# Patient Record
Sex: Female | Born: 1935 | Race: White | Hispanic: No | Smoking: Never smoker
Health system: Southern US, Community
[De-identification: ages and names within clinical notes are randomized; demographics above are authoritative.]

## PROBLEM LIST (undated history)

## (undated) DIAGNOSIS — J449 Chronic obstructive pulmonary disease, unspecified: Secondary | ICD-10-CM

## (undated) DIAGNOSIS — R011 Cardiac murmur, unspecified: Secondary | ICD-10-CM

## (undated) DIAGNOSIS — J4489 Other specified chronic obstructive pulmonary disease: Secondary | ICD-10-CM

## (undated) DIAGNOSIS — I341 Nonrheumatic mitral (valve) prolapse: Secondary | ICD-10-CM

## (undated) DIAGNOSIS — E039 Hypothyroidism, unspecified: Secondary | ICD-10-CM

## (undated) DIAGNOSIS — J45909 Unspecified asthma, uncomplicated: Secondary | ICD-10-CM

## (undated) DIAGNOSIS — I639 Cerebral infarction, unspecified: Secondary | ICD-10-CM

## (undated) DIAGNOSIS — M1990 Unspecified osteoarthritis, unspecified site: Secondary | ICD-10-CM

## (undated) DIAGNOSIS — R112 Nausea with vomiting, unspecified: Secondary | ICD-10-CM

## (undated) DIAGNOSIS — E785 Hyperlipidemia, unspecified: Secondary | ICD-10-CM

## (undated) DIAGNOSIS — R350 Frequency of micturition: Secondary | ICD-10-CM

## (undated) DIAGNOSIS — I350 Nonrheumatic aortic (valve) stenosis: Secondary | ICD-10-CM

## (undated) DIAGNOSIS — Z8489 Family history of other specified conditions: Secondary | ICD-10-CM

## (undated) DIAGNOSIS — G43909 Migraine, unspecified, not intractable, without status migrainosus: Secondary | ICD-10-CM

## (undated) DIAGNOSIS — Z9889 Other specified postprocedural states: Secondary | ICD-10-CM

## (undated) DIAGNOSIS — I447 Left bundle-branch block, unspecified: Secondary | ICD-10-CM

## (undated) DIAGNOSIS — I2510 Atherosclerotic heart disease of native coronary artery without angina pectoris: Secondary | ICD-10-CM

## (undated) DIAGNOSIS — G309 Alzheimer's disease, unspecified: Secondary | ICD-10-CM

## (undated) DIAGNOSIS — D649 Anemia, unspecified: Secondary | ICD-10-CM

## (undated) DIAGNOSIS — I10 Essential (primary) hypertension: Secondary | ICD-10-CM

## (undated) DIAGNOSIS — I455 Other specified heart block: Secondary | ICD-10-CM

## (undated) DIAGNOSIS — F0280 Dementia in other diseases classified elsewhere without behavioral disturbance: Secondary | ICD-10-CM

## (undated) DIAGNOSIS — I441 Atrioventricular block, second degree: Secondary | ICD-10-CM

## (undated) DIAGNOSIS — R0602 Shortness of breath: Secondary | ICD-10-CM

## (undated) DIAGNOSIS — Z950 Presence of cardiac pacemaker: Secondary | ICD-10-CM

## (undated) DIAGNOSIS — I714 Abdominal aortic aneurysm, without rupture: Secondary | ICD-10-CM

## (undated) HISTORY — DX: Atrioventricular block, second degree: I44.1

## (undated) HISTORY — PX: CATARACT EXTRACTION W/ INTRAOCULAR LENS IMPLANT: SHX1309

## (undated) HISTORY — DX: Nonrheumatic aortic (valve) stenosis: I35.0

## (undated) HISTORY — DX: Abdominal aortic aneurysm, without rupture: I71.4

## (undated) HISTORY — PX: REFRACTIVE SURGERY: SHX103

## (undated) HISTORY — DX: Left bundle-branch block, unspecified: I44.7

## (undated) HISTORY — DX: Other specified heart block: I45.5

## (undated) HISTORY — PX: CARDIAC CATHETERIZATION: SHX172

## (undated) HISTORY — DX: Hyperlipidemia, unspecified: E78.5

## (undated) HISTORY — PX: APPENDECTOMY: SHX54

## (undated) HISTORY — PX: UTERINE FIBROID SURGERY: SHX826

---

## 1975-10-26 HISTORY — PX: ABDOMINAL HYSTERECTOMY: SHX81

## 1992-10-25 HISTORY — PX: RETINAL DETACHMENT SURGERY: SHX105

## 1998-03-21 ENCOUNTER — Ambulatory Visit (HOSPITAL_COMMUNITY): Admission: RE | Admit: 1998-03-21 | Discharge: 1998-03-21 | Payer: Self-pay | Admitting: Ophthalmology

## 1998-06-12 ENCOUNTER — Encounter: Admission: RE | Admit: 1998-06-12 | Discharge: 1998-09-10 | Payer: Self-pay | Admitting: Family Medicine

## 1999-05-12 ENCOUNTER — Encounter: Payer: Self-pay | Admitting: Family Medicine

## 1999-05-12 ENCOUNTER — Ambulatory Visit (HOSPITAL_COMMUNITY): Admission: RE | Admit: 1999-05-12 | Discharge: 1999-05-12 | Payer: Self-pay | Admitting: Family Medicine

## 1999-05-15 ENCOUNTER — Emergency Department (HOSPITAL_COMMUNITY): Admission: EM | Admit: 1999-05-15 | Discharge: 1999-05-15 | Payer: Self-pay | Admitting: Emergency Medicine

## 1999-05-15 ENCOUNTER — Encounter: Payer: Self-pay | Admitting: Emergency Medicine

## 1999-09-16 ENCOUNTER — Encounter: Payer: Self-pay | Admitting: Family Medicine

## 1999-09-16 ENCOUNTER — Encounter: Admission: RE | Admit: 1999-09-16 | Discharge: 1999-09-16 | Payer: Self-pay | Admitting: Family Medicine

## 2000-11-10 ENCOUNTER — Encounter: Payer: Self-pay | Admitting: Family Medicine

## 2000-11-10 ENCOUNTER — Encounter: Admission: RE | Admit: 2000-11-10 | Discharge: 2000-11-10 | Payer: Self-pay | Admitting: Family Medicine

## 2000-11-14 ENCOUNTER — Encounter: Payer: Self-pay | Admitting: Family Medicine

## 2000-11-14 ENCOUNTER — Encounter: Admission: RE | Admit: 2000-11-14 | Discharge: 2000-11-14 | Payer: Self-pay | Admitting: Family Medicine

## 2001-11-10 ENCOUNTER — Encounter: Admission: RE | Admit: 2001-11-10 | Discharge: 2001-11-10 | Payer: Self-pay | Admitting: Gynecology

## 2001-11-10 ENCOUNTER — Encounter: Payer: Self-pay | Admitting: Gynecology

## 2004-10-20 ENCOUNTER — Emergency Department (HOSPITAL_COMMUNITY): Admission: EM | Admit: 2004-10-20 | Discharge: 2004-10-20 | Payer: Self-pay | Admitting: Emergency Medicine

## 2004-11-30 ENCOUNTER — Ambulatory Visit: Payer: Self-pay | Admitting: Gastroenterology

## 2004-12-14 ENCOUNTER — Ambulatory Visit: Payer: Self-pay | Admitting: Gastroenterology

## 2005-12-15 ENCOUNTER — Encounter: Admission: RE | Admit: 2005-12-15 | Discharge: 2005-12-15 | Payer: Self-pay | Admitting: Family Medicine

## 2006-06-14 ENCOUNTER — Encounter: Admission: RE | Admit: 2006-06-14 | Discharge: 2006-06-14 | Payer: Self-pay | Admitting: Family Medicine

## 2006-12-11 ENCOUNTER — Emergency Department (HOSPITAL_COMMUNITY): Admission: EM | Admit: 2006-12-11 | Discharge: 2006-12-11 | Payer: Self-pay | Admitting: Emergency Medicine

## 2006-12-22 ENCOUNTER — Ambulatory Visit: Payer: Self-pay | Admitting: Vascular Surgery

## 2008-02-23 ENCOUNTER — Encounter: Admission: RE | Admit: 2008-02-23 | Discharge: 2008-02-23 | Payer: Self-pay | Admitting: Family Medicine

## 2008-03-20 ENCOUNTER — Encounter: Admission: RE | Admit: 2008-03-20 | Discharge: 2008-03-20 | Payer: Self-pay | Admitting: Cardiology

## 2008-03-25 DIAGNOSIS — I639 Cerebral infarction, unspecified: Secondary | ICD-10-CM

## 2008-03-25 HISTORY — DX: Cerebral infarction, unspecified: I63.9

## 2008-03-26 ENCOUNTER — Inpatient Hospital Stay (HOSPITAL_COMMUNITY): Admission: AD | Admit: 2008-03-26 | Discharge: 2008-04-03 | Payer: Self-pay | Admitting: Cardiology

## 2008-03-26 ENCOUNTER — Ambulatory Visit: Payer: Self-pay | Admitting: Thoracic Surgery (Cardiothoracic Vascular Surgery)

## 2008-03-27 ENCOUNTER — Encounter: Payer: Self-pay | Admitting: Thoracic Surgery (Cardiothoracic Vascular Surgery)

## 2008-03-29 ENCOUNTER — Encounter: Payer: Self-pay | Admitting: Thoracic Surgery (Cardiothoracic Vascular Surgery)

## 2008-03-29 DIAGNOSIS — I350 Nonrheumatic aortic (valve) stenosis: Secondary | ICD-10-CM

## 2008-03-29 HISTORY — PX: AORTIC VALVE REPLACEMENT: SHX41

## 2008-03-29 HISTORY — DX: Nonrheumatic aortic (valve) stenosis: I35.0

## 2008-03-29 HISTORY — PX: CORONARY ARTERY BYPASS GRAFT: SHX141

## 2008-04-10 ENCOUNTER — Encounter (INDEPENDENT_AMBULATORY_CARE_PROVIDER_SITE_OTHER): Payer: Self-pay | Admitting: Internal Medicine

## 2008-04-10 ENCOUNTER — Inpatient Hospital Stay (HOSPITAL_COMMUNITY): Admission: EM | Admit: 2008-04-10 | Discharge: 2008-04-15 | Payer: Self-pay | Admitting: Emergency Medicine

## 2008-04-12 ENCOUNTER — Encounter: Payer: Self-pay | Admitting: Cardiovascular Disease

## 2008-04-25 ENCOUNTER — Ambulatory Visit: Payer: Self-pay | Admitting: Cardiothoracic Surgery

## 2008-04-28 ENCOUNTER — Inpatient Hospital Stay (HOSPITAL_COMMUNITY): Admission: EM | Admit: 2008-04-28 | Discharge: 2008-04-29 | Payer: Self-pay | Admitting: Emergency Medicine

## 2008-04-28 ENCOUNTER — Ambulatory Visit: Payer: Self-pay | Admitting: Surgery

## 2008-05-13 ENCOUNTER — Encounter
Admission: RE | Admit: 2008-05-13 | Discharge: 2008-05-13 | Payer: Self-pay | Admitting: Thoracic Surgery (Cardiothoracic Vascular Surgery)

## 2008-05-13 ENCOUNTER — Ambulatory Visit: Payer: Self-pay | Admitting: Thoracic Surgery (Cardiothoracic Vascular Surgery)

## 2008-06-03 ENCOUNTER — Ambulatory Visit: Payer: Self-pay | Admitting: Thoracic Surgery (Cardiothoracic Vascular Surgery)

## 2008-06-03 ENCOUNTER — Encounter
Admission: RE | Admit: 2008-06-03 | Discharge: 2008-06-03 | Payer: Self-pay | Admitting: Thoracic Surgery (Cardiothoracic Vascular Surgery)

## 2008-06-10 ENCOUNTER — Ambulatory Visit (HOSPITAL_COMMUNITY)
Admission: RE | Admit: 2008-06-10 | Discharge: 2008-06-10 | Payer: Self-pay | Admitting: Thoracic Surgery (Cardiothoracic Vascular Surgery)

## 2008-06-13 ENCOUNTER — Encounter (HOSPITAL_COMMUNITY): Admission: RE | Admit: 2008-06-13 | Discharge: 2008-09-18 | Payer: Self-pay | Admitting: Cardiology

## 2008-06-26 ENCOUNTER — Encounter: Admission: RE | Admit: 2008-06-26 | Discharge: 2008-06-26 | Payer: Self-pay | Admitting: Cardiology

## 2008-08-05 ENCOUNTER — Ambulatory Visit: Payer: Self-pay | Admitting: Thoracic Surgery (Cardiothoracic Vascular Surgery)

## 2008-08-05 ENCOUNTER — Encounter
Admission: RE | Admit: 2008-08-05 | Discharge: 2008-08-05 | Payer: Self-pay | Admitting: Thoracic Surgery (Cardiothoracic Vascular Surgery)

## 2008-09-19 ENCOUNTER — Encounter (HOSPITAL_COMMUNITY): Admission: RE | Admit: 2008-09-19 | Discharge: 2008-10-07 | Payer: Self-pay | Admitting: Cardiology

## 2009-12-10 IMAGING — CR DG LUMBAR SPINE COMPLETE 4+V
5 series · 5 of 5 positions shown · non-contrast
Comparison: None

CLINICAL DATA: Right leg pain

LUMBAR SPINE - COMPLETE 4+ VIEW

[view not recorded (1 of 5)]
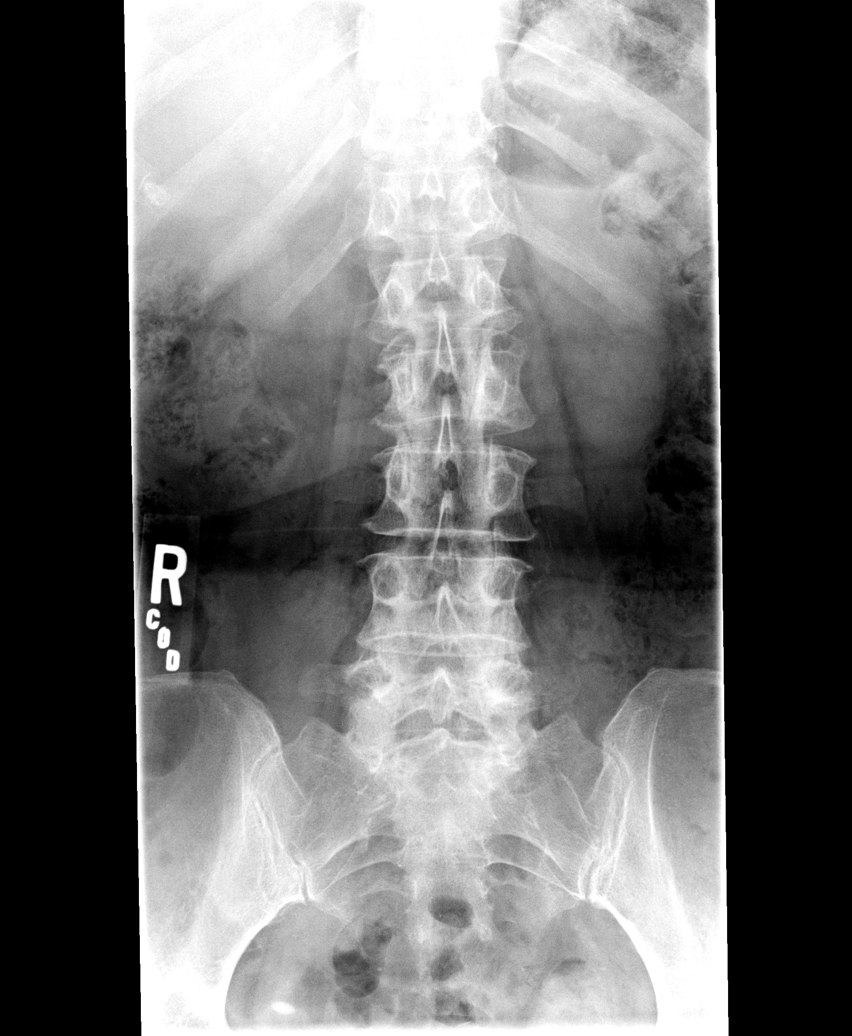

[view not recorded (2 of 5)]
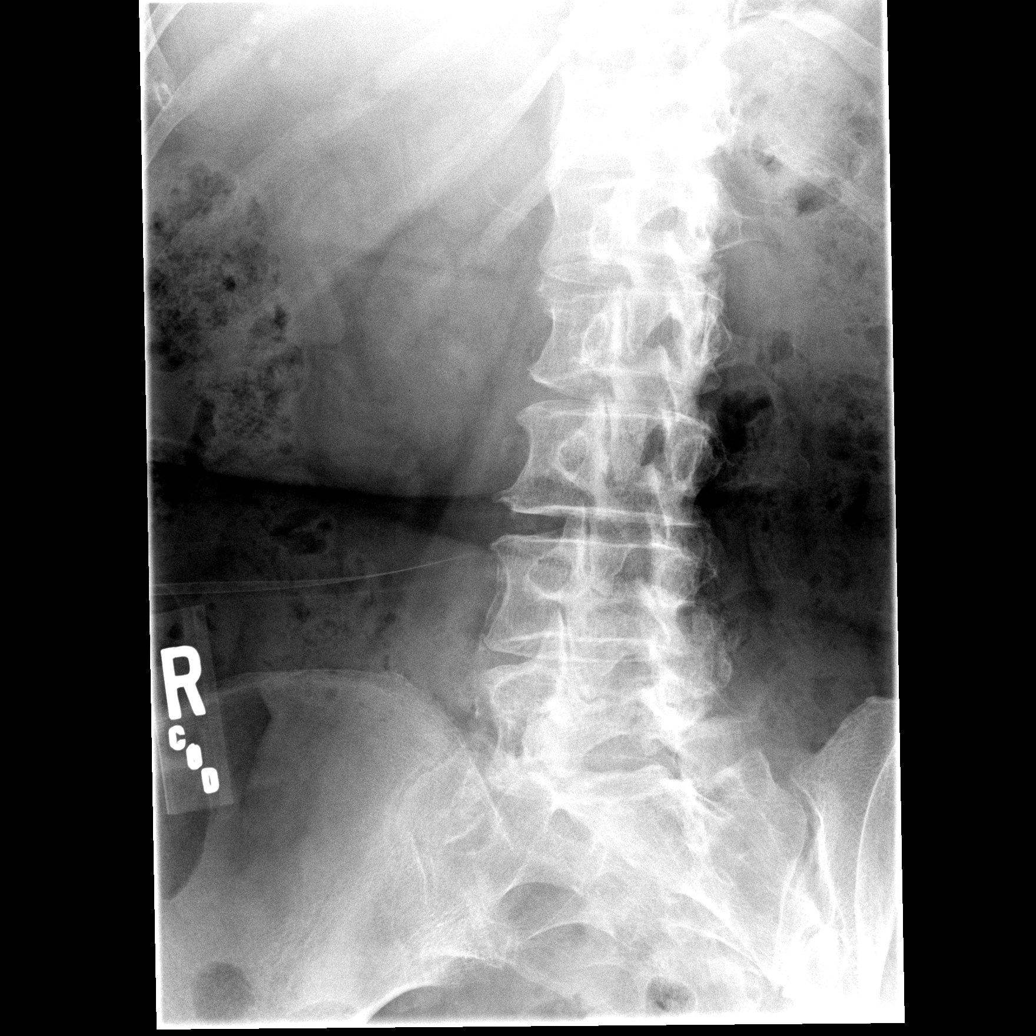

[view not recorded (3 of 5)]
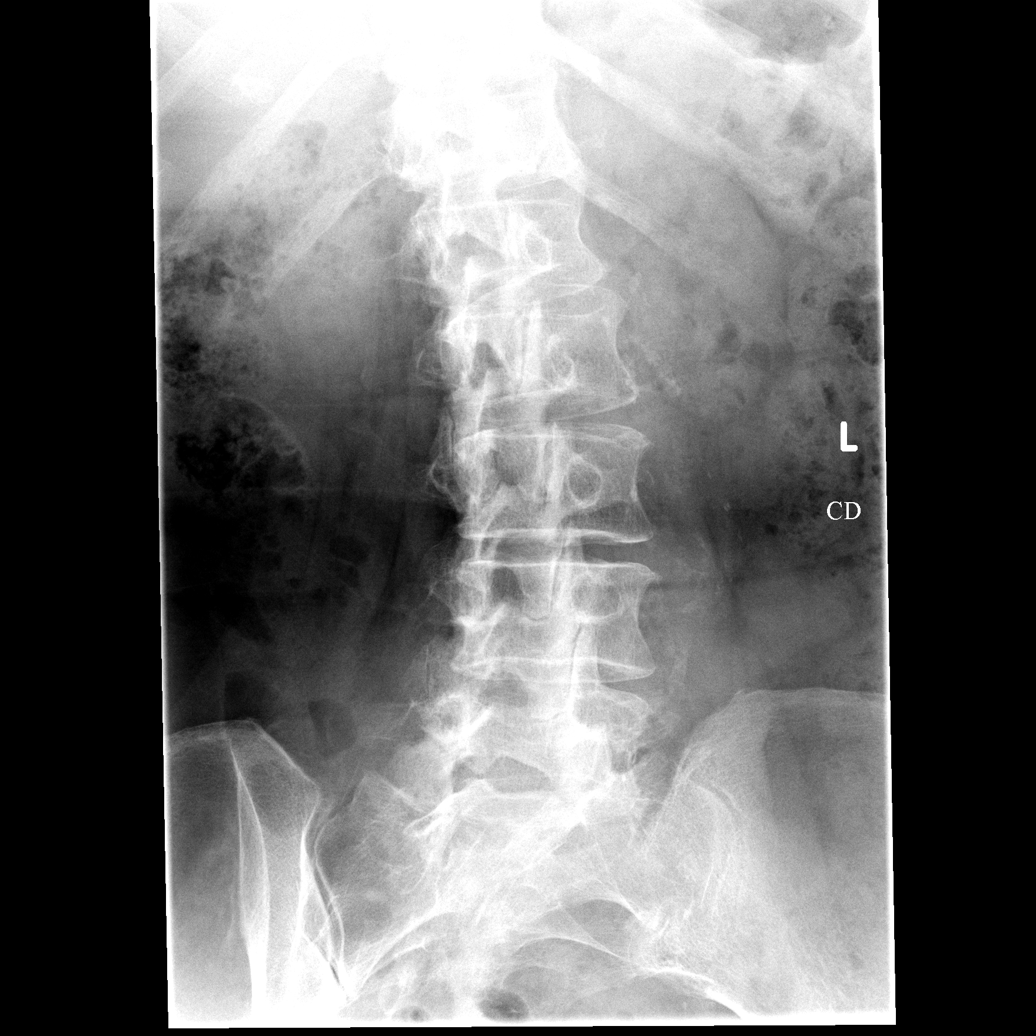

[view not recorded (4 of 5)]
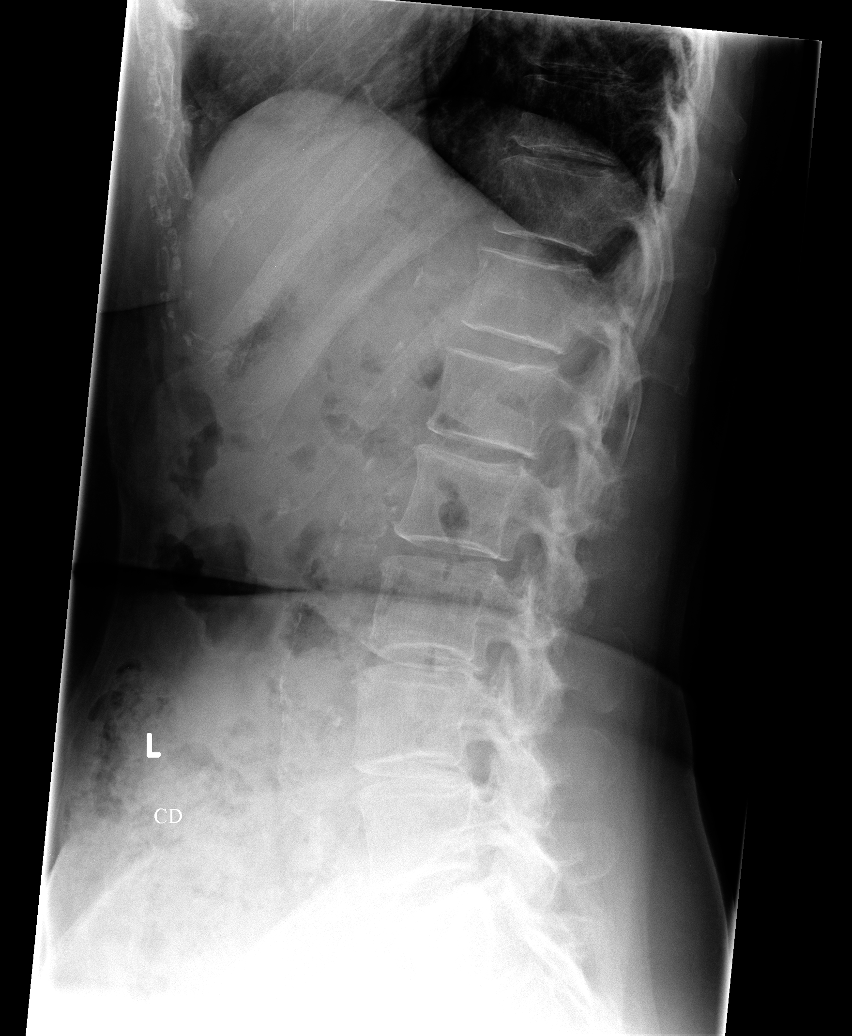

[view not recorded (5 of 5)]
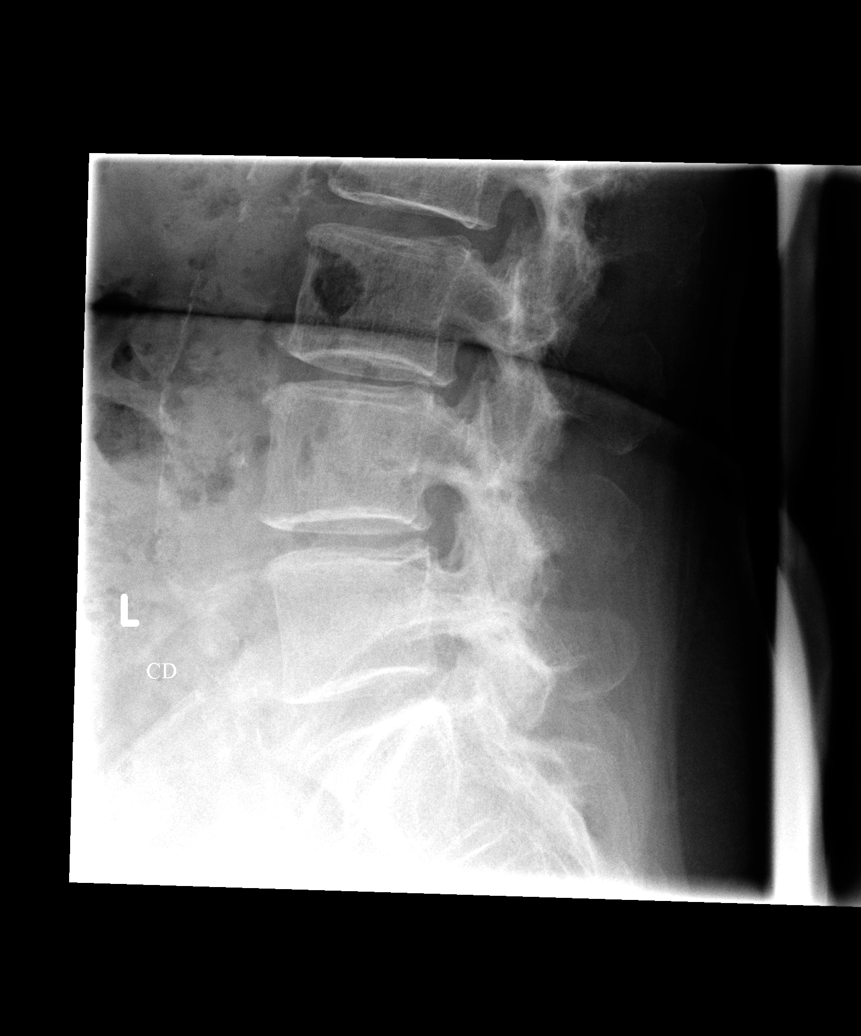

[5 of 5 positions shown; findings below may reference images not displayed]

FINDINGS: No evidence for fracture.  No subluxation or dislocation.
Loss of disc height is seen diffusely in the lumbar spine.  There
is facet osteoarthritis in the lower lumbar spine.  There is
prominent calcification is seen in the wall of the abdominal aorta
and on the lateral film, this measures up to 2.9 cm in diameter .
The SI joints and visualized sacrum are unremarkable.
IMPRESSION: Degenerative disc and facet disease in the lumbar spine.

## 2010-04-17 ENCOUNTER — Encounter: Admission: RE | Admit: 2010-04-17 | Discharge: 2010-04-17 | Payer: Self-pay | Admitting: Emergency Medicine

## 2010-06-25 HISTORY — PX: HIP FRACTURE SURGERY: SHX118

## 2011-03-09 NOTE — Assessment & Plan Note (Signed)
OFFICE VISIT   MANDA, HOLSTAD  DOB:  1934/06/19                                        August 05, 2008  CHART #:  [id number]   HISTORY OF PRESENT ILLNESS:  The patient returns for further followup  status post aortic valve replacement and coronary artery bypass grafting  from March 29, 2008.  Her postoperative recovery was notable for episodes  of paroxysmal atrial fibrillation as well as recurrent left pleural  effusion requiring needle thoracentesis.  More recently, she has been  doing quite well and she returns to our office for routine followup  today.  She was last seen here in the office on June 03, 2008.  Since  then, she has continued to improve overall.  She states that she did  have an upper respiratory tract infection over the last few weeks, but  this has resolved.  She had a cough associated with this infection, but  this has resolved.  She denies any shortness of breath.  She has no  significant pain other than some itching and hypersensitivity along her  sternal incision.  Her appetite is good.  Activity level is good.  She  has no other complaints.  She denies any history of tachy palpitations  or dizziness recently.  The remainder of her past medical history is  unchanged.  The remainder of her review of systems is unrevealing.   CURRENT MEDICATIONS:  Coumadin, aspirin, Flovent inhaler, albuterol  inhaler, metoprolol, Levoxyl, simvastatin, iron supplement, calcium  supplement with vitamin D, and Azopt eye drops.   PHYSICAL EXAMINATION:  GENERAL:  Notable for well-appearing female.  VITAL SIGNS:  Blood pressure 155/64, pulse 51 and regular, and oxygen  saturation is 99% on room air.  CHEST:  Reveals a median sternotomy scar that is essentially healed  completely.  The sternum is stable on palpation and nontender.  Auscultation of the chest demonstrates clear breath sounds, which are  symmetrical bilaterally.  No wheezes or rhonchi are  noted.  CARDIOVASCULAR:  Regular rate and rhythm.  ABDOMEN:  Soft and nontender.  EXTREMITIES:  Warm and well perfused.  There is no lower extremity  edema.   DIAGNOSTIC TEST:  Chest x-ray obtained today at the [facility] is reviewed and compared with last previous film from August.  This demonstrates trivial residual left pleural effusion.  Lung fields  are otherwise clear.  No other abnormalities are noted.   IMPRESSION:  Satisfactory progress now 4 months status post aortic valve  replacement and coronary artery bypass grafting   PLAN:  I have encouraged the patient to continue to increase her  activity as tolerated without any specific limitations at this point in  time.  We will leave this discretion with Dr. Elsie Lincoln decision regarding  stopping Coumadin, although this could probably be stopped at any point  as long as she continues to do well with no signs of further atrial  arrhythmias.  All of her questions have been addressed.  In the future,  she will call or return to see [place] here at [facility] only should further problems or difficulties arise.   Salvatore Decent. Cornelius Moras, M.D.  Electronically Signed   CHO/MEDQ  D:  08/05/2008  T:  08/05/2008  Job:  [id number]   cc:   Madaline Savage,  M.D.  Mosetta Putt, M.D.

## 2011-03-09 NOTE — Assessment & Plan Note (Signed)
OFFICE VISIT   [name], [name]  DOB:  10/25/1934                                        May 13, 2008  CHART #:  [id number]   The patient comes in today for followup.  She previously saw Dr.  Tyrone Sage on 04/25/2008 with complaints of significant shortness of  breath.  At that time she was noted on chest x-ray to have an increasing  left pleural effusion.  However at the time, her protime was 27.4 with  an INR of 4.1, and it was felt that she should hold her Coumadin in  order to proceed with an ultrasound-guided thoracentesis.  This was  scheduled to be performed on 04/29/2008; however in the interim, the  patient developed worsening shortness of breath and required a visit to  the emergency department for evaluation.  At that time her INR was down  to 1.3 according to her daughter and they did proceed with a left  thoracentesis.  She has remained stable since that time and her  shortness of breath has greatly improved.  She is walking 30 minutes a  day at least and is having no significant pain requiring pain  medication.  She overall is feeling much better.  She is back on her  Coumadin at 2.5 mg a day and on Friday her INR was 1.5.   PHYSICAL EXAMINATION:  VITALS SIGNS:  Blood pressure is 148/72, pulse is  58, respirations 18, O2 sat 98%-99% on room air.  Her sternal incision  is well-healed.  HEART:  Regular rate and rhythm without murmurs, rubs or gallops.  LUNGS:  Clear although it is slightly diminished in the bases  bilaterally.  INCISION:  Leg incisions are well healed and she has trace lower  extremity edema, right greater than left.  She did not have a chest x-  ray today.   ASSESSMENT/PLAN:  The patient is progressing well status post aortic  valve replacement and coronary artery bypass grafting on June 02, by Dr.  Cornelius Moras.  She is greatly improved symptomatically following her  thoracentesis.  We will have her obtain a chest x-ray this  afternoon at  [facility] just to follow up her effusion.  She will continue  her Coumadin as directed by Dr. Truett Perna office.  Since she is doing  well and is not on pain medication at this time, she is released to  begin driving and gradually ease back into her normal activity.  She  does have a followup appointment scheduled in 3 weeks with Dr. Cornelius Moras as  well as one in 1-2 weeks  with Dr. Truett Perna office.  She is asked to call in the interim if she  experiences problems or has questions.   Salvatore Decent. Cornelius Moras, M.D.  Electronically Signed   GC/MEDQ  D:  05/13/2008  T:  05/14/2008  Job:  [id number]   cc:   Madaline Savage, M.D.

## 2011-03-09 NOTE — Assessment & Plan Note (Signed)
OFFICE VISIT   STEPAHNIE, [name]  DOB:  09-24-1934                                        June 03, 2008  CHART #:  [id number]   HISTORY OF PRESENT ILLNESS:  The patient returns for further followup  status post aortic valve replacement and coronary artery bypass grafting  x3 on March 29, 2008.  The patient's postoperative recovery had been  somewhat slow, but recently she has continued to improve.  She was last  seen here in the office on May 13, 2008, by one of my partners for  followup related to a moderate-sized left pleural effusion.  She was  given a prescription for Medrol Dosepak at that time.  Overall, the  patient reports that she has continued to slowly improve.  She still has  some exertional shortness of breath, and she does not feel that the  Medrol Dosepak made any difference at all.  She was seen in followup  last week by Dr. Elsie Lincoln, although she has still not yet started the  cardiac rehabilitation program.  She has not had any more tachy  palpitations to suggest any recurrent atrial arrhythmias.  She has not  had any transient visual disturbances or other signs or symptoms to  suggest a possible transient ischemic attack.  Her appetite is good.  Her activity level is fairly good overall.  She is back driving an  automobile.  She has minimal residual soreness.  She does still complain  of some exertional shortness of breath.   CURRENT MEDICATIONS:  Coumadin, aspirin, Flovent inhaler, metoprolol,  Levoxyl, simvastatin, Azopt ointment, and iron sulfate.   PHYSICAL EXAMINATION:  GENERAL:  Notable for a well-appearing female.  VITAL SIGNS:  Blood pressure 145/69, pulse 56 and regular, and oxygen  saturation 98% on room air.  CHEST:  Median sternotomy incision that has healed nicely.  The sternum  is stable on palpation.  Auscultation of the chest reveals slightly  diminished breath sounds at the left lung base.  Breath sounds are  otherwise  clear.  CARDIOVASCULAR:  Regular rate and rhythm.  ABDOMEN:  Soft and nontender.  EXTREMITIES:  Warm and well perfused.  There is no lower extremity  edema.   The remainder of her physical exam is noncontributory.   DIAGNOSTIC TESTS:  Chest x-ray obtained today at the [facility] is reviewed and compared with the last previous film from May 13, 2008.  Today's film demonstrates no change in the moderate-sized  left pleural effusion.  This has not gotten any bigger, but it has also  not gotten any smaller.  This is associated with some slight elevation  of the left hemidiaphragm which persists.   IMPRESSION:  Overall, the patient continues to slowly improve, and she  seems to be doing fairly well.  She does have a moderate-sized left  pleural effusion and associated left lower lobe atelectasis with some  associated exertional shortness of breath.  I suspect that there may be  enough fluid that another thoracentesis might provide symptomatic  improvement.  She otherwise continues to do well, and I think she is  ready to start the cardiac rehabilitation program.   PLAN:  We will schedule the patient for ultrasound-guided needle  thoracentesis.  We will have her stop her Coumadin for several days  prior to this  intervention.  She will resume the Coumadin after the  thoracentesis has been completed.  We will plan to see her back in 8  weeks with another followup chest x-ray at that time.  All of her  questions have been addressed.   Salvatore Decent. Cornelius Moras, M.D.  Electronically Signed   CHO/MEDQ  D:  06/03/2008  T:  06/03/2008  Job:  [id number]   cc:   Madaline Savage, M.D.

## 2011-03-09 NOTE — Consult Note (Signed)
[name], [name]              ACCOUNT NO.:  [id number]   MEDICAL RECORD NO.:  [id number]          PATIENT TYPE:  INP   LOCATION:  2039                         FACILITY:  [facility]   PHYSICIAN:  Salvatore Decent. Cornelius Moras, M.D. DATE OF BIRTH:  Sep 22, 1934   DATE OF CONSULTATION:  DATE OF DISCHARGE:                                 CONSULTATION   REQUESTING PHYSICIAN:  Madaline Savage, MD.   PRIMARY CARE PHYSICIAN:  Mosetta Putt, MD.   REASON FOR CONSULTATION:  Severe aortic stenosis with left main disease  and 3-vessel coronary artery disease.   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  The patient  has long-standing history of heart murmur that dates back to age 30 when  it was noted on routine physical exam.  In recent year, she has been  followed carefully by Dr. Mosetta Putt.  Over the last 2 years, she  has complained of progressive exertional fatigue as well as exertional  shortness of breath and occasional episodes of shortness of breath with  tightness in her chest.  These symptoms have all gradually progressed.  The patient denies any episodes of shortness of breath or chest  discomfort at rest.  She denies nocturnal symptoms.  She reports no PND,  orthopnea, or lower extremity edema.  She did suffer a syncopal episode  several years ago, and no particular explanation was identified for her  syncope at that time.  She underwent routine follow up echocardiogram in  March of this year.  By report, an echocardiogram performed on January 10, 2008, at [facility] was notable for the  presence of normal left ventricular size and function, but severe aortic  stenosis with mild aortic insufficiency.  The peak velocity across the  valve was measured 373 cm/sec corresponding to peak and mean  transvalvular gradients of 56 and 32 mmHg, respectively.  The aortic  valve area was estimated 0.76 cm squared.  Other findings on the  echocardiogram were fairly insignificant including only mild mitral  regurgitation and mild tricuspid regurgitation.  Specific diameter of  the aortic root was not reported, although the aortic root was felt to  be normal in size.  The patient was subsequently scheduled for elective  cardiac catheterization which was performed today by Dr. Elsie Lincoln.  This  confirmed the presence of severe aortic stenosis as well as severe left  main disease and 3-vessel coronary artery disease.  The valve area was  estimated 1.96 cm squared.  Left ventricular systolic function was  normal.  Coronary arteriography revealed 70% left main stenosis with 95%-  99% ostial stenosis of the left circumflex coronary artery involving  bifurcation with a very large first circumflex marginal branch and left  dominant coronary circulation.  The right coronary artery was small and  nondominant.  Cardiothoracic surgical consultation has been requested.   REVIEW OF SYSTEMS:  GENERAL:  The patient complains of 2-3 year  history  of progressive exertional fatigue.  She has normal appetite.  She has  not been gaining nor losing weight recently.  CARDIAC:  Notable for  progressive exertional shortness of breath as well as occasional  episodes of exertional chest tightness.  The patient denies symptoms at  rest.  The patient has not had lower extremity edema nor palpitations.  She had one syncopal episode several years ago.  RESPIRATORY:  Notable  for reported long-standing history of asthma over the last 6 or 7 years  that according to the patient has progressed.  The patient has no  history of tobacco use and no history of bronchitis or pneumonia.  The  patient denies any recent productive cough, hemoptysis, or wheezing.  GASTROINTESTINAL:  Negative.  The patient has no difficulty swallowing.   She denies symptoms of reflux.  She reports no hematochezia,  hematemesis, nor melena.  GENITOURINARY:  Notable for some stress  urinary incontinence which is stable.  MUSCULOSKELETAL:  Notable for  some chronic pain in the right knee and right lower leg which does limit  her ambulation somewhat.  NEUROLOGIC:  Negative.  The patient denies  symptoms suggestive of previous TIA or stroke.  HEENT:  Negative.  INFECTIOUS:  Negative.   PAST MEDICAL HISTORY:  1. Aortic stenosis.  2. Hypertension.  3. Hyperlipidemia.  4. Hypothyroidism.  5. Asthma.  6. Small infrarenal abdominal aortic aneurysm (reportedly 3.5 cm in      diameter).  7. Rheumatic fever as a child.   PAST SURGICAL HISTORY:  1. Removal of uterine fibroids.  2. Hysterectomy.  3. Multiple surgeries on left eye including cataract extraction,      repair of detached retina, and multiple other laser surgeries.   SOCIAL HISTORY:  The patient is divorced and lives alone here in  [place].  She has one grown child who is very supportive and numerous  other family members who are nearby and supportive.  The patient is a  nonsmoker and she denies alcohol consumption.   MEDICATIONS PRIOR TO ADMISSION:  1. Zocor 20 mg daily.  2. Levoxyl 50 mcg daily.  3. Flovent inhaler twice daily.  4. Lisinopril 10 mg daily.  5. Triamterene/hydrochlorothiazide 37.5/25 one tablet daily.  6. Potassium chloride 10 mEq daily.  7. Albuterol inhaler as needed.  8. Nabumetone as needed for pain.   DRUG ALLERGIES:  None known.  The patient reports nausea and vomiting  with CODEINE administration.   PHYSICAL EXAM:  GENERAL:  The patient is a well-appearing female who  appears her stated age in no acute distress.  VITAL SIGNS:  Blood pressure 127/69, pulse 68, normal sinus rhythm on  telemetry monitor, and oxygen saturation 95% on room air.  HEENT:  Unrevealing.  NECK:  Supple.  There are no carotid bruits.  Auscultation of the chest  reveals  clear breath sounds which are symmetrical bilaterally.  No  wheezes or rhonchi are noted.  CARDIOVASCULAR:  Reveals regular rate and rhythm.  There is very  prominent grade 4/6 systolic murmur heard best along the sternal border  with radiation towards the neck and across the precordium.  No diastolic  murmurs are noted.  ABDOMEN:  Soft and nontender.  Bowel sounds are present.  EXTREMITIES:  Warm and well perfused.  There is no lower extremity  edema.  Distal pulses are palpable in the posterior tibial and dorsalis  pedis position.  There is no sign of significant venous insufficiency.  SKIN:  Healthy and clean  and intact throughout.  RECTAL AND GU EXAMS:  Both deferred.  NEUROLOGIC:  Grossly nonfocal and symmetrical throughout.   DIAGNOSTIC TESTS:  Cardiac catheterization performed by Dr. Elsie Lincoln today  is reviewed.  This demonstrates left main disease with 70% distal left  main stenosis, left dominant coronary circulation, and critical 3-vessel  coronary artery disease.  Catheterization also confirms the presence of  severe aortic stenosis.  There is normal left ventricular systolic  function.   IMPRESSION:  Severe aortic stenosis with left main disease and severe 3-  vessel coronary artery disease, progressive symptoms of exertional  shortness of breath and chest tightness.  I believe that Ms. Hunke  would best be treated with elective aortic valve replacement and  coronary artery bypass grafting.   PLAN:  I have discussed the options at length with Ms. Brookshire and her  family.  Alternative treatment strategies have been reviewed.  They  understand and accept all associated risks of surgery including but not  limited to risk of death, stroke, myocardial infarction, congestive  heart failure, respiratory failure, pneumonia, bleeding requiring blood  transfusion, arrhythmia, heart block with bradycardia requiring  permanent pacemaker, late recurrence of coronary artery disease,  and  late recurrence of congestive heart failure.  We have also discussed the  relative risks and benefits of use of a mechanical prosthesis versus use  of a bioprosthetic tissue valve at the time of valve replacement.  The  patient desires that a bioprosthetic tissue valve be utilized in an  effort to avoid need for long-term anticoagulation.  She understands  that there is a small associated risk of late structural valve  deterioration and failure, but given her age it would be relatively  unlikely that she will experience this during her lifetime.  All of  their questions have been addressed.  We tentatively plan to proceed  with surgery later this week as soon as schedule will permit.      Salvatore Decent. Cornelius Moras, M.D.  Electronically Signed     CHO/MEDQ  D:  03/26/2008  T:  03/27/2008  Job:  [id number]   cc:   Madaline Savage, M.D.  Mosetta Putt, M.D.

## 2011-03-09 NOTE — H&P (Signed)
[name], Stanton              ACCOUNT NO.:  [id number]   MEDICAL RECORD NO.:  [id number]          PATIENT TYPE:  INP   LOCATION:  3737                         FACILITY:  [facility]   PHYSICIAN:  Lonia Blood, M.D.      DATE OF BIRTH:  [birth year]   DATE OF ADMISSION:  04/09/2008  DATE OF DISCHARGE:                              HISTORY & PHYSICAL   PRIMARY CARE PHYSICIAN:  Mosetta Putt, M.D.   CARDIOLOGIST:  Madaline Savage, M.D.   CARDIOVASCULAR SURGEON:  Dr. Purcell Nails.   PRESENTING COMPLAINT:  Loss of vision in the right eye, transient.   HISTORY OF PRESENT ILLNESS:  The patient is a 77 year old female with  severe vasculopathy, status post recent CABG and aortic valve  replacement.  She was discharged 6 days ago.  The patient had severe  aortic stenosis prior to that and today she had 2 episodes of loss of  vision in her right eye, mainly involving the lower part of her eye.  In  both cases, it looked like a veil coming over.  She denied any  flickering of lights.  She denied any of the classic symptoms of retinal  detachment.  She has had prior history of retinal detachment on the left  eye which was reattached, and also had another eye surgery on the right  eye.  She denied any pain.  Her vision is now back to normal.  She uses  glasses but otherwise no further visual problems.   PAST MEDICAL HISTORY:  Significant for recent CABG, aortic valve  replacement, severe aortic stenosis, multivessel coronary artery disease  with 70% of the left main occluded, history of hypertension,  dyslipidemia, hypothyroidism, asthma, infrarenal abdominal aortic  aneurysm with a size of 3.5-cm, and history of rheumatic fever.   ALLERGIES:  She has no known drug allergies.   MEDICATIONS:  1. Lisinopril 10 mg daily.  2. Zocor 20 mg at night.  3. Levoxyl 50 mcg daily.  4. Flovent 110 mcg b.i.d.  5. Albuterol 90 mcg b.i.d.  6. Toprol XL 25 mg daily.  7. Ultram 50 mg 1-2 tablets  q.4-6 hours b.i.d.  8. Potassium chloride 10 mEq daily.  9. Triamterene and hydrochlorothiazide 37.5/25 one tablet daily.   SOCIAL HISTORY:  The patient lives at home with her daughter who is her  primary care giver.  Denied any active tobacco, alcohol, or IV drug use.   FAMILY HISTORY:  Noncontributory.   REVIEW OF SYSTEMS:  A 12-point review of systems is negative except per  HPI.   PHYSICAL EXAMINATION:  VITALS:  Temperature is 97.9, blood pressure  103/52, pulse 92, respiratory rate 20, and sats 99% on room air.  GENERAL:  The patient is pleasant, awake, alert, oriented in no acute  distress.  HEENT:  PERRL.  EOMI.  Neck:  Supple, no JVD, and no lymphadenopathy.  RESPIRATORY:  She has good air entry bilaterally.  No wheezes or rales.  CARDIOVASCULAR SYSTEM:  She has S1 and S2, no murmur.  ABDOMEN:  Soft, nontender with positive bowel sounds.  EXTREMITIES:  No edema, cyanosis,  or clubbing.  The patient's sternal  scar looks okay, clean, and healing well.   LABORATORY DATA:  White count is 9.2, hemoglobin 11.3, with platelet  count of 319 and normal differentials.  Sodium 135, potassium 4.6,  chloride 103, CO2 24, glucose 104, BUN 18, creatinine 1.27, and calcium  8.8.  PT 13.8, INR 1.0, PTT 31.  Head CT without contrast showed no  acute intracranial abnormality.  There was mild cerebral atrophy and  small vessel disease.   ASSESSMENT:  Therefore, this is a 77 year old female presenting with  what appeared to be amaurosis fugax.  Other possibility also seems to be  possible transient ischemic attack and possible retina related disease.  The transient nature of the symptoms seems to suggest vascular in  nature.   PLAN:  1. Transient visual loss versus transient ischemic attack.  We will      admit the patient and work her up if she has the TIA.  Keep her on      aspirin.  Get a 2D echo and carotid Doppler.  I have talked with      CVTS, Dr. Edwyna Shell.  We will consider MRI  unless otherwise      contraindicated.  The patient had a bovine aortic valve, so she      probably will be able to get MRI without a problem.  We will also      put her on some subcutaneous heparin for now and we will consult      Dr. Cornelius Moras in the morning for further details.  2. Recent coronary artery bypass grafting and aortic valve      replacement.  Again, we will get CVTS involved as soon as possible      so that they can monitor the patient's heart and the postoperative      care.  3. Dyslipidemia.  We will check fasting lipid panel and continue with      home medications.  4. Hypothyroidism.  I will check TSH and continue with her Synthroid.      Other medical problems seems to be stable, and we will continue      with home medication as necessary.      Lonia Blood, M.D.  Electronically Signed     Lonia Blood, M.D.  Electronically Signed    LG/MEDQ  D:  04/10/2008  T:  04/10/2008  Job:  [id number]

## 2011-03-09 NOTE — Discharge Summary (Signed)
[name], [name]              ACCOUNT NO.:  [id number]   MEDICAL RECORD NO.:  [id number]          PATIENT TYPE:  INP   LOCATION:  2028                         FACILITY:  [facility]   PHYSICIAN:  Evelene Croon, M.D.     DATE OF BIRTH:  [birth year]   DATE OF ADMISSION:  04/28/2008  DATE OF DISCHARGE:  04/29/2008                               DISCHARGE SUMMARY   HISTORY OF PRESENT ILLNESS:  The patient is a 77 year old female well-  known to [facility]  having recently undergone coronary artery bypass and  aortic valve replacement in early June 2009.  Recently, she was  hospitalized approximately 2 weeks prior to this admission with an  episode of a TIA and amaurosis fugax.  She was found to be in atrial  fibrillation.  She was started on Coumadin at that time.  Over the past  several weeks, she apparently has developed a left-sided pleural  effusion.  She was seen at the [facility] office on March 26, 2008, by Dr.  Tyrone Sage.  At that time, her INR was 4.1.  She was scheduled for an  elective thoracentesis in the office on April 29, 2008, however, on the  date of admission, April 28, 2008, she had an episode of approximately 45  minutes of palpitations.  Initially, she noted some increased shortness  of breath.  Her shortness of breath has been somewhat worsening over the  past week or so, but she has no orthopnea.  She has a mild nonproductive  cough.  She also noted some increasing fatigue.  She was seen in the ER  and found to be in normal sinus rhythm with occasional premature  ventricular contractions.  She was admitted to the hospital for further  telemetry observation as well as a thoracentesis.  Her INR on April 28, 2008, was 1.6.   PAST MEDICAL HISTORY:  1. Coronary artery disease.  2. Hypertension.  3. History of TIA and amaurosis fugax.  4. History of atrial fibrillation diagnosed approximately 2 weeks      prior to this admission.  5. Hypothyroidism.  6. History of rheumatic fever.  7.  History of left retinal detachment.  8. History of dyslipidemia.  9. History of small infrarenal abdominal aortic aneurysm.  10.History of asthma.  11.History of homocysteinemia.  12.History of anemia of chronic disease.   PAST SURGICAL HISTORY:  Includes coronary artery bypass grafting and  aortic valve replacement with a tissue valve as described above.  Other  surgical history includes history of resection of an uterine fibroid,  history of hysterectomy, and multiple left eye surgeries including  repair of a detached retina, cataract surgery as well as laser  surgeries.   ALLERGIES:  No known drug allergies, although she does have a GI  intolerance to CODEINE.   MEDICATIONS AT TIME OF ADMISSION:  Coumadin which has been on hold since  March 26, 2008.  It was dosed at 2.5 mg per day.   Other medications:  1. Aspirin 81 mg daily.  2. Flovent 2 puffs b.i.d  3. Albuterol metered-dose inhaler 2 puffs q.6 h.  4. Levoxyl 50 mcg daily.  5. Simvastatin 20 mg daily.  6. Multivitamin 1 daily.  7. Azopt eye drops 1 drop left eye b.i.d.  8. Iron sulfate 325 mg b.i.d.  9. Lopressor was recently by Dr. Elsie Lincoln at a followup appointment.      She was given a dose of 25 mg in the morning in the emergency      department.   Family history, social history, review of systems, and physical exam,  please see the history and physical done at time of admission.   HOSPITAL COURSE:  The patient was admitted for observation on telemetry.  She had no significant further cardiac dysrhythmias.  She underwent a  thoracentesis of the left side and this obtained 1200 mL of serous amber  fluid.  Following this, she was felt to be acceptable for resuming her  Coumadin.  She was seen in the next morning on rounds and felt to be  quite stable for discharge at that time.   MEDICATIONS ON DISCHARGE:  1. Aspirin 81 mg daily.  2. Flovent 110 mcg 2 puffs twice daily.  3. Albuterol metered-dose inhaler 2 puffs  every 6 hours p.r.n.  4. Metoprolol 25 mg twice daily.  5. Levoxyl 50 mcg daily.  6. Azopt 1 drop left eye b.i.d.  7. Simvastatin 20 mg at bedtime.  8. Iron sulfate 325 mg b.i.d.  9. Nabumetone 750 mg twice daily p.r.n.  10.Coumadin 1 mg daily.  11.Lopressor 25 mg twice daily.   INSTRUCTIONS:  The patient received written instructions regarding  medications, activity, diet, wound care, and followup.  Followup will  include an appointment to see Dr. Cornelius Moras on June 03, 2008, at 4:30 and  Dr. Elsie Lincoln on May 03, 2008, for Coumadin recheck.   FINAL DIAGNOSIS:  Possible paroxysmal atrial fibrillation versus  frequent premature ventricular contractions causing clinical  palpitations.  This has resolved with resumption of beta-blockade.   OTHER DIAGNOSES:  Large left pleural effusion status post thoracentesis.  Other diagnoses as previously listed per the history.      Rowe Clack, P.A.-C.      Evelene Croon, M.D.  Electronically Signed    WEG/MEDQ  D:  05/28/2008  T:  05/29/2008  Job:  [id number]

## 2011-03-09 NOTE — H&P (Signed)
[name], [name]              ACCOUNT NO.:  [id number]   MEDICAL RECORD NO.:  [id number]          PATIENT TYPE:  INP   LOCATION:  2028                         FACILITY:  [facility]   PHYSICIAN:  Rowe Clack, P.A.-C. DATE OF BIRTH:  18-May-1934   DATE OF ADMISSION:  04/28/2008  DATE OF DISCHARGE:                              HISTORY & PHYSICAL   CHIEF COMPLAINT:  Shortness of breath and palpitations.   HISTORY OF PRESENT ILLNESS:  The patient is a 77 year old white female  well known to [facility] having recently undergone coronary artery bypass and  aortic valve replacement in early June 2009.  Recently, she was  hospitalized approximately 2 weeks ago with an episode of a TIA and  amaurosis fugax.  She was found to be in atrial fibrillation.  She was  started on Coumadin at that time.  Over the past several weeks, she  apparently has developed a left-sided effusion.  She was seen at the  [facility] on March 26, 2008, by Dr. Gerre Pebbles.  At that time, her INR was  4.1.  She was scheduled for an elective thoracentesis at the office on  April 29, 2008, however, on today's date, she had an episode of  approximately 45 minutes of palpitations.  Initially, she noted some  increasing shortness of breath.  Her shortness of breath has been  somewhat worsening over the past week or so, but she has no orthopnea.  She has a mild nonproductive cough.  She has also noted some increasing  fatigue.  She was seen today in the ER and found to be in normal sinus  rhythm with occasional PVCs.  We will admit her to the hospital for  further telemetry observation as well as plan her thoracentesis in the  morning.  Her INR is 1.6 on today's date.   PAST MEDICAL HISTORY:  1. Coronary artery disease.  2. Hypertension.  3. History of TIA and amaurosis fugax episode approximately 2 weeks      ago.  4. History of atrial fibrillation diagnosed approximately 2 weeks ago.  5. Hypothyroidism.  6. History of rheumatic  fever.  7. History of left retinal detachment.  8. History of dyslipidemia.  9. History of small infrarenal abdominal aortic aneurysm.  10.History of asthma.  11.History of homocysteinemia.  12.History of anemia of chronic disease.   PAST SURGICAL HISTORY:  Coronary artery bypass grafting and aortic valve  replacement with a tissue valve as described above.   OTHER DIAGNOSES:  Include:  1. History of resection of uterine fibroid.  2. History of hysterectomy.  3. History multiple left eye surgeries including repair of detached      retina, cataract, and laser surgeries.   ALLERGIES:  No known drug allergies, although she is GI intolerant to  CODEINE.   CURRENT MEDICATIONS:  1. Coumadin, which is on hold since March 26, 2008, it was 2.5 mg per      day.  2. Aspirin 81 mg daily.  3. Flovent 2 puffs b.i.d.  4. Albuterol metered dose inhaler 2 puffs q.6 h. p.r.n.  5. Levoxyl 50 mcg  daily.  6. Simvastatin 20 mg daily.  7. Multivitamin 1 daily.  8. Azopt eye drops 1 drop left eye b.i.d.  9. Iron sulfate 325 mg b.i.d.  10.Note, her Lopressor was recently stopped by Dr. Elsie Lincoln at a      followup appointment.  She was given a dose of 25 mg p.o. this      morning in the emergency room.   SOCIAL HISTORY:  She is divorced with 1 child.  Tobacco use none.  Alcohol use none.   FAMILY HISTORY:  Noncontributory.   PHYSICAL EXAMINATION:  VITAL SIGNS:  Blood pressure 143/63, heart rate  68, respirations 20, temperature 98 degrees, and oxygen saturation is  97% on 2 liters.  GENERAL APPEARANCE:  A 77 year old white female in no acute distress.  HEENT:  Normocephalic and atraumatic.  Pupils equal, round, and reactive  to light.  Extraocular movements intact.  Oral mucosa is pink with no  lesions.  She has a good dentition.  Sclerae is anicteric.  Pharynx is  clear of exudates or erythema.  NECK:  Supple.  No jugular venous distention.  No thyromegaly.  No  lymphadenopathy.  She does have  bilateral carotid bruits.  PULMONARY:  Symmetrical on inspiration unlabored.  Diminished breath  sounds in left base.  No wheezes.  No rhonchi.  No crackles.  CARDIAC:  Regular rate and rhythm with occasional extrasystole.  She has  a soft systolic murmur.  No rubs.  No gallops.  ABDOMEN:  Soft, nontender, and nondistended.  Normoactive bowel sounds.  No masses.  No bruits.  No hepatosplenomegaly.  GENITOURINARY/RECTAL:  Deferred.  EXTREMITIES:  No edema.  No clubbing.  No cyanosis.  Incisions are all  well healed without evidence of infection.  Pulses were equal and intact  bilaterally.  NEUROLOGIC:  Nonfocal.   ASSESSMENT:  Questionable episode of paroxysmal atrial fibrillation.  She is currently in normal sinus rhythm with occasional PVCs.  We will  admit her for telemetry.  Additionally, she has a left effusion and is  moderately large in size and will require thoracocentesis.  We will  schedule this as well.  Other diagnosis as per the history.      Rowe Clack, P.A.-C.     Sherryll Burger  D:  04/28/2008  T:  04/29/2008  Job:  [id number]   cc:   [facility]  Madaline Savage, M.D.  Mosetta Putt, M.D.

## 2011-03-09 NOTE — Discharge Summary (Signed)
[name], [name]              ACCOUNT NO.:  [id number]   MEDICAL RECORD NO.:  [id number]          PATIENT TYPE:  INP   LOCATION:  2015                         FACILITY:  [facility]   PHYSICIAN:  Salvatore Decent. Cornelius Moras, M.D. DATE OF BIRTH:  [birth year]   DATE OF ADMISSION:  03/26/2008  DATE OF DISCHARGE:  04/03/2008                               DISCHARGE SUMMARY   ADDENDUM   Currently, today on postop day #5 the patient again is afebrile and  vital signs are stable.  BMET done this morning showed the potassium was  low at 3, this was supplemented.  Her pacing wires were removed.  Her  chest tube sutures were then removed.  No change in physical exam.  She  had previously been A paced.  The pacer was turned off yesterday.  She  remains in normal sinus rhythm with a heart rate in the 70s.  In  addition, a chest x-ray was done on April 02, 2008, which again showed  tiny bilateral apical pneumothoraces and small bilateral pleural  effusions.  Per Dr. Cornelius Moras, the patient will be discharged home today.   Additional medications include triamterene/hydrochlorothiazide 37.5/12.5  mg p.o. daily, potassium 10 mEq p.o. daily, ECASA 325 mg p.o. daily.  Please add all these changes to the previously dictated discharge  summary on the patient, [name].      Doree Fudge, PA      [place] [facility]. Cornelius Moras, M.D.  Electronically Signed    DZ/MEDQ  D:  04/03/2008  T:  04/04/2008  Job:  [id number]   cc:   Madaline Savage, M.D.

## 2011-03-09 NOTE — Assessment & Plan Note (Signed)
OFFICE VISIT   [name], [name]  DOB:  09/30/1934                                        April 25, 2008  CHART #:  [id number]   The patient had previously undergone coronary artery bypass grafting and  aortic valve replacement by Dr. Cornelius Moras in early June.  She was  subsequently admitted back to the hospital with TIA and amaurosis,  developed atrial fibrillation, and was started on Coumadin.  She  presented 2 days ago to Dr. Truett Perna office, and a followup chest x-ray  was done that showed a moderate-sized left pleural effusion.  Previous  films on June 18th showed a definite left pleural effusion that is  smaller than the current film.  The patient is referred to the office  for evaluation of left pleural effusion.  On exam, blood pressure is  142/78, pulse is 68 and regular, respiratory rate is 18, and O2 sat is  98%.  She is able to lay flat without significant shortness of breath.  On exam, her sternum is stable and well healed.  She has decreased  breath sounds over the left base, clear over the right.  She has no  pedal edema.   The film done in Dr. Truett Perna office is reviewed.  The patient currently  is on Coumadin, but has not had protimes checked since she was  discharged from the hospital.  With the increasing pleural effusion, I  have recommended to her a thoracentesis; however, before considering  this, we obtained a stat INR and protime.  Protime now is 27.4 with an  INR of 4.1.  Since the patient is not in a significant respiratory  difficulty, I have told her to decrease her aspirin dose to 81 mg a day  rather than a full aspirin and stop taking her Coumadin.  Now, we will  let the level return near normal and come in in several days, July 6,  with a CBC, repeat protime, and ultrasound-guided thoracentesis and  radiology.  If there is satisfactory drainage of the fluid, we can  discuss  with Dr. Elsie Lincoln the resuming her Coumadin at a lower dose.   Following  the thoracentesis, I will see her back in the office in approximately a  week with a followup chest x-ray.   Sheliah Plane, MD  Electronically Signed   EG/MEDQ  D:  04/25/2008  T:  04/26/2008  Job:  [id number]   cc:   Salvatore Decent. Cornelius Moras, M.D.  Madaline Savage, M.D.  Mosetta Putt, M.D.

## 2011-03-09 NOTE — Op Note (Signed)
[name], SHERBURNE              ACCOUNT NO.:  [id number]   MEDICAL RECORD NO.:  [id number]          PATIENT TYPE:  INP   LOCATION:  2315                         FACILITY:  [facility]   PHYSICIAN:  Salvatore Decent. Cornelius Moras, M.D. DATE OF BIRTH:  1934-03-29   DATE OF PROCEDURE:  03/29/2008  DATE OF DISCHARGE:                               OPERATIVE REPORT   PREOPERATIVE DIAGNOSES:  Severe aortic stenosis with left main disease  and 3-vessel coronary artery disease.   POSTOPERATIVE DIAGNOSES:  Severe aortic stenosis with left main disease  and 3-vessel coronary artery disease.   PROCEDURE:  Median sternotomy for aortic valve replacement [facility] Ease pericardial tissue valve) and coronary artery bypass grafting  x3 (left internal mammary artery to distal left anterior descending  coronary artery, saphenous vein graft to the circumflex marginal branch,  saphenous vein graft to the left posterolateral branch, and endoscopic  saphenous vein harvest from right thigh).   SURGEON:  Salvatore Decent. Cornelius Moras, MD   ASSISTANT:  Evelene Croon, MD   SECOND ASSISTANT:  Rowe Clack, PA-C   ANESTHESIA:  General.   BRIEF CLINICAL NOTE:  The patient is a 77 year old female with known  history of heart murmur who presents with symptoms of progressive  exertional fatigue  and shortness of breath.  Echocardiogram reveals normal left ventricular  systolic function with moderate-to-severe aortic stenosis.  The patient  subsequently underwent elective cardiac catheterization.  This confirms  the presence of moderate-to-severe aortic stenosis with left main  disease and 3-vessel coronary artery disease.  Left ventricular function  is normal.  A full consultation note has been dictated previously.   OPERATIVE CONSENT:  The patient and her family have been counseled at  length regarding the indications, risks, and potential benefits of  surgery.  Alternative treatment strategies have been discussed.  They  understand and accept all associated risks of surgery and desire to  proceed as described.   OPERATIVE FINDINGS:  1. Normal left ventricular systolic function.  2. Severe aortic stenosis.  3. Good quality left internal mammary artery and saphenous vein      conduit for grafting.  4. Good quality left anterior descending coronary artery and      circumflex marginal branch target vessel for grafting.  5. Small caliber terminal branches of the distal left circumflex      coronary artery.   OPERATIVE NOTE IN DETAIL:  The patient is brought to the operating room  on the above-mentioned date and central monitoring was established by  the anesthesia service under the care and direction of Dr. Claybon Jabs.  Specifically, a Swan-Ganz catheter is placed through the right  internal jugular approach.  A radial arterial line is placed.  Intravenous antibiotics are administered.  Following induction with  general endotracheal anesthesia, a Foley catheter is placed.  The  patient's chest, abdomen, both groins, and both lower extremities are  prepared and draped in sterile manner.  Baseline transesophageal  echocardiogram is performed by Dr. Krista Blue.  This confirms the presence  of severe aortic stenosis.  There is normal left ventricular function.  No other significant abnormalities are noted.   A median sternotomy incision is performed and the left internal mammary  artery is dissected from the chest wall and prepared for bypass  grafting. The left internal mammary artery is good quality conduit.  Simultaneously, saphenous vein is obtained from the patient's right  thigh using endoscopic vein harvest technique.  The saphenous vein is  slightly small caliber, but otherwise good quality conduit.  The  saphenous vein is removed, and the small incision in the right lower  extremity is closed in multiple layers with running absorbable suture.  The patient is heparinized systemically.  The left  internal mammary  artery is transected distally and noted to have excellent flow.   The pericardium is opened.  The ascending aorta is normal in appearance  and if anything, somewhat small caliber.  The ascending aorta is  cannulated for cardiopulmonary bypass.  Venous cannula is placed through  the right atrium.  A retrograde cardioplegic catheter is placed through  the right atrium into the coronary sinus.  Adequate heparinization is  verified.  Cardiopulmonary bypass is begun, and the surface of the heart  is inspected.  Distal targets are selected for coronary bypass grafting.  All the terminal branches of the distal left circumflex coronary artery  are small caliber with the posterolateral branch off the distal left  circumflex being the largest sub-branch.  The posterior descending  coronary artery is quite small and arises off the left circumflex.  Left  ventricular vent is placed through the right superior pulmonary vein.  A  cardioplegic catheter is placed in the ascending aorta.   The patient is cooled to 32 degrees systemic temperature.  The aortic  cross-clamp is applied, and cold blood cardioplegia is administered  initially in an antegrade fashion through the aortic root.  Iced saline  slush is applied for topical hypothermia and supplemental cardioplegia  has been inserted retrograde through the coronary sinus catheter.  The  initial cardioplegic arrest and myocardial cooling is felt to be  excellent.  Repeat doses of cardioplegia are administered intermittently  throughout the cross-clamp portion of the operation through the aortic  root, down subsequently placed vein grafts, and retrograde through the  coronary sinus catheter to maintain left ventricular septal temperature  above 15 degrees centigrade.   The following distal coronary anastomoses are performed:  1. The circumflex marginal branch is grafted with a saphenous vein      graft in an end-to-side fashion.   This vessel measures 1.8 mm in      diameter and is a good quality target vessel at the site of distal      grafting.  2. The posterolateral branch of the distal left circumflex coronary      artery is grafted with a saphenous vein graft in an end-to-side      fashion.  This vessel measures 1.0 mm in diameter and is a fair      quality target vessel for grafting.  It is the largest sub-branch      of the terminal left circumflex system.  3. The distal left anterior descending coronary artery is grafted with      left internal mammary artery in an end-to-side fashion.  This      vessel measures 1.5 mm in diameter and is a good quality target      vessel at the site of distal grafting.   An oblique aortotomy incision is performed.  The aortic valve is  inspected.  The aortic valve is tricuspid and severely stenotic.  The  aortic valve is excised sharply.  The aortic annulus is decalcified.  This is technically straightforward.  The aortic annulus is sized to  accept a 21-mm stented bioprosthetic tissue valve.   The aortic valve replacement is performed using interrupted 2-0 Ethibond  horizontal mattress pledgeted sutures with pledgets in the subannular  position.  An [facility] Ease bovine pericardial tissue valve (model  #[id number], serial [id number]) is secured in place uneventfully.  Rewarming  is begun.  The aortotomy is closed using a 2-layer closure of running 4-  0 Prolene suture.  Both proximal saphenous vein anastomoses are  performed directly to the ascending aorta prior to removal of the aortic  cross-clamp.   The left ventricular septal temperature rises rapidly with reperfusion  of the left internal mammary artery.  The lungs are ventilated and the  heart allowed to fill to evacuate any residual air through the aortic  root.  The aortic cross-clamp is removed after a total cross-clamp time  of 120 minutes.  The heart begins to beat spontaneously without need for   cardioversion.  All proximal and distal coronary anastomoses are  inspected for hemostasis and appropriate graft orientation.  Epicardial  pacing wires are fixed to the right ventricular free wall into the right  atrial appendage.  The left ventricular vent is removed.  A Magoon  needle is placed in the ascending aorta to serve as a root vent.  The  patient is rewarmed to 37 degrees centigrade temperature.  The patient  is weaned from cardiopulmonary bypass without difficulty.  The patient's  rhythm at separation from bypass is normal sinus rhythm with second-  degree AV block.  AV sequential pacing is employed.  Total  cardiopulmonary bypass time for the operation is 142 minutes.  No  inotropic support is required.   Followup transesophageal echocardiogram performed by Dr. Krista Blue after  separation from bypass demonstrates normal left ventricular function.  There is a well-seated bioprosthetic tissue valve in the aortic  position.  There is no aortic insufficiency.  There is trivial mitral  regurgitation.  There is no significant residual air.  No other  abnormalities are noted.   The left aortic root vent is removed.  The venous and arterial cannulae  are removed.  Protamine is administered to reverse the anticoagulation.  The mediastinum and the left chest are irrigated with saline solution  containing vancomycin.  Meticulous surgical hemostasis is ascertained.  The mediastinum and the left pleural space are drained using 3 chest  tubes exited through separate stab incisions inferiorly.  The  pericardium and soft tissues anterior to the aorta are reapproximated  loosely.  The sternum is closed with double-strength sternal wire.  The  On-Q continuous pain management system is utilized to facilitate  postoperative pain control.  Two 10-inch catheters are supplied with the  On-Q kit tunneled into the deep subcutaneous tissues and positioned just  lateral to the lateral border of the  sternum on either side.  Each  catheter was flushed with 5 mL of 0.5% bupivacaine solution and  ultimately connected to continuous infusion pump.  The soft tissues  anterior to the sternum are closed in multiple layers and the skin is  closed with a running subcuticular skin closure.   The patient tolerated the procedure well and is transported to the  surgical intensive care unit in stable condition.  There are no  intraoperative complications.  All  sponge, instrument, and needle counts  are verified correct at completion of the operation.  The patient was  transfused 2 units of packed red blood cells due to anemia, which was  present prior to surgery and exacerbated with hemodilution.      Salvatore Decent. Cornelius Moras, M.D.  Electronically Signed     CHO/MEDQ  D:  03/29/2008  T:  03/30/2008  Job:  [id number]   cc:   Madaline Savage, M.D.  Mosetta Putt, M.D.

## 2011-03-09 NOTE — Discharge Summary (Signed)
NAMEADREONA, BRAND              ACCOUNT NO.:  [id number]   MEDICAL RECORD NO.:  [id number]          PATIENT TYPE:  INP   LOCATION:  2015                         FACILITY:  [facility]   PHYSICIAN:  Salvatore Decent. Cornelius Moras, M.D. DATE OF BIRTH:  19-May-1934   DATE OF ADMISSION:  03/26/2008  DATE OF DISCHARGE:  04/03/2008                               DISCHARGE SUMMARY   ADMITTING DIAGNOSES:  1. Severe atrial stenosis.  2. Multi-vessel coronary artery disease (included 70% left main).  3. History of hypertension.  4. History of hyperlipidemia.  5. History of hypothyroidism.  6. History of asthma.  7. Infrarenal abdominal aortic aneurysm (3.5 cm).  8. History of rheumatic fever.   DISCHARGE DIAGNOSES:  1. Severe atrial stenosis.  2. Multi-vessel coronary artery disease (included 70% left main).  3. History of hypertension.  4. History of hyperlipidemia.  5. History of hypothyroidism.  6. History of asthma.  7. Infrarenal abdominal aortic aneurysm (3.5 cm).  8. History of rheumatic fever.   PROCEDURES:  1. Cardiac catheterization performed by Dr. Elsie Lincoln on March 26, 2008.  2. Aortic valve replacement (21-mm pericardial tissue value).  3. Coronary artery bypass grafting x3 (left internal mammary artery to      left anterior descending, saphenous vein graft to circumflex and      marginal, saphenous vein graft to posterolateral branch by Dr. Cornelius Moras      on March 29, 2008.   BRIEF HOSPITAL COURSE:  Today, this is a 77 year old Caucasian female,  patient of Dr. Duaine Dredge, who had a longstanding history of a heart  murmur (approximately since age 5) and history of rheumatic fever.  Over the last couple of years, has complained of progressive exertional  fatigue as well as shortness of breath with occasional tightness of her  chest.  The patient had an echocardiogram in March 2009, was found to  have preserved left ventricular function and found to have severe AS and  mild AI.  The aortic valve area  was estimated 0.76 cm sq. at that time.  She was also found to have mild MR and TR.  The patient was admitted to  [facility] on March 26, 2008.  She underwent cardiac catheterization.  She  was found to have severe AS with a valve area estimated to be 1.96 cm  sq., a preserved left ventricular function, 70% left main and 95%-99%  ostial stenosis of left circumflex coronary artery (involving the  bifurcation with a large first circumflex marginal and left-dominant  coronary artery).  Cardiothoracic consultation was obtained.  Carotid  Duplex ultrasound preoperatively showed no right internal carotid artery  stenosis and 40%-60% left internal carotid artery stenosis.  The patient  then underwent the aforementioned AVR and CABG x3.   Postoperatively, the patient remained apaced.  She had thrombocytopenia  as well, this improved daily.  She was volume overloaded and diuresed  accordingly.  Her chest tube was removed on postop day #1 and followup  chest x-ray revealed a small right apical pneumothorax as well as later  a small left apical pneumothorax.  The patient remained afebrile and  hemodynamically stable.  She did have some nauseousness, felt secondary  to opiates.  Her p.o. pain medication was changed to Ultram with  resolution of her nauseousness.  She is doing well with cardiac rehab  and she continued to improve such that by postoperative day #4, again  she was afebrile.  Vital signs were stable.  Her preop weight was 70 kg,  today's weight is 71.1 kg.  She was found to be hypokalemic with  potassium of 2.4 early this morning.  She continues to be supplemented  and a potassium will be checked this afternoon.   PHYSICAL EXAMINATION:  CARDIOVASCULAR:  She continues to be apaced.  Her  AI is currently set at 70.  Her intrinsic heart rate is mostly in the  70s.  The pacer most likely will be turned out and then eventually  turned off.  Her heart rate, we will continue to monitor  closely.  PULMONARY:  Lungs are clear to auscultation bilaterally.  No rales, no  wheezes, or rhonchi.  EXTREMITIES:  No edema.  Her wounds are clean and dry.  She does have  some ecchymosis of the right lower extremity.   She is going to be discharged on April 03, 2008.  Prior to her discharge,  pacing wires and chest tube sutures will be removed.   DISCHARGE INSTRUCTIONS:  Include the following,  She is to remain on a low-fat, low-salt diet.  She is not to drive or  lift more than 10 pounds.  She is to continue with breathing exercises  everyday.  She is to walk everyday and increase her frequency and  duration as tolerated.  She needs to call to make a followup appointment  for 2 weeks to see [facility] (Dr. Elsie Lincoln).  She has an  appointment to see Dr. Cornelius Moras on April 22, 2008.  Prior to this  appointment, chest x-ray will be obtained.  Last chest x-ray is going to  be done today, April 02, 2008.   LATEST LABORATORY STUDIES:  CBC done on April 02, 2008, H&H is 11.1 and  31.2 respectively, white count 7600, and platelet count of 130,000.  Last BMET also done today as previously stated, potassium 2.4.  BUN and  creatinine 14 and 1.04 respectively.  BMET will also be drawn in the  morning prior to discharge.   DISCHARGE MEDICATIONS:  Include the following,  1. Lisinopril 10 mg p.o. daily.  2. Zocor 20 mg p.o. nightly.  3. Levoxyl 50 mcg p.o. daily.  4. Flovent 110 mcg inhaled twice daily.  5. Albuterol 90 mcg hourly p.r.n.  6. Toprol XL 25 mg p.o. daily.  7. Ultram 50 mg 1 or 2 tablets p.o. every 4-6 hours as needed for      pain.      Doree Fudge, PA      [place] [facility]. Cornelius Moras, M.D.  Electronically Signed    DZ/MEDQ  D:  04/02/2008  T:  04/03/2008  Job:  [id number]   cc:   Mosetta Putt, M.D.  Madaline Savage, M.D.

## 2011-03-09 NOTE — Op Note (Signed)
[name], [name]              ACCOUNT NO.:  [id number]   MEDICAL RECORD NO.:  [id number]          PATIENT TYPE:  INP   LOCATION:  2028                         FACILITY:  [facility]   PHYSICIAN:  Evelene Croon, M.D.     DATE OF BIRTH:  [birth year]   DATE OF PROCEDURE:  DATE OF DISCHARGE:                               OPERATIVE REPORT   PREOPERATIVE DIAGNOSIS:  Large left pleural effusion status post aortic  valve replacement.   POSTOPERATIVE DIAGNOSIS:  Large left pleural effusion status post aortic  valve replacement.   PROCEDURE:  Left thoracentesis.   SURGEON:  Evelene Croon, MD   ANESTHESIA:  Lidocaine 1% local.   CLINICAL HISTORY:  This patient is a 78 year old woman, who is a couple  of weeks status post aortic valve replacement.  She had a tissue valve  in place.  This was performed by Dr. Cornelius Moras.  She had some postoperative  atrial fibrillation and apparently was started on Coumadin at some  point.  She was seen in the office last Thursday by Dr. Tyrone Sage, due to  some shortness of breath and a chest x-ray showed a moderate-sized left  pleural effusion.  Her INR at that time was 4.0.  She was told to hold  her Coumadin and was to return to the office tomorrow for a left  thoracentesis by Dr. Tyrone Sage.  She called me this morning, reporting a  racing heartbeat as well as mild shortness of breath.  I told her to  come to the emergency room.  In the emergency room, she apparently  returned to sinus rhythm.  It is unclear to me, whether she was in  atrial fibrillation or not.  Her INR was only 1.6.  A chest x-ray did  show a moderate-to-large left pleural effusion with left lower lobe  atelectasis.  We decided to admit her to the hospital for left  thoracentesis and monitoring of her heart rhythm as well as  reinstitution of Coumadin, after the thoracentesis.   OPERATIVE PROCEDURE:  After obtaining informed consent, the patient was  positioned on the side of her bed, sitting  upright.  The left side of  the back was prepped with DuraPrep and draped in the usual sterile  manner.  The skin and subcutaneous tissue in the left lower back was  anesthetized with 1% lidocaine and local anesthesia.  A small nick was  made in the skin with a scalpel and then the thoracentesis needle and  catheter were inserted into the pleural space without difficulty.  The  needle was removed and the catheter connected to vacuum bottle suction.  Amber serous fluid, 1200 mL was removed without difficulty.  The patient  tolerated the procedure well.  The catheter was removed and a Band-Aid  applied to the site.  Followup chest x-ray is pending.      Evelene Croon, M.D.  Electronically Signed     BB/MEDQ  D:  04/28/2008  T:  04/28/2008  Job:  [id number]

## 2011-03-09 NOTE — Assessment & Plan Note (Signed)
OFFICE VISIT   [name], [name]  DOB:  09-05-1934                                        May 13, 2008  CHART #:  [id number]   ADDENDUM   I have reviewed the patient's x-ray from today, and she has a small  persistent left effusion following her thoracentesis approximately 2  weeks ago.  I have reviewed this x-ray with Dr. Dorris Fetch who is our  office physician today, and he feels that she would benefit from 5-day  Medrol Dosepak.  I will contact the patient and have her give [place] her  pharmacy number, and we will call this in for her and let her start on  it.  She does have a scheduled followup with Dr. Cornelius Moras in 2 weeks, and we  will have her have a repeat chest x-ray prior to this appointment to  reevaluate her effusion.  She is also instructed to call our office in  the interim, if she has any worsening shortness of breath.   Salvatore Decent. Cornelius Moras, M.D.  Electronically Signed   GC/MEDQ  D:  05/13/2008  T:  05/14/2008  Job:  [id number]

## 2011-03-09 NOTE — Discharge Summary (Signed)
[name], [name]              ACCOUNT NO.:  [id number]   MEDICAL RECORD NO.:  [id number]          PATIENT TYPE:  INP   LOCATION:  3737                         FACILITY:  [facility]   PHYSICIAN:  Herbie Saxon, MDDATE OF BIRTH:  01/29/1934   DATE OF ADMISSION:  04/10/2008  DATE OF DISCHARGE:  04/15/2008                               DISCHARGE SUMMARY   DISCHARGE DIAGNOSES:  1. Transient ischemic attack, resolved.  2. Amaurosis fugax, resolved.  3. History of coronary artery disease status post CABG.  4. History of aortic valve replacement secondary to severe aortic      stenosis.  5. Hypertension, stable.  6. New-onset atrial fibrillation with controlled ventricular rate.  7. Hypothyroidism.  8. History of rheumatic fever.  9. History of left retina detachment.  10.Dyslipidemia.  11.History of infrarenal abdominal aortic aneurysm.  12.History of bronchial asthma.  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  14.She had mild dehydration, which has resolved.  15.Anemia of chronic disease.   CONSULTATIONS:  Salvatore Decent. Cornelius Moras, M.D., Cardiothoracic Surgery.  Madaline Savage, M.D., Cardiology.   HOSPITAL COURSE:  This is a 78 year old lady who was discharged about 6  days prior to this admission, presented to the emergency room with  transient loss of vision in the right eye.  By the second day of the  admission, she was noticed to be having atrial fibrillation on the  rhythm strip, subsequently started on full dose Lovenox and Coumadin  bridging.  Her blood test shows she has elevated TSH necessitating  increasing Levoxyl dose to 75 mcg daily.  Homocysteine level was also  elevated and she was started on folate.  The echocardiogram on this  admission showed an ejection fraction of 60%.  The TEE that was  performed, results showed normal aortic valve; full report to be  followed up as an outpatient by Dr. Duaine Dredge and Dr. Elsie Lincoln, the  cardiologist.  The patient has also been scheduled to have  Ophthalmology  followup in next 2 days.  This will be coordinated by her primary care  physician.  The patient has been started on Coumadin, and at present,  her INR is therapeutic.  She has been continued on Coumadin treatment.  The goal INR should be 2-3.  The Coumadin treatment to be titrated as an  outpatient by the primary care physician in conjunction with  cardiologist as outpatient, watching for bleeding and coagulopathy.   DISCHARGE CONDITION:  Stable.   DIET:  To be low-sodium heart healthy, low cholesterol.   ACTIVITY:  To be increased slowly as tolerated.   FOLLOWUP:  Follow up with primary care physician, Dr. Mosetta Putt, in  the next 3 days.  He will arrange the Ophthalmology followup as an  outpatient.  Follow up with Dr. Elsie Lincoln, cardiologist, in the next 1-2  weeks.  Follow up with Dr. Cornelius Moras, cardiovascular thoracic surgeon, in the  next 3-4 weeks.  Neurology followup as outpatient as needed, will be  taken care by the primary care physician.   DISCHARGE MEDICATIONS:  1. Coumadin 2.5 mg nightly.  2. Enteric-coated aspirin 81 mg daily.  3. Flovent 2 puffs  twice daily.  4. Albuterol MDI 2 puffs q.6 h. as needed.  5. Metoprolol 25 mg twice daily.  6. Vitamin B12 1000 mcg daily.  7. Levoxyl 75 mcg daily.  8. Folic acid 1 mg daily.  9. Nabumetone 750 mg twice daily as necessary for pain.  10.Simvastatin 20 mg daily.  11.Azopt eye drop, left eye twice daily.  12.Multivitamin.  13.Iron sulfate 325 mg twice daily.   PHYSICAL EXAMINATION:  GENERAL:  On examination today, she is an elderly  lady not in acute distress.  VITAL SIGNS:  Temperature is 98, pulse 80, respiratory rate 20, and  blood pressure 132/72.  HEENT:  Pupils equal reacting to light and accommodation, reduced visual  acuity in the left eye.  Head is atraumatic and normocephalic.  NECK:  Supple.  No elevated JVD or thyromegaly.  No carotid bruits.  Mucous membranes are moist.  HEART:  Sounds 1 and 2  regular with 2/6 systolic murmur at the apex.  CHEST:  Clinically clear.  ABDOMEN:  Soft and nontender.  No organomegaly.  Bowel sounds are  normoactive.  NEUROLOGIC:  She is alert and oriented in time, place, and person.  Peripheral pulses present.  No pedal edema.  Cranial nerves II through  XII intact.  Power is 5 globally.   Available labs shows that the INR is 2.9, PT 31.  WBC is 3.7, hematocrit  28, platelet count is 282.  Chemistry shows sodium of 138, potassium  4.0, chloride 107, bicarbonate 23, glucose 120, BUN 12, creatinine 1.1.      Herbie Saxon, MD  Electronically Signed     MIO/MEDQ  D:  04/15/2008  T:  04/15/2008  Job:  [id number]   cc:   Mosetta Putt, M.D.  Madaline Savage, M.D.  Salvatore Decent. Cornelius Moras, M.D.

## 2011-07-12 ENCOUNTER — Ambulatory Visit
Admission: RE | Admit: 2011-07-12 | Discharge: 2011-07-12 | Disposition: A | Discharge status: Home / Self Care | Payer: Medicare Other | Source: Ambulatory Visit | Attending: Family Medicine | Admitting: Family Medicine

## 2011-07-12 ENCOUNTER — Other Ambulatory Visit: Payer: Self-pay | Admitting: Family Medicine

## 2011-07-12 DIAGNOSIS — M79642 Pain in left hand: Secondary | ICD-10-CM

## 2011-07-22 LAB — CBC
HCT: 30.5 — ABNORMAL LOW
HCT: 30.8 — ABNORMAL LOW
HCT: 31.3 — ABNORMAL LOW
HCT: 32.2 — ABNORMAL LOW
HCT: 32.6 — ABNORMAL LOW
HCT: 32.9 — ABNORMAL LOW
HCT: 27.4 — ABNORMAL LOW
HCT: 28.8 — ABNORMAL LOW
HCT: 33.2 — ABNORMAL LOW
Hemoglobin: 10.6 — ABNORMAL LOW
Hemoglobin: 10.8 — ABNORMAL LOW
Hemoglobin: 11 — ABNORMAL LOW
Hemoglobin: 11.1 — ABNORMAL LOW
Hemoglobin: 11.3 — ABNORMAL LOW
Hemoglobin: 10 — ABNORMAL LOW
Hemoglobin: 11.3 — ABNORMAL LOW
Hemoglobin: 9.4 — ABNORMAL LOW
Hemoglobin: 11.1 — ABNORMAL LOW
MCHC: 34.7
MCHC: 34.8
MCHC: 34.2
MCHC: 34.2
MCHC: 34.6
MCHC: 34.2
MCV: 90.5
MCV: 90.6
MCV: 90.6
MCV: 91.2
MCV: 91.4
Platelets: 103 — ABNORMAL LOW
Platelets: 112 — ABNORMAL LOW
Platelets: 130 — ABNORMAL LOW
Platelets: 137 — ABNORMAL LOW
Platelets: 319
RBC: 3.3 — ABNORMAL LOW
RBC: 3.35 — ABNORMAL LOW
RBC: 3.46 — ABNORMAL LOW
RBC: 3.6 — ABNORMAL LOW
RBC: 3.17 — ABNORMAL LOW
RBC: 3.63 — ABNORMAL LOW
RBC: 3.61 — ABNORMAL LOW
RDW: 12.6
RDW: 13.5
RDW: 13.7
WBC: 11 — ABNORMAL HIGH
WBC: 11.8 — ABNORMAL HIGH
WBC: 16.1 — ABNORMAL HIGH
WBC: 4.1
WBC: 7.2
WBC: 7.6
WBC: 9.2

## 2011-07-22 LAB — POCT I-STAT 3, ART BLOOD GAS (G3+)
Bicarbonate: 22.7
Bicarbonate: 25 — ABNORMAL HIGH
Bicarbonate: 26.5 — ABNORMAL HIGH
O2 Saturation: 100
O2 Saturation: 93
O2 Saturation: 97
Operator id: 118461
Operator id: 118461
Operator id: 128731
Patient temperature: 35.1
Patient temperature: 36.3
TCO2: 28
pCO2 arterial: 38.1
pH, Arterial: 7.373
pH, Arterial: 7.398
pH, Arterial: 7.4
pH, Arterial: 7.41 — ABNORMAL HIGH
pO2, Arterial: 68 — ABNORMAL LOW
pO2, Arterial: 68 — ABNORMAL LOW
pO2, Arterial: 85

## 2011-07-22 LAB — CREATININE, SERUM
Creatinine, Ser: 1.16
GFR calc Af Amer: >60
GFR calc non Af Amer: 46 — ABNORMAL LOW
GFR calc non Af Amer: 52 — ABNORMAL LOW

## 2011-07-22 LAB — DIFFERENTIAL
Basophils Absolute: 0
Basophils Relative: 1
Basophils Relative: 1
Eosinophils Absolute: 0.4
Eosinophils Relative: 4
Lymphocytes Relative: 8 — ABNORMAL LOW
Lymphocytes Relative: 19
Lymphs Abs: 0.7
Monocytes Absolute: 0.9
Monocytes Absolute: 0.5
Monocytes Relative: 10
Monocytes Relative: 11
Neutro Abs: 7.1
Neutro Abs: 2.6
Neutrophils Relative %: 78 — ABNORMAL HIGH

## 2011-07-22 LAB — POCT I-STAT 3, VENOUS BLOOD GAS (G3P V)
Acid-Base Excess: 3 — ABNORMAL HIGH
Bicarbonate: 28.5 — ABNORMAL HIGH
O2 Saturation: 64
O2 Saturation: 87
Operator id: 118461
pCO2, Ven: 40.9 — ABNORMAL LOW
pCO2, Ven: 48.4
pH, Ven: 7.366 — ABNORMAL HIGH
pO2, Ven: 36
pO2, Ven: 57 — ABNORMAL HIGH

## 2011-07-22 LAB — POCT I-STAT 4, (NA,K, GLUC, HGB,HCT)
Glucose, Bld: 100 — ABNORMAL HIGH
Glucose, Bld: 111 — ABNORMAL HIGH
Glucose, Bld: 118 — ABNORMAL HIGH
Glucose, Bld: 132 — ABNORMAL HIGH
Glucose, Bld: 83
Glucose, Bld: 95
HCT: 20 — ABNORMAL LOW
HCT: 22 — ABNORMAL LOW
HCT: 22 — ABNORMAL LOW
HCT: 29 — ABNORMAL LOW
Hemoglobin: 7.5 — CL
Hemoglobin: 7.5 — CL
Hemoglobin: 7.5 — CL
Hemoglobin: 9.2 — ABNORMAL LOW
Hemoglobin: 9.9 — ABNORMAL LOW
Operator id: 3291
Operator id: 3291
Operator id: 3291
Operator id: 3291
Operator id: 3291
Potassium: 3.4 — ABNORMAL LOW
Potassium: 3.4 — ABNORMAL LOW
Potassium: 3.5
Potassium: 4.2
Potassium: 4.2
Potassium: 4.5
Sodium: 126 — ABNORMAL LOW
Sodium: 131 — ABNORMAL LOW
Sodium: 133 — ABNORMAL LOW
Sodium: 135

## 2011-07-22 LAB — BASIC METABOLIC PANEL
BUN: 13
BUN: 14
BUN: 14
BUN: 19
BUN: 18
CO2: 31
CO2: 33 — ABNORMAL HIGH
CO2: 23
CO2: 24
CO2: 28
Calcium: 8.2 — ABNORMAL LOW
Calcium: 8.3 — ABNORMAL LOW
Calcium: 8.4
Calcium: 8.4
Calcium: 8.8
Calcium: 8.8
Calcium: 8.9
Chloride: 96
Chloride: 97
Chloride: 99
Chloride: 107
Creatinine, Ser: 1.01
Creatinine, Ser: 1.14
Creatinine, Ser: 1.18
Creatinine, Ser: 0.97
GFR calc Af Amer: 55 — ABNORMAL LOW
GFR calc Af Amer: 55 — ABNORMAL LOW
GFR calc Af Amer: 57 — ABNORMAL LOW
GFR calc Af Amer: >60
GFR calc Af Amer: >60
GFR calc non Af Amer: 45 — ABNORMAL LOW
GFR calc non Af Amer: 45 — ABNORMAL LOW
GFR calc non Af Amer: 47 — ABNORMAL LOW
GFR calc non Af Amer: 52 — ABNORMAL LOW
GFR calc non Af Amer: 53 — ABNORMAL LOW
GFR calc non Af Amer: 41 — ABNORMAL LOW
GFR calc non Af Amer: 45 — ABNORMAL LOW
GFR calc non Af Amer: 57 — ABNORMAL LOW
Glucose, Bld: 139 — ABNORMAL HIGH
Glucose, Bld: 93
Glucose, Bld: 93
Glucose, Bld: 104 — ABNORMAL HIGH
Glucose, Bld: 120 — ABNORMAL HIGH
Potassium: 2.4 — CL
Potassium: 3 — ABNORMAL LOW
Potassium: 3 — ABNORMAL LOW
Potassium: 3.7
Potassium: 4.5
Potassium: 4
Sodium: 130 — ABNORMAL LOW
Sodium: 136
Sodium: 136
Sodium: 137
Sodium: 139
Sodium: 135
Sodium: 138
Sodium: 141

## 2011-07-22 LAB — URINALYSIS, MICROSCOPIC ONLY
Bilirubin Urine: NEGATIVE
Glucose, UA: NEGATIVE
Hgb urine dipstick: NEGATIVE
Specific Gravity, Urine: 1.008
Urobilinogen, UA: 0.2

## 2011-07-22 LAB — PROTIME-INR
INR: 1.1
INR: 1
INR: 2.9 — ABNORMAL HIGH
Prothrombin Time: 14
Prothrombin Time: 13.8
Prothrombin Time: 22.6 — ABNORMAL HIGH

## 2011-07-22 LAB — COMPREHENSIVE METABOLIC PANEL
ALT: 12
BUN: 12
Calcium: 8.7
Creatinine, Ser: 1.13
Glucose, Bld: 111 — ABNORMAL HIGH
Sodium: 136
Total Protein: 5.5 — ABNORMAL LOW

## 2011-07-22 LAB — TSH
TSH: 6.651 — ABNORMAL HIGH
TSH: 0.874 (ref 0.350–4.500)

## 2011-07-22 LAB — POCT CARDIAC MARKERS
CKMB, poc: <1 — ABNORMAL LOW
Myoglobin, poc: 98.2
Operator id: 196461
Troponin i, poc: <0.05

## 2011-07-22 LAB — B-NATRIURETIC PEPTIDE (CONVERTED LAB): Pro B Natriuretic peptide (BNP): 317 — ABNORMAL HIGH

## 2011-07-22 LAB — LIPID PANEL
HDL: 32 — ABNORMAL LOW
HDL: 26 — ABNORMAL LOW
LDL Cholesterol: 53
Total CHOL/HDL Ratio: 3.1
Triglycerides: 77
VLDL: 13

## 2011-07-22 LAB — TYPE AND SCREEN

## 2011-07-22 LAB — POCT I-STAT, CHEM 8
Calcium, Ion: 1.18
HCT: 30 — ABNORMAL LOW
Hemoglobin: 10.2 — ABNORMAL LOW
TCO2: 20

## 2011-07-22 LAB — MAGNESIUM
Magnesium: 2.5
Magnesium: 2.8 — ABNORMAL HIGH
Magnesium: 3.1 — ABNORMAL HIGH
Magnesium: 2.1

## 2011-07-22 LAB — BASIC METABOLIC PANEL WITH GFR
Chloride: 103
Creatinine, Ser: 1.27 — ABNORMAL HIGH
GFR calc Af Amer: 50 — ABNORMAL LOW
Potassium: 4.6

## 2011-07-22 LAB — HEMOGLOBIN A1C
Hgb A1c MFr Bld: 5.2
Mean Plasma Glucose: 108

## 2011-07-22 LAB — BLOOD GAS, ARTERIAL
Acid-Base Excess: 2.2 — ABNORMAL HIGH
Bicarbonate: 26.2 — ABNORMAL HIGH
FIO2: 21
O2 Saturation: 97.3
Patient temperature: 98.6

## 2011-07-22 LAB — APTT
aPTT: 31
aPTT: 31

## 2011-07-22 LAB — CARDIAC PANEL(CRET KIN+CKTOT+MB+TROPI)
CK, MB: 1.3
Relative Index: INVALID
Relative Index: INVALID
Total CK: 46
Troponin I: 0.04
Troponin I: 0.05

## 2011-07-22 LAB — CK TOTAL AND CKMB (NOT AT ARMC): Relative Index: INVALID

## 2012-04-13 ENCOUNTER — Other Ambulatory Visit: Payer: Self-pay | Admitting: Family Medicine

## 2012-04-13 DIAGNOSIS — I714 Abdominal aortic aneurysm, without rupture: Secondary | ICD-10-CM

## 2012-04-19 ENCOUNTER — Ambulatory Visit
Admission: RE | Admit: 2012-04-19 | Discharge: 2012-04-19 | Disposition: A | Discharge status: Home / Self Care | Payer: Medicare Other | Source: Ambulatory Visit | Attending: Family Medicine | Admitting: Family Medicine

## 2012-04-19 DIAGNOSIS — I714 Abdominal aortic aneurysm, without rupture: Secondary | ICD-10-CM

## 2012-04-21 ENCOUNTER — Emergency Department (HOSPITAL_COMMUNITY)
Admission: EM | Admit: 2012-04-21 | Discharge: 2012-04-21 | Disposition: A | Discharge status: Home / Self Care | Payer: Medicare Other | Attending: Emergency Medicine | Admitting: Emergency Medicine

## 2012-04-21 ENCOUNTER — Encounter (HOSPITAL_COMMUNITY): Payer: Self-pay | Admitting: Emergency Medicine

## 2012-04-21 DIAGNOSIS — I2510 Atherosclerotic heart disease of native coronary artery without angina pectoris: Secondary | ICD-10-CM | POA: Insufficient documentation

## 2012-04-21 DIAGNOSIS — S0100XA Unspecified open wound of scalp, initial encounter: Secondary | ICD-10-CM | POA: Insufficient documentation

## 2012-04-21 DIAGNOSIS — Y92009 Unspecified place in unspecified non-institutional (private) residence as the place of occurrence of the external cause: Secondary | ICD-10-CM | POA: Insufficient documentation

## 2012-04-21 DIAGNOSIS — Z79899 Other long term (current) drug therapy: Secondary | ICD-10-CM | POA: Insufficient documentation

## 2012-04-21 DIAGNOSIS — Z7982 Long term (current) use of aspirin: Secondary | ICD-10-CM | POA: Insufficient documentation

## 2012-04-21 DIAGNOSIS — J45909 Unspecified asthma, uncomplicated: Secondary | ICD-10-CM | POA: Insufficient documentation

## 2012-04-21 DIAGNOSIS — W2209XA Striking against other stationary object, initial encounter: Secondary | ICD-10-CM | POA: Insufficient documentation

## 2012-04-21 DIAGNOSIS — S0101XA Laceration without foreign body of scalp, initial encounter: Secondary | ICD-10-CM

## 2012-04-21 DIAGNOSIS — I10 Essential (primary) hypertension: Secondary | ICD-10-CM | POA: Insufficient documentation

## 2012-04-21 DIAGNOSIS — Z8673 Personal history of transient ischemic attack (TIA), and cerebral infarction without residual deficits: Secondary | ICD-10-CM | POA: Insufficient documentation

## 2012-04-21 HISTORY — DX: Cerebral infarction, unspecified: I63.9

## 2012-04-21 HISTORY — DX: Atherosclerotic heart disease of native coronary artery without angina pectoris: I25.10

## 2012-04-21 HISTORY — DX: Unspecified asthma, uncomplicated: J45.909

## 2012-04-21 HISTORY — DX: Essential (primary) hypertension: I10

## 2012-04-21 NOTE — ED Provider Notes (Signed)
History     CSN: [id number]  Arrival date & time 04/21/12  1918   First MD Initiated Contact with Patient 04/21/12 2034      Chief Complaint  Patient presents with  . Head Laceration    (Consider location/radiation/quality/duration/timing/severity/associated sxs/prior treatment) HPI Comments: Patient states, that she was working in her carport when she inadvertently stood up striking the top of her head on the metal edging.  She now has a laceration to the top of her head.  Bleeding has stopped.  She is unsure of her last tetanus but thinks its within the last 10, years.  She will check with her PCP on Monday.  She denies dizziness, nausea, loss of consciousness, headache  Patient is a 78 y.o. female presenting with scalp laceration. The history is provided by the patient.  Head Laceration This is a new problem. The current episode started today. The problem has been unchanged. Pertinent negatives include no headaches, neck pain or weakness.    Past Medical History  Diagnosis Date  . Asthma   . Hypertension   . Coronary artery disease   . Stroke     Past Surgical History  Procedure Date  . Coronary artery bypass graft     No family history on file.  History  Substance Use Topics  . Smoking status: Never Smoker   . Smokeless tobacco: Not on file  . Alcohol Use: No    OB History    Grav Para Term Preterm Abortions TAB SAB Ect Mult Living                  Review of Systems  Constitutional: Negative for activity change.  HENT: Negative for neck pain.   Eyes: Negative for photophobia and visual disturbance.  Skin: Positive for wound.  Neurological: Negative for dizziness, weakness and headaches.    Allergies  Codeine and Morphine and related  Home Medications   Current Outpatient Rx  Name Route Sig Dispense Refill  . ASPIRIN 81 MG PO CHEW Oral Chew 162 mg by mouth daily.    Marland Kitchen LOSARTAN POTASSIUM PO Oral Take by mouth.    Marland Kitchen MEMANTINE HCL 10 MG PO TABS Oral  Take 10 mg by mouth daily.    Marland Kitchen POTASSIUM CHLORIDE CRYS ER 20 MEQ PO TBCR Oral Take 20 mEq by mouth daily.      BP 165/78  Temp 98.6 F (37 C) (Oral)  SpO2 96%  Physical Exam  Constitutional: She is oriented to person, place, and time. She appears well-developed and well-nourished.  HENT:  Head: Normocephalic.  Eyes: Pupils are equal, round, and reactive to light.  Cardiovascular: Normal rate.   Pulmonary/Chest: Effort normal.  Musculoskeletal: Normal range of motion.  Neurological: She is alert and oriented to person, place, and time.  Skin: Skin is warm.       2 and half centimeter superficial laceration to the top of her head    ED Course  LACERATION REPAIR Date/Time: 04/21/2012 8:40 PM Performed by: Arman Filter Authorized by: Arman Filter Consent: Verbal consent obtained. Risks and benefits: risks, benefits and alternatives were discussed Patient understanding: patient states understanding of the procedure being performed Patient identity confirmed: verbally with patient Time out: Immediately prior to procedure a "time out" was called to verify the correct patient, procedure, equipment, support staff and site/side marked as required. Body area: head/neck Location details: scalp Laceration length: 2.5 cm Foreign bodies: metal Tendon involvement: none Nerve involvement: none Vascular damage: no  Anesthesia: local infiltration Local anesthetic: lidocaine 2% with epinephrine Anesthetic total: 2 ml Patient sedated: no Preparation: Patient was prepped and draped in the usual sterile fashion. Amount of cleaning: standard Skin closure: staples Number of sutures: 4 Approximation: loose Approximation difficulty: simple Patient tolerance: Patient tolerated the procedure well with no immediate complications.   (including critical care time)  Labs Reviewed - No data to display No results found.   1. Laceration of scalp       MDM   Scalp laceration, without  complications, when cleaned and 4 staples placed.          Arman Filter, NP 04/21/12 2041  Arman Filter, NP 04/21/12 2041

## 2012-04-21 NOTE — ED Provider Notes (Signed)
Medical screening examination/treatment/procedure(s) were performed by non-physician practitioner and as supervising physician I was immediately available for consultation/collaboration.    Celene Kras, MD 04/21/12 [phone]

## 2012-04-21 NOTE — ED Notes (Signed)
Pt has approx 1in lac to top of head. At this time NP has stapled lac and area is clean and dry. Pt in NAD at this time a&o x4.

## 2012-04-21 NOTE — ED Notes (Signed)
PT. REPORTS ACCIDENTALLY HIT HER HEAD AGAINST METAL FRAME AT CAR PORCH THIS EVENING , NO LOC , AMBULATORY , PRESENTS WITH LACERATION AT TOP SCALP APPROX. 1 INCH - BLEEDING CONTROLLED.

## 2012-04-21 NOTE — Discharge Instructions (Signed)

## 2012-04-21 NOTE — ED Notes (Signed)
Pt d/c home in NAD. Pt voiced understanding of d/c instructions and follow up care as well as how to care for staple area.

## 2012-11-10 ENCOUNTER — Encounter: Payer: Self-pay | Admitting: Gastroenterology

## 2012-12-22 ENCOUNTER — Other Ambulatory Visit (HOSPITAL_COMMUNITY): Payer: Self-pay | Admitting: Cardiovascular Disease

## 2012-12-22 DIAGNOSIS — I714 Abdominal aortic aneurysm, without rupture: Secondary | ICD-10-CM

## 2013-01-03 ENCOUNTER — Ambulatory Visit (HOSPITAL_COMMUNITY)
Admission: RE | Admit: 2013-01-03 | Discharge: 2013-01-03 | Disposition: A | Discharge status: Home / Self Care | Payer: Medicare Other | Source: Ambulatory Visit | Attending: Cardiovascular Disease | Admitting: Cardiovascular Disease

## 2013-01-03 DIAGNOSIS — I714 Abdominal aortic aneurysm, without rupture, unspecified: Secondary | ICD-10-CM

## 2013-01-03 DIAGNOSIS — I6529 Occlusion and stenosis of unspecified carotid artery: Secondary | ICD-10-CM | POA: Insufficient documentation

## 2013-01-03 DIAGNOSIS — I359 Nonrheumatic aortic valve disorder, unspecified: Secondary | ICD-10-CM | POA: Insufficient documentation

## 2013-01-03 HISTORY — DX: Abdominal aortic aneurysm, without rupture: I71.4

## 2013-01-03 HISTORY — DX: Abdominal aortic aneurysm, without rupture, unspecified: I71.40

## 2013-01-03 NOTE — Progress Notes (Signed)
2D Echo Performed 01/03/2013    Tammie Crouch, RCS  

## 2013-01-03 NOTE — Progress Notes (Signed)
Carotid Duplex Complete [name] 

## 2013-01-03 NOTE — Progress Notes (Signed)
Aorta Duplex Completed. Sturdivant, Rita D  

## 2013-02-04 ENCOUNTER — Encounter: Payer: Self-pay | Admitting: *Deleted

## 2013-02-05 ENCOUNTER — Encounter: Payer: Self-pay | Admitting: Cardiovascular Disease

## 2013-03-22 ENCOUNTER — Ambulatory Visit: Payer: Medicare Other | Admitting: Cardiovascular Disease

## 2013-03-27 ENCOUNTER — Ambulatory Visit (INDEPENDENT_AMBULATORY_CARE_PROVIDER_SITE_OTHER): Payer: Medicare Other | Admitting: Cardiovascular Disease

## 2013-03-27 ENCOUNTER — Encounter: Payer: Self-pay | Admitting: Cardiovascular Disease

## 2013-03-27 VITALS — BP 126/70 | HR 46 | Resp 16 | Ht 68.5 in | Wt 171.9 lb

## 2013-03-27 DIAGNOSIS — R001 Bradycardia, unspecified: Secondary | ICD-10-CM

## 2013-03-27 DIAGNOSIS — E785 Hyperlipidemia, unspecified: Secondary | ICD-10-CM

## 2013-03-27 DIAGNOSIS — I498 Other specified cardiac arrhythmias: Secondary | ICD-10-CM

## 2013-03-27 DIAGNOSIS — I25810 Atherosclerosis of coronary artery bypass graft(s) without angina pectoris: Secondary | ICD-10-CM

## 2013-03-27 DIAGNOSIS — I2510 Atherosclerotic heart disease of native coronary artery without angina pectoris: Secondary | ICD-10-CM

## 2013-03-27 DIAGNOSIS — I059 Rheumatic mitral valve disease, unspecified: Secondary | ICD-10-CM

## 2013-03-27 DIAGNOSIS — I341 Nonrheumatic mitral (valve) prolapse: Secondary | ICD-10-CM

## 2013-03-27 DIAGNOSIS — E039 Hypothyroidism, unspecified: Secondary | ICD-10-CM

## 2013-03-27 DIAGNOSIS — Z954 Presence of other heart-valve replacement: Secondary | ICD-10-CM

## 2013-03-27 DIAGNOSIS — I714 Abdominal aortic aneurysm, without rupture: Secondary | ICD-10-CM

## 2013-03-27 DIAGNOSIS — I10 Essential (primary) hypertension: Secondary | ICD-10-CM

## 2013-03-27 DIAGNOSIS — F0390 Unspecified dementia without behavioral disturbance: Secondary | ICD-10-CM

## 2013-03-27 DIAGNOSIS — Z952 Presence of prosthetic heart valve: Secondary | ICD-10-CM

## 2013-03-27 DIAGNOSIS — I447 Left bundle-branch block, unspecified: Secondary | ICD-10-CM

## 2013-03-27 MED ORDER — METOPROLOL SUCCINATE ER 50 MG PO TB24: 25.0000 mg | ORAL_TABLET | Freq: Every day | ORAL | Status: DC

## 2013-03-27 NOTE — Progress Notes (Signed)
Patient ID: [name], female   DOB: 12-28-1933, 79 y.o.   MRN: [id number] \    Reason for office visit Followup for aortic valve prosthesis, coronary artery disease, hypertension, hyperlipidemia abdominal aortic aneurysm  It has been about a year since [name] his last appointment and she comes today accompanied by her daughter. She remains asymptomatic from a cardiovascular standpoint and this is confirmed by her family. She is able to perform housework without any limitations. She has not had any new focal nonfocal events but her memory has deteriorated rapidly. There are some concerning findings that are worrisome for her ability to live independently according to her family.  In March of this year show 1 to repeat echocardiography and abdominal ultrasonography and carotid duplex studies. There are no meaningful obstructive lesions in the carotids although there is bilateral plaque. Abdominal aortic aneurysm is unchanged with a maximum diameter of 3.5 cm. The aortic valve prosthesis appears to be functioning normally. There is normal left ventricular systolic function and normal regional wall motion. There is evidence of diastolic    Allergies  Allergen Reactions  . Codeine     Unknown  . Morphine And Related     Unknown    Current Outpatient Prescriptions  Medication Sig Dispense Refill  . albuterol (PROVENTIL HFA;VENTOLIN HFA) 108 (90 BASE) MCG/ACT inhaler Inhale 2 puffs into the lungs every 6 (six) hours as needed for wheezing.      Marland Kitchen aspirin 81 MG chewable tablet Chew 162 mg by mouth daily.      . brinzolamide (AZOPT) 1 % ophthalmic suspension Place 1 drop into the left eye 2 (two) times daily.      . fluticasone (FLOVENT HFA) 110 MCG/ACT inhaler Inhale 1 puff into the lungs 2 (two) times daily as needed.      Marland Kitchen levothyroxine (SYNTHROID, LEVOTHROID) 50 MCG tablet Take 50 mcg by mouth daily before breakfast.      . lisinopril (PRINIVIL,ZESTRIL) 40 MG tablet Take 40 mg by  mouth daily.      Marland Kitchen LOSARTAN POTASSIUM PO Take by mouth.      . metoprolol succinate (TOPROL-XL) 50 MG 24 hr tablet Take 1 tablet (50 mg total) by mouth daily. Take with or immediately following a meal.      . Multiple Vitamin (MULTIVITAMIN) tablet Take 1 tablet by mouth daily.      . potassium chloride SA (K-DUR,KLOR-CON) 20 MEQ tablet Take 20 mEq by mouth daily.      . simvastatin (ZOCOR) 40 MG tablet Take 40 mg by mouth every evening.      . tolterodine (DETROL LA) 2 MG 24 hr capsule Take 2 mg by mouth daily.      Marland Kitchen triamterene-hydrochlorothiazide (MAXZIDE-25) 37.5-25 MG per tablet Take 1 tablet by mouth daily.      . memantine (NAMENDA) 10 MG tablet Take 10 mg by mouth daily.       No current facility-administered medications for this visit.    Past Medical History  Diagnosis Date  . Asthma   . Hypertension   . Coronary artery disease     CABG 03/29/08  . Stroke   . Aortic stenosis 03/29/2008    biological prosthetic replacement  . Dyslipidemia   . Dementia   . AAA (abdominal aortic aneurysm) 01/03/13    3.5x3.3cm  . LBBB (left bundle branch block)     Past Surgical History  Procedure Laterality Date  . Coronary artery bypass graft  03/29/2008  LIMA TO LAD,SVG TO CX,SVG TO LEFT POSTEROLATERAL BRANCH  . Aortic valve replacement  03/29/2008    PERICARDIAL TISSUE VALVE  . Uterine fibroid surgery  1960's  . Abdominal hysterectomy  1977  . Retinal detachment surgery  1994  . Cataract extraction      left eye    Family History  Problem Relation Age of Onset  . Heart failure Mother   . Diabetes Mother   . Hypertension Mother   . Hyperlipidemia Mother     History   Social History  . Marital Status: Divorced    Spouse Name: N/A    Number of Children: N/A  . Years of Education: N/A   Occupational History  . Not on file.   Social History Main Topics  . Smoking status: Never Smoker   . Smokeless tobacco: Not on file  . Alcohol Use: No  . Drug Use: No  . Sexually  Active:    Other Topics Concern  . Not on file   Social History Narrative  . No narrative on file    Review of systems: The patient specifically denies any chest pain at rest exertion, dyspnea at rest or with exertion, orthopnea, paroxysmal nocturnal dyspnea, syncope, palpitations, focal neurological deficits, intermittent claudication, lower extremity edema, unexplained weight gain, cough, hemoptysis or wheezing.   PHYSICAL EXAM BP 126/70  Pulse 46  Resp 16  Ht 5' 8.5" (1.74 m)  Wt 77.973 kg (171 lb 14.4 oz)  BMI 25.75 kg/m2  General: Alert, oriented x3, no distress Head: no evidence of trauma, PERRL, EOMI, no exophtalmos or lid lag, no myxedema, no xanthelasma; normal ears, nose and oropharynx Neck: normal jugular venous pulsations and no hepatojugular reflux; brisk carotid pulses without delay and soft bilateral carotid bruits Chest: clear to auscultation, no signs of consolidation by percussion or palpation, normal fremitus, symmetrical and full respiratory excursions; well-healed sternotomy scar Cardiovascular: normal position and quality of the apical impulse, regular rhythm, normal first and second heart sounds, 2/6 early peaking systolic ejection murmur is heard but there are no rubs or gallops; there is no murmur at the apex, a systolic click is heard Abdomen: no tenderness or distention, no masses by palpation, no abnormal pulsatility or arterial bruits, normal bowel sounds, no hepatosplenomegaly Extremities: no clubbing, cyanosis or edema; 2+ radial, ulnar and brachial pulses bilaterally; 2+ right femoral, posterior tibial and dorsalis pedis pulses; 2+ left femoral, posterior tibial and dorsalis pedis pulses; no subclavian or femoral bruits Neurological: grossly nonfocal   EKG: Marked sinus bradycardia, left axis deviation likely representing left anterior fascicular block, incomplete right bundle-branch block; little change from last year  Lipid Panel 2012 LDL 76, HDL 50,  trig is present in 9, cholesterol 146   BMET    Component Value Date/Time   NA 141 04/28/2008 1310   K 3.6 04/28/2008 1310   CL 106 04/28/2008 1310   CO2 28 04/28/2008 1310   GLUCOSE 95 04/28/2008 1310   BUN 12 04/28/2008 1310   CREATININE 0.97 04/28/2008 1310   CALCIUM 8.9 04/28/2008 1310   GFRNONAA 57* 04/28/2008 1310   GFRAA  Value: >60        The eGFR has been calculated using the MDRD equation. This calculation has not been validated in all clinical 04/28/2008 1310     ASSESSMENT AND PLAN Bradycardia Likely related to a combination of age related sinus node dysfunction, beta blocker effect, anticholinesterase effect. This is not clearly symptomatic but I think is wise to reduc  e or discontinue her beta blocker. She will do this gradually by reducing the dose in half, after which we will reevaluate whether or not she should stay on this medication. At her upcoming visit with Dr. Duaine Dredge, he may choose to discontinue the Toprol altogether.  AAA By her most recent ultrasound performed in March of this year the maximum aneurysm diameter is only 3.5 cm , unchanged from previous evaluation. It is unlikely that this will progress rapidly even if we discontinue her beta blockers  S/P AVR 2009 [facility] Ease 21 mm bovine pericardial By echocardiogram performed in March 2013 this is functioning normally. Even though the echo reports that the prostatic valve gradients have increased they remained within normal range for a 21 mm valve. She does not have any clinical findings to suggest aortic stenosis. Nevertheless followup in 1 year is indicated to make sure the there is not any sign of rapid dysfunction of this prosthetic valve  CAD (coronary artery disease) s/p CABG - 2009 (LIMA to LAD, SVG to LCX, SVG to L-PLV Asymptomatic/angina free, within 5 years from bypass surgery  HTN (hypertension) Well-controlled today. This will have to be reevaluated once she discontinues beta blocker  therapy  Hyperlipidemia The most recent lipid profile that I have available for review show satisfactory levels.  MVP (mitral valve prolapse) with mild mitral insufficiency By most recent echocardiogram there is only mild mitral insufficiency, unlikely to be clinically relevant.  Dementia This undoubtedly seems to be progressing and I am very pleased that she is accompanied to the office upon by family today. I was worried about her ability to be compliant with medication. Pharmacological therapy should be limited to once daily medications if possible.  Hypothyroidism Clinically euthyroid. No recent labs that I can review at this time   Orders Placed This Encounter  Procedures  . EKG 12-Lead  . 2D Echocardiogram with contrast   Meds ordered this encounter  Medications  . metoprolol succinate (TOPROL-XL) 50 MG 24 hr tablet    Sig: Take 1 tablet (50 mg total) by mouth daily. Take with or immediately following a meal.    Kellsie Grindle  [name], MD, [facility] [phone] office [phone] pager

## 2013-03-27 NOTE — Patient Instructions (Addendum)
Decrease Metoprolol to 1/2 tablet daily (25 mg daily) Dr. Duaine Dredge may decide to cut the strength back even further if your heart rate is still low at your upcoming appointment Your physician recommends that you schedule a follow-up appointment in: 1 year. We will schedule an echo to precede that appointment.

## 2013-04-02 DIAGNOSIS — I714 Abdominal aortic aneurysm, without rupture, unspecified: Secondary | ICD-10-CM | POA: Insufficient documentation

## 2013-04-02 DIAGNOSIS — I341 Nonrheumatic mitral (valve) prolapse: Secondary | ICD-10-CM | POA: Insufficient documentation

## 2013-04-02 DIAGNOSIS — I10 Essential (primary) hypertension: Secondary | ICD-10-CM | POA: Insufficient documentation

## 2013-04-02 DIAGNOSIS — F0390 Unspecified dementia without behavioral disturbance: Secondary | ICD-10-CM | POA: Insufficient documentation

## 2013-04-02 DIAGNOSIS — Z952 Presence of prosthetic heart valve: Secondary | ICD-10-CM | POA: Insufficient documentation

## 2013-04-02 DIAGNOSIS — E785 Hyperlipidemia, unspecified: Secondary | ICD-10-CM | POA: Insufficient documentation

## 2013-04-02 DIAGNOSIS — R001 Bradycardia, unspecified: Secondary | ICD-10-CM | POA: Insufficient documentation

## 2013-04-02 DIAGNOSIS — I2510 Atherosclerotic heart disease of native coronary artery without angina pectoris: Secondary | ICD-10-CM | POA: Insufficient documentation

## 2013-04-02 DIAGNOSIS — E039 Hypothyroidism, unspecified: Secondary | ICD-10-CM | POA: Insufficient documentation

## 2013-04-02 NOTE — Assessment & Plan Note (Addendum)
The most recent lipid profile that I have available for review show satisfactory levels.

## 2013-04-02 NOTE — Assessment & Plan Note (Addendum)
Likely related to a combination of age related sinus node dysfunction, beta blocker effect, anticholinesterase effect. This is not clearly symptomatic but I think is wise to reduc e or discontinue her beta blocker. She will do this gradually by reducing the dose in half, after which we will reevaluate whether or not she should stay on this medication. At her upcoming visit with Dr. Duaine Dredge, he may choose to discontinue the Toprol altogether.

## 2013-04-02 NOTE — Assessment & Plan Note (Signed)
Well-controlled today. This will have to be reevaluated once she discontinues beta blocker therapy

## 2013-04-02 NOTE — Assessment & Plan Note (Signed)
Clinically euthyroid. No recent labs that I can review at this time

## 2013-04-02 NOTE — Assessment & Plan Note (Signed)
By echocardiogram performed in March 2013 this is functioning normally. Even though the echo reports that the prostatic valve gradients have increased they remained within normal range for a 21 mm valve. She does not have any clinical findings to suggest aortic stenosis. Nevertheless followup in 1 year is indicated to make sure the there is not any sign of rapid dysfunction of this prosthetic valve

## 2013-04-02 NOTE — Assessment & Plan Note (Signed)
By most recent echocardiogram there is only mild mitral insufficiency, unlikely to be clinically relevant.

## 2013-04-02 NOTE — Assessment & Plan Note (Signed)
This undoubtedly seems to be progressing and I am very pleased that she is accompanied to the office upon by family today. I was worried about her ability to be compliant with medication. Pharmacological therapy should be limited to once daily medications if possible.

## 2013-04-02 NOTE — Assessment & Plan Note (Signed)
Asymptomatic/angina free, within 5 years from bypass surgery

## 2013-04-02 NOTE — Assessment & Plan Note (Signed)
By her most recent ultrasound performed in March of this year the maximum aneurysm diameter is only 3.5 cm , unchanged from previous evaluation. It is unlikely that this will progress rapidly even if we discontinue her beta blockers

## 2013-08-17 ENCOUNTER — Emergency Department (HOSPITAL_COMMUNITY): Payer: Medicare Other

## 2013-08-17 ENCOUNTER — Other Ambulatory Visit: Payer: Self-pay

## 2013-08-17 ENCOUNTER — Encounter (HOSPITAL_COMMUNITY): Payer: Self-pay | Admitting: Emergency Medicine

## 2013-08-17 ENCOUNTER — Observation Stay (HOSPITAL_COMMUNITY)
Admission: EM | Admit: 2013-08-17 | Discharge: 2013-08-18 | Disposition: A | Discharge status: Home / Self Care | Payer: Medicare Other | Attending: Internal Medicine | Admitting: Internal Medicine

## 2013-08-17 DIAGNOSIS — R531 Weakness: Secondary | ICD-10-CM

## 2013-08-17 DIAGNOSIS — R011 Cardiac murmur, unspecified: Secondary | ICD-10-CM | POA: Insufficient documentation

## 2013-08-17 DIAGNOSIS — I6350 Cerebral infarction due to unspecified occlusion or stenosis of unspecified cerebral artery: Secondary | ICD-10-CM | POA: Insufficient documentation

## 2013-08-17 DIAGNOSIS — Z862 Personal history of diseases of the blood and blood-forming organs and certain disorders involving the immune mechanism: Secondary | ICD-10-CM | POA: Insufficient documentation

## 2013-08-17 DIAGNOSIS — R001 Bradycardia, unspecified: Secondary | ICD-10-CM

## 2013-08-17 DIAGNOSIS — Z8639 Personal history of other endocrine, nutritional and metabolic disease: Secondary | ICD-10-CM | POA: Insufficient documentation

## 2013-08-17 DIAGNOSIS — Z7982 Long term (current) use of aspirin: Secondary | ICD-10-CM | POA: Insufficient documentation

## 2013-08-17 DIAGNOSIS — E785 Hyperlipidemia, unspecified: Secondary | ICD-10-CM

## 2013-08-17 DIAGNOSIS — I2510 Atherosclerotic heart disease of native coronary artery without angina pectoris: Secondary | ICD-10-CM | POA: Insufficient documentation

## 2013-08-17 DIAGNOSIS — J45909 Unspecified asthma, uncomplicated: Secondary | ICD-10-CM | POA: Insufficient documentation

## 2013-08-17 DIAGNOSIS — IMO0002 Reserved for concepts with insufficient information to code with codable children: Secondary | ICD-10-CM | POA: Insufficient documentation

## 2013-08-17 DIAGNOSIS — R1110 Vomiting, unspecified: Secondary | ICD-10-CM | POA: Insufficient documentation

## 2013-08-17 DIAGNOSIS — G309 Alzheimer's disease, unspecified: Secondary | ICD-10-CM | POA: Insufficient documentation

## 2013-08-17 DIAGNOSIS — M129 Arthropathy, unspecified: Secondary | ICD-10-CM | POA: Insufficient documentation

## 2013-08-17 DIAGNOSIS — Z23 Encounter for immunization: Secondary | ICD-10-CM | POA: Insufficient documentation

## 2013-08-17 DIAGNOSIS — Z954 Presence of other heart-valve replacement: Secondary | ICD-10-CM | POA: Insufficient documentation

## 2013-08-17 DIAGNOSIS — Z79899 Other long term (current) drug therapy: Secondary | ICD-10-CM | POA: Insufficient documentation

## 2013-08-17 DIAGNOSIS — I341 Nonrheumatic mitral (valve) prolapse: Secondary | ICD-10-CM

## 2013-08-17 DIAGNOSIS — Z952 Presence of prosthetic heart valve: Secondary | ICD-10-CM

## 2013-08-17 DIAGNOSIS — F0280 Dementia in other diseases classified elsewhere without behavioral disturbance: Secondary | ICD-10-CM | POA: Insufficient documentation

## 2013-08-17 DIAGNOSIS — E039 Hypothyroidism, unspecified: Secondary | ICD-10-CM | POA: Insufficient documentation

## 2013-08-17 DIAGNOSIS — Z951 Presence of aortocoronary bypass graft: Secondary | ICD-10-CM | POA: Insufficient documentation

## 2013-08-17 DIAGNOSIS — R55 Syncope and collapse: Principal | ICD-10-CM | POA: Insufficient documentation

## 2013-08-17 DIAGNOSIS — E876 Hypokalemia: Secondary | ICD-10-CM

## 2013-08-17 DIAGNOSIS — I10 Essential (primary) hypertension: Secondary | ICD-10-CM | POA: Insufficient documentation

## 2013-08-17 DIAGNOSIS — G43909 Migraine, unspecified, not intractable, without status migrainosus: Secondary | ICD-10-CM | POA: Insufficient documentation

## 2013-08-17 DIAGNOSIS — F0390 Unspecified dementia without behavioral disturbance: Secondary | ICD-10-CM | POA: Diagnosis present

## 2013-08-17 DIAGNOSIS — I059 Rheumatic mitral valve disease, unspecified: Secondary | ICD-10-CM | POA: Insufficient documentation

## 2013-08-17 DIAGNOSIS — I498 Other specified cardiac arrhythmias: Secondary | ICD-10-CM

## 2013-08-17 DIAGNOSIS — R5381 Other malaise: Secondary | ICD-10-CM | POA: Insufficient documentation

## 2013-08-17 DIAGNOSIS — I4460 Unspecified fascicular block: Secondary | ICD-10-CM | POA: Insufficient documentation

## 2013-08-17 DIAGNOSIS — I714 Abdominal aortic aneurysm, without rupture: Secondary | ICD-10-CM

## 2013-08-17 HISTORY — DX: Migraine, unspecified, not intractable, without status migrainosus: G43.909

## 2013-08-17 HISTORY — DX: Cardiac murmur, unspecified: R01.1

## 2013-08-17 HISTORY — DX: Alzheimer's disease, unspecified: G30.9

## 2013-08-17 HISTORY — DX: Shortness of breath: R06.02

## 2013-08-17 HISTORY — DX: Nonrheumatic mitral (valve) prolapse: I34.1

## 2013-08-17 HISTORY — DX: Syncope and collapse: R55

## 2013-08-17 HISTORY — DX: Hypothyroidism, unspecified: E03.9

## 2013-08-17 HISTORY — DX: Dementia in other diseases classified elsewhere, unspecified severity, without behavioral disturbance, psychotic disturbance, mood disturbance, and anxiety: F02.80

## 2013-08-17 HISTORY — DX: Unspecified osteoarthritis, unspecified site: M19.90

## 2013-08-17 HISTORY — DX: Other specified postprocedural states: Z98.890

## 2013-08-17 HISTORY — DX: Anemia, unspecified: D64.9

## 2013-08-17 HISTORY — DX: Nausea with vomiting, unspecified: R11.2

## 2013-08-17 LAB — COMPREHENSIVE METABOLIC PANEL
ALT: 14 U/L (ref 0–35)
Albumin: 3.9 g/dL (ref 3.5–5.2)
BUN: 19 mg/dL (ref 6–23)
CO2: 27 mEq/L (ref 19–32)
Calcium: 9.3 mg/dL (ref 8.4–10.5)
Creatinine, Ser: 1.22 mg/dL — ABNORMAL HIGH (ref 0.50–1.10)
GFR calc Af Amer: 49 mL/min — ABNORMAL LOW (ref >90)
GFR calc non Af Amer: 42 mL/min — ABNORMAL LOW (ref >90)
Glucose, Bld: 155 mg/dL — ABNORMAL HIGH (ref 70–99)
Potassium: 3 mEq/L — ABNORMAL LOW (ref 3.5–5.1)
Total Bilirubin: 0.7 mg/dL (ref 0.3–1.2)

## 2013-08-17 LAB — CBC WITH DIFFERENTIAL/PLATELET
Basophils Absolute: 0 10*3/uL (ref 0.0–0.1)
Basophils Relative: 0 % (ref 0–1)
Eosinophils Relative: 1 % (ref 0–5)
HCT: 37.1 % (ref 36.0–46.0)
Hemoglobin: 13.6 g/dL (ref 12.0–15.0)
Lymphocytes Relative: 18 % (ref 12–46)
Lymphs Abs: 1.1 10*3/uL (ref 0.7–4.0)
MCV: 87.5 fL (ref 78.0–100.0)
Monocytes Absolute: 0.5 10*3/uL (ref 0.1–1.0)
Monocytes Relative: 8 % (ref 3–12)
Neutrophils Relative %: 73 % (ref 43–77)
RDW: 12.6 % (ref 11.5–15.5)
WBC: 6.1 10*3/uL (ref 4.0–10.5)

## 2013-08-17 LAB — URINALYSIS, ROUTINE W REFLEX MICROSCOPIC
Leukocytes, UA: NEGATIVE
Nitrite: NEGATIVE
Protein, ur: NEGATIVE mg/dL
Specific Gravity, Urine: 1.015 (ref 1.005–1.030)
Urobilinogen, UA: 0.2 mg/dL (ref 0.0–1.0)

## 2013-08-17 LAB — CBC
Hemoglobin: 12.8 g/dL (ref 12.0–15.0)
MCHC: 35.6 g/dL (ref 30.0–36.0)
Platelets: 158 10*3/uL (ref 150–400)
RDW: 12.8 % (ref 11.5–15.5)
WBC: 6.5 10*3/uL (ref 4.0–10.5)

## 2013-08-17 LAB — PRO B NATRIURETIC PEPTIDE: Pro B Natriuretic peptide (BNP): 456.3 pg/mL — ABNORMAL HIGH (ref 0–450)

## 2013-08-17 LAB — TROPONIN I: Troponin I: <0.3 ng/mL (ref <0.30)

## 2013-08-17 LAB — CREATININE, SERUM
GFR calc Af Amer: 56 mL/min — ABNORMAL LOW (ref >90)
GFR calc non Af Amer: 49 mL/min — ABNORMAL LOW (ref >90)

## 2013-08-17 LAB — LACTIC ACID, PLASMA: Lactic Acid, Venous: 1.7 mmol/L (ref 0.5–2.2)

## 2013-08-17 MED ORDER — LEVOTHYROXINE SODIUM 50 MCG PO TABS
50.0000 ug | ORAL_TABLET | Freq: Every day | ORAL | Status: DC
  Administered 2013-08-17: 50 ug via ORAL
  Filled 2013-08-17 (×2): qty 1

## 2013-08-17 MED ORDER — HEPARIN SODIUM (PORCINE) 5000 UNIT/ML IJ SOLN
5000.0000 [IU] | Freq: Three times a day (TID) | INTRAMUSCULAR | Status: DC
  Administered 2013-08-17: 5000 [IU] via SUBCUTANEOUS
  Filled 2013-08-17 (×5): qty 1

## 2013-08-17 MED ORDER — ASPIRIN 81 MG PO CHEW
162.0000 mg | CHEWABLE_TABLET | Freq: Every day | ORAL | Status: DC
  Administered 2013-08-18: 162 mg via ORAL
  Filled 2013-08-17: qty 2

## 2013-08-17 MED ORDER — FLUTICASONE PROPIONATE HFA 110 MCG/ACT IN AERO
1.0000 | INHALATION_SPRAY | Freq: Two times a day (BID) | RESPIRATORY_TRACT | Status: DC | PRN
  Filled 2013-08-17 (×2): qty 12

## 2013-08-17 MED ORDER — BRINZOLAMIDE 1 % OP SUSP
1.0000 [drp] | Freq: Two times a day (BID) | OPHTHALMIC | Status: DC
  Administered 2013-08-17 – 2013-08-18 (×2): 1 [drp] via OPHTHALMIC
  Filled 2013-08-17: qty 10

## 2013-08-17 MED ORDER — TRIAMTERENE-HCTZ 37.5-25 MG PO TABS
1.0000 | ORAL_TABLET | Freq: Every day | ORAL | Status: DC
  Administered 2013-08-18: 1 via ORAL
  Filled 2013-08-17: qty 1

## 2013-08-17 MED ORDER — SODIUM CHLORIDE 0.9 % IV SOLN: INTRAVENOUS | Status: AC

## 2013-08-17 MED ORDER — LOSARTAN POTASSIUM 50 MG PO TABS
100.0000 mg | ORAL_TABLET | Freq: Every day | ORAL | Status: DC
  Administered 2013-08-18: 100 mg via ORAL
  Filled 2013-08-17: qty 2

## 2013-08-17 MED ORDER — INFLUENZA VAC SPLIT QUAD 0.5 ML IM SUSP
0.5000 mL | INTRAMUSCULAR | Status: AC
  Administered 2013-08-18: 0.5 mL via INTRAMUSCULAR
  Filled 2013-08-17: qty 0.5

## 2013-08-17 MED ORDER — OXYCODONE HCL 5 MG PO TABS: 5.0000 mg | ORAL_TABLET | ORAL | Status: DC | PRN

## 2013-08-17 MED ORDER — ALBUTEROL SULFATE HFA 108 (90 BASE) MCG/ACT IN AERS
2.0000 | INHALATION_SPRAY | Freq: Four times a day (QID) | RESPIRATORY_TRACT | Status: DC | PRN
  Filled 2013-08-17: qty 6.7

## 2013-08-17 MED ORDER — SIMVASTATIN 40 MG PO TABS
40.0000 mg | ORAL_TABLET | Freq: Every evening | ORAL | Status: DC
  Administered 2013-08-17 – 2013-08-18 (×2): 40 mg via ORAL
  Filled 2013-08-17 (×2): qty 1

## 2013-08-17 MED ORDER — POTASSIUM CHLORIDE CRYS ER 20 MEQ PO TBCR
20.0000 meq | EXTENDED_RELEASE_TABLET | Freq: Every day | ORAL | Status: DC
  Filled 2013-08-17: qty 1

## 2013-08-17 MED ORDER — ONDANSETRON HCL 4 MG/2ML IJ SOLN
4.0000 mg | Freq: Once | INTRAMUSCULAR | Status: AC
  Administered 2013-08-17: 4 mg via INTRAVENOUS
  Filled 2013-08-17: qty 2

## 2013-08-17 MED ORDER — SODIUM CHLORIDE 0.9 % IJ SOLN
3.0000 mL | Freq: Two times a day (BID) | INTRAMUSCULAR | Status: DC
  Administered 2013-08-18: 3 mL via INTRAVENOUS

## 2013-08-17 MED ORDER — ACETAMINOPHEN 650 MG RE SUPP: 650.0000 mg | Freq: Four times a day (QID) | RECTAL | Status: DC | PRN

## 2013-08-17 MED ORDER — LATANOPROST 0.005 % OP SOLN
1.0000 [drp] | Freq: Every day | OPHTHALMIC | Status: DC
  Administered 2013-08-17: 1 [drp] via OPHTHALMIC
  Filled 2013-08-17: qty 2.5

## 2013-08-17 MED ORDER — POTASSIUM CHLORIDE 20 MEQ/15ML (10%) PO LIQD
40.0000 meq | Freq: Once | ORAL | Status: DC
  Filled 2013-08-17: qty 30

## 2013-08-17 MED ORDER — POTASSIUM CHLORIDE 10 MEQ/100ML IV SOLN
10.0000 meq | INTRAVENOUS | Status: AC
  Administered 2013-08-17 (×2): 10 meq via INTRAVENOUS
  Filled 2013-08-17 (×2): qty 100

## 2013-08-17 MED ORDER — SODIUM CHLORIDE 0.9 % IV BOLUS (SEPSIS)
500.0000 mL | Freq: Once | INTRAVENOUS | Status: AC
  Administered 2013-08-17: 500 mL via INTRAVENOUS

## 2013-08-17 MED ORDER — FESOTERODINE FUMARATE ER 4 MG PO TB24
4.0000 mg | ORAL_TABLET | Freq: Every day | ORAL | Status: DC
  Administered 2013-08-17 – 2013-08-18 (×2): 4 mg via ORAL
  Filled 2013-08-17 (×2): qty 1

## 2013-08-17 MED ORDER — ACETAMINOPHEN 325 MG PO TABS: 650.0000 mg | ORAL_TABLET | Freq: Four times a day (QID) | ORAL | Status: DC | PRN

## 2013-08-17 MED ORDER — STROKE: EARLY STAGES OF RECOVERY BOOK
Freq: Once | Status: AC
  Administered 2013-08-17: 06:00:00
  Filled 2013-08-17: qty 1

## 2013-08-17 NOTE — ED Notes (Signed)
MD at bedside. Dr. Wofford. 

## 2013-08-17 NOTE — H&P (Signed)
[facility] History and Physical  [name] ZOX:[id number] DOB: [birth year] DOA: 08/17/2013  Referring physician:  PCP: Carolyne Fiscal, MD  Specialists:   Chief Complaint: "Near passing out" and weakness  HPI: [name] is a 79 y.o. female  With a history of coronary disease status post CABG, aortic valve replacement with porcine valve, hypertension, TIA, Alzheimer's dementia that presents to the emergency department for almost passing out today and feeling weak. Patient states she woke up early this morning and went to go volunteer. While she was there patient states that she started to feel not well and insight to sit down. She didn't they felt that she was want to pass out so she laid herself down before. Patient did not actually lose consciousness. Of note patient does state that she is not eaten or had anything to drink all day.  She also states that she had several episodes of vomiting today as well. Patient denies any sick contacts. Patient denies any change in appetite denies any fever, abdominal pain, current nausea, chest pain, dizziness, headache.    Review of Systems:  Constitutional: Denies fever, chills, diaphoresis, appetite change and fatigue.  HEENT: Denies photophobia, eye pain, redness, hearing loss, ear pain, congestion, sore throat, rhinorrhea, sneezing, mouth sores, trouble swallowing, neck pain, neck stiffness and tinnitus.   Respiratory: Denies SOB, DOE, cough, chest tightness,  and wheezing.   Cardiovascular: Denies chest pain, palpitations and leg swelling.  Gastrointestinal: Denies abdominal pain, diarrhea, constipation, blood in stool and abdominal distention.  Genitourinary: Denies dysuria, urgency, frequency, hematuria, flank pain and difficulty urinating.  Musculoskeletal: Denies myalgias, back pain, joint swelling, arthralgias and gait problem.  Skin: Denies pallor, rash and wound.  Neurological: Denies dizziness, seizures, light-headedness,  numbness and headaches.  Hematological: Denies adenopathy. Easy bruising, personal or family bleeding history  Psychiatric/Behavioral: Denies suicidal ideation, mood changes, confusion, nervousness, sleep disturbance and agitation  Past Medical History  Diagnosis Date  . Asthma   . Hypertension   . Coronary artery disease     CABG 03/29/08  . Stroke   . Aortic stenosis 03/29/2008    biological prosthetic replacement  . Dyslipidemia   . Dementia   . AAA (abdominal aortic aneurysm) 01/03/13    3.5x3.3cm  . LBBB (left bundle branch block)    Past Surgical History  Procedure Laterality Date  . Coronary artery bypass graft  03/29/2008    LIMA TO LAD,SVG TO CX,SVG TO LEFT POSTEROLATERAL BRANCH  . Aortic valve replacement  03/29/2008    PERICARDIAL TISSUE VALVE  . Uterine fibroid surgery  1960's  . Abdominal hysterectomy  1977  . Retinal detachment surgery  1994  . Cataract extraction      left eye   Social History:  reports that she has never smoked. She does not have any smokeless tobacco history on file. She reports that she does not drink alcohol or use illicit drugs. Lives with her daughter and is able to care out her daily activities.  Allergies  Allergen Reactions  . Codeine     Unknown  . Morphine And Related     Unknown    Family History  Problem Relation Age of Onset  . Heart failure Mother   . Diabetes Mother   . Hypertension Mother   . Hyperlipidemia Mother     Prior to Admission medications   Medication Sig Start Date End Date Taking? Authorizing Provider  albuterol (PROVENTIL HFA;VENTOLIN HFA) 108 (90 BASE) MCG/ACT inhaler Inhale 2 puffs into  the lungs every 6 (six) hours as needed for wheezing.   Yes Historical Provider, MD  aspirin 81 MG chewable tablet Chew 162 mg by mouth daily.   Yes Historical Provider, MD  brinzolamide (AZOPT) 1 % ophthalmic suspension Place 1 drop into the left eye 2 (two) times daily.   Yes Historical Provider, MD  Calcium  Carb-Cholecalciferol (CALCIUM 600 + D PO) Take 1 tablet by mouth daily.   Yes Historical Provider, MD  fluticasone (FLOVENT HFA) 110 MCG/ACT inhaler Inhale 1 puff into the lungs 2 (two) times daily as needed.   Yes Historical Provider, MD  latanoprost (XALATAN) 0.005 % ophthalmic solution Place 1 drop into the left eye at bedtime.   Yes Historical Provider, MD  levothyroxine (SYNTHROID, LEVOTHROID) 50 MCG tablet Take 50 mcg by mouth daily before breakfast.   Yes Historical Provider, MD  losartan (COZAAR) 100 MG tablet Take 100 mg by mouth daily.   Yes Historical Provider, MD  metoprolol succinate (TOPROL-XL) 50 MG 24 hr tablet Take 1 tablet (50 mg total) by mouth daily. Take with or immediately following a meal. 03/27/13  Yes Mihai Croitoru, MD  potassium chloride SA (K-DUR,KLOR-CON) 20 MEQ tablet Take 20 mEq by mouth daily.   Yes Historical Provider, MD  simvastatin (ZOCOR) 40 MG tablet Take 40 mg by mouth every evening.   Yes Historical Provider, MD  tolterodine (DETROL LA) 2 MG 24 hr capsule Take 2 mg by mouth daily.   Yes Historical Provider, MD  triamterene-hydrochlorothiazide (MAXZIDE-25) 37.5-25 MG per tablet Take 1 tablet by mouth daily.   Yes Historical Provider, MD   Physical Exam: Filed Vitals:   08/17/13 1737  BP:   Pulse:   Temp: 98.1 F (36.7 C)  Resp:      General: Well developed, well nourished, NAD, appears stated age  HEENT: NCAT, PERRLA, EOMI, Anicteic Sclera, mucous membranes dry. No pharyngeal erythema or exudates  Neck: Supple, no JVD, no masses,  Cardiovascular: S1 S2 auscultated, bradycardic.  Respiratory: Clear to auscultation bilaterally with equal chest rise  Abdomen: Soft, nontender, nondistended, + bowel sounds  Extremities: warm dry without cyanosis clubbing or edema  Neuro: AAOx3, cranial nerves grossly intact. Strength 5/5 in patient's upper and lower extremities bilaterally.  Skin: Without rashes exudates or nodules  Psych: Normal affect and  demeanor with intact judgement and insight  Labs on Admission:  Basic Metabolic Panel:  Recent Labs Lab 08/17/13 1346  NA 135  K 3.0*  CL 96  CO2 27  GLUCOSE 155*  BUN 19  CREATININE 1.22*  CALCIUM 9.3   Liver Function Tests:  Recent Labs Lab 08/17/13 1346  AST 22  ALT 14  ALKPHOS 63  BILITOT 0.7  PROT 6.8  ALBUMIN 3.9   No results found for this basename: LIPASE, AMYLASE,  in the last 168 hours No results found for this basename: AMMONIA,  in the last 168 hours CBC:  Recent Labs Lab 08/17/13 1346  WBC 6.1  NEUTROABS 4.5  HGB 13.6  HCT 37.1  MCV 87.5  PLT 161   Cardiac Enzymes:  Recent Labs Lab 08/17/13 1346  TROPONINI <0.30    BNP (last 3 results)  Recent Labs  08/17/13 1346  PROBNP 464.4*   CBG: No results found for this basename: GLUCAP,  in the last 168 hours  Radiological Exams on Admission: Ct Head Wo Contrast  08/17/2013   CLINICAL DATA:  Syncope  EXAM: CT HEAD WITHOUT CONTRAST  TECHNIQUE: Contiguous axial images were obtained from  the base of the skull through the vertex without intravenous contrast. Study was obtained within 24 hr of patient's arrival at the emergency department.  COMPARISON:  June 26, 2008  FINDINGS: There is mild diffuse atrophy. There is no mass, hemorrhage, extra-axial fluid collection, or midline shift. There is patchy small vessel disease in the centra semiovale bilaterally, stable. There is no acute appearing infarct on this study. There is no new gray-white compartment lesion.  Bony calvarium appears intact. The mastoid air cells are clear.  IMPRESSION: Mild atrophy with stable periventricular small vessel disease. There is no appreciable mass, hemorrhage, or acute appearing infarct.   Electronically Signed   By: Bretta Bang M.D.   On: 08/17/2013 14:41   Dg Chest Port 1 View  08/17/2013   CLINICAL DATA:  Weakness. Nausea and vomiting.  EXAM: PORTABLE CHEST - 1 VIEW  COMPARISON:  CHEST x-ray 08/05/2008.   FINDINGS: Possible trace left pleural effusion. No definite consolidative airspace disease. Mild diffuse peribronchial cuffing. No definite evidence of pulmonary edema. Heart size is mildly enlarged. Upper mediastinal contours are slightly distorted by patient positioning, but are otherwise within normal limits. Atherosclerosis in the thoracic aorta. Status post median sternotomy for CABG and aortic valve replacement (a bioprosthetic aortic valve is noted).  IMPRESSION: 1. Probable trace left pleural effusion. 2. Mild diffuse peribronchial cuffing. This may be chronic in this patient related to chronic bronchitis, or could indicate acute bronchitis. Clinical correlation is recommended. 3. Mild cardiomegaly. 4. Atherosclerosis. 5. Postoperative changes, as above.   Electronically Signed   By: Trudie Reed M.D.   On: 08/17/2013 14:17    EKG: Independently reviewed. Sinus bradycardia, rate 47, right bundle branch block with left anterior fascicular block.  Assessment/Plan Active Problems:   S/P AVR 2009 [facility] Ease 21 mm bovine pericardial   CAD (coronary artery disease) s/p CABG - 2009 (LIMA to LAD, SVG to LCX, SVG to L-PLV   HTN (hypertension)   Hyperlipidemia   MVP (mitral valve prolapse) with mild mitral insufficiency   Dementia   Hypothyroidism   Bradycardia   Hypokalemia   Near syncope  Near syncopal episode Patient will be admitted to the telemetry unit to monitor for any bradycardia or tachyarrhythmias.  She is currently bradycardic.  Patient also does seem to be somewhat dehydrated, will give gentle rehydration. Also obtain 2-D echocardiogram to assess her left ventricular function, carotid ultrasound. [facility] consult cardiology for further management. Patient has had aortic valve replacement in the past.  Will also order orthostatics.   Sinus bradycardia Possibly secondary to metoprolol. Will also check a TSH level. Will hold the Toprol at this time.  Hypertension Will  continue patient's home medications triamterene, hydrochlorothiazide, Losartan.  Flat on medications if needed.  Hyperlipidemia Will continue simvastatin.  Hypothyroidism Will continue Synthroid. Will also check TSH level.  Hypokalemia Likely secondary to her hypertension medications. Will continue her home medication of potassium chloride by medical 20 mEq tablet as well as give her 40 mEq. Will monitor her BMP.  Aortic valve replacement, stable  Coronary disease status post CABG Patient has no complaints of chest pain at this time, troponin negative.  DVT prophylaxis: Heparin  Code Status: Full  Condition: Guarded  Family Communication: Daughter at bedside. Admission, patients condition and plan of care including tests being ordered have been discussed with the patient and daughter who indicate understanding and agree with the plan and Code Status.  Disposition Plan: Admitted for observation  Time spent: 45 minutes  Dustan Hyams D.O. [facility] Pager [phone]  If 7PM-7AM, please contact night-coverage [email or web address] Password [facility] 08/17/2013, 5:49 PM

## 2013-08-17 NOTE — ED Notes (Signed)
Pt arrives [facility] from [employer]. Pt was volunteering, began feeling "different", pt states she walked to a table where she could sit down. Pt sat down and laid her head down. Continued feeling bad. Pt states she laid down on the floor. Denies CP, SOB, Abdominal pain, Headache. Pt with emesis.  EMS reports patient began vomiting while waiting for a bed. Pt vagaled and became bradycardic to 33.

## 2013-08-17 NOTE — ED Provider Notes (Signed)
CSN: [id number]     Arrival date & time 08/17/13  1315 History   First MD Initiated Contact with Patient 08/17/13 1326     Chief Complaint  Patient presents with  . Near Syncope  . Emesis   (Consider location/radiation/quality/duration/timing/severity/associated sxs/prior Treatment) Patient is a 79 y.o. female presenting with general illness.  Illness Quality:  Generalized weakness and malaise Severity:  Severe Onset quality:  Sudden Duration:  1 hour Timing:  Constant Progression:  Unchanged Chronicity:  New Context:  Pt felt well this morning, but while she was volunteering, she began to feel weak.  Went to sit down, but continued to feel worse.  She subsequently lay down on the floor and felt that she would pass out.   Relieved by:  Nothing Worsened by:  Nothing Associated symptoms: nausea and vomiting   Associated symptoms: no abdominal pain, no chest pain, no congestion, no cough, no diarrhea, no fever and no shortness of breath     Past Medical History  Diagnosis Date  . Asthma   . Hypertension   . Coronary artery disease     CABG 03/29/08  . Stroke   . Aortic stenosis 03/29/2008    biological prosthetic replacement  . Dyslipidemia   . Dementia   . AAA (abdominal aortic aneurysm) 01/03/13    3.5x3.3cm  . LBBB (left bundle branch block)    Past Surgical History  Procedure Laterality Date  . Coronary artery bypass graft  03/29/2008    LIMA TO LAD,SVG TO CX,SVG TO LEFT POSTEROLATERAL BRANCH  . Aortic valve replacement  03/29/2008    PERICARDIAL TISSUE VALVE  . Uterine fibroid surgery  1960's  . Abdominal hysterectomy  1977  . Retinal detachment surgery  1994  . Cataract extraction      left eye   Family History  Problem Relation Age of Onset  . Heart failure Mother   . Diabetes Mother   . Hypertension Mother   . Hyperlipidemia Mother    History  Substance Use Topics  . Smoking status: Never Smoker   . Smokeless tobacco: Not on file  . Alcohol Use: No   OB  History   Grav Para Term Preterm Abortions TAB SAB Ect Mult Living                 Review of Systems  Constitutional: Negative for fever.  HENT: Negative for congestion.   Respiratory: Negative for cough and shortness of breath.   Cardiovascular: Negative for chest pain.  Gastrointestinal: Positive for nausea and vomiting. Negative for abdominal pain and diarrhea.  All other systems reviewed and are negative.    Allergies  Codeine and Morphine and related  Home Medications   Current Outpatient Rx  Name  Route  Sig  Dispense  Refill  . albuterol (PROVENTIL HFA;VENTOLIN HFA) 108 (90 BASE) MCG/ACT inhaler   Inhalation   Inhale 2 puffs into the lungs every 6 (six) hours as needed for wheezing.         Marland Kitchen aspirin 81 MG chewable tablet   Oral   Chew 162 mg by mouth daily.         . brinzolamide (AZOPT) 1 % ophthalmic suspension   Left Eye   Place 1 drop into the left eye 2 (two) times daily.         . fluticasone (FLOVENT HFA) 110 MCG/ACT inhaler   Inhalation   Inhale 1 puff into the lungs 2 (two) times daily as needed.         Marland Kitchen  levothyroxine (SYNTHROID, LEVOTHROID) 50 MCG tablet   Oral   Take 50 mcg by mouth daily before breakfast.         . lisinopril (PRINIVIL,ZESTRIL) 40 MG tablet   Oral   Take 40 mg by mouth daily.         Marland Kitchen LOSARTAN POTASSIUM PO   Oral   Take by mouth.         . memantine (NAMENDA) 10 MG tablet   Oral   Take 10 mg by mouth daily.         . metoprolol succinate (TOPROL-XL) 50 MG 24 hr tablet   Oral   Take 1 tablet (50 mg total) by mouth daily. Take with or immediately following a meal.         . Multiple Vitamin (MULTIVITAMIN) tablet   Oral   Take 1 tablet by mouth daily.         . potassium chloride SA (K-DUR,KLOR-CON) 20 MEQ tablet   Oral   Take 20 mEq by mouth daily.         . simvastatin (ZOCOR) 40 MG tablet   Oral   Take 40 mg by mouth every evening.         . tolterodine (DETROL LA) 2 MG 24 hr  capsule   Oral   Take 2 mg by mouth daily.         Marland Kitchen triamterene-hydrochlorothiazide (MAXZIDE-25) 37.5-25 MG per tablet   Oral   Take 1 tablet by mouth daily.          BP 152/64  Pulse 49  Temp(Src) 97.3 F (36.3 C) (Oral)  Resp 16  Ht 5\' 8"  (1.727 m)  Wt 172 lb (78.019 kg)  BMI 26.16 kg/m2  SpO2 100% Physical Exam  Nursing note and vitals reviewed. Constitutional: She is oriented to person, place, and time. She appears well-developed and well-nourished. No distress.  Laying flat in bed, appears nauseated.     HENT:  Head: Normocephalic and atraumatic.  Mouth/Throat: Oropharynx is clear and moist.  Eyes: Conjunctivae are normal. Pupils are equal, round, and reactive to light. No scleral icterus.  Neck: Neck supple.  Cardiovascular: Regular rhythm, normal heart sounds and intact distal pulses.  Bradycardia present.   No murmur heard. Pulmonary/Chest: Effort normal and breath sounds normal. No stridor. No respiratory distress. She has no rales.  Abdominal: Soft. Bowel sounds are normal. She exhibits no distension. There is no tenderness. There is no rebound and no guarding.  Musculoskeletal: Normal range of motion. She exhibits no edema.  Neurological: She is alert and oriented to person, place, and time.  Skin: Skin is warm and dry. No rash noted.  Psychiatric: She has a normal mood and affect. Her behavior is normal.    ED Course  Procedures (including critical care time) Labs Review Labs Reviewed  COMPREHENSIVE METABOLIC PANEL - Abnormal; Notable for the following:    Potassium 3.0 (*)    Glucose, Bld 155 (*)    Creatinine, Ser 1.22 (*)    GFR calc non Af Amer 42 (*)    GFR calc Af Amer 49 (*)    All other components within normal limits  PRO B NATRIURETIC PEPTIDE - Abnormal; Notable for the following:    Pro B Natriuretic peptide (BNP) 464.4 (*)    All other components within normal limits  CBC WITH DIFFERENTIAL  URINALYSIS, ROUTINE W REFLEX MICROSCOPIC   LACTIC ACID, PLASMA  TROPONIN I   Imaging Review Ct Head Wo  Contrast  08/17/2013   CLINICAL DATA:  Syncope  EXAM: CT HEAD WITHOUT CONTRAST  TECHNIQUE: Contiguous axial images were obtained from the base of the skull through the vertex without intravenous contrast. Study was obtained within 24 hr of patient's arrival at the emergency department.  COMPARISON:  June 26, 2008  FINDINGS: There is mild diffuse atrophy. There is no mass, hemorrhage, extra-axial fluid collection, or midline shift. There is patchy small vessel disease in the centra semiovale bilaterally, stable. There is no acute appearing infarct on this study. There is no new gray-white compartment lesion.  Bony calvarium appears intact. The mastoid air cells are clear.  IMPRESSION: Mild atrophy with stable periventricular small vessel disease. There is no appreciable mass, hemorrhage, or acute appearing infarct.   Electronically Signed   By: Bretta Bang M.D.   On: 08/17/2013 14:41   Dg Chest Port 1 View  08/17/2013   CLINICAL DATA:  Weakness. Nausea and vomiting.  EXAM: PORTABLE CHEST - 1 VIEW  COMPARISON:  CHEST x-ray 08/05/2008.  FINDINGS: Possible trace left pleural effusion. No definite consolidative airspace disease. Mild diffuse peribronchial cuffing. No definite evidence of pulmonary edema. Heart size is mildly enlarged. Upper mediastinal contours are slightly distorted by patient positioning, but are otherwise within normal limits. Atherosclerosis in the thoracic aorta. Status post median sternotomy for CABG and aortic valve replacement (a bioprosthetic aortic valve is noted).  IMPRESSION: 1. Probable trace left pleural effusion. 2. Mild diffuse peribronchial cuffing. This may be chronic in this patient related to chronic bronchitis, or could indicate acute bronchitis. Clinical correlation is recommended. 3. Mild cardiomegaly. 4. Atherosclerosis. 5. Postoperative changes, as above.   Electronically Signed   By: Trudie Reed M.D.   On: 08/17/2013 14:17  All radiology studies independently viewed by me.     EKG Interpretation   None     EKG - sinus bradycardia, rate 47, LAD, RBBB, LAFB, since prior, RBBB is new.   MDM   1. Generalized weakness   2. Near syncope   3. Vomiting    79 yo female with sudden onset of malaise and lightheadedness.  She is a vague historian but denies headache, neck stiffness, chest pain, SOB, abdominal pain, diarrhea, flank pain, or urinary symptoms.  She is unable to sit up or stand up due to pre-syncope. Her vital signs show bradycardia with normal BP.  Her exam is remarkable only for generalized fatigue.  Labwork shows hypokalemia, which may be contributing to her symptoms.  Plan admission to internal medicine for further workup.      Candyce Churn, MD 08/17/13 [phone]

## 2013-08-18 DIAGNOSIS — R55 Syncope and collapse: Secondary | ICD-10-CM

## 2013-08-18 DIAGNOSIS — I2510 Atherosclerotic heart disease of native coronary artery without angina pectoris: Secondary | ICD-10-CM

## 2013-08-18 DIAGNOSIS — I359 Nonrheumatic aortic valve disorder, unspecified: Secondary | ICD-10-CM

## 2013-08-18 DIAGNOSIS — F0390 Unspecified dementia without behavioral disturbance: Secondary | ICD-10-CM

## 2013-08-18 DIAGNOSIS — I714 Abdominal aortic aneurysm, without rupture: Secondary | ICD-10-CM

## 2013-08-18 LAB — CBC
HCT: 34.7 % — ABNORMAL LOW (ref 36.0–46.0)
Hemoglobin: 12.2 g/dL (ref 12.0–15.0)
MCH: 31.3 pg (ref 26.0–34.0)
MCHC: 35.2 g/dL (ref 30.0–36.0)
MCV: 89 fL (ref 78.0–100.0)
RDW: 12.9 % (ref 11.5–15.5)

## 2013-08-18 LAB — MAGNESIUM: Magnesium: 2.1 mg/dL (ref 1.5–2.5)

## 2013-08-18 LAB — COMPREHENSIVE METABOLIC PANEL
ALT: 11 U/L (ref 0–35)
AST: 18 U/L (ref 0–37)
Albumin: 3.4 g/dL — ABNORMAL LOW (ref 3.5–5.2)
Alkaline Phosphatase: 56 U/L (ref 39–117)
Chloride: 99 mEq/L (ref 96–112)
Glucose, Bld: 108 mg/dL — ABNORMAL HIGH (ref 70–99)
Potassium: 3.1 mEq/L — ABNORMAL LOW (ref 3.5–5.1)
Sodium: 135 mEq/L (ref 135–145)
Total Protein: 6 g/dL (ref 6.0–8.3)

## 2013-08-18 LAB — TSH: TSH: 1.792 u[IU]/mL (ref 0.350–4.500)

## 2013-08-18 LAB — POTASSIUM: Potassium: 3.8 mEq/L (ref 3.5–5.1)

## 2013-08-18 MED ORDER — POTASSIUM CHLORIDE CRYS ER 20 MEQ PO TBCR
40.0000 meq | EXTENDED_RELEASE_TABLET | Freq: Once | ORAL | Status: AC
  Administered 2013-08-18: 40 meq via ORAL
  Filled 2013-08-18: qty 2

## 2013-08-18 MED ORDER — POTASSIUM CHLORIDE CRYS ER 20 MEQ PO TBCR: 40.0000 meq | EXTENDED_RELEASE_TABLET | Freq: Every day | ORAL | Status: DC

## 2013-08-18 MED ORDER — POTASSIUM CHLORIDE CRYS ER 20 MEQ PO TBCR: 20.0000 meq | EXTENDED_RELEASE_TABLET | Freq: Once | ORAL | Status: DC

## 2013-08-18 NOTE — Progress Notes (Signed)
Patient ID: [name]  female  ZOX:[id number]    DOB: 18-May-1934    DOA: 08/17/2013  PCP: Carolyne Fiscal, MD  Assessment/Plan: Principal Problem:   Near syncope likely due to bradycardia, vasovagal -Rule out for acute ACS, she was somewhat bradycardiac with rate in 40s at the time of admission. Upon reviewing prior note from Dr. Royann Shivers in 6/14, she was recommended to discontinue beta blocker after PCP followup. Patient's bradycardia was thought to be the combination of age-related sinus node dysfunction, beta blocker effect and anticholinesterase intact. Patient was recommended to decrease the Toprol-XL dose to 25 mg daily, then gradually off however it is noted that she is still on Toprol XL 50 mg dose. - 2-D echo pending, to assess the prostatic valve gradient and no signs of any dysfunction also called cardiology consultation, discussed with Dr. Tresa Endo  Sinus bradycardia  Possibly secondary to metoprolol. TSH is 1.79   Hypertension continue home medications triamterene, hydrochlorothiazide, Losartan.   Hyperlipidemia Will continue simvastatin.   Hypothyroidism continue Synthroid, TSH 1.7  Hypokalemia replaced likely due to her hypertension medications.   Aortic valve replacement, stable  Coronary disease status post CABG  Patient has no complaints of chest pain at this time, troponin negative.  DVT Prophylaxis:  Code Status:  Disposition: pending cardiology evaluation and echo results     Subjective: Denies any specific complaints, Pastor in the room, the patient denies any chest pain or shortness of breath, any dizziness or lightheadedness   Objective: Weight change:   Intake/Output Summary (Last 24 hours) at 08/18/13 1103 Last data filed at 08/18/13 0900  Gross per 24 hour  Intake    740 ml  Output      0 ml  Net    740 ml   Blood pressure 128/70, pulse 84, temperature 98.3 F (36.8 C), temperature source Oral, resp. rate 20, height 5\' 8"  (1.727 m), weight  80.3 kg (177 lb 0.5 oz), SpO2 98.00%.  Physical Exam: General: Alert and awake, oriented x3, not in any acute distress. CVS: S1-S2 clear, no murmur rubs or gallops Chest: clear to auscultation bilaterally, midline scar  Abdomen: soft nontender, nondistended, normal bowel sounds  Extremities: no cyanosis, clubbing or edema noted bilaterally Neuro: Cranial nerves II-XII intact, no focal neurological deficits  Lab Results: Basic Metabolic Panel:  Recent Labs Lab 08/17/13 1346 08/17/13 2115 08/18/13 0410  NA 135  --  135  K 3.0*  --  3.1*  CL 96  --  99  CO2 27  --  26  GLUCOSE 155*  --  108*  BUN 19  --  16  CREATININE 1.22* 1.08 1.13*  CALCIUM 9.3  --  8.6   Liver Function Tests:  Recent Labs Lab 08/17/13 1346 08/18/13 0410  AST 22 18  ALT 14 11  ALKPHOS 63 56  BILITOT 0.7 0.7  PROT 6.8 6.0  ALBUMIN 3.9 3.4*   No results found for this basename: LIPASE, AMYLASE,  in the last 168 hours No results found for this basename: AMMONIA,  in the last 168 hours CBC:  Recent Labs Lab 08/17/13 1346 08/17/13 2115 08/18/13 0410  WBC 6.1 6.5 5.6  NEUTROABS 4.5  --   --   HGB 13.6 12.8 12.2  HCT 37.1 36.0 34.7*  MCV 87.5 88.2 89.0  PLT 161 158 166   Cardiac Enzymes:  Recent Labs Lab 08/17/13 1346 08/17/13 2115 08/18/13 0730  TROPONINI <0.30 <0.30 <0.30   BNP: No components found with this basename:  POCBNP,  CBG: No results found for this basename: GLUCAP,  in the last 168 hours   Micro Results: No results found for this or any previous visit (from the past 240 hour(s)).  Studies/Results: Ct Head Wo Contrast  08/17/2013   CLINICAL DATA:  Syncope  EXAM: CT HEAD WITHOUT CONTRAST  TECHNIQUE: Contiguous axial images were obtained from the base of the skull through the vertex without intravenous contrast. Study was obtained within 24 hr of patient's arrival at the emergency department.  COMPARISON:  June 26, 2008  FINDINGS: There is mild diffuse atrophy. There  is no mass, hemorrhage, extra-axial fluid collection, or midline shift. There is patchy small vessel disease in the centra semiovale bilaterally, stable. There is no acute appearing infarct on this study. There is no new gray-white compartment lesion.  Bony calvarium appears intact. The mastoid air cells are clear.  IMPRESSION: Mild atrophy with stable periventricular small vessel disease. There is no appreciable mass, hemorrhage, or acute appearing infarct.   Electronically Signed   By: Bretta Bang M.D.   On: 08/17/2013 14:41   Dg Chest Port 1 View  08/17/2013   CLINICAL DATA:  Weakness. Nausea and vomiting.  EXAM: PORTABLE CHEST - 1 VIEW  COMPARISON:  CHEST x-ray 08/05/2008.  FINDINGS: Possible trace left pleural effusion. No definite consolidative airspace disease. Mild diffuse peribronchial cuffing. No definite evidence of pulmonary edema. Heart size is mildly enlarged. Upper mediastinal contours are slightly distorted by patient positioning, but are otherwise within normal limits. Atherosclerosis in the thoracic aorta. Status post median sternotomy for CABG and aortic valve replacement (a bioprosthetic aortic valve is noted).  IMPRESSION: 1. Probable trace left pleural effusion. 2. Mild diffuse peribronchial cuffing. This may be chronic in this patient related to chronic bronchitis, or could indicate acute bronchitis. Clinical correlation is recommended. 3. Mild cardiomegaly. 4. Atherosclerosis. 5. Postoperative changes, as above.   Electronically Signed   By: Trudie Reed M.D.   On: 08/17/2013 14:17    Medications: Scheduled Meds: . aspirin  162 mg Oral Daily  . brinzolamide  1 drop Left Eye BID  . fesoterodine  4 mg Oral Daily  . heparin  5,000 Units Subcutaneous Q8H  . latanoprost  1 drop Left Eye QHS  . levothyroxine  50 mcg Oral QAC breakfast  . losartan  100 mg Oral Daily  . potassium chloride  40 mEq Oral Once  . simvastatin  40 mg Oral QPM  . sodium chloride  3 mL Intravenous  Q12H  . triamterene-hydrochlorothiazide  1 tablet Oral Daily      LOS: 1 day   Enza Shone M.D. [facility] 08/18/2013, 11:03 AM Pager: [phone]  If 7PM-7AM, please contact night-coverage [email or web address] Password [email or web address]

## 2013-08-18 NOTE — Consult Note (Signed)
Reason for Consult: Near syncope Referring Physician: TRH   HPI: The patient is a 79 y/o female, followed by Dr. Royann Shivers, who as admitted by [facility] for evaluation of near syncope. Her cardiac history is significant for CAD, s/p CABG in 2009 (LIMA to LAD, SVG to LCX, SVG to L-PLV). S/p AVR in 2009. Also has h/o AAA (3.5 cm via ultrasound in March 2013), HTN, HLD and dementia. She has also had recent issues with bradycardia. They patient's last office visit with Dr. Royann Shivers was 03/27/13. At that time, he felt that this was likely related to a combination of age related sinus node dysfunction, beta blocker effect and anticholinesterase effect. His plan was to have her gradually reduce the dose in half and to reevaluate her at a later date to determine if the medication should be discontinued entirely. Apparently the patient has remained on her BB since that time.  Yesterday, while volunteering at [employer], she noted sudden onset of generalized malaise, nausea and vomiting. She denies feeling lightheaded or dizzy. No chest pain or SOB. She was transported to [facility] for evaluation. When she arrived to the ER , she was bradycardic with a HR in the 30s. BP was stable. She was also hypokalemic with a K+ of 3.0. She was admitted by [facility] and place on telemetry. She was given IV potassium. Her BB was held. Today she is in NSR with a HR in the 60s. K+ is 3.1. She reports feeling much better.    Past Medical History  Diagnosis Date  . Asthma   . Hypertension   . Coronary artery disease     CABG 03/29/08  . Aortic stenosis 03/29/2008    biological prosthetic replacement  . Dyslipidemia   . AAA (abdominal aortic aneurysm) 01/03/13    3.5x3.3cm  . LBBB (left bundle branch block)   . PONV (postoperative nausea and vomiting)   . Hypothyroidism   . Heart murmur   . Alzheimer's dementia     [name] 08/17/2013  . MVP (mitral valve prolapse)     with mild mitral insufficiency/notes 08/17/2013  . Exertional  shortness of breath   . Anemia   . Migraines     "none in years' (08/17/2013)  . Stroke 03/2008    "2 light strokes" (08/17/2013)  . Arthritis     "joints" (08/17/2013)    Past Surgical History  Procedure Laterality Date  . Coronary artery bypass graft  03/29/2008    LIMA TO LAD,SVG TO CX,SVG TO LEFT POSTEROLATERAL BRANCH  . Aortic valve replacement  03/29/2008    PERICARDIAL TISSUE VALVE  . Uterine fibroid surgery  1960's  . Retinal detachment surgery Left 1994    [name] 04/10/2008 (08/17/2013)  . Cataract extraction w/ intraocular lens implant Left   . Cardiac valve replacement    . Abdominal hysterectomy  1977  . Appendectomy  ?1977  . Hip fracture surgery Right 06/2008    "3 metal screws" (08/17/2013)  . Refractive surgery Bilateral     [name] 04/28/2008 (08/17/2013)  . Cardiac catheterization  ~ 2009    Family History  Problem Relation Age of Onset  . Heart failure Mother   . Diabetes Mother   . Hypertension Mother   . Hyperlipidemia Mother     Social History:  reports that she has never smoked. She has never used smokeless tobacco. She reports that she does not drink alcohol or use illicit drugs.  Allergies:  Allergies  Allergen Reactions  . Codeine Nausea And Vomiting  Patient gets violently ill  . Morphine And Related Nausea And Vomiting    Patient get violently ill    Medications:  Prior to Admission medications   Medication Sig Start Date End Date Taking? Authorizing Provider  albuterol (PROVENTIL HFA;VENTOLIN HFA) 108 (90 BASE) MCG/ACT inhaler Inhale 2 puffs into the lungs every 6 (six) hours as needed for wheezing.   Yes Historical Provider, MD  aspirin 81 MG chewable tablet Chew 162 mg by mouth daily.   Yes Historical Provider, MD  brinzolamide (AZOPT) 1 % ophthalmic suspension Place 1 drop into the left eye 2 (two) times daily.   Yes Historical Provider, MD  Calcium Carb-Cholecalciferol (CALCIUM 600 + D PO) Take 1 tablet by mouth daily.   Yes Historical  Provider, MD  fluticasone (FLOVENT HFA) 110 MCG/ACT inhaler Inhale 1 puff into the lungs 2 (two) times daily as needed.   Yes Historical Provider, MD  latanoprost (XALATAN) 0.005 % ophthalmic solution Place 1 drop into the left eye at bedtime.   Yes Historical Provider, MD  levothyroxine (SYNTHROID, LEVOTHROID) 50 MCG tablet Take 50 mcg by mouth daily before breakfast.   Yes Historical Provider, MD  losartan (COZAAR) 100 MG tablet Take 100 mg by mouth daily.   Yes Historical Provider, MD  metoprolol succinate (TOPROL-XL) 50 MG 24 hr tablet Take 1 tablet (50 mg total) by mouth daily. Take with or immediately following a meal. 03/27/13  Yes Mihai Croitoru, MD  potassium chloride SA (K-DUR,KLOR-CON) 20 MEQ tablet Take 20 mEq by mouth daily.   Yes Historical Provider, MD  simvastatin (ZOCOR) 40 MG tablet Take 40 mg by mouth every evening.   Yes Historical Provider, MD  tolterodine (DETROL LA) 2 MG 24 hr capsule Take 2 mg by mouth daily.   Yes Historical Provider, MD  triamterene-hydrochlorothiazide (MAXZIDE-25) 37.5-25 MG per tablet Take 1 tablet by mouth daily.   Yes Historical Provider, MD     Results for orders placed during the hospital encounter of 08/17/13 (from the past 48 hour(s))  CBC WITH DIFFERENTIAL     Status: None   Collection Time    08/17/13  1:46 PM      Result Value Range   WBC 6.1  4.0 - 10.5 K/uL   RBC 4.24  3.87 - 5.11 MIL/uL   Hemoglobin 13.6  12.0 - 15.0 g/dL   HCT 16.1  09.6 - 04.5 %   MCV 87.5  78.0 - 100.0 fL   MCH 31.1  26.0 - 34.0 pg   MCHC 35.3  30.0 - 36.0 g/dL   RDW 40.9  81.1 - 91.4 %   Platelets 161  150 - 400 K/uL   Neutrophils Relative % 73  43 - 77 %   Neutro Abs 4.5  1.7 - 7.7 K/uL   Lymphocytes Relative 18  12 - 46 %   Lymphs Abs 1.1  0.7 - 4.0 K/uL   Monocytes Relative 8  3 - 12 %   Monocytes Absolute 0.5  0.1 - 1.0 K/uL   Eosinophils Relative 1  0 - 5 %   Eosinophils Absolute 0.0  0.0 - 0.7 K/uL   Basophils Relative 0  0 - 1 %   Basophils Absolute  0.0  0.0 - 0.1 K/uL  COMPREHENSIVE METABOLIC PANEL     Status: Abnormal   Collection Time    08/17/13  1:46 PM      Result Value Range   Sodium 135  135 - 145 mEq/L   Potassium  3.0 (*) 3.5 - 5.1 mEq/L   Chloride 96  96 - 112 mEq/L   CO2 27  19 - 32 mEq/L   Glucose, Bld 155 (*) 70 - 99 mg/dL   BUN 19  6 - 23 mg/dL   Creatinine, Ser 1.61 (*) 0.50 - 1.10 mg/dL   Calcium 9.3  8.4 - 09.6 mg/dL   Total Protein 6.8  6.0 - 8.3 g/dL   Albumin 3.9  3.5 - 5.2 g/dL   AST 22  0 - 37 U/L   ALT 14  0 - 35 U/L   Alkaline Phosphatase 63  39 - 117 U/L   Total Bilirubin 0.7  0.3 - 1.2 mg/dL   GFR calc non Af Amer 42 (*) >90 mL/min   GFR calc Af Amer 49 (*) >90 mL/min   Comment: (NOTE)     The eGFR has been calculated using the CKD EPI equation.     This calculation has not been validated in all clinical situations.     eGFR's persistently <90 mL/min signify possible Chronic Kidney     Disease.  TROPONIN I     Status: None   Collection Time    08/17/13  1:46 PM      Result Value Range   Troponin I <0.30  <0.30 ng/mL   Comment:            Due to the release kinetics of cTnI,     a negative result within the first hours     of the onset of symptoms does not rule out     myocardial infarction with certainty.     If myocardial infarction is still suspected,     repeat the test at appropriate intervals.  PRO B NATRIURETIC PEPTIDE     Status: Abnormal   Collection Time    08/17/13  1:46 PM      Result Value Range   Pro B Natriuretic peptide (BNP) 464.4 (*) 0 - 450 pg/mL  LACTIC ACID, PLASMA     Status: None   Collection Time    08/17/13  2:01 PM      Result Value Range   Lactic Acid, Venous 1.7  0.5 - 2.2 mmol/L  URINALYSIS, ROUTINE W REFLEX MICROSCOPIC     Status: None   Collection Time    08/17/13  3:52 PM      Result Value Range   Color, Urine YELLOW  YELLOW   APPearance CLEAR  CLEAR   Specific Gravity, Urine 1.015  1.005 - 1.030   pH 7.5  5.0 - 8.0   Glucose, UA NEGATIVE  NEGATIVE  mg/dL   Hgb urine dipstick NEGATIVE  NEGATIVE   Bilirubin Urine NEGATIVE  NEGATIVE   Ketones, ur NEGATIVE  NEGATIVE mg/dL   Protein, ur NEGATIVE  NEGATIVE mg/dL   Urobilinogen, UA 0.2  0.0 - 1.0 mg/dL   Nitrite NEGATIVE  NEGATIVE   Leukocytes, UA NEGATIVE  NEGATIVE   Comment: MICROSCOPIC NOT DONE ON URINES WITH NEGATIVE PROTEIN, BLOOD, LEUKOCYTES, NITRITE, OR GLUCOSE <1000 mg/dL.  CBC     Status: None   Collection Time    08/17/13  9:15 PM      Result Value Range   WBC 6.5  4.0 - 10.5 K/uL   RBC 4.08  3.87 - 5.11 MIL/uL   Hemoglobin 12.8  12.0 - 15.0 g/dL   HCT 04.5  40.9 - 81.1 %   MCV 88.2  78.0 - 100.0 fL   MCH 31.4  26.0 - 34.0 pg   MCHC 35.6  30.0 - 36.0 g/dL   RDW 40.9  81.1 - 91.4 %   Platelets 158  150 - 400 K/uL  CREATININE, SERUM     Status: Abnormal   Collection Time    08/17/13  9:15 PM      Result Value Range   Creatinine, Ser 1.08  0.50 - 1.10 mg/dL   GFR calc non Af Amer 49 (*) >90 mL/min   GFR calc Af Amer 56 (*) >90 mL/min   Comment: (NOTE)     The eGFR has been calculated using the CKD EPI equation.     This calculation has not been validated in all clinical situations.     eGFR's persistently <90 mL/min signify possible Chronic Kidney     Disease.  TSH     Status: None   Collection Time    08/17/13  9:15 PM      Result Value Range   TSH 1.792  0.350 - 4.500 uIU/mL   Comment: Performed at [facility]  TROPONIN I     Status: None   Collection Time    08/17/13  9:15 PM      Result Value Range   Troponin I <0.30  <0.30 ng/mL   Comment:            Due to the release kinetics of cTnI,     a negative result within the first hours     of the onset of symptoms does not rule out     myocardial infarction with certainty.     If myocardial infarction is still suspected,     repeat the test at appropriate intervals.  PRO B NATRIURETIC PEPTIDE     Status: Abnormal   Collection Time    08/17/13  9:15 PM      Result Value Range   Pro B Natriuretic  peptide (BNP) 456.3 (*) 0 - 450 pg/mL  COMPREHENSIVE METABOLIC PANEL     Status: Abnormal   Collection Time    08/18/13  4:10 AM      Result Value Range   Sodium 135  135 - 145 mEq/L   Potassium 3.1 (*) 3.5 - 5.1 mEq/L   Chloride 99  96 - 112 mEq/L   CO2 26  19 - 32 mEq/L   Glucose, Bld 108 (*) 70 - 99 mg/dL   BUN 16  6 - 23 mg/dL   Creatinine, Ser 7.82 (*) 0.50 - 1.10 mg/dL   Calcium 8.6  8.4 - 95.6 mg/dL   Total Protein 6.0  6.0 - 8.3 g/dL   Albumin 3.4 (*) 3.5 - 5.2 g/dL   AST 18  0 - 37 U/L   ALT 11  0 - 35 U/L   Alkaline Phosphatase 56  39 - 117 U/L   Total Bilirubin 0.7  0.3 - 1.2 mg/dL   GFR calc non Af Amer 46 (*) >90 mL/min   GFR calc Af Amer 53 (*) >90 mL/min   Comment: (NOTE)     The eGFR has been calculated using the CKD EPI equation.     This calculation has not been validated in all clinical situations.     eGFR's persistently <90 mL/min signify possible Chronic Kidney     Disease.  CBC     Status: Abnormal   Collection Time    08/18/13  4:10 AM      Result Value Range   WBC 5.6  4.0 -  10.5 K/uL   RBC 3.90  3.87 - 5.11 MIL/uL   Hemoglobin 12.2  12.0 - 15.0 g/dL   HCT 40.9 (*) 81.1 - 91.4 %   MCV 89.0  78.0 - 100.0 fL   MCH 31.3  26.0 - 34.0 pg   MCHC 35.2  30.0 - 36.0 g/dL   RDW 78.2  95.6 - 21.3 %   Platelets 166  150 - 400 K/uL  TROPONIN I     Status: None   Collection Time    08/18/13  7:30 AM      Result Value Range   Troponin I <0.30  <0.30 ng/mL   Comment:            Due to the release kinetics of cTnI,     a negative result within the first hours     of the onset of symptoms does not rule out     myocardial infarction with certainty.     If myocardial infarction is still suspected,     repeat the test at appropriate intervals.    Ct Head Wo Contrast  08/17/2013   CLINICAL DATA:  Syncope  EXAM: CT HEAD WITHOUT CONTRAST  TECHNIQUE: Contiguous axial images were obtained from the base of the skull through the vertex without intravenous contrast.  Study was obtained within 24 hr of patient's arrival at the emergency department.  COMPARISON:  June 26, 2008  FINDINGS: There is mild diffuse atrophy. There is no mass, hemorrhage, extra-axial fluid collection, or midline shift. There is patchy small vessel disease in the centra semiovale bilaterally, stable. There is no acute appearing infarct on this study. There is no new gray-white compartment lesion.  Bony calvarium appears intact. The mastoid air cells are clear.  IMPRESSION: Mild atrophy with stable periventricular small vessel disease. There is no appreciable mass, hemorrhage, or acute appearing infarct.   Electronically Signed   By: Bretta Bang M.D.   On: 08/17/2013 14:41   Dg Chest Port 1 View  08/17/2013   CLINICAL DATA:  Weakness. Nausea and vomiting.  EXAM: PORTABLE CHEST - 1 VIEW  COMPARISON:  CHEST x-ray 08/05/2008.  FINDINGS: Possible trace left pleural effusion. No definite consolidative airspace disease. Mild diffuse peribronchial cuffing. No definite evidence of pulmonary edema. Heart size is mildly enlarged. Upper mediastinal contours are slightly distorted by patient positioning, but are otherwise within normal limits. Atherosclerosis in the thoracic aorta. Status post median sternotomy for CABG and aortic valve replacement (a bioprosthetic aortic valve is noted).  IMPRESSION: 1. Probable trace left pleural effusion. 2. Mild diffuse peribronchial cuffing. This may be chronic in this patient related to chronic bronchitis, or could indicate acute bronchitis. Clinical correlation is recommended. 3. Mild cardiomegaly. 4. Atherosclerosis. 5. Postoperative changes, as above.   Electronically Signed   By: Trudie Reed M.D.   On: 08/17/2013 14:17    Review of Systems  Constitutional: Positive for malaise/fatigue.  Respiratory: Negative for shortness of breath.   Cardiovascular: Negative for chest pain.  Gastrointestinal: Positive for nausea and vomiting. Negative for diarrhea.    Neurological: Positive for weakness. Negative for dizziness and loss of consciousness.  Psychiatric/Behavioral: Positive for memory loss.  All other systems reviewed and are negative.   Blood pressure 128/70, pulse 84, temperature 98.3 F (36.8 C), temperature source Oral, resp. rate 20, height 5\' 8"  (1.727 m), weight 177 lb 0.5 oz (80.3 kg), SpO2 98.00%. Physical Exam  Constitutional: She is oriented to person, place, and time. She appears well-developed and  well-nourished. No distress.  Neck: No JVD present. Carotid bruit is not present.  Cardiovascular: Normal rate, regular rhythm and intact distal pulses.  Exam reveals no friction rub.   Murmur heard. Pulses:      Radial pulses are 2+ on the right side, and 2+ on the left side.       Dorsalis pedis pulses are 2+ on the right side, and 2+ on the left side.  Respiratory: Effort normal and breath sounds normal. No respiratory distress. She has no wheezes. She has no rales.  GI: Soft. Bowel sounds are normal. She exhibits no distension and no mass. There is no tenderness.  Musculoskeletal: She exhibits no edema.  Neurological: She is alert and oriented to person, place, and time.  Skin: Skin is warm and dry. She is not diaphoretic.  Psychiatric: Her behavior is normal.    Assessment/Plan: Principal Problem:   Near syncope Active Problems:   S/P AVR 2009 - [facility] Magna Ease 21 mm bovine pericardial   CAD (coronary artery disease) s/p CABG - 2009 (LIMA to LAD, SVG to LCX, SVG to L-PLV   HTN (hypertension)   Hyperlipidemia   MVP (mitral valve prolapse) with mild mitral insufficiency   Dementia   Hypothyroidism   Bradycardia   Hypokalemia  Plan: Pt admitted for evaluation of near syncope. H/o CABG x 3 and AVR in 2009. She was bradycardic on admission w/ HR in the 30s. Also hypokalemic w/ K+ of 3.0, which was likely secondary to vomiting.  BB was held. HR improved to the 60s. No further bradycardia on telemetry. BP is stable.  Symptoms resolved.  K+ today is 3.1 despite IV potassium. Mg level was not assessed. I will order and will replete if need. Will also need additional K. It is likely that bradycardia may have been either vagally induced from vomiting, subsequent to hypokalemia or medically induced from BB. Continue to hold BB for now. We can consider OP 30 day event monitor. We will also check 2D echo. If ok, recommend f/u OP stress test. MD to follow.     Allayne Butcher, PA-C 08/18/2013, 10:29 AM      Patient seen and examined. Agree with assessment and plan.  Suspect byradycardia contributed by ongoing beta-blocker use combined with sinus node dysfunction and electrolyte abnormalities induced by vomiting. Echo EF 50 - 55% . Pt is s/p bovine AVR; mean gradient 18 mm peak 28, doubt significant bioprosthetic AS, only minimal thickening and not causative of syncope. Once electrolytes are repleted prob OK to dc home off beta blockade, but need to watch for withdrawal effect  (ideally should be tapered) consider outpatient monitor. Check orthostatics befor dc.  F/U Dr. Jobe Marker, MD, [facility] 08/18/2013 11:51 AM

## 2013-08-18 NOTE — Progress Notes (Signed)
  Echocardiogram 2D Echocardiogram has been performed.  Jorje Guild 08/18/2013, 9:36 AM

## 2013-08-18 NOTE — Progress Notes (Signed)
VASCULAR LAB PRELIMINARY  PRELIMINARY  PRELIMINARY  PRELIMINARY  Carotid Dopplers completed.    Preliminary report:  1-39% ICA stenosis.  Vertebral artery flow is antegrade.    Dardan Shelton, RVT 08/18/2013, 11:45 AM

## 2013-08-18 NOTE — Progress Notes (Signed)
Utilization Review Completed.  

## 2013-08-18 NOTE — Progress Notes (Signed)
Order for swallow evaluation received, upon review of chart noted pt passed RN stroke swallow screen.  MD if desire swallow evaluation or speech evaluation, please reorder.  Thanks. Donavan Burnet, MS [facility] SLP [phone]

## 2013-08-18 NOTE — Discharge Summary (Signed)
Physician Discharge Summary  Patient ID: [name] MRN: [id number] DOB/AGE: July 22, 1934 79 y.o.  Admit date: 08/17/2013 Discharge date: 08/18/2013  Primary Care Physician:  Carolyne Fiscal, MD  Final ischarge Diagnoses:   Near-syncope likely due to bradycardia, vasovagal     Hypokalemia  . CAD (coronary artery disease) s/p CABG - 2009 (LIMA to LAD, SVG to LCX, SVG to L-PLV . HTN (hypertension) . Hyperlipidemia . MVP (mitral valve prolapse) with mild mitral insufficiency . Dementia . Hypothyroidism . Bradycardia . Near syncope  Consults:  Cardiology, Dr. Tresa Endo   Recommendations for Outpatient Follow-up:  1. patient was recommended to followup with PCP in next 7-10 days and recheck BMET, potassium 2. patient was recommended strongly to stop Toprol-XL  Allergies:   Allergies  Allergen Reactions  . Codeine Nausea And Vomiting    Patient gets violently ill  . Morphine And Related Nausea And Vomiting    Patient get violently ill     Discharge Medications:   Medication List    STOP taking these medications       metoprolol succinate 50 MG 24 hr tablet  Commonly known as:  TOPROL-XL      TAKE these medications       albuterol 108 (90 BASE) MCG/ACT inhaler  Commonly known as:  PROVENTIL HFA;VENTOLIN HFA  Inhale 2 puffs into the lungs every 6 (six) hours as needed for wheezing.     aspirin 81 MG chewable tablet  Chew 162 mg by mouth daily.     brinzolamide 1 % ophthalmic suspension  Commonly known as:  AZOPT  Place 1 drop into the left eye 2 (two) times daily.     CALCIUM 600 + D PO  Take 1 tablet by mouth daily.     fluticasone 110 MCG/ACT inhaler  Commonly known as:  FLOVENT HFA  Inhale 1 puff into the lungs 2 (two) times daily as needed.     latanoprost 0.005 % ophthalmic solution  Commonly known as:  XALATAN  Place 1 drop into the left eye at bedtime.     levothyroxine 50 MCG tablet  Commonly known as:  SYNTHROID, LEVOTHROID  Take 50 mcg by  mouth daily before breakfast.     losartan 100 MG tablet  Commonly known as:  COZAAR  Take 100 mg by mouth daily.     potassium chloride SA 20 MEQ tablet  Commonly known as:  K-DUR,KLOR-CON  Take 2 tablets (40 mEq total) by mouth daily.     simvastatin 40 MG tablet  Commonly known as:  ZOCOR  Take 40 mg by mouth every evening.     tolterodine 2 MG 24 hr capsule  Commonly known as:  DETROL LA  Take 2 mg by mouth daily.     triamterene-hydrochlorothiazide 37.5-25 MG per tablet  Commonly known as:  MAXZIDE-25  Take 1 tablet by mouth daily.         Brief H and P: For complete details please refer to admission H and P, but in brief  [name] is a 79 y.o. female  With a history of coronary disease status post CABG, aortic valve replacement with porcine valve, hypertension, TIA, Alzheimer's dementia that presented to the emergency department for almost passing out on the day of admission and feeling weak. Patient stated she woke up early and went to go volunteer. While she was there patient states that she started to feel not well and sat down. She didn't they felt that she was want  to pass out so she laid herself down before. Patient did not actually lose consciousness. Of note patient is reported that she had not had anything to eat or drink all day. She also states that she had several episodes of vomiting today .   Hospital Course:   Near syncope likely due to bradycardia, vasovagal: Patient was admitted to telemetry floor to rule out acute ACS, she was noted to be somewhat bradycardiac with rate in 40s at the time of admission. Upon reviewing prior note from Dr. Royann Shivers in 6/14, she was recommended to discontinue beta blocker after PCP followup. Patient's bradycardia was thought to be the combination of age-related sinus node dysfunction, beta blocker effect and anticholinesterase intact. Patient was recommended to decrease the Toprol-XL dose to 25 mg daily, then gradually off  however it is noted that she is still on Toprol XL 50 mg dose. 2-D echo was done which showed EF of 50-55%, status post bovine aVR, mean gradient 18 MM, peak28, doubt significant bioprosthetic AS - 2-D echo pending, to assess the prostatic valve gradient and no signs of any dysfunction also called cardiology consultation, discussed with Dr. Tresa Endo  Sinus bradycardia Possibly secondary to metoprolol. TSH is 1.79   Hypertension continue home medications triamterene, hydrochlorothiazide, Losartan.  Hyperlipidemia continue simvastatin.   Hypothyroidism continue Synthroid, TSH 1.7  Hypokalemia replaced likely due to her hypertension medications.    Day of Discharge BP 128/70  Pulse 84  Temp(Src) 98.3 F (36.8 C) (Oral)  Resp 20  Ht 5\' 8"  (1.727 m)  Wt 80.3 kg (177 lb 0.5 oz)  BMI 26.92 kg/m2  SpO2 98%  Physical Exam: General: Alert and awake oriented x3 not in any acute distress. HEENT: anicteric sclera, pupils reactive to light and accommodation CVS: S1-S2 clear no murmur rubs or gallops Chest: clear to auscultation bilaterally, no wheezing rales or rhonchi Abdomen: soft nontender, nondistended, normal bowel sounds, no organomegaly Extremities: no cyanosis, clubbing or edema noted bilaterally Neuro: Cranial nerves II-XII intact, no focal neurological deficits   The results of significant diagnostics from this hospitalization (including imaging, microbiology, ancillary and laboratory) are listed below for reference.    LAB RESULTS: Basic Metabolic Panel:  Recent Labs Lab 08/17/13 1346 08/17/13 2115 08/18/13 0410  NA 135  --  135  K 3.0*  --  3.1*  CL 96  --  99  CO2 27  --  26  GLUCOSE 155*  --  108*  BUN 19  --  16  CREATININE 1.22* 1.08 1.13*  CALCIUM 9.3  --  8.6   Liver Function Tests:  Recent Labs Lab 08/17/13 1346 08/18/13 0410  AST 22 18  ALT 14 11  ALKPHOS 63 56  BILITOT 0.7 0.7  PROT 6.8 6.0  ALBUMIN 3.9 3.4*   No results found for this basename:  LIPASE, AMYLASE,  in the last 168 hours No results found for this basename: AMMONIA,  in the last 168 hours CBC:  Recent Labs Lab 08/17/13 1346 08/17/13 2115 08/18/13 0410  WBC 6.1 6.5 5.6  NEUTROABS 4.5  --   --   HGB 13.6 12.8 12.2  HCT 37.1 36.0 34.7*  MCV 87.5 88.2 89.0  PLT 161 158 166   Cardiac Enzymes:  Recent Labs Lab 08/17/13 2115 08/18/13 0730  TROPONINI <0.30 <0.30   BNP: No components found with this basename: POCBNP,  CBG: No results found for this basename: GLUCAP,  in the last 168 hours  Significant Diagnostic Studies:  Ct Head Wo  Contrast  08/17/2013   CLINICAL DATA:  Syncope  EXAM: CT HEAD WITHOUT CONTRAST  TECHNIQUE: Contiguous axial images were obtained from the base of the skull through the vertex without intravenous contrast. Study was obtained within 24 hr of patient's arrival at the emergency department.  COMPARISON:  June 26, 2008  FINDINGS: There is mild diffuse atrophy. There is no mass, hemorrhage, extra-axial fluid collection, or midline shift. There is patchy small vessel disease in the centra semiovale bilaterally, stable. There is no acute appearing infarct on this study. There is no new gray-white compartment lesion.  Bony calvarium appears intact. The mastoid air cells are clear.  IMPRESSION: Mild atrophy with stable periventricular small vessel disease. There is no appreciable mass, hemorrhage, or acute appearing infarct.   Electronically Signed   By: Bretta Bang M.D.   On: 08/17/2013 14:41   Dg Chest Port 1 View  08/17/2013   CLINICAL DATA:  Weakness. Nausea and vomiting.  EXAM: PORTABLE CHEST - 1 VIEW  COMPARISON:  CHEST x-ray 08/05/2008.  FINDINGS: Possible trace left pleural effusion. No definite consolidative airspace disease. Mild diffuse peribronchial cuffing. No definite evidence of pulmonary edema. Heart size is mildly enlarged. Upper mediastinal contours are slightly distorted by patient positioning, but are otherwise within  normal limits. Atherosclerosis in the thoracic aorta. Status post median sternotomy for CABG and aortic valve replacement (a bioprosthetic aortic valve is noted).  IMPRESSION: 1. Probable trace left pleural effusion. 2. Mild diffuse peribronchial cuffing. This may be chronic in this patient related to chronic bronchitis, or could indicate acute bronchitis. Clinical correlation is recommended. 3. Mild cardiomegaly. 4. Atherosclerosis. 5. Postoperative changes, as above.   Electronically Signed   By: Trudie Reed M.D.   On: 08/17/2013 14:17    2D ECHO: Study Conclusions  - Left ventricle: The cavity size was normal. There was mild concentric hypertrophy. Systolic function was normal. The estimated ejection fraction was in the range of 50% to 55%. Regional wall motion abnormality cannot be excluded in the basal posterolateral wall. Features are consistent with a pseudonormal left ventricular filling pattern, with concomitant abnormal relaxation and increased filling pressure (grade 2 diastolic dysfunction). Doppler parameters are consistent with elevated mean left atrial filling pressure. - Aortic valve: Mildly calcified annulus. Mildly thickened leaflets. A bovine bioprosthesis was present. The sewing ring appeared normal. There was very mild stenosis.    Disposition and Follow-up:    DISPOSITION: Home  DIET: Heart healthy diet  ACTIVITY: As tolerated   DISCHARGE FOLLOW-UP     Follow-up Information   Follow up with CROITORU,MIHAI, MD. Schedule an appointment as soon as possible for a visit in 2 weeks.   Specialty:  Cardiology   Contact information:   [address] [place] [address] [phone]       Follow up with Carolyne Fiscal, MD. Schedule an appointment as soon as possible for a visit in 10 days. (for hospital follow-up. Please check BMET for renal function and potassium level)    Specialty:  Family Medicine   Contact information:   [address] [place] [address] [phone]       Time spent on Discharge: 45 minutes Signed:   Khamille Beynon M.D. [facility] 08/18/2013, 12:14 PM Pager: [phone]

## 2013-08-18 NOTE — Evaluation (Signed)
Physical Therapy Evaluation Patient Details Name: [name] MRN: [id number] DOB: 02/04/1934 Today's Date: 08/18/2013 Time: 0828-0901 PT Time Calculation (min): 33 min  PT Assessment / Plan / Recommendation History of Present Illness  Patient is a 79 yo female with a history of coronary disease status post CABG, aortic valve replacement with porcine valve, hypertension, TIA, Alzheimer's dementia that presents to the emergency department for almost passing out today and feeling weak.   Clinical Impression  Patient is independent with mobility and gait.  Cognition is decreased - most likely baseline.  No acute PT needs - PT will sign off.    PT Assessment  Patent does not need any further PT services    Follow Up Recommendations  No PT follow up;Supervision - Intermittent    Does the patient have the potential to tolerate intense rehabilitation      Barriers to Discharge        Equipment Recommendations  None recommended by PT    Recommendations for Other Services     Frequency      Precautions / Restrictions Precautions Precautions: None Restrictions Weight Bearing Restrictions: No   Pertinent Vitals/Pain       Mobility  Bed Mobility Bed Mobility: Sit to Supine Sit to Supine: 7: Independent;HOB flat Transfers Transfers: Sit to Stand;Stand to Sit Sit to Stand: 5: Supervision;From bed Stand to Sit: 7: Independent;To bed Details for Transfer Assistance: Supervision for safety with IV and monitor cords.  Patient stood and started to ambulate before IV unplugged. Ambulation/Gait Ambulation/Gait Assistance: 7: Independent Ambulation Distance (Feet): 200 Feet Assistive device: None Ambulation/Gait Assistance Details: Patient with good gait pattern, balance, and speed. Gait Pattern: Within Functional Limits Gait velocity: WFL Modified Rankin (Stroke Patients Only) Pre-Morbid Rankin Score: Slight disability (Due to cognition) Modified Rankin: Slight disability       PT Goals(Current goals can be found in the care plan section)  N/A  Visit Information  Last PT Received On: 08/18/13 Assistance Needed: +1 History of Present Illness: Patient is a 79 yo female with a history of coronary disease status post CABG, aortic valve replacement with porcine valve, hypertension, TIA, Alzheimer's dementia that presents to the emergency department for almost passing out today and feeling weak.        Prior Functioning  Home Living Family/patient expects to be discharged to:: Private residence Living Arrangements: Children (Daughter and son-in-law) Available Help at Discharge: Family;Available PRN/intermittently (Family works) Type of Home: House Home Access: Stairs to enter Secretary/administrator of Steps: 3 Entrance Stairs-Rails: Right Home Layout: One level Home Equipment: None Prior Function Level of Independence: Independent;Needs assistance Comments: Assist for safety due to cognition.  Does not drive Communication Communication: No difficulties    Cognition  Cognition Arousal/Alertness: Awake/alert Behavior During Therapy: Impulsive Overall Cognitive Status: No family/caregiver present to determine baseline cognitive functioning Area of Impairment: Attention;Safety/judgement;Problem solving Current Attention Level: Sustained Memory: Decreased short-term memory Safety/Judgement: Decreased awareness of safety Problem Solving: Difficulty sequencing;Requires verbal cues General Comments: Patient repeats self and contradicts self during conversation.  Needs redirection to task.    Extremity/Trunk Assessment Upper Extremity Assessment Upper Extremity Assessment: Overall WFL for tasks assessed Lower Extremity Assessment Lower Extremity Assessment: Overall WFL for tasks assessed Cervical / Trunk Assessment Cervical / Trunk Assessment: Normal   Balance Balance Balance Assessed: Yes High Level Balance High Level Balance Activites: Turns;Sudden  stops;Head turns High Level Balance Comments: No loss of balance with high level activities  End of Session PT - End of  Session Activity Tolerance: Patient tolerated treatment well Patient left: in bed;with call bell/phone within reach Nurse Communication: Mobility status  GP Functional Assessment Tool Used: Clinical judgement Functional Limitation: Mobility: Walking and moving around Mobility: Walking and Moving Around Current Status ([id number]): At least 1 percent but less than 20 percent impaired, limited or restricted Mobility: Walking and Moving Around Goal Status [phone]): At least 1 percent but less than 20 percent impaired, limited or restricted Mobility: Walking and Moving Around Discharge Status [phone]): At least 1 percent but less than 20 percent impaired, limited or restricted   Vena Austria 08/18/2013, 10:12 AM Durenda Hurt. Renaldo Fiddler, [facility] Pager [phone]

## 2013-09-07 ENCOUNTER — Ambulatory Visit (INDEPENDENT_AMBULATORY_CARE_PROVIDER_SITE_OTHER): Payer: Medicare Other | Admitting: Cardiovascular Disease

## 2013-09-07 ENCOUNTER — Encounter: Payer: Self-pay | Admitting: Cardiovascular Disease

## 2013-09-07 VITALS — BP 163/66 | HR 62 | Ht 68.5 in | Wt 176.6 lb

## 2013-09-07 DIAGNOSIS — R55 Syncope and collapse: Secondary | ICD-10-CM

## 2013-09-07 DIAGNOSIS — I10 Essential (primary) hypertension: Secondary | ICD-10-CM

## 2013-09-07 DIAGNOSIS — Z952 Presence of prosthetic heart valve: Secondary | ICD-10-CM

## 2013-09-07 DIAGNOSIS — Z954 Presence of other heart-valve replacement: Secondary | ICD-10-CM

## 2013-09-07 DIAGNOSIS — I2510 Atherosclerotic heart disease of native coronary artery without angina pectoris: Secondary | ICD-10-CM

## 2013-09-07 NOTE — Patient Instructions (Addendum)
Your physician has recommended that you have a Medtronic Linq inserted. A Linq is a small device that is placed under the skin of your chest to record abnormal heart rhythms. . Pacemakers are used to treat heart rhythms that are too slow.  This is done in the hospital and does not require an overnight stay.   Your physician recommends that you schedule a follow-up appointment after the procedure.

## 2013-09-09 NOTE — Progress Notes (Signed)
Patient ID: [name], female   DOB: 01/15/1934, 79 y.o.   MRN: [id number]     Reason for office visit Syncope  [name] appearance to a full blown syncope during a church function. She was helping at one of the stents but felt ill and went to another room. She sat down in a chair but felt that she was going to lose consciousness so she slid down to the floor. The next thing she remembers the paramedics were assisting her. She does not remember losing consciousness but this was confirmed by other people in the room. When she arrived in the emergency room show somewhat bradycardic with a heart rate in the 40s it was felt by the emergency room physician that the events probably represents a vasovagal syncope. No clear trigger was noted. 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 No new events have occurred.  She has a long-standing history of cardiac illness and previously underwent aortic valve replacement with biological prosthesis and multivessel coronary bypass surgery.   Allergies  Allergen Reactions  . Codeine Nausea And Vomiting    Patient gets violently ill  . Morphine And Related Nausea And Vomiting    Patient get violently ill    Current Outpatient Prescriptions  Medication Sig Dispense Refill  . albuterol (PROVENTIL HFA;VENTOLIN HFA) 108 (90 BASE) MCG/ACT inhaler Inhale 2 puffs into the lungs every 6 (six) hours as needed for wheezing.      . aspirin 81 MG chewable tablet Chew 162 mg by mouth daily.      . brinzolamide (AZOPT) 1 % ophthalmic suspension Place 1 drop into the left eye 2 (two) times daily.      . Calcium Carb-Cholecalciferol (CALCIUM 600 + D PO) Take 1 tablet by mouth daily.      . fluticasone (FLOVENT HFA) 110 MCG/ACT inhaler Inhale 1 puff into the lungs 2  (two) times daily as needed.      . latanoprost (XALATAN) 0.005 % ophthalmic solution Place 1 drop into the left eye at bedtime.      . levothyroxine (SYNTHROID, LEVOTHROID) 50 MCG tablet Take 50 mcg by mouth daily before breakfast.      . losartan (COZAAR) 100 MG tablet Take 100 mg by mouth daily.      . potassium chloride SA (K-DUR,KLOR-CON) 20 MEQ tablet Take 2 tablets (40 mEq total) by mouth daily.  60 tablet  1  . simvastatin (ZOCOR) 40 MG tablet Take 40 mg by mouth every evening.      . tolterodine (DETROL LA) 2 MG 24 hr capsule Take 2 mg by mouth daily.      . triamterene-hydrochlorothiazide (MAXZIDE-25) 37.5-25 MG per tablet Take 1 tablet by mouth daily.       No current facility-administered medications for this visit.    Past Medical History  Diagnosis Date  . Asthma   . Hypertension   . Coronary artery disease     CABG 03/29/08  . Aortic stenosis 03/29/2008    biological prosthetic replacement  . Dyslipidemia   . AAA (abdominal aortic aneurysm) 01/03/13    3.5x3.3cm  . LBBB (left bundle branch block)   . PONV (postoperative nausea and vomiting)   . Hypothyroidism   . Heart murmur   . Alzheimer's dementia     /notes 08/17/2013  .   MVP (mitral valve prolapse)     with mild mitral insufficiency/notes 08/17/2013  . Exertional shortness of breath   . Anemia   . Migraines     "none in years' (08/17/2013)  . Stroke 03/2008    "2 light strokes" (08/17/2013)  . Arthritis     "joints" (08/17/2013)    Past Surgical History  Procedure Laterality Date  . Coronary artery bypass graft  03/29/2008    LIMA TO LAD,SVG TO CX,SVG TO LEFT POSTEROLATERAL BRANCH  . Aortic valve replacement  03/29/2008    PERICARDIAL TISSUE VALVE  . Uterine fibroid surgery  1960's  . Retinal detachment surgery Left 1994    /notes 04/10/2008 (08/17/2013)  . Cataract extraction w/ intraocular lens implant Left   . Cardiac valve replacement    . Abdominal hysterectomy  1977  . Appendectomy  ?1977  . Hip  fracture surgery Right 06/2008    "3 metal screws" (08/17/2013)  . Refractive surgery Bilateral     /notes 04/28/2008 (08/17/2013)  . Cardiac catheterization  ~ 2009    Family History  Problem Relation Age of Onset  . Heart failure Mother   . Diabetes Mother   . Hypertension Mother   . Hyperlipidemia Mother     History   Social History  . Marital Status: Divorced    Spouse Name: N/A    Number of Children: N/A  . Years of Education: N/A   Occupational History  . Not on file.   Social History Main Topics  . Smoking status: Never Smoker   . Smokeless tobacco: Never Used  . Alcohol Use: No  . Drug Use: No  . Sexual Activity: No   Other Topics Concern  . Not on file   Social History Narrative  . No narrative on file    Review of systems: The patient specifically denies any chest pain at rest or with exertion, dyspnea at rest or with exertion, orthopnea, paroxysmal nocturnal dyspnea, palpitations, focal neurological deficits, intermittent claudication, lower extremity edema, unexplained weight gain, cough, hemoptysis or wheezing.  The patient also denies abdominal pain, nausea, vomiting, dysphagia, diarrhea, constipation, polyuria, polydipsia, dysuria, hematuria, frequency, urgency, abnormal bleeding or bruising, fever, chills, unexpected weight changes, mood swings, change in skin or hair texture, change in voice quality, auditory or visual problems, allergic reactions or rashes, new musculoskeletal complaints other than usual "aches and pains".   PHYSICAL EXAM BP 163/66  Pulse 62  Ht 5' 8.5" (1.74 m)  Wt 176 lb 9.6 oz (80.105 kg)  BMI 26.46 kg/m2  General: Alert, oriented x3, no distress Head: no evidence of trauma, PERRL, EOMI, no exophtalmos or lid lag, no myxedema, no xanthelasma; normal ears, nose and oropharynx Neck: normal jugular venous pulsations and no hepatojugular reflux; brisk carotid pulses without delay and no carotid bruits Chest: clear to auscultation,  no signs of consolidation by percussion or palpation, normal fremitus, symmetrical and full respiratory excursions Cardiovascular: normal position and quality of the apical impulse, regular rhythm, normal first and second heart sounds, no murmurs, rubs or gallops Abdomen: no tenderness or distention, no masses by palpation, no abnormal pulsatility or arterial bruits, normal bowel sounds, no hepatosplenomegaly Extremities: no clubbing, cyanosis or edema; 2+ radial, ulnar and brachial pulses bilaterally; 2+ right femoral, posterior tibial and dorsalis pedis pulses; 2+ left femoral, posterior tibial and dorsalis pedis pulses; no subclavian or femoral bruits Neurological: grossly nonfocal   EKG: NSR, trifascicular block, RBBB+LAFB  Lipid Panel     Component Value Date/Time     CHOL  Value: 94        ATP III CLASSIFICATION:  <200     mg/dL   Desirable  200-239  mg/dL   Borderline High  >=240    mg/dL   High 04/10/2008 0500   TRIG 77 04/10/2008 0500   HDL 26* 04/10/2008 0500   CHOLHDL 3.6 04/10/2008 0500   VLDL 15 04/10/2008 0500   LDLCALC  Value: 53        Total Cholesterol/HDL:CHD Risk Coronary Heart Disease Risk Table                     Men   Women  1/2 Average Risk   3.4   3.3 04/10/2008 0500    BMET    Component Value Date/Time   NA 135 08/18/2013 0410   K 3.8 08/18/2013 1250   CL 99 08/18/2013 0410   CO2 26 08/18/2013 0410   GLUCOSE 108* 08/18/2013 0410   BUN 16 08/18/2013 0410   CREATININE 1.13* 08/18/2013 0410   CALCIUM 8.6 08/18/2013 0410   GFRNONAA 46* 08/18/2013 0410   GFRAA 53* 08/18/2013 0410     ASSESSMENT AND PLAN  Although she may indeed have had a vagal event, I have high level of suspicion that she had a dense heart block and may need permanent pacemaker therapy. Have recommended that she have an implantable loop recorder. The risks and benefits of this device were discussed in detail and she understands. She wishes to proceed. Beta blocker therapy has been  discontinued.  Orders Placed This Encounter  Procedures  . LOOP RECORDER IMPLANT   Patient Instructions  Your physician has recommended that you have a Medtronic Linq inserted. A Linq is a small device that is placed under the skin of your chest to record abnormal heart rhythms. . Pacemakers are used to treat heart rhythms that are too slow.  This is done in the hospital and does not require an overnight stay.   Your physician recommends that you schedule a follow-up appointment after the procedure.         Lerin Jech  Geneive Sandstrom, MD, FACC [facility] [phone] office [phone] pager   

## 2013-09-10 ENCOUNTER — Encounter (HOSPITAL_COMMUNITY): Payer: Self-pay | Admitting: Pharmacy Technician

## 2013-09-12 ENCOUNTER — Encounter: Payer: Self-pay | Admitting: Cardiovascular Disease

## 2013-09-17 ENCOUNTER — Encounter (HOSPITAL_COMMUNITY)
Admission: RE | Disposition: A | Discharge status: Home / Self Care | Payer: Self-pay | Source: Ambulatory Visit | Attending: Cardiovascular Disease

## 2013-09-17 ENCOUNTER — Ambulatory Visit (HOSPITAL_COMMUNITY)
Admission: RE | Admit: 2013-09-17 | Discharge: 2013-09-17 | Disposition: A | Discharge status: Home / Self Care | Payer: Medicare Other | Source: Ambulatory Visit | Attending: Cardiovascular Disease | Admitting: Cardiovascular Disease

## 2013-09-17 DIAGNOSIS — I498 Other specified cardiac arrhythmias: Secondary | ICD-10-CM | POA: Insufficient documentation

## 2013-09-17 DIAGNOSIS — Z8673 Personal history of transient ischemic attack (TIA), and cerebral infarction without residual deficits: Secondary | ICD-10-CM | POA: Insufficient documentation

## 2013-09-17 DIAGNOSIS — F0280 Dementia in other diseases classified elsewhere without behavioral disturbance: Secondary | ICD-10-CM | POA: Insufficient documentation

## 2013-09-17 DIAGNOSIS — E785 Hyperlipidemia, unspecified: Secondary | ICD-10-CM | POA: Insufficient documentation

## 2013-09-17 DIAGNOSIS — Z79899 Other long term (current) drug therapy: Secondary | ICD-10-CM | POA: Insufficient documentation

## 2013-09-17 DIAGNOSIS — I10 Essential (primary) hypertension: Secondary | ICD-10-CM | POA: Insufficient documentation

## 2013-09-17 DIAGNOSIS — Z7982 Long term (current) use of aspirin: Secondary | ICD-10-CM | POA: Insufficient documentation

## 2013-09-17 DIAGNOSIS — E039 Hypothyroidism, unspecified: Secondary | ICD-10-CM | POA: Insufficient documentation

## 2013-09-17 DIAGNOSIS — R55 Syncope and collapse: Secondary | ICD-10-CM | POA: Insufficient documentation

## 2013-09-17 DIAGNOSIS — Z951 Presence of aortocoronary bypass graft: Secondary | ICD-10-CM | POA: Insufficient documentation

## 2013-09-17 DIAGNOSIS — Z954 Presence of other heart-valve replacement: Secondary | ICD-10-CM | POA: Insufficient documentation

## 2013-09-17 DIAGNOSIS — I2510 Atherosclerotic heart disease of native coronary artery without angina pectoris: Secondary | ICD-10-CM | POA: Insufficient documentation

## 2013-09-17 DIAGNOSIS — F0390 Unspecified dementia without behavioral disturbance: Secondary | ICD-10-CM | POA: Insufficient documentation

## 2013-09-17 DIAGNOSIS — G309 Alzheimer's disease, unspecified: Secondary | ICD-10-CM | POA: Insufficient documentation

## 2013-09-17 DIAGNOSIS — J45909 Unspecified asthma, uncomplicated: Secondary | ICD-10-CM | POA: Insufficient documentation

## 2013-09-17 HISTORY — PX: LOOP RECORDER IMPLANT: SHX5477

## 2013-09-17 SURGERY — LOOP RECORDER IMPLANT
Anesthesia: LOCAL

## 2013-09-17 MED ORDER — LIDOCAINE HCL (PF) 1 % IJ SOLN
INTRAMUSCULAR | Status: AC
  Filled 2013-09-17: qty 30

## 2013-09-17 MED ORDER — LIDOCAINE-EPINEPHRINE 1 %-1:100000 IJ SOLN
INTRAMUSCULAR | Status: AC
  Filled 2013-09-17: qty 1

## 2013-09-17 NOTE — H&P (View-Only) (Signed)
Patient ID: [name], female   DOB: 05/30/1934, 79 y.o.   MRN: [id number]     Reason for office visit Syncope  Mrs. Salts appearance to a full blown syncope during a church function. She was helping at one of the stents but felt ill and went to another room. She sat down in a chair but felt that she was going to lose consciousness so she slid down to the floor. The next thing she remembers the paramedics were assisting her. She does not remember losing consciousness but this was confirmed by other people in the room. When she arrived in the emergency room show somewhat bradycardic with a heart rate in the 40s it was felt by the emergency room physician that the events probably represents a vasovagal syncope. No clear trigger was noted. At my last appointment with [name] I had recommended that she discontinue her beta blocker because she had profound bradycardia as well as trifascicular block (right bundle branch block, left anterior fascicular block and long PR interval). No new events have occurred.  She has a long-standing history of cardiac illness and previously underwent aortic valve replacement with biological prosthesis and multivessel coronary bypass surgery.   Allergies  Allergen Reactions  . Codeine Nausea And Vomiting    Patient gets violently ill  . Morphine And Related Nausea And Vomiting    Patient get violently ill    Current Outpatient Prescriptions  Medication Sig Dispense Refill  . albuterol (PROVENTIL HFA;VENTOLIN HFA) 108 (90 BASE) MCG/ACT inhaler Inhale 2 puffs into the lungs every 6 (six) hours as needed for wheezing.      Marland Kitchen aspirin 81 MG chewable tablet Chew 162 mg by mouth daily.      . brinzolamide (AZOPT) 1 % ophthalmic suspension Place 1 drop into the left eye 2 (two) times daily.      . Calcium Carb-Cholecalciferol (CALCIUM 600 + D PO) Take 1 tablet by mouth daily.      . fluticasone (FLOVENT HFA) 110 MCG/ACT inhaler Inhale 1 puff into the lungs 2  (two) times daily as needed.      . latanoprost (XALATAN) 0.005 % ophthalmic solution Place 1 drop into the left eye at bedtime.      Marland Kitchen levothyroxine (SYNTHROID, LEVOTHROID) 50 MCG tablet Take 50 mcg by mouth daily before breakfast.      . losartan (COZAAR) 100 MG tablet Take 100 mg by mouth daily.      . potassium chloride SA (K-DUR,KLOR-CON) 20 MEQ tablet Take 2 tablets (40 mEq total) by mouth daily.  60 tablet  1  . simvastatin (ZOCOR) 40 MG tablet Take 40 mg by mouth every evening.      . tolterodine (DETROL LA) 2 MG 24 hr capsule Take 2 mg by mouth daily.      Marland Kitchen triamterene-hydrochlorothiazide (MAXZIDE-25) 37.5-25 MG per tablet Take 1 tablet by mouth daily.       No current facility-administered medications for this visit.    Past Medical History  Diagnosis Date  . Asthma   . Hypertension   . Coronary artery disease     CABG 03/29/08  . Aortic stenosis 03/29/2008    biological prosthetic replacement  . Dyslipidemia   . AAA (abdominal aortic aneurysm) 01/03/13    3.5x3.3cm  . LBBB (left bundle branch block)   . PONV (postoperative nausea and vomiting)   . Hypothyroidism   . Heart murmur   . Alzheimer's dementia     [name] 08/17/2013  .  MVP (mitral valve prolapse)     with mild mitral insufficiency/notes 08/17/2013  . Exertional shortness of breath   . Anemia   . Migraines     "none in years' (08/17/2013)  . Stroke 03/2008    "2 light strokes" (08/17/2013)  . Arthritis     "joints" (08/17/2013)    Past Surgical History  Procedure Laterality Date  . Coronary artery bypass graft  03/29/2008    LIMA TO LAD,SVG TO CX,SVG TO LEFT POSTEROLATERAL BRANCH  . Aortic valve replacement  03/29/2008    PERICARDIAL TISSUE VALVE  . Uterine fibroid surgery  1960's  . Retinal detachment surgery Left 1994    [name] 04/10/2008 (08/17/2013)  . Cataract extraction w/ intraocular lens implant Left   . Cardiac valve replacement    . Abdominal hysterectomy  1977  . Appendectomy  ?1977  . Hip  fracture surgery Right 06/2008    "3 metal screws" (08/17/2013)  . Refractive surgery Bilateral     [name] 04/28/2008 (08/17/2013)  . Cardiac catheterization  ~ 2009    Family History  Problem Relation Age of Onset  . Heart failure Mother   . Diabetes Mother   . Hypertension Mother   . Hyperlipidemia Mother     History   Social History  . Marital Status: Divorced    Spouse Name: N/A    Number of Children: N/A  . Years of Education: N/A   Occupational History  . Not on file.   Social History Main Topics  . Smoking status: Never Smoker   . Smokeless tobacco: Never Used  . Alcohol Use: No  . Drug Use: No  . Sexual Activity: No   Other Topics Concern  . Not on file   Social History Narrative  . No narrative on file    Review of systems: The patient specifically denies any chest pain at rest or with exertion, dyspnea at rest or with exertion, orthopnea, paroxysmal nocturnal dyspnea, palpitations, focal neurological deficits, intermittent claudication, lower extremity edema, unexplained weight gain, cough, hemoptysis or wheezing.  The patient also denies abdominal pain, nausea, vomiting, dysphagia, diarrhea, constipation, polyuria, polydipsia, dysuria, hematuria, frequency, urgency, abnormal bleeding or bruising, fever, chills, unexpected weight changes, mood swings, change in skin or hair texture, change in voice quality, auditory or visual problems, allergic reactions or rashes, new musculoskeletal complaints other than usual "aches and pains".   PHYSICAL EXAM BP 163/66  Pulse 62  Ht 5' 8.5" (1.74 m)  Wt 176 lb 9.6 oz (80.105 kg)  BMI 26.46 kg/m2  General: Alert, oriented x3, no distress Head: no evidence of trauma, PERRL, EOMI, no exophtalmos or lid lag, no myxedema, no xanthelasma; normal ears, nose and oropharynx Neck: normal jugular venous pulsations and no hepatojugular reflux; brisk carotid pulses without delay and no carotid bruits Chest: clear to auscultation,  no signs of consolidation by percussion or palpation, normal fremitus, symmetrical and full respiratory excursions Cardiovascular: normal position and quality of the apical impulse, regular rhythm, normal first and second heart sounds, no murmurs, rubs or gallops Abdomen: no tenderness or distention, no masses by palpation, no abnormal pulsatility or arterial bruits, normal bowel sounds, no hepatosplenomegaly Extremities: no clubbing, cyanosis or edema; 2+ radial, ulnar and brachial pulses bilaterally; 2+ right femoral, posterior tibial and dorsalis pedis pulses; 2+ left femoral, posterior tibial and dorsalis pedis pulses; no subclavian or femoral bruits Neurological: grossly nonfocal   EKG: NSR, trifascicular block, RBBB+LAFB  Lipid Panel     Component Value Date/Time  CHOL  Value: 94        ATP III CLASSIFICATION:  <200     mg/dL   Desirable  161-096  mg/dL   Borderline High  >=045    mg/dL   High 01/31/8118 1478   TRIG 77 04/10/2008 0500   HDL 26* 04/10/2008 0500   CHOLHDL 3.6 04/10/2008 0500   VLDL 15 04/10/2008 0500   LDLCALC  Value: 53        Total Cholesterol/HDL:CHD Risk Coronary Heart Disease Risk Table                     Men   Women  1/2 Average Risk   3.4   3.3 04/10/2008 0500    BMET    Component Value Date/Time   NA 135 08/18/2013 0410   K 3.8 08/18/2013 1250   CL 99 08/18/2013 0410   CO2 26 08/18/2013 0410   GLUCOSE 108* 08/18/2013 0410   BUN 16 08/18/2013 0410   CREATININE 1.13* 08/18/2013 0410   CALCIUM 8.6 08/18/2013 0410   GFRNONAA 46* 08/18/2013 0410   GFRAA 53* 08/18/2013 0410     ASSESSMENT AND PLAN  Although she may indeed have had a vagal event, I have high level of suspicion that she had a dense heart block and may need permanent pacemaker therapy. Have recommended that she have an implantable loop recorder. The risks and benefits of this device were discussed in detail and she understands. She wishes to proceed. Beta blocker therapy has been  discontinued.  Orders Placed This Encounter  Procedures  . LOOP RECORDER IMPLANT   Patient Instructions  Your physician has recommended that you have a Medtronic Linq inserted. A Linq is a small device that is placed under the skin of your chest to record abnormal heart rhythms. . Pacemakers are used to treat heart rhythms that are too slow.  This is done in the hospital and does not require an overnight stay.   Your physician recommends that you schedule a follow-up appointment after the procedure.         Junious Silk, MD, [facility] [phone] office [phone] pager

## 2013-09-17 NOTE — CV Procedure (Signed)
   LOOP RECORDER IMPLANT   Procedure report  Procedure performed:  1. Loop recorder implantation  Reason for procedure:  1. Recurrent syncope/near-syncope; palpitations  Procedure performed by:  [name], MD  Complications:  None  Estimated blood loss:  <5 mL  Medications administered during procedure:  Lidocaine 1% 10 mL locally Device details:  Medtronic Reveal Linq model number [id number], serial number [id number] Procedure details:  After the risks and benefits of the procedure were discussed the patient provided informed consent. He was brought to the short stay area in the fasting state. The patient was prepped and draped in usual sterile fashion. Local anesthesia with 1% lidocaine was administered to an area 2 cm to the left of the sternum in the 4th intercostal space. A horizontal incision was made using the incision tool. The introducer was then used to create a subcutaneous tunnel and carefully deploy the device. Local pressure was held to ensure hemostasis. Telemetry showed an excellent signal. R wave 0.5 ms, visible P waves. The incision was closed with SteriStrips and a sterile dressing was applied.   [name], MD, [facility] [phone] office [phone] pager 09/17/2013 1:15 PM

## 2013-09-17 NOTE — Interval H&P Note (Signed)
History and Physical Interval Note:  09/17/2013 10:19 AM  [name]  has presented today for surgery, with the diagnosis of abnormal heart rhythm  The various methods of treatment have been discussed with the patient and family. After consideration of risks, benefits and other options for treatment, the patient has consented to  Procedure(s): LOOP RECORDER IMPLANT (N/A) as a surgical intervention .  The patient's history has been reviewed, patient examined, no change in status, stable for surgery.  I have reviewed the patient's chart and labs.  Questions were answered to the patient's satisfaction.     Matika Bartell

## 2013-09-26 ENCOUNTER — Ambulatory Visit (INDEPENDENT_AMBULATORY_CARE_PROVIDER_SITE_OTHER): Payer: Medicare Other | Admitting: *Deleted

## 2013-09-26 DIAGNOSIS — R001 Bradycardia, unspecified: Secondary | ICD-10-CM

## 2013-09-26 DIAGNOSIS — I498 Other specified cardiac arrhythmias: Secondary | ICD-10-CM

## 2013-09-26 DIAGNOSIS — R55 Syncope and collapse: Secondary | ICD-10-CM

## 2013-09-26 LAB — MDC_IDC_ENUM_SESS_TYPE_INCLINIC

## 2013-09-26 NOTE — Progress Notes (Signed)
Loop check in clinic.  Pt with 0 tachy episodes; 0 brady episodes; 0 asystole.  3 Symptoms recorded episodes 1 at implant the other two for c/o dizziness post headache.  The tracings show SR with occasional PVC's.

## 2013-10-17 ENCOUNTER — Ambulatory Visit (INDEPENDENT_AMBULATORY_CARE_PROVIDER_SITE_OTHER): Payer: Medicare Other | Admitting: Cardiovascular Disease

## 2013-10-17 ENCOUNTER — Encounter: Payer: Self-pay | Admitting: Cardiovascular Disease

## 2013-10-17 VITALS — BP 162/80 | HR 76 | Resp 16 | Ht 68.5 in | Wt 169.1 lb

## 2013-10-17 DIAGNOSIS — I10 Essential (primary) hypertension: Secondary | ICD-10-CM

## 2013-10-17 DIAGNOSIS — I714 Abdominal aortic aneurysm, without rupture, unspecified: Secondary | ICD-10-CM

## 2013-10-17 DIAGNOSIS — I498 Other specified cardiac arrhythmias: Secondary | ICD-10-CM

## 2013-10-17 DIAGNOSIS — R55 Syncope and collapse: Secondary | ICD-10-CM

## 2013-10-17 DIAGNOSIS — E785 Hyperlipidemia, unspecified: Secondary | ICD-10-CM

## 2013-10-17 DIAGNOSIS — Z954 Presence of other heart-valve replacement: Secondary | ICD-10-CM

## 2013-10-17 DIAGNOSIS — R001 Bradycardia, unspecified: Secondary | ICD-10-CM

## 2013-10-17 DIAGNOSIS — I2510 Atherosclerotic heart disease of native coronary artery without angina pectoris: Secondary | ICD-10-CM

## 2013-10-17 DIAGNOSIS — Z952 Presence of prosthetic heart valve: Secondary | ICD-10-CM

## 2013-10-17 LAB — MDC_IDC_ENUM_SESS_TYPE_INCLINIC

## 2013-10-17 NOTE — Assessment & Plan Note (Signed)
Last evaluated by echocardiography in March of this year with normal valve gradients and no evidence of insufficiency. (Peak 28, mean 17 mm Hg).

## 2013-10-17 NOTE — Assessment & Plan Note (Addendum)
No presyncopal events, no arrhythmia detected by loop recorder. She has first degree AV block, left anterior fascicular block, broad QRS complex approaching full left bundle branch block.

## 2013-10-17 NOTE — Assessment & Plan Note (Signed)
To be checked at her visit with Dr. Duaine Dredge next month

## 2013-10-17 NOTE — Assessment & Plan Note (Addendum)
Blood pressure high today, but lower when recently checked. May need to increase her medications. I have asked her to keep a record of  her blood pressure and call me. She has an appointment with her primary care physician Dr. Duaine Dredge next month. Beta blockers were stopped recently because of syncope with bradycardia and trifascicular block. She is already on angiotensin receptor blocker and diuretics. Consider adding amlodipine as the next step in controlling her blood pressure. Avoid all negative chronotropic agents including beta blockers and centrally acting calcium channel blockers and clonidine.

## 2013-10-17 NOTE — Patient Instructions (Addendum)
Your home transmitter will continue to send automatic transmissions every month as well as if there is an issue found in the nightly audit.   Please call the office if you have a true symptom episode, so that it is addressed in a timely manner.   Your physician recommends that you schedule a follow-up appointment in: 1 year with Dr.Croitoru

## 2013-10-17 NOTE — Progress Notes (Signed)
Patient ID: [name], female   DOB: 05/30/1934, 79 y.o.   MRN: [id number]      Reason for office visit Followup CAD status post CABG, status post aortic valve replacement with biological prosthesis, abdominal aortic aneurysm, mitral valve prolapse, near-syncope  [name] has been doing well. She has not had any more presyncopal events. The recently implanted a loop recorder and the device has not detected any true arrhythmia. [name] has activated the recorder a few times "to see if it's working". All of these recorded events showed normal sinus rhythm urine she denies chest pain. She denies shortness of breath with usual activity. She does not have abdominal discomfort. Overall she is "doing well".   Allergies  Allergen Reactions  . Codeine Nausea And Vomiting    Patient gets violently ill  . Morphine And Related Nausea And Vomiting    Patient get violently ill    Current Outpatient Prescriptions  Medication Sig Dispense Refill  . albuterol (PROVENTIL HFA;VENTOLIN HFA) 108 (90 BASE) MCG/ACT inhaler Inhale 2 puffs into the lungs every 4 (four) hours as needed (for congestion or cough).       Marland Kitchen alendronate (FOSAMAX) 10 MG tablet Take 10 mg by mouth daily before breakfast. Sundays. Take with a full glass of water on an empty stomach.      Marland Kitchen amoxicillin (AMOXIL) 500 MG capsule Take 500 mg by mouth as needed.      Marland Kitchen aspirin 81 MG chewable tablet Chew 162 mg by mouth daily.      . betamethasone valerate lotion (VALISONE) 0.1 % Apply 1 application topically 2 (two) times daily as needed (for dry itchy ears).      . brinzolamide (AZOPT) 1 % ophthalmic suspension Place 1 drop into the left eye 2 (two) times daily.      . Calcium Carbonate-Vitamin D (CALCIUM + D PO) Take 1 tablet by mouth daily.      . fluticasone (FLOVENT HFA) 110 MCG/ACT inhaler Inhale 1 puff into the lungs 2 (two) times daily as needed (for wheezing).       . Fluticasone-Salmeterol (ADVAIR) 250-50 MCG/DOSE AEPB  Inhale 1 puff into the lungs 2 (two) times daily.      Marland Kitchen latanoprost (XALATAN) 0.005 % ophthalmic solution Place 1 drop into the left eye at bedtime.      Marland Kitchen levothyroxine (SYNTHROID, LEVOTHROID) 50 MCG tablet Take 50 mcg by mouth daily before breakfast.      . losartan (COZAAR) 100 MG tablet Take 100 mg by mouth daily.      . Multiple Vitamin (MULTIVITAMIN WITH MINERALS) TABS tablet Take 1 tablet by mouth daily.      Marland Kitchen omeprazole (PRILOSEC) 20 MG capsule Take 20 mg by mouth daily.      . simvastatin (ZOCOR) 40 MG tablet Take 40 mg by mouth every evening.      . tolterodine (DETROL LA) 2 MG 24 hr capsule Take 2 mg by mouth daily.      Marland Kitchen triamterene-hydrochlorothiazide (MAXZIDE-25) 37.5-25 MG per tablet Take 1 tablet by mouth daily.       No current facility-administered medications for this visit.    Past Medical History  Diagnosis Date  . Asthma   . Hypertension   . Coronary artery disease     CABG 03/29/08  . Aortic stenosis 03/29/2008    biological prosthetic replacement  . Dyslipidemia   . AAA (abdominal aortic aneurysm) 01/03/13    3.5x3.3cm  . LBBB (left bundle  branch block)   . PONV (postoperative nausea and vomiting)   . Hypothyroidism   . Heart murmur   . Alzheimer's dementia     [name] 08/17/2013  . MVP (mitral valve prolapse)     with mild mitral insufficiency/notes 08/17/2013  . Exertional shortness of breath   . Anemia   . Migraines     "none in years' (08/17/2013)  . Stroke 03/2008    "2 light strokes" (08/17/2013)  . Arthritis     "joints" (08/17/2013)    Past Surgical History  Procedure Laterality Date  . Coronary artery bypass graft  03/29/2008    LIMA TO LAD,SVG TO CX,SVG TO LEFT POSTEROLATERAL BRANCH  . Aortic valve replacement  03/29/2008    PERICARDIAL TISSUE VALVE  . Uterine fibroid surgery  1960's  . Retinal detachment surgery Left 1994    [name] 04/10/2008 (08/17/2013)  . Cataract extraction w/ intraocular lens implant Left   . Cardiac valve replacement     . Abdominal hysterectomy  1977  . Appendectomy  ?1977  . Hip fracture surgery Right 06/2008    "3 metal screws" (08/17/2013)  . Refractive surgery Bilateral     [name] 04/28/2008 (08/17/2013)  . Cardiac catheterization  ~ 2009    Family History  Problem Relation Age of Onset  . Heart failure Mother   . Diabetes Mother   . Hypertension Mother   . Hyperlipidemia Mother     History   Social History  . Marital Status: Divorced    Spouse Name: N/A    Number of Children: N/A  . Years of Education: N/A   Occupational History  . Not on file.   Social History Main Topics  . Smoking status: Never Smoker   . Smokeless tobacco: Never Used  . Alcohol Use: No  . Drug Use: No  . Sexual Activity: No   Other Topics Concern  . Not on file   Social History Narrative  . No narrative on file    Review of systems: The patient specifically denies any chest pain at rest or with exertion, dyspnea at rest or with exertion, orthopnea, paroxysmal nocturnal dyspnea, syncope, palpitations, focal neurological deficits, intermittent claudication, lower extremity edema, unexplained weight gain, cough, hemoptysis or wheezing.  The patient also denies abdominal pain, nausea, vomiting, dysphagia, diarrhea, constipation, polyuria, polydipsia, dysuria, hematuria, frequency, urgency, abnormal bleeding or bruising, fever, chills, unexpected weight changes, mood swings, change in skin or hair texture, change in voice quality, auditory or visual problems, allergic reactions or rashes, new musculoskeletal complaints other than usual "aches and pains".   PHYSICAL EXAM BP 162/80  Pulse 76  Resp 16  Ht 5' 8.5" (1.74 m)  Wt 169 lb 1.6 oz (76.703 kg)  BMI 25.33 kg/m2  General: Alert, oriented x3, no distress Head: no evidence of trauma, PERRL, EOMI, no exophtalmos or lid lag, no myxedema, no xanthelasma; normal ears, nose and oropharynx Neck: normal jugular venous pulsations and no hepatojugular reflux;  brisk carotid pulses without delay and no carotid bruits Chest: clear to auscultation, no signs of consolidation by percussion or palpation, normal fremitus, symmetrical and full respiratory excursions Cardiovascular: normal position and quality of the apical impulse, regular rhythm, normal first and second heart sounds, no murmurs, rubs or gallops; healthy sternotomy scar and healthy loop recorder site Abdomen: no tenderness or distention, no masses by palpation, no abnormal pulsatility or arterial bruits, normal bowel sounds, no hepatosplenomegaly Extremities: no clubbing, cyanosis or edema; 2+ radial, ulnar and brachial pulses bilaterally;  2+ right femoral, posterior tibial and dorsalis pedis pulses; 2+ left femoral, posterior tibial and dorsalis pedis pulses; no subclavian or femoral bruits Neurological: grossly nonfocal   Lipid Panel most recent 2013 total cholesterol 146, triglycerides 109, HDL 56, LDL 58  BMET    Component Value Date/Time   NA 135 08/18/2013 0410   K 3.8 08/18/2013 1250   CL 99 08/18/2013 0410   CO2 26 08/18/2013 0410   GLUCOSE 108* 08/18/2013 0410   BUN 16 08/18/2013 0410   CREATININE 1.13* 08/18/2013 0410   CALCIUM 8.6 08/18/2013 0410   GFRNONAA 46* 08/18/2013 0410   GFRAA 53* 08/18/2013 0410     ASSESSMENT AND PLAN CAD (coronary artery disease) s/p CABG - 2009 (LIMA to LAD, SVG to LCX, SVG to L-PLV Currently asymptomatic.  S/P AVR 2009 [facility] Ease 21 mm bovine pericardial Last evaluated by echocardiography in March of this year with normal valve gradients and no evidence of insufficiency. (Peak 28, mean 17 mm Hg).  AAA Small infrarenal abdominal aortic and as in the maximum diameter of 35 mm, unchanged when 2014 study is compared to 2013  Near syncope No presyncopal events, no arrhythmia detected by loop recorder. She has first degree AV block, left anterior fascicular block, broad QRS complex approaching full left bundle branch block.  HTN  (hypertension) Blood pressure high today, but lower when recently checked. May need to increase her medications. I have asked her to keep a record of  her blood pressure and call me. She has an appointment with her primary care physician Dr. Duaine Dredge next month. Beta blockers were stopped recently because of syncope with bradycardia and trifascicular block. She is already on angiotensin receptor blocker and diuretics. Consider adding amlodipine as the next step in controlling her blood pressure. Avoid all negative chronotropic agents including beta blockers and centrally acting calcium channel blockers and clonidine.  Hyperlipidemia To be checked at her visit with Dr. Duaine Dredge next month   No orders of the defined types were placed in this encounter.   Meds ordered this encounter  Medications  . omeprazole (PRILOSEC) 20 MG capsule    Sig: Take 20 mg by mouth daily.  . Fluticasone-Salmeterol (ADVAIR) 250-50 MCG/DOSE AEPB    Sig: Inhale 1 puff into the lungs 2 (two) times daily.  Marland Kitchen amoxicillin (AMOXIL) 500 MG capsule    Sig: Take 500 mg by mouth as needed.    Junious Silk, MD, [facility] [phone] office [phone] pager

## 2013-10-17 NOTE — Assessment & Plan Note (Signed)
Small infrarenal abdominal aortic and as in the maximum diameter of 35 mm, unchanged when 2014 study is compared to 2013

## 2013-10-17 NOTE — Assessment & Plan Note (Signed)
Currently asymptomatic 

## 2013-11-19 ENCOUNTER — Ambulatory Visit (INDEPENDENT_AMBULATORY_CARE_PROVIDER_SITE_OTHER): Payer: Medicare Other | Admitting: *Deleted

## 2013-11-19 DIAGNOSIS — R55 Syncope and collapse: Secondary | ICD-10-CM

## 2013-11-19 DIAGNOSIS — R001 Bradycardia, unspecified: Secondary | ICD-10-CM

## 2013-11-19 DIAGNOSIS — I498 Other specified cardiac arrhythmias: Secondary | ICD-10-CM

## 2013-11-19 LAB — MDC_IDC_ENUM_SESS_TYPE_REMOTE

## 2013-11-19 LAB — PACEMAKER DEVICE OBSERVATION

## 2013-12-11 ENCOUNTER — Ambulatory Visit (INDEPENDENT_AMBULATORY_CARE_PROVIDER_SITE_OTHER): Payer: Medicare Other | Admitting: *Deleted

## 2013-12-11 DIAGNOSIS — R55 Syncope and collapse: Secondary | ICD-10-CM

## 2013-12-11 DIAGNOSIS — I498 Other specified cardiac arrhythmias: Secondary | ICD-10-CM

## 2013-12-11 DIAGNOSIS — R001 Bradycardia, unspecified: Secondary | ICD-10-CM

## 2013-12-11 LAB — MDC_IDC_ENUM_SESS_TYPE_REMOTE

## 2013-12-11 LAB — PACEMAKER DEVICE OBSERVATION

## 2013-12-25 ENCOUNTER — Other Ambulatory Visit (HOSPITAL_COMMUNITY): Payer: Self-pay | Admitting: Cardiovascular Disease

## 2013-12-25 DIAGNOSIS — I714 Abdominal aortic aneurysm, without rupture, unspecified: Secondary | ICD-10-CM

## 2014-01-04 ENCOUNTER — Ambulatory Visit (HOSPITAL_COMMUNITY)
Admission: RE | Admit: 2014-01-04 | Discharge: 2014-01-04 | Disposition: A | Discharge status: Home / Self Care | Payer: Medicare Other | Source: Ambulatory Visit | Attending: Cardiovascular Disease | Admitting: Cardiovascular Disease

## 2014-01-04 DIAGNOSIS — I714 Abdominal aortic aneurysm, without rupture, unspecified: Secondary | ICD-10-CM | POA: Insufficient documentation

## 2014-01-04 NOTE — Progress Notes (Signed)
Aorta Duplex Completed. Kellsey Sansone, BS, RDMS, RVT  

## 2014-01-10 ENCOUNTER — Ambulatory Visit (HOSPITAL_COMMUNITY)
Admission: RE | Admit: 2014-01-10 | Discharge: 2014-01-10 | Disposition: A | Discharge status: Home / Self Care | Payer: Medicare Other | Source: Ambulatory Visit | Attending: Cardiovascular Disease | Admitting: Cardiovascular Disease

## 2014-01-10 DIAGNOSIS — I25810 Atherosclerosis of coronary artery bypass graft(s) without angina pectoris: Secondary | ICD-10-CM

## 2014-01-10 DIAGNOSIS — I447 Left bundle-branch block, unspecified: Secondary | ICD-10-CM

## 2014-01-10 DIAGNOSIS — I359 Nonrheumatic aortic valve disorder, unspecified: Secondary | ICD-10-CM

## 2014-01-10 DIAGNOSIS — I2510 Atherosclerotic heart disease of native coronary artery without angina pectoris: Secondary | ICD-10-CM | POA: Insufficient documentation

## 2014-01-10 NOTE — Progress Notes (Signed)
2D Echo Performed 01/10/2014    Jemal Miskell, RCS  

## 2014-01-11 ENCOUNTER — Telehealth: Payer: Self-pay | Admitting: *Deleted

## 2014-01-11 DIAGNOSIS — I729 Aneurysm of unspecified site: Secondary | ICD-10-CM

## 2014-01-11 NOTE — Telephone Encounter (Signed)
Echo results abdominal aortic duplex results called to patient. Voiced understanding.  Results faxed to Dr. Duaine Dredge.

## 2014-01-11 NOTE — Telephone Encounter (Signed)
Message copied by Vita Barley on Fri Jan 11, 2014  1:57 PM ------      Message from: [name]      Created: Tue Jan 08, 2014  5:12 PM       Please send a copy to Dr. Duaine Dredge ------

## 2014-01-11 NOTE — Telephone Encounter (Signed)
Message copied by Vita Barley on Fri Jan 11, 2014  4:22 PM ------      Message from: [name]      Created: Thu Jan 10, 2014  5:23 PM       Normal heart pumping function and normal gradients across the prosthetic aortic valve ------

## 2014-01-11 NOTE — Telephone Encounter (Signed)
Order placed for yearly abd aortic aneurysm duplex.

## 2014-01-14 ENCOUNTER — Ambulatory Visit (INDEPENDENT_AMBULATORY_CARE_PROVIDER_SITE_OTHER): Payer: Medicare Other | Admitting: *Deleted

## 2014-01-14 DIAGNOSIS — R55 Syncope and collapse: Secondary | ICD-10-CM

## 2014-01-14 LAB — PACEMAKER DEVICE OBSERVATION

## 2014-01-14 LAB — MDC_IDC_ENUM_SESS_TYPE_REMOTE

## 2014-02-19 ENCOUNTER — Ambulatory Visit (INDEPENDENT_AMBULATORY_CARE_PROVIDER_SITE_OTHER): Payer: Medicare Other | Admitting: *Deleted

## 2014-02-19 DIAGNOSIS — R55 Syncope and collapse: Secondary | ICD-10-CM

## 2014-03-04 ENCOUNTER — Ambulatory Visit: Payer: Medicare Other | Admitting: *Deleted

## 2014-03-04 DIAGNOSIS — R55 Syncope and collapse: Secondary | ICD-10-CM

## 2014-04-19 ENCOUNTER — Ambulatory Visit (INDEPENDENT_AMBULATORY_CARE_PROVIDER_SITE_OTHER): Payer: Medicare Other | Admitting: *Deleted

## 2014-04-19 DIAGNOSIS — R55 Syncope and collapse: Secondary | ICD-10-CM

## 2014-04-19 LAB — MDC_IDC_ENUM_SESS_TYPE_REMOTE

## 2014-04-30 NOTE — Progress Notes (Signed)
Loop recorder 

## 2014-05-14 ENCOUNTER — Encounter: Payer: Self-pay | Admitting: Cardiology

## 2014-05-16 ENCOUNTER — Encounter: Payer: Self-pay | Admitting: Cardiovascular Disease

## 2014-05-22 ENCOUNTER — Ambulatory Visit (INDEPENDENT_AMBULATORY_CARE_PROVIDER_SITE_OTHER): Payer: Medicare Other | Admitting: *Deleted

## 2014-05-22 DIAGNOSIS — R55 Syncope and collapse: Secondary | ICD-10-CM

## 2014-05-22 LAB — MDC_IDC_ENUM_SESS_TYPE_REMOTE

## 2014-05-29 NOTE — Progress Notes (Signed)
Loop recorder 

## 2014-06-10 ENCOUNTER — Encounter: Payer: Self-pay | Admitting: Cardiovascular Disease

## 2014-06-21 ENCOUNTER — Ambulatory Visit (INDEPENDENT_AMBULATORY_CARE_PROVIDER_SITE_OTHER): Payer: Medicare Other | Admitting: *Deleted

## 2014-06-21 DIAGNOSIS — R55 Syncope and collapse: Secondary | ICD-10-CM

## 2014-06-21 LAB — MDC_IDC_ENUM_SESS_TYPE_REMOTE

## 2014-06-27 NOTE — Progress Notes (Signed)
Loop recorder 

## 2014-07-01 LAB — MDC_IDC_ENUM_SESS_TYPE_REMOTE

## 2014-07-02 LAB — MDC_IDC_ENUM_SESS_TYPE_REMOTE: MDC IDC SESS DTM: 20150418050500

## 2014-07-08 ENCOUNTER — Ambulatory Visit (INDEPENDENT_AMBULATORY_CARE_PROVIDER_SITE_OTHER): Payer: Medicare Other | Admitting: *Deleted

## 2014-07-08 DIAGNOSIS — R55 Syncope and collapse: Secondary | ICD-10-CM

## 2014-07-16 ENCOUNTER — Encounter: Payer: Self-pay | Admitting: Cardiology

## 2014-07-19 ENCOUNTER — Encounter: Payer: Self-pay | Admitting: Cardiovascular Disease

## 2014-07-24 ENCOUNTER — Encounter: Payer: Self-pay | Admitting: Cardiovascular Disease

## 2014-07-24 ENCOUNTER — Ambulatory Visit: Payer: Medicare Other | Admitting: *Deleted

## 2014-07-24 DIAGNOSIS — R55 Syncope and collapse: Secondary | ICD-10-CM

## 2014-07-30 LAB — MDC_IDC_ENUM_SESS_TYPE_REMOTE

## 2014-08-02 NOTE — Progress Notes (Signed)
Loop recorder 

## 2014-08-13 ENCOUNTER — Encounter: Payer: Self-pay | Admitting: Cardiovascular Disease

## 2014-08-26 NOTE — Progress Notes (Signed)
Loop recorder 

## 2014-09-18 ENCOUNTER — Ambulatory Visit (INDEPENDENT_AMBULATORY_CARE_PROVIDER_SITE_OTHER): Payer: Medicare Other | Admitting: *Deleted

## 2014-09-18 DIAGNOSIS — R55 Syncope and collapse: Secondary | ICD-10-CM

## 2014-09-27 NOTE — Progress Notes (Signed)
Loop recorder 

## 2014-10-03 ENCOUNTER — Encounter (HOSPITAL_COMMUNITY): Payer: Self-pay | Admitting: Cardiovascular Disease

## 2014-10-08 LAB — MDC_IDC_ENUM_SESS_TYPE_REMOTE: MDC IDC SESS DTM: 20151103060500

## 2014-10-10 ENCOUNTER — Telehealth: Payer: Self-pay | Admitting: *Deleted

## 2014-10-10 NOTE — Telephone Encounter (Signed)
Transmission received.

## 2014-10-10 NOTE — Telephone Encounter (Signed)
Returned your call.

## 2014-10-21 ENCOUNTER — Encounter: Payer: Self-pay | Admitting: Cardiovascular Disease

## 2014-10-21 ENCOUNTER — Ambulatory Visit (INDEPENDENT_AMBULATORY_CARE_PROVIDER_SITE_OTHER): Payer: Medicare Other | Admitting: *Deleted

## 2014-10-21 DIAGNOSIS — R55 Syncope and collapse: Secondary | ICD-10-CM

## 2014-10-21 LAB — MDC_IDC_ENUM_SESS_TYPE_REMOTE
MDC IDC SESS DTM: 20151215214732
Zone Setting Detection Interval: 2000 ms
Zone Setting Detection Interval: 3000 ms
Zone Setting Detection Interval: 390 ms

## 2014-10-23 NOTE — Progress Notes (Signed)
Loop recorder 

## 2014-10-29 ENCOUNTER — Encounter: Payer: Self-pay | Admitting: Cardiovascular Disease

## 2014-10-29 ENCOUNTER — Ambulatory Visit (INDEPENDENT_AMBULATORY_CARE_PROVIDER_SITE_OTHER): Payer: Medicare Other | Admitting: Cardiovascular Disease

## 2014-10-29 ENCOUNTER — Other Ambulatory Visit: Payer: Self-pay | Admitting: *Deleted

## 2014-10-29 VITALS — BP 138/70 | HR 73 | Resp 16 | Ht 69.0 in | Wt 156.2 lb

## 2014-10-29 DIAGNOSIS — Z954 Presence of other heart-valve replacement: Secondary | ICD-10-CM

## 2014-10-29 DIAGNOSIS — R001 Bradycardia, unspecified: Secondary | ICD-10-CM

## 2014-10-29 DIAGNOSIS — I441 Atrioventricular block, second degree: Secondary | ICD-10-CM

## 2014-10-29 DIAGNOSIS — I455 Other specified heart block: Secondary | ICD-10-CM

## 2014-10-29 DIAGNOSIS — R5383 Other fatigue: Secondary | ICD-10-CM

## 2014-10-29 DIAGNOSIS — R55 Syncope and collapse: Secondary | ICD-10-CM

## 2014-10-29 DIAGNOSIS — Z79899 Other long term (current) drug therapy: Secondary | ICD-10-CM

## 2014-10-29 DIAGNOSIS — I2510 Atherosclerotic heart disease of native coronary artery without angina pectoris: Secondary | ICD-10-CM

## 2014-10-29 DIAGNOSIS — Z7901 Long term (current) use of anticoagulants: Secondary | ICD-10-CM

## 2014-10-29 DIAGNOSIS — Z952 Presence of prosthetic heart valve: Secondary | ICD-10-CM

## 2014-10-29 LAB — COMPREHENSIVE METABOLIC PANEL
ALT: 25 U/L (ref 0–35)
AST: 27 U/L (ref 0–37)
Albumin: 4 g/dL (ref 3.5–5.2)
Alkaline Phosphatase: 75 U/L (ref 39–117)
BUN: 12 mg/dL (ref 6–23)
CO2: 30 mEq/L (ref 19–32)
Calcium: 8.7 mg/dL (ref 8.4–10.5)
Chloride: 95 mEq/L — ABNORMAL LOW (ref 96–112)
Creat: 0.97 mg/dL (ref 0.50–1.10)
Glucose, Bld: 130 mg/dL — ABNORMAL HIGH (ref 70–99)
Potassium: 3.5 mEq/L (ref 3.5–5.3)
SODIUM: 137 meq/L (ref 135–145)
TOTAL PROTEIN: 6.4 g/dL (ref 6.0–8.3)
Total Bilirubin: 0.9 mg/dL (ref 0.2–1.2)

## 2014-10-29 LAB — MDC_IDC_ENUM_SESS_TYPE_INCLINIC
Date Time Interrogation Session: 20160105135506
Zone Setting Detection Interval: 2000 ms
Zone Setting Detection Interval: 3000 ms
Zone Setting Detection Interval: 390 ms

## 2014-10-29 LAB — CBC
HCT: 41.1 % (ref 36.0–46.0)
Hemoglobin: 14 g/dL (ref 12.0–15.0)
MCH: 31.1 pg (ref 26.0–34.0)
MCHC: 34.1 g/dL (ref 30.0–36.0)
MCV: 91.3 fL (ref 78.0–100.0)
MPV: 9.9 fL (ref 8.6–12.4)
PLATELETS: 169 10*3/uL (ref 150–400)
RBC: 4.5 MIL/uL (ref 3.87–5.11)
RDW: 13 % (ref 11.5–15.5)
WBC: 7.1 10*3/uL (ref 4.0–10.5)

## 2014-10-29 NOTE — Patient Instructions (Addendum)
Your physician has recommended that you have a MEDTRONIC pacemaker inserted and Loop Recorder removed Tuesday November 05, 2014. A pacemaker is a small device that is placed under the skin of your chest or abdomen to help control abnormal heart rhythms. This device uses electrical pulses to prompt the heart to beat at a normal rate. Pacemakers are used to treat heart rhythms that are too slow. Wire (leads) are attached to the pacemaker that goes into the chambers of you heart. This is done in the hospital and usually requires and overnight stay. Please see the instruction sheet given to you today for more information.  Your physician recommends that you return for lab work in: 3-7 days prior to the above procedure at [facility].

## 2014-10-30 ENCOUNTER — Encounter: Payer: Self-pay | Admitting: Cardiovascular Disease

## 2014-10-30 LAB — PROTIME-INR
INR: 1 (ref <1.50)
Prothrombin Time: 13.2 seconds (ref 11.6–15.2)

## 2014-10-30 LAB — APTT: APTT: 32 s (ref 24–37)

## 2014-10-31 ENCOUNTER — Encounter: Payer: Self-pay | Admitting: Cardiovascular Disease

## 2014-10-31 DIAGNOSIS — I455 Other specified heart block: Secondary | ICD-10-CM

## 2014-10-31 DIAGNOSIS — I441 Atrioventricular block, second degree: Secondary | ICD-10-CM | POA: Insufficient documentation

## 2014-10-31 HISTORY — DX: Atrioventricular block, second degree: I44.1

## 2014-10-31 HISTORY — DX: Other specified heart block: I45.5

## 2014-10-31 NOTE — Progress Notes (Signed)
Patient ID: [name], female   DOB: 12/25/1933, 80 y.o.   MRN: [id number]      Reason for office visit Second degree AV block with >9" pauses  [name]'s loop recorder has recently shown 14 significant pauses, all due to second degree AV block, longest 9.5 seconds in duration (6 consecutive non conducted P waves). Many occurred during the early morning, but some while awake. The device was implanted for recurrent near syncope and at least one full blown syncope event in November 2014. She has a long-standing history of cardiac illness and previously underwent aortic valve replacement with biological prosthesis and multivessel coronary bypass surgery. Normal LV function and AV prosthesis function by echo 12/2013.   Allergies  Allergen Reactions  . Codeine Nausea And Vomiting    Patient gets violently ill  . Morphine And Related Nausea And Vomiting    Patient get violently ill    Current Outpatient Prescriptions  Medication Sig Dispense Refill  . albuterol (PROVENTIL HFA;VENTOLIN HFA) 108 (90 BASE) MCG/ACT inhaler Inhale 2 puffs into the lungs every 4 (four) hours as needed (for congestion or cough).     . alendronate (FOSAMAX) 10 MG tablet Take 10 mg by mouth daily before breakfast. Sundays. Take with a full glass of water on an empty stomach.    . amoxicillin (AMOXIL) 500 MG capsule Take 500 mg by mouth as needed.    . aspirin 81 MG chewable tablet Chew 162 mg by mouth daily.    . atorvastatin (LIPITOR) 40 MG tablet Take 1 tablet by mouth daily.  0  . betamethasone valerate lotion (VALISONE) 0.1 % Apply 1 application topically 2 (two) times daily as needed (for dry itchy ears).    . brinzolamide (AZOPT) 1 % ophthalmic suspension Place 1 drop into the left eye 2 (two) times daily.    . Calcium Carbonate-Vitamin D (CALCIUM + D PO) Take 1 tablet by mouth daily.    . donepezil (ARICEPT) 10 MG tablet Take 10 mg by mouth at bedtime.    . fluticasone (FLOVENT HFA) 110 MCG/ACT inhaler  Inhale 1 puff into the lungs 2 (two) times daily as needed (for wheezing).     . Fluticasone-Salmeterol (ADVAIR) 250-50 MCG/DOSE AEPB Inhale 1 puff into the lungs 2 (two) times daily.    . latanoprost (XALATAN) 0.005 % ophthalmic solution Place 1 drop into the left eye at bedtime.    . levothyroxine (SYNTHROID, LEVOTHROID) 50 MCG tablet Take 50 mcg by mouth daily before breakfast.    . losartan (COZAAR) 100 MG tablet Take 100 mg by mouth daily.    . memantine (NAMENDA) 10 MG tablet Take 10 mg by mouth 2 (two) times daily.    . Multiple Vitamin (MULTIVITAMIN WITH MINERALS) TABS tablet Take 1 tablet by mouth daily.    . omeprazole (PRILOSEC) 20 MG capsule Take 20 mg by mouth daily.    . simvastatin (ZOCOR) 40 MG tablet Take 40 mg by mouth every evening.    . tolterodine (DETROL LA) 2 MG 24 hr capsule Take 2 mg by mouth daily.    . triamterene-hydrochlorothiazide (MAXZIDE-25) 37.5-25 MG per tablet Take 1 tablet by mouth daily.     No current facility-administered medications for this visit.    Past Medical History  Diagnosis Date  . Asthma   . Hypertension   . Coronary artery disease     CABG 03/29/08  . Aortic stenosis 03/29/2008    biological prosthetic replacement  . Dyslipidemia   .   AAA (abdominal aortic aneurysm) 01/03/13    3.5x3.3cm  . LBBB (left bundle branch block)   . PONV (postoperative nausea and vomiting)   . Hypothyroidism   . Heart murmur   . Alzheimer's dementia     /notes 08/17/2013  . MVP (mitral valve prolapse)     with mild mitral insufficiency/notes 08/17/2013  . Exertional shortness of breath   . Anemia   . Migraines     "none in years' (08/17/2013)  . Stroke 03/2008    "2 light strokes" (08/17/2013)  . Arthritis     "joints" (08/17/2013)  . Sinus pause 10/31/2014  . Mobitz type 2 second degree AV block 10/31/2014    Past Surgical History  Procedure Laterality Date  . Coronary artery bypass graft  03/29/2008    LIMA TO LAD,SVG TO CX,SVG TO LEFT POSTEROLATERAL  BRANCH  . Aortic valve replacement  03/29/2008    PERICARDIAL TISSUE VALVE  . Uterine fibroid surgery  1960's  . Retinal detachment surgery Left 1994    /notes 04/10/2008 (08/17/2013)  . Cataract extraction w/ intraocular lens implant Left   . Cardiac valve replacement    . Abdominal hysterectomy  1977  . Appendectomy  ?1977  . Hip fracture surgery Right 06/2008    "3 metal screws" (08/17/2013)  . Refractive surgery Bilateral     /notes 04/28/2008 (08/17/2013)  . Cardiac catheterization  ~ 2009  . Loop recorder implant N/A 09/17/2013    Procedure: LOOP RECORDER IMPLANT;  Surgeon: Dejah Droessler, MD;  Location: [facility];  Service: Cardiovascular;  Laterality: N/A;    Family History  Problem Relation Age of Onset  . Heart failure Mother   . Diabetes Mother   . Hypertension Mother   . Hyperlipidemia Mother     History   Social History  . Marital Status: Divorced    Spouse Name: N/A    Number of Children: N/A  . Years of Education: N/A   Occupational History  . Not on file.   Social History Main Topics  . Smoking status: Never Smoker   . Smokeless tobacco: Never Used  . Alcohol Use: No  . Drug Use: No  . Sexual Activity: No   Other Topics Concern  . Not on file   Social History Narrative    Review of systems: The patient specifically denies any chest pain at rest or with exertion, dyspnea at rest or with exertion, orthopnea, paroxysmal nocturnal dyspnea, syncope, palpitations, focal neurological deficits, intermittent claudication, lower extremity edema, unexplained weight gain, cough, hemoptysis or wheezing.  The patient also denies abdominal pain, nausea, vomiting, dysphagia, diarrhea, constipation, polyuria, polydipsia, dysuria, hematuria, frequency, urgency, abnormal bleeding or bruising, fever, chills, unexpected weight changes, mood swings, change in skin or hair texture, change in voice quality, auditory or visual problems, allergic reactions or rashes, new  musculoskeletal complaints other than usual "aches and pains".   PHYSICAL EXAM BP 138/70 mmHg  Pulse 73  Ht 5' 9" (1.753 m)  Wt 156 lb 3.2 oz (70.852 kg)  BMI 23.06 kg/m2 General: Alert, oriented x3, no distress Head: no evidence of trauma, PERRL, EOMI, no exophtalmos or lid lag, no myxedema, no xanthelasma; normal ears, nose and oropharynx Neck: normal jugular venous pulsations and no hepatojugular reflux; brisk carotid pulses without delay and no carotid bruits Chest: clear to auscultation, no signs of consolidation by percussion or palpation, normal fremitus, symmetrical and full respiratory excursions Cardiovascular: normal position and quality of the apical impulse, regular rhythm, normal first   and second heart sounds, no murmurs, rubs or gallops; healthy sternotomy scar and healthy loop recorder site Abdomen: no tenderness or distention, no masses by palpation, no abnormal pulsatility or arterial bruits, normal bowel sounds, no hepatosplenomegaly Extremities: no clubbing, cyanosis or edema; 2+ radial, ulnar and brachial pulses bilaterally; 2+ right femoral, posterior tibial and dorsalis pedis pulses; 2+ left femoral, posterior tibial and dorsalis pedis pulses; no subclavian or femoral bruits Neurological: grossly nonfocal  EKG: NSR, RBBB, LAFB  Lipid Panel     Component Value Date/Time   CHOL  04/10/2008 0500    94        ATP III CLASSIFICATION:  <200     mg/dL   Desirable  200-239  mg/dL   Borderline High  >=240    mg/dL   High   TRIG 77 04/10/2008 0500   HDL 26* 04/10/2008 0500   CHOLHDL 3.6 04/10/2008 0500   VLDL 15 04/10/2008 0500   LDLCALC  04/10/2008 0500    53        Total Cholesterol/HDL:CHD Risk Coronary Heart Disease Risk Table                     Men   Women  1/2 Average Risk   3.4   3.3    BMET    Component Value Date/Time   NA 137 10/29/2014 1048   K 3.5 10/29/2014 1048   CL 95* 10/29/2014 1048   CO2 30 10/29/2014 1048   GLUCOSE 130* 10/29/2014 1048     BUN 12 10/29/2014 1048   CREATININE 0.97 10/29/2014 1048   CREATININE 1.13* 08/18/2013 0410   CALCIUM 8.7 10/29/2014 1048   GFRNONAA 46* 08/18/2013 0410   GFRAA 53* 08/18/2013 0410     ASSESSMENT AND PLAN  Second degree Mobitz type 2 AV block and history of syncope  The mechanism appears to be clearly infrahisian (wide QRS, high grade AV block) and she has attributable symptoms . She should undergo implantation of a dual chamber permanent transvenous pacemaker. A thorough review of the procedure, risks/benefits/complications was presented and she agrees to proceed. ILR will be removed in same setting.  Orders Placed This Encounter  Procedures  . APTT  . CBC  . Comprehensive metabolic panel  . Protime-INR  . Implantable device check  . EKG 12-Lead  . PERMANENT PACEMAKER INSERTION  . LOOP RECORDER EXPLANT   Meds ordered this encounter  Medications  . atorvastatin (LIPITOR) 40 MG tablet    Sig: Take 1 tablet by mouth daily.    Refill:  0  . donepezil (ARICEPT) 10 MG tablet    Sig: Take 10 mg by mouth at bedtime.  . memantine (NAMENDA) 10 MG tablet    Sig: Take 10 mg by mouth 2 (two) times daily.    Trig Mcbryar  Achol Azpeitia, MD, FACC [facility] [phone] office [phone] pager   

## 2014-11-04 ENCOUNTER — Encounter (HOSPITAL_COMMUNITY): Payer: Self-pay | Admitting: Pharmacy Technician

## 2014-11-04 DIAGNOSIS — G309 Alzheimer's disease, unspecified: Secondary | ICD-10-CM | POA: Diagnosis not present

## 2014-11-04 DIAGNOSIS — I10 Essential (primary) hypertension: Secondary | ICD-10-CM | POA: Diagnosis not present

## 2014-11-04 DIAGNOSIS — I2510 Atherosclerotic heart disease of native coronary artery without angina pectoris: Secondary | ICD-10-CM | POA: Diagnosis not present

## 2014-11-04 DIAGNOSIS — Z7982 Long term (current) use of aspirin: Secondary | ICD-10-CM | POA: Diagnosis not present

## 2014-11-04 DIAGNOSIS — Z8673 Personal history of transient ischemic attack (TIA), and cerebral infarction without residual deficits: Secondary | ICD-10-CM | POA: Diagnosis not present

## 2014-11-04 DIAGNOSIS — Z951 Presence of aortocoronary bypass graft: Secondary | ICD-10-CM | POA: Diagnosis not present

## 2014-11-04 DIAGNOSIS — R55 Syncope and collapse: Secondary | ICD-10-CM | POA: Diagnosis not present

## 2014-11-04 DIAGNOSIS — F0280 Dementia in other diseases classified elsewhere without behavioral disturbance: Secondary | ICD-10-CM | POA: Diagnosis not present

## 2014-11-04 DIAGNOSIS — I441 Atrioventricular block, second degree: Secondary | ICD-10-CM | POA: Diagnosis not present

## 2014-11-04 DIAGNOSIS — Z952 Presence of prosthetic heart valve: Secondary | ICD-10-CM | POA: Diagnosis not present

## 2014-11-04 DIAGNOSIS — J45909 Unspecified asthma, uncomplicated: Secondary | ICD-10-CM | POA: Diagnosis not present

## 2014-11-04 DIAGNOSIS — E785 Hyperlipidemia, unspecified: Secondary | ICD-10-CM | POA: Diagnosis not present

## 2014-11-04 DIAGNOSIS — E039 Hypothyroidism, unspecified: Secondary | ICD-10-CM | POA: Diagnosis not present

## 2014-11-04 MED ORDER — SODIUM CHLORIDE 0.9 % IJ SOLN: 3.0000 mL | INTRAMUSCULAR | Status: DC | PRN

## 2014-11-04 MED ORDER — CEFAZOLIN SODIUM-DEXTROSE 2-3 GM-% IV SOLR: 2.0000 g | INTRAVENOUS | Status: DC

## 2014-11-04 MED ORDER — SODIUM CHLORIDE 0.9 % IV SOLN
INTRAVENOUS | Status: DC
  Administered 2014-11-05: 12:00:00 via INTRAVENOUS

## 2014-11-04 MED ORDER — SODIUM CHLORIDE 0.9 % IR SOLN
80.0000 mg | Status: DC
  Filled 2014-11-04: qty 2

## 2014-11-05 ENCOUNTER — Ambulatory Visit (HOSPITAL_COMMUNITY)
Admission: RE | Admit: 2014-11-05 | Discharge: 2014-11-06 | Disposition: A | Discharge status: Home / Self Care | Payer: Medicare Other | Source: Ambulatory Visit | Attending: Cardiovascular Disease | Admitting: Cardiovascular Disease

## 2014-11-05 ENCOUNTER — Encounter (HOSPITAL_COMMUNITY)
Admission: RE | Disposition: A | Discharge status: Home / Self Care | Payer: Self-pay | Source: Ambulatory Visit | Attending: Cardiovascular Disease

## 2014-11-05 ENCOUNTER — Telehealth: Payer: Self-pay | Admitting: Cardiovascular Disease

## 2014-11-05 ENCOUNTER — Encounter (HOSPITAL_COMMUNITY): Payer: Self-pay | Admitting: General Practice

## 2014-11-05 DIAGNOSIS — Z7982 Long term (current) use of aspirin: Secondary | ICD-10-CM | POA: Insufficient documentation

## 2014-11-05 DIAGNOSIS — I441 Atrioventricular block, second degree: Secondary | ICD-10-CM | POA: Diagnosis present

## 2014-11-05 DIAGNOSIS — Z952 Presence of prosthetic heart valve: Secondary | ICD-10-CM | POA: Insufficient documentation

## 2014-11-05 DIAGNOSIS — I10 Essential (primary) hypertension: Secondary | ICD-10-CM | POA: Insufficient documentation

## 2014-11-05 DIAGNOSIS — I2510 Atherosclerotic heart disease of native coronary artery without angina pectoris: Secondary | ICD-10-CM | POA: Insufficient documentation

## 2014-11-05 DIAGNOSIS — R55 Syncope and collapse: Secondary | ICD-10-CM | POA: Diagnosis present

## 2014-11-05 DIAGNOSIS — Z951 Presence of aortocoronary bypass graft: Secondary | ICD-10-CM | POA: Insufficient documentation

## 2014-11-05 DIAGNOSIS — Z950 Presence of cardiac pacemaker: Secondary | ICD-10-CM

## 2014-11-05 DIAGNOSIS — E785 Hyperlipidemia, unspecified: Secondary | ICD-10-CM | POA: Insufficient documentation

## 2014-11-05 DIAGNOSIS — G309 Alzheimer's disease, unspecified: Secondary | ICD-10-CM | POA: Insufficient documentation

## 2014-11-05 DIAGNOSIS — J45909 Unspecified asthma, uncomplicated: Secondary | ICD-10-CM | POA: Insufficient documentation

## 2014-11-05 DIAGNOSIS — Z8673 Personal history of transient ischemic attack (TIA), and cerebral infarction without residual deficits: Secondary | ICD-10-CM | POA: Insufficient documentation

## 2014-11-05 DIAGNOSIS — I455 Other specified heart block: Secondary | ICD-10-CM

## 2014-11-05 DIAGNOSIS — F0280 Dementia in other diseases classified elsewhere without behavioral disturbance: Secondary | ICD-10-CM | POA: Insufficient documentation

## 2014-11-05 DIAGNOSIS — E039 Hypothyroidism, unspecified: Secondary | ICD-10-CM | POA: Insufficient documentation

## 2014-11-05 HISTORY — DX: Presence of cardiac pacemaker: Z95.0

## 2014-11-05 HISTORY — PX: LOOP RECORDER EXPLANT: SHX5476

## 2014-11-05 HISTORY — PX: INSERT / REPLACE / REMOVE PACEMAKER: SUR710

## 2014-11-05 HISTORY — DX: Chronic obstructive pulmonary disease, unspecified: J44.9

## 2014-11-05 HISTORY — PX: PERMANENT PACEMAKER INSERTION: SHX5480

## 2014-11-05 HISTORY — DX: Frequency of micturition: R35.0

## 2014-11-05 HISTORY — PX: TIBIAL TUBERCLERPLASTY: SHX5953

## 2014-11-05 HISTORY — DX: Family history of other specified conditions: Z84.89

## 2014-11-05 HISTORY — DX: Other specified chronic obstructive pulmonary disease: J44.89

## 2014-11-05 LAB — SURGICAL PCR SCREEN
MRSA, PCR: NEGATIVE
STAPHYLOCOCCUS AUREUS: NEGATIVE

## 2014-11-05 SURGERY — LOOP RECORDER EXPLANT

## 2014-11-05 MED ORDER — LOSARTAN POTASSIUM 50 MG PO TABS
100.0000 mg | ORAL_TABLET | Freq: Every morning | ORAL | Status: DC
  Administered 2014-11-06: 100 mg via ORAL
  Filled 2014-11-05: qty 2

## 2014-11-05 MED ORDER — ALBUTEROL SULFATE (2.5 MG/3ML) 0.083% IN NEBU: 3.0000 mL | INHALATION_SOLUTION | RESPIRATORY_TRACT | Status: DC | PRN

## 2014-11-05 MED ORDER — MUPIROCIN 2 % EX OINT
TOPICAL_OINTMENT | CUTANEOUS | Status: AC
  Administered 2014-11-05: 1 via TOPICAL
  Filled 2014-11-05: qty 22

## 2014-11-05 MED ORDER — DONEPEZIL HCL 10 MG PO TABS
10.0000 mg | ORAL_TABLET | Freq: Every day | ORAL | Status: DC
  Administered 2014-11-05: 10 mg via ORAL
  Filled 2014-11-05: qty 1

## 2014-11-05 MED ORDER — CEFAZOLIN SODIUM 1-5 GM-% IV SOLN
1.0000 g | Freq: Four times a day (QID) | INTRAVENOUS | Status: AC
  Administered 2014-11-05 – 2014-11-06 (×3): 1 g via INTRAVENOUS
  Filled 2014-11-05 (×3): qty 50

## 2014-11-05 MED ORDER — LATANOPROST 0.005 % OP SOLN
1.0000 [drp] | Freq: Every day | OPHTHALMIC | Status: DC
  Administered 2014-11-05: 1 [drp] via OPHTHALMIC
  Filled 2014-11-05: qty 2.5

## 2014-11-05 MED ORDER — LEVOTHYROXINE SODIUM 50 MCG PO TABS
50.0000 ug | ORAL_TABLET | Freq: Every day | ORAL | Status: DC
  Administered 2014-11-06: 50 ug via ORAL
  Filled 2014-11-05: qty 1

## 2014-11-05 MED ORDER — FENTANYL CITRATE 0.05 MG/ML IJ SOLN
INTRAMUSCULAR | Status: AC
  Filled 2014-11-05: qty 2

## 2014-11-05 MED ORDER — MIDAZOLAM HCL 5 MG/5ML IJ SOLN
INTRAMUSCULAR | Status: AC
  Filled 2014-11-05: qty 5

## 2014-11-05 MED ORDER — PANTOPRAZOLE SODIUM 40 MG PO TBEC
40.0000 mg | DELAYED_RELEASE_TABLET | Freq: Every day | ORAL | Status: DC
  Administered 2014-11-06: 40 mg via ORAL
  Filled 2014-11-05: qty 1

## 2014-11-05 MED ORDER — ACETAMINOPHEN 325 MG PO TABS: 325.0000 mg | ORAL_TABLET | ORAL | Status: DC | PRN

## 2014-11-05 MED ORDER — TRIAMTERENE-HCTZ 37.5-25 MG PO TABS
1.0000 | ORAL_TABLET | Freq: Every morning | ORAL | Status: DC
  Administered 2014-11-06: 1 via ORAL
  Filled 2014-11-05: qty 1

## 2014-11-05 MED ORDER — ASPIRIN 81 MG PO CHEW
162.0000 mg | CHEWABLE_TABLET | Freq: Every morning | ORAL | Status: DC
  Administered 2014-11-06: 162 mg via ORAL
  Filled 2014-11-05: qty 2

## 2014-11-05 MED ORDER — SODIUM CHLORIDE 0.9 % IV SOLN: INTRAVENOUS | Status: AC

## 2014-11-05 MED ORDER — HEPARIN (PORCINE) IN NACL 2-0.9 UNIT/ML-% IJ SOLN
INTRAMUSCULAR | Status: AC
  Filled 2014-11-05: qty 1000

## 2014-11-05 MED ORDER — LIDOCAINE HCL (PF) 1 % IJ SOLN
INTRAMUSCULAR | Status: AC
  Filled 2014-11-05: qty 60

## 2014-11-05 MED ORDER — ONDANSETRON HCL 4 MG/2ML IJ SOLN: 4.0000 mg | Freq: Four times a day (QID) | INTRAMUSCULAR | Status: DC | PRN

## 2014-11-05 MED ORDER — MUPIROCIN 2 % EX OINT
1.0000 "application " | TOPICAL_OINTMENT | Freq: Once | CUTANEOUS | Status: AC
  Administered 2014-11-05: 1 via TOPICAL
  Filled 2014-11-05: qty 22

## 2014-11-05 MED ORDER — FLUTICASONE PROPIONATE HFA 110 MCG/ACT IN AERO: 1.0000 | INHALATION_SPRAY | Freq: Two times a day (BID) | RESPIRATORY_TRACT | Status: DC | PRN

## 2014-11-05 MED ORDER — SIMVASTATIN 40 MG PO TABS
40.0000 mg | ORAL_TABLET | Freq: Every evening | ORAL | Status: DC
  Administered 2014-11-05: 40 mg via ORAL
  Filled 2014-11-05: qty 1

## 2014-11-05 MED ORDER — MEMANTINE HCL 5 MG PO TABS
10.0000 mg | ORAL_TABLET | Freq: Two times a day (BID) | ORAL | Status: DC
  Administered 2014-11-05 – 2014-11-06 (×2): 10 mg via ORAL
  Filled 2014-11-05 (×2): qty 2

## 2014-11-05 MED ORDER — BRINZOLAMIDE 1 % OP SUSP
1.0000 [drp] | Freq: Two times a day (BID) | OPHTHALMIC | Status: DC
  Administered 2014-11-05 – 2014-11-06 (×2): 1 [drp] via OPHTHALMIC
  Filled 2014-11-05: qty 10

## 2014-11-05 MED ORDER — MOMETASONE FURO-FORMOTEROL FUM 100-5 MCG/ACT IN AERO
2.0000 | INHALATION_SPRAY | Freq: Two times a day (BID) | RESPIRATORY_TRACT | Status: DC
  Administered 2014-11-06: 2 via RESPIRATORY_TRACT
  Filled 2014-11-05: qty 8.8

## 2014-11-05 NOTE — Telephone Encounter (Signed)
Closed encounter °

## 2014-11-05 NOTE — Op Note (Addendum)
Procedure report  Procedure performed:  1. Implantation of new dual chamber permanent pacemaker 2. Fluoroscopy 3. Light sedation 4. Loop recorder explantation   Reason for procedure: Syncope due to: Second degree atrioventricular block Mobitz type II, high grade  Procedure performed by: [name], MD  Complications: None  Estimated blood loss: <10 mL  Medications administered during procedure: Ancef  g intravenously Lidocaine 1% 30 mL locally,  Fentanyl 75 mcg intravenously Versed 4 mg intravenously  Device details: Generator Medtronic Adapta  model ADDRL1 serial number [id number] H Right atrial lead Medtronic [id number] serial number [id number] Right ventricular lead Medtronic [id number] serial number [id number]  Procedure details:  After the risks and benefits of the procedure were discussed the patient provided informed consent and was brought to the cardiac cath lab in the fasting state. The patient was prepped and draped in usual sterile fashion. Local anesthesia with 1% lidocaine was administered to to the left infraclavicular area. A 5-6 cm horizontal incision was made parallel with and 2-3 cm caudal to the left clavicle. Using electrocautery and blunt dissection a prepectoral pocket was created down to the level of the pectoralis major muscle fascia. The pocket was carefully inspected for hemostasis. An antibiotic-soaked sponge was placed in the pocket.  Under fluoroscopic guidance and using the modified Seldinger technique 2 separate venipunctures were performed to access the left subclavian vein. No difficulty was encountered accessing the vein.  Two J-tip guidewires were subsequently exchanged for two 7 French safe sheaths.  Under fluoroscopic guidance the ventricular lead was advanced to level of the mid to apical right ventricular septum and thet active-fixation helix was deployed. Prominent current of injury was seen. Satisfactory pacing and sensing parameters were  recorded. There was no evidence of diaphragmatic stimulation at maximum device output. The safe sheath was peeled away and the lead was secured in place with 2-0 silk.  In similar fashion the right atrial lead was advanced to the level of the atrial appendage. The active-fixation helix was deployed. There was prominent current of injury. Satisfactory  pacing and sensing parameters were recorded. There was no evidence of diaphragmatic stimulation with pacing at maximum device output. The safe sheath was peeled away and the lead was secured in place with 2-0 silk.  The antibiotic-soaked sponge was removed from the pocket. The pocket was flushed with copious amounts of antibiotic solution. Reinspection showed excellent hemostasis..  The ventricular lead was connected to the generator and appropriate ventricular pacing was seen. Subsequently the atrial lead was also connected. Repeat testing of the lead parameters later showed excellent values.  The entire system was then carefully inserted in the pocket with care been taking that the leads and device assumed a comfortable position without pressure on the incision. Great care was taken that the leads be located deep to the generator. The pocket was then closed in layers using 2 layers of 2-0 Vicryl and cutaneous staples, after which a sterile dressing was applied.  At the end of the procedure the following lead parameters were encountered:  Right atrial lead  sensed P waves 3.3 mV, impedance 753 ohms, threshold 0.8 V at 0.5 ms pulse width.  Right ventricular lead sensed R waves 7.8 mV, impedance 999 ohms, threshold 0.6 V at 0.5 ms pulse width.  Subsequently, the neighboring loop recorder site (which was included in the surgical area) was anesthetized with 1% lidocaine. A 1 cm incision was made and the loop recorder was easily dissected out and removed. A couple of 2-0 Vicryl stitches  and 3 cutaneous staples were placed, after which a sterile dressing  was applied.   [name], MD, [facility] [phone] office [phone] pager

## 2014-11-05 NOTE — Progress Notes (Signed)
UR Completed Xzaria Teo Graves-Bigelow, RN,BSN [phone]  

## 2014-11-05 NOTE — Interval H&P Note (Signed)
History and Physical Interval Note:  11/05/2014 1:13 PM  [name]  has presented today for surgery, with the diagnosis of pause  The various methods of treatment have been discussed with the patient and family. After consideration of risks, benefits and other options for treatment, the patient has consented to  Procedure(s): LOOP RECORDER EXPLANT (N/A) PERMANENT PACEMAKER INSERTION (N/A) as a surgical intervention .  The patient's history has been reviewed, patient examined, no change in status, stable for surgery.  I have reviewed the patient's chart and labs.  Questions were answered to the patient's satisfaction.     [name]

## 2014-11-05 NOTE — H&P (View-Only) (Signed)
Patient ID: [name], female   DOB: 19-Nov-1933, 81 y.o.   MRN: [id number]      Reason for office visit Second degree AV block with >9" pauses  [name] loop recorder has recently shown 14 significant pauses, all due to second degree AV block, longest 9.5 seconds in duration (6 consecutive non conducted P waves). Many occurred during the early morning, but some while awake. The device was implanted for recurrent near syncope and at least one full blown syncope event in November 2014. She has a long-standing history of cardiac illness and previously underwent aortic valve replacement with biological prosthesis and multivessel coronary bypass surgery. Normal LV function and AV prosthesis function by echo 12/2013.   Allergies  Allergen Reactions  . Codeine Nausea And Vomiting    Patient gets violently ill  . Morphine And Related Nausea And Vomiting    Patient get violently ill    Current Outpatient Prescriptions  Medication Sig Dispense Refill  . albuterol (PROVENTIL HFA;VENTOLIN HFA) 108 (90 BASE) MCG/ACT inhaler Inhale 2 puffs into the lungs every 4 (four) hours as needed (for congestion or cough).     Marland Kitchen alendronate (FOSAMAX) 10 MG tablet Take 10 mg by mouth daily before breakfast. Sundays. Take with a full glass of water on an empty stomach.    Marland Kitchen amoxicillin (AMOXIL) 500 MG capsule Take 500 mg by mouth as needed.    Marland Kitchen aspirin 81 MG chewable tablet Chew 162 mg by mouth daily.    Marland Kitchen atorvastatin (LIPITOR) 40 MG tablet Take 1 tablet by mouth daily.  0  . betamethasone valerate lotion (VALISONE) 0.1 % Apply 1 application topically 2 (two) times daily as needed (for dry itchy ears).    . brinzolamide (AZOPT) 1 % ophthalmic suspension Place 1 drop into the left eye 2 (two) times daily.    . Calcium Carbonate-Vitamin D (CALCIUM + D PO) Take 1 tablet by mouth daily.    Marland Kitchen donepezil (ARICEPT) 10 MG tablet Take 10 mg by mouth at bedtime.    . fluticasone (FLOVENT HFA) 110 MCG/ACT inhaler  Inhale 1 puff into the lungs 2 (two) times daily as needed (for wheezing).     . Fluticasone-Salmeterol (ADVAIR) 250-50 MCG/DOSE AEPB Inhale 1 puff into the lungs 2 (two) times daily.    Marland Kitchen latanoprost (XALATAN) 0.005 % ophthalmic solution Place 1 drop into the left eye at bedtime.    Marland Kitchen levothyroxine (SYNTHROID, LEVOTHROID) 50 MCG tablet Take 50 mcg by mouth daily before breakfast.    . losartan (COZAAR) 100 MG tablet Take 100 mg by mouth daily.    . memantine (NAMENDA) 10 MG tablet Take 10 mg by mouth 2 (two) times daily.    . Multiple Vitamin (MULTIVITAMIN WITH MINERALS) TABS tablet Take 1 tablet by mouth daily.    Marland Kitchen omeprazole (PRILOSEC) 20 MG capsule Take 20 mg by mouth daily.    . simvastatin (ZOCOR) 40 MG tablet Take 40 mg by mouth every evening.    . tolterodine (DETROL LA) 2 MG 24 hr capsule Take 2 mg by mouth daily.    Marland Kitchen triamterene-hydrochlorothiazide (MAXZIDE-25) 37.5-25 MG per tablet Take 1 tablet by mouth daily.     No current facility-administered medications for this visit.    Past Medical History  Diagnosis Date  . Asthma   . Hypertension   . Coronary artery disease     CABG 03/29/08  . Aortic stenosis 03/29/2008    biological prosthetic replacement  . Dyslipidemia   .  AAA (abdominal aortic aneurysm) 01/03/13    3.5x3.3cm  . LBBB (left bundle branch block)   . PONV (postoperative nausea and vomiting)   . Hypothyroidism   . Heart murmur   . Alzheimer's dementia     [name] 08/17/2013  . MVP (mitral valve prolapse)     with mild mitral insufficiency/notes 08/17/2013  . Exertional shortness of breath   . Anemia   . Migraines     "none in years' (08/17/2013)  . Stroke 03/2008    "2 light strokes" (08/17/2013)  . Arthritis     "joints" (08/17/2013)  . Sinus pause 10/31/2014  . Mobitz type 2 second degree AV block 10/31/2014    Past Surgical History  Procedure Laterality Date  . Coronary artery bypass graft  03/29/2008    LIMA TO LAD,SVG TO CX,SVG TO LEFT POSTEROLATERAL  BRANCH  . Aortic valve replacement  03/29/2008    PERICARDIAL TISSUE VALVE  . Uterine fibroid surgery  1960's  . Retinal detachment surgery Left 1994    [name] 04/10/2008 (08/17/2013)  . Cataract extraction w/ intraocular lens implant Left   . Cardiac valve replacement    . Abdominal hysterectomy  1977  . Appendectomy  ?1977  . Hip fracture surgery Right 06/2008    "3 metal screws" (08/17/2013)  . Refractive surgery Bilateral     [name] 04/28/2008 (08/17/2013)  . Cardiac catheterization  ~ 2009  . Loop recorder implant N/A 09/17/2013    Procedure: LOOP RECORDER IMPLANT;  Surgeon: [name], MD;  Location: [facility];  Service: Cardiovascular;  Laterality: N/A;    Family History  Problem Relation Age of Onset  . Heart failure Mother   . Diabetes Mother   . Hypertension Mother   . Hyperlipidemia Mother     History   Social History  . Marital Status: Divorced    Spouse Name: N/A    Number of Children: N/A  . Years of Education: N/A   Occupational History  . Not on file.   Social History Main Topics  . Smoking status: Never Smoker   . Smokeless tobacco: Never Used  . Alcohol Use: No  . Drug Use: No  . Sexual Activity: No   Other Topics Concern  . Not on file   Social History Narrative    Review of systems: The patient specifically denies any chest pain at rest or with exertion, dyspnea at rest or with exertion, orthopnea, paroxysmal nocturnal dyspnea, syncope, palpitations, focal neurological deficits, intermittent claudication, lower extremity edema, unexplained weight gain, cough, hemoptysis or wheezing.  The patient also denies abdominal pain, nausea, vomiting, dysphagia, diarrhea, constipation, polyuria, polydipsia, dysuria, hematuria, frequency, urgency, abnormal bleeding or bruising, fever, chills, unexpected weight changes, mood swings, change in skin or hair texture, change in voice quality, auditory or visual problems, allergic reactions or rashes, new  musculoskeletal complaints other than usual "aches and pains".   PHYSICAL EXAM BP 138/70 mmHg  Pulse 73  Ht 5\' 9"  (1.753 m)  Wt 156 lb 3.2 oz (70.852 kg)  BMI 23.06 kg/m2 General: Alert, oriented x3, no distress Head: no evidence of trauma, PERRL, EOMI, no exophtalmos or lid lag, no myxedema, no xanthelasma; normal ears, nose and oropharynx Neck: normal jugular venous pulsations and no hepatojugular reflux; brisk carotid pulses without delay and no carotid bruits Chest: clear to auscultation, no signs of consolidation by percussion or palpation, normal fremitus, symmetrical and full respiratory excursions Cardiovascular: normal position and quality of the apical impulse, regular rhythm, normal first  and second heart sounds, no murmurs, rubs or gallops; healthy sternotomy scar and healthy loop recorder site Abdomen: no tenderness or distention, no masses by palpation, no abnormal pulsatility or arterial bruits, normal bowel sounds, no hepatosplenomegaly Extremities: no clubbing, cyanosis or edema; 2+ radial, ulnar and brachial pulses bilaterally; 2+ right femoral, posterior tibial and dorsalis pedis pulses; 2+ left femoral, posterior tibial and dorsalis pedis pulses; no subclavian or femoral bruits Neurological: grossly nonfocal  EKG: NSR, RBBB, LAFB  Lipid Panel     Component Value Date/Time   CHOL  04/10/2008 0500    94        ATP III CLASSIFICATION:  <200     mg/dL   Desirable  161-096  mg/dL   Borderline High  >=045    mg/dL   High   TRIG 77 40/98/1191 0500   HDL 26* 04/10/2008 0500   CHOLHDL 3.6 04/10/2008 0500   VLDL 15 04/10/2008 0500   LDLCALC  04/10/2008 0500    53        Total Cholesterol/HDL:CHD Risk Coronary Heart Disease Risk Table                     Men   Women  1/2 Average Risk   3.4   3.3    BMET    Component Value Date/Time   NA 137 10/29/2014 1048   K 3.5 10/29/2014 1048   CL 95* 10/29/2014 1048   CO2 30 10/29/2014 1048   GLUCOSE 130* 10/29/2014 1048     BUN 12 10/29/2014 1048   CREATININE 0.97 10/29/2014 1048   CREATININE 1.13* 08/18/2013 0410   CALCIUM 8.7 10/29/2014 1048   GFRNONAA 46* 08/18/2013 0410   GFRAA 53* 08/18/2013 0410     ASSESSMENT AND PLAN  Second degree Mobitz type 2 AV block and history of syncope  The mechanism appears to be clearly infrahisian (wide QRS, high grade AV block) and she has attributable symptoms . She should undergo implantation of a dual chamber permanent transvenous pacemaker. A thorough review of the procedure, risks/benefits/complications was presented and she agrees to proceed. ILR will be removed in same setting.  Orders Placed This Encounter  Procedures  . APTT  . CBC  . Comprehensive metabolic panel  . Protime-INR  . Implantable device check  . EKG 12-Lead  . PERMANENT PACEMAKER INSERTION  . LOOP RECORDER EXPLANT   Meds ordered this encounter  Medications  . atorvastatin (LIPITOR) 40 MG tablet    Sig: Take 1 tablet by mouth daily.    Refill:  0  . donepezil (ARICEPT) 10 MG tablet    Sig: Take 10 mg by mouth at bedtime.  . memantine (NAMENDA) 10 MG tablet    Sig: Take 10 mg by mouth 2 (two) times daily.    Junious Silk, MD, [facility] [phone] office [phone] pager

## 2014-11-05 NOTE — Discharge Instructions (Signed)

## 2014-11-06 ENCOUNTER — Ambulatory Visit (HOSPITAL_COMMUNITY): Payer: Medicare Other

## 2014-11-06 DIAGNOSIS — Z7982 Long term (current) use of aspirin: Secondary | ICD-10-CM | POA: Diagnosis not present

## 2014-11-06 DIAGNOSIS — R55 Syncope and collapse: Secondary | ICD-10-CM

## 2014-11-06 DIAGNOSIS — I2510 Atherosclerotic heart disease of native coronary artery without angina pectoris: Secondary | ICD-10-CM | POA: Diagnosis not present

## 2014-11-06 DIAGNOSIS — I441 Atrioventricular block, second degree: Secondary | ICD-10-CM | POA: Diagnosis not present

## 2014-11-06 NOTE — Progress Notes (Signed)
Subjective: No SOB, dizziness  Objective: Vital signs in last 24 hours: Temp:  [97.5 F (36.4 C)-98.2 F (36.8 C)] 98.2 F (36.8 C) (01/13 0500) Pulse Rate:  [56-85] 64 (01/13 0500) Resp:  [15-18] 16 (01/12 1942) BP: (110-150)/(36-59) 122/55 mmHg (01/13 0500) SpO2:  [95 %-100 %] 95 % (01/13 0500) Weight:  [155 lb (70.308 kg)] 155 lb (70.308 kg) (01/12 1136) Last BM Date: 11/04/14  Intake/Output from previous day: 01/12 0701 - 01/13 0700 In: -  Out: 1 [Urine:1] Intake/Output this shift:    Medications Current Facility-Administered Medications  Medication Dose Route Frequency Provider Last Rate Last Dose  . acetaminophen (TYLENOL) tablet 325-650 mg  325-650 mg Oral Q4H PRN Mihai Croitoru, MD      . albuterol (PROVENTIL) (2.5 MG/3ML) 0.083% nebulizer solution 3 mL  3 mL Inhalation Q4H PRN Mihai Croitoru, MD      . aspirin chewable tablet 162 mg  162 mg Oral q morning - 10a Mihai Croitoru, MD      . brinzolamide (AZOPT) 1 % ophthalmic suspension 1 drop  1 drop Left Eye BID [name], MD   1 drop at 11/05/14 2157  . ceFAZolin (ANCEF) IVPB 1 g/50 mL premix  1 g Intravenous Q6H Mihai Croitoru, MD   1 g at 11/06/14 0733  . donepezil (ARICEPT) tablet 10 mg  10 mg Oral QHS Mihai Croitoru, MD   10 mg at 11/05/14 2153  . fluticasone (FLOVENT HFA) 110 MCG/ACT inhaler 1 puff  1 puff Inhalation BID PRN Mihai Croitoru, MD      . latanoprost (XALATAN) 0.005 % ophthalmic solution 1 drop  1 drop Left Eye QHS Mihai Croitoru, MD   1 drop at 11/05/14 2157  . levothyroxine (SYNTHROID, LEVOTHROID) tablet 50 mcg  50 mcg Oral QAC breakfast [name], MD   50 mcg at 11/06/14 0732  . losartan (COZAAR) tablet 100 mg  100 mg Oral q morning - 10a Mihai Croitoru, MD      . memantine (NAMENDA) tablet 10 mg  10 mg Oral BID [name], MD   10 mg at 11/05/14 2154  . mometasone-formoterol (DULERA) 100-5 MCG/ACT inhaler 2 puff  2 puff Inhalation BID [name], MD   2 puff at 11/05/14 2140    . ondansetron (ZOFRAN) injection 4 mg  4 mg Intravenous Q6H PRN Mihai Croitoru, MD      . pantoprazole (PROTONIX) EC tablet 40 mg  40 mg Oral Daily Mihai Croitoru, MD      . simvastatin (ZOCOR) tablet 40 mg  40 mg Oral QPM Mihai Croitoru, MD   40 mg at 11/05/14 2153  . triamterene-hydrochlorothiazide (MAXZIDE-25) 37.5-25 MG per tablet 1 tablet  1 tablet Oral q morning - 10a Mihai Croitoru, MD        PE: General appearance: alert, cooperative and no distress Lungs: clear to auscultation bilaterally Heart: regular rate and rhythm Extremities: No LEE Pulses: 2+ and symmetric Skin: Warm and dry.   no edema at the pacer site.   Neurologic: Grossly normal   Assessment/Plan    Near syncope   Mobitz type 2 second degree AV block  Plan: SP Implantation of new dual chamber permanent pacemaker.  CXR pending.  BP and HR stable.   Discussed limiting arm movement.   DC home today after CXR.   Wound check in 7-10 days.    LOS: 1 day    HAGER, BRYAN PA-C 11/06/2014 7:37 AM  Personally seen and examined. Agree with above.  Pacer CXR normal OK for DC.  Donato Schultz, MD

## 2014-11-06 NOTE — Discharge Summary (Signed)
Physician Discharge Summary     Cardiologist:  Croitoru Patient ID: [name] MRN: [id number] DOB/AGE: Jul 20, 1934 81 y.o.  Admit date: 11/05/2014 Discharge date: 11/06/2014  Admission Diagnoses:   Mobitz type 2 second degree AV block  Discharge Diagnoses:  Active Problems:   Near syncope   Mobitz type 2 second degree AV block    Discharged Condition: stable  Hospital Course:   81 yo female with loop recorder who has recently shown 14 significant pauses, all due to second degree AV block, longest 9.5 seconds in duration (6 consecutive non conducted P waves). Many occurred during the early morning, but some while awake. The device was implanted for recurrent near syncope and at least one full blown syncope event in November 2014. She has a long-standing history of cardiac illness and previously underwent aortic valve replacement with biological prosthesis and multivessel coronary bypass surgery. Normal LV function and AV prosthesis function by echo 12/2013.  She presented for and underwent dual chamber PPM placement(device info below).  The procedure was completed successfully.  CXR showed no pneumothorax.  The patient was seen by Dr. Anne Fu who felt she was stable for DC home.   Device details: Generator Medtronic Adapta model ADDRL1 serial number [id number] H Right atrial lead Medtronic [id number] serial number [id number] Right ventricular lead Medtronic [id number] serial number [id number]   Consults: None  Significant Diagnostic Studies:  Procedure report  Procedure performed:  1. Implantation of new dual chamber permanent pacemaker 2. Fluoroscopy 3. Light sedation 4. Loop recorder explantation   Reason for procedure: Syncope due to: Second degree atrioventricular block Mobitz type II, high grade  Procedure performed by: [name], MD  Complications: None  Estimated blood loss: <10 mL  Medications administered during procedure: Ancef g  intravenously Lidocaine 1% 30 mL locally,  Fentanyl 75 mcg intravenously Versed 4 mg intravenously  Device details: Generator Medtronic Adapta model ADDRL1 serial number [id number] Right atrial lead Medtronic [id number] serial number [id number] Right ventricular lead Medtronic [id number] serial number [id number]  Procedure details:  After the risks and benefits of the procedure were discussed the patient provided informed consent and was brought to the cardiac cath lab in the fasting state. The patient was prepped and draped in usual sterile fashion. Local anesthesia with 1% lidocaine was administered to to the left infraclavicular area. A 5-6 cm horizontal incision was made parallel with and 2-3 cm caudal to the left clavicle. Using electrocautery and blunt dissection a prepectoral pocket was created down to the level of the pectoralis major muscle fascia. The pocket was carefully inspected for hemostasis. An antibiotic-soaked sponge was placed in the pocket.  Under fluoroscopic guidance and using the modified Seldinger technique 2 separate venipunctures were performed to access the left subclavian vein. No difficulty was encountered accessing the vein. Two J-tip guidewires were subsequently exchanged for two 7 French safe sheaths.  Under fluoroscopic guidance the ventricular lead was advanced to level of the mid to apical right ventricular septum and thet active-fixation helix was deployed. Prominent current of injury was seen. Satisfactory pacing and sensing parameters were recorded. There was no evidence of diaphragmatic stimulation at maximum device output. The safe sheath was peeled away and the lead was secured in place with 2-0 silk.  In similar fashion the right atrial lead was advanced to the level of the atrial appendage. The active-fixation helix was deployed. There was prominent current of injury. Satisfactory pacing and sensing parameters were recorded. There was no evidence of  diaphragmatic stimulation with pacing  at maximum device output. The safe sheath was peeled away and the lead was secured in place with 2-0 silk.  The antibiotic-soaked sponge was removed from the pocket. The pocket was flushed with copious amounts of antibiotic solution. Reinspection showed excellent hemostasis..  The ventricular lead was connected to the generator and appropriate ventricular pacing was seen. Subsequently the atrial lead was also connected. Repeat testing of the lead parameters later showed excellent values.  The entire system was then carefully inserted in the pocket with care been taking that the leads and device assumed a comfortable position without pressure on the incision. Great care was taken that the leads be located deep to the generator. The pocket was then closed in layers using 2 layers of 2-0 Vicryl and cutaneous staples, after which a sterile dressing was applied.  At the end of the procedure the following lead parameters were encountered:  Right atrial lead  sensed P waves 3.3 mV, impedance 753 ohms, threshold 0.8 V at 0.5 ms pulse width.  Right ventricular lead sensed R waves 7.8 mV, impedance 999 ohms, threshold 0.6 V at 0.5 ms pulse width.  Subsequently, the neighboring loop recorder site (which was included in the surgical area) was anesthetized with 1% lidocaine. A 1 cm incision was made and the loop recorder was easily dissected out and removed. A couple of 2-0 Vicryl stitches and 3 cutaneous staples were placed, after which a sterile dressing was applied.   [name], MD, [facility]  Treatments:  See above  Discharge Exam: Blood pressure 122/55, pulse 64, temperature 98.2 F (36.8 C), temperature source Oral, resp. rate 16, height  (1.753 m), weight 155 lb (70.308 kg), SpO2 97 %.   Disposition: 01-Home or Self Care      Discharge Instructions    Call MD for:  redness, tenderness, or signs of infection (pain, swelling, redness, odor or  green/yellow discharge around incision site)    Complete by:  As directed      Diet - low sodium heart healthy    Complete by:  As directed      Increase activity slowly    Complete by:  As directed             Medication List    STOP taking these medications        atorvastatin 40 MG tablet  Commonly known as:  LIPITOR      TAKE these medications        albuterol 108 (90 BASE) MCG/ACT inhaler  Commonly known as:  PROVENTIL HFA;VENTOLIN HFA  Inhale 2 puffs into the lungs every 4 (four) hours as needed (for congestion or cough).     alendronate 70 MG tablet  Commonly known as:  FOSAMAX  Take 70 mg by mouth once a week. On Sundays. Take with a full glass of water on an empty stomach.     aspirin 81 MG chewable tablet  Chew 162 mg by mouth every morning.     betamethasone valerate lotion 0.1 %  Commonly known as:  VALISONE  Apply 1 application topically 2 (two) times daily as needed (for dry itchy ears).     brinzolamide 1 % ophthalmic suspension  Commonly known as:  AZOPT  Place 1 drop into the left eye 2 (two) times daily.     CENTRUM SILVER PO  Take 1 tablet by mouth daily.     donepezil 10 MG tablet  Commonly known as:  ARICEPT  Take 10 mg by mouth  at bedtime.     fluticasone 110 MCG/ACT inhaler  Commonly known as:  FLOVENT HFA  Inhale 1 puff into the lungs 2 (two) times daily as needed (for wheezing).     Fluticasone-Salmeterol 250-50 MCG/DOSE Aepb  Commonly known as:  ADVAIR  Inhale 1 puff into the lungs 2 (two) times daily.     latanoprost 0.005 % ophthalmic solution  Commonly known as:  XALATAN  Place 1 drop into the left eye at bedtime.     levothyroxine 50 MCG tablet  Commonly known as:  SYNTHROID, LEVOTHROID  Take 50 mcg by mouth daily before breakfast.     losartan 100 MG tablet  Commonly known as:  COZAAR  Take 100 mg by mouth every morning.     memantine 10 MG tablet  Commonly known as:  NAMENDA  Take 10 mg by mouth 2 (two) times daily.      multivitamin with minerals Tabs tablet  Take 1 tablet by mouth daily.     omeprazole 20 MG capsule  Commonly known as:  PRILOSEC  Take 20 mg by mouth 2 (two) times daily.     simvastatin 40 MG tablet  Commonly known as:  ZOCOR  Take 40 mg by mouth every evening.     tolterodine 2 MG tablet  Commonly known as:  DETROL  Take 2 mg by mouth at bedtime.     triamterene-hydrochlorothiazide 37.5-25 MG per tablet  Commonly known as:  MAXZIDE-25  Take 1 tablet by mouth every morning.       Follow-up Information    Follow up with Wound Check On 11/15/2014.   Why:  10:00 AM   Contact information:   [address] [place] [address] [phone]      Follow up with [name], MD On 12/19/2014.   Specialty:  Cardiology   Why:  10:15 AM   Contact information:   [address] [place] [address] [phone]      Greater than 30 minutes was spent completing the patient's discharge.   SignedWilburt Finlay, PAC 11/06/2014, 10:15 AM

## 2014-11-12 ENCOUNTER — Encounter: Payer: Self-pay | Admitting: Cardiovascular Disease

## 2014-11-15 ENCOUNTER — Ambulatory Visit: Payer: Medicare Other

## 2014-11-18 ENCOUNTER — Ambulatory Visit (INDEPENDENT_AMBULATORY_CARE_PROVIDER_SITE_OTHER): Payer: Medicare Other | Admitting: *Deleted

## 2014-11-18 DIAGNOSIS — R001 Bradycardia, unspecified: Secondary | ICD-10-CM | POA: Diagnosis not present

## 2014-11-18 LAB — MDC_IDC_ENUM_SESS_TYPE_INCLINIC
Battery Impedance: 100 Ohm
Battery Remaining Longevity: 158 mo
Brady Statistic AP VP Percent: 0 %
Brady Statistic AP VS Percent: 25 %
Brady Statistic AS VP Percent: 0 %
Brady Statistic AS VS Percent: 75 %
Date Time Interrogation Session: 20160125125929
Lead Channel Sensing Intrinsic Amplitude: 4 mV
Lead Channel Sensing Intrinsic Amplitude: 8 mV
Lead Channel Setting Pacing Amplitude: 3.5 V
Lead Channel Setting Pacing Amplitude: 3.5 V
MDC IDC MSMT BATTERY VOLTAGE: 2.79 V
MDC IDC MSMT LEADCHNL RA IMPEDANCE VALUE: 576 Ohm
MDC IDC MSMT LEADCHNL RA PACING THRESHOLD AMPLITUDE: 0.5 V
MDC IDC MSMT LEADCHNL RA PACING THRESHOLD PULSEWIDTH: 0.4 ms
MDC IDC MSMT LEADCHNL RV IMPEDANCE VALUE: 656 Ohm
MDC IDC MSMT LEADCHNL RV PACING THRESHOLD AMPLITUDE: 0.75 V
MDC IDC MSMT LEADCHNL RV PACING THRESHOLD PULSEWIDTH: 0.4 ms
MDC IDC SET LEADCHNL RV PACING PULSEWIDTH: 0.4 ms
MDC IDC SET LEADCHNL RV SENSING SENSITIVITY: 2.8 mV

## 2014-11-18 NOTE — Progress Notes (Signed)
Wound check appointment. Staples removed. Wound without redness or edema. Incision edges approximated, wound well healed. Normal device function. Thresholds, sensing, and impedances consistent with implant measurements. Device programmed at 3.5V for extra safety margin until 3 month visit. Histogram distribution appropriate for patient and level of activity. 4 mode switches (<0.1%)---all <30 sec, no EGMs. 1 high ventricular rates noted x 3 sec @ 128/151---AT. Patient educated about wound care, arm mobility, lifting restrictions. ROV w/[facility] on 2/25 @ 10:15am.

## 2014-11-21 ENCOUNTER — Encounter: Payer: Self-pay | Admitting: Gastroenterology

## 2014-11-25 ENCOUNTER — Encounter: Payer: Self-pay | Admitting: Gastroenterology

## 2014-11-25 ENCOUNTER — Telehealth: Payer: Self-pay | Admitting: Cardiovascular Disease

## 2014-11-25 NOTE — Telephone Encounter (Signed)
Communicated information to [place]. She states she will verbally tell the oral surgeon of the OK from cardiology for procedure. She does not need clearance in writing at this time.

## 2014-11-25 NOTE — Telephone Encounter (Signed)
Pt tooth broke off,she had to go to oral surgeon.She needs a letter from Dr C stating that it is all right for her to have the oral surgery.

## 2014-11-25 NOTE — Telephone Encounter (Signed)
Please call,question about a pacemaker card she received in the mail,she also has another card.

## 2014-11-25 NOTE — Telephone Encounter (Signed)
No cardiac restrictions for the planned surgery. Amoxicillin is fine.

## 2014-11-25 NOTE — Telephone Encounter (Signed)
Message routed to Dr. Royann Shivers and Britta Mccreedy to review and advise on oral surgery with Dr. Retta Mac.   Patient had a crown that broke off and dentist thinks she should just have tooth removed. Consult with oral surgeon Dr. Retta Mac is 2/18.   Her regular dentist prescribed amoxicillin for pre-meds for procedure.  Daughter thinks that the extraction will be done under local anesthesia.

## 2014-11-26 ENCOUNTER — Encounter: Payer: Self-pay | Admitting: Cardiovascular Disease

## 2014-11-26 NOTE — Telephone Encounter (Signed)
Spoke to daughter about card pt received in the mail. Daughter states that she will go to the patient's home later today and discard the patient's old card from her ILR implant. I encouraged her to call back if she has any further questions. Daughter voiced understanding.

## 2014-12-19 ENCOUNTER — Encounter: Payer: Self-pay | Admitting: Cardiovascular Disease

## 2014-12-19 ENCOUNTER — Ambulatory Visit (INDEPENDENT_AMBULATORY_CARE_PROVIDER_SITE_OTHER): Payer: Medicare Other | Admitting: Cardiovascular Disease

## 2014-12-19 VITALS — BP 128/64 | HR 76 | Ht 68.5 in | Wt 156.8 lb

## 2014-12-19 DIAGNOSIS — I714 Abdominal aortic aneurysm, without rupture, unspecified: Secondary | ICD-10-CM

## 2014-12-19 DIAGNOSIS — R001 Bradycardia, unspecified: Secondary | ICD-10-CM

## 2014-12-19 DIAGNOSIS — I441 Atrioventricular block, second degree: Secondary | ICD-10-CM

## 2014-12-19 DIAGNOSIS — I10 Essential (primary) hypertension: Secondary | ICD-10-CM

## 2014-12-19 DIAGNOSIS — Z954 Presence of other heart-valve replacement: Secondary | ICD-10-CM

## 2014-12-19 DIAGNOSIS — Z952 Presence of prosthetic heart valve: Secondary | ICD-10-CM

## 2014-12-19 DIAGNOSIS — I2510 Atherosclerotic heart disease of native coronary artery without angina pectoris: Secondary | ICD-10-CM

## 2014-12-19 DIAGNOSIS — E785 Hyperlipidemia, unspecified: Secondary | ICD-10-CM

## 2014-12-19 NOTE — Patient Instructions (Signed)
Remote monitoring is used to monitor your Pacemaker or ICD from home. This monitoring reduces the number of office visits required to check your device to one time per year. It allows [place] to monitor the functioning of your device to ensure it is working properly. You are scheduled for a device check from home on Mar 20, 2015. You may send your transmission at any time that day. If you have a wireless device, the transmission will be sent automatically. After your physician reviews your transmission, you will receive a postcard with your next transmission date.  Dr. Royann Shivers recommends that you schedule a follow-up appointment in: One Year.

## 2014-12-21 NOTE — Progress Notes (Signed)
Patient ID: [name], female   DOB: [birth year], 81 y.o.   MRN: [id number]     Reason for office visit Second degree AV block with >9" pauses  On November 05, 2014 Mrs. Face underwent dual chamber pacemaker (Medtronic Adapta) implantation for second degree AV block, with pauses up to 9.5 seconds in duration (6 consecutive non conducted P waves). The device was implanted for recurrent near syncope and at least one full blown syncope event in November 2014. She has a long-standing history of cardiac illness and previously underwent aortic valve replacement with biological prosthesis and multivessel coronary bypass surgery. Normal LV function and AV prosthesis function by echo 12/2013. She has a small AAA (3.5 cm).  Pacemaker check shows normal function with good lead parameters. There is 25% atrial pacing and 0.2% ventricular pacing. A single 4" episode of atrial tachycardia with 1:1 conduction has been recorded. The surgical site is well healed.   Allergies  Allergen Reactions  . Codeine Nausea And Vomiting and Other (See Comments)    Patient gets violently ill  . Morphine And Related Nausea And Vomiting and Other (See Comments)    Patient get violently ill    Current Outpatient Prescriptions  Medication Sig Dispense Refill  . albuterol (PROVENTIL HFA;VENTOLIN HFA) 108 (90 BASE) MCG/ACT inhaler Inhale 2 puffs into the lungs every 4 (four) hours as needed (for congestion or cough).     Marland Kitchen alendronate (FOSAMAX) 70 MG tablet Take 70 mg by mouth once a week. On Sundays. Take with a full glass of water on an empty stomach.    Marland Kitchen aspirin 81 MG chewable tablet Chew 162 mg by mouth every morning.     . betamethasone valerate lotion (VALISONE) 0.1 % Apply 1 application topically 2 (two) times daily as needed (for dry itchy ears).    . brinzolamide (AZOPT) 1 % ophthalmic suspension Place 1 drop into the left eye 2 (two) times daily.    Marland Kitchen donepezil (ARICEPT) 10 MG tablet Take 10 mg by mouth at  bedtime.    . fluticasone (FLOVENT HFA) 110 MCG/ACT inhaler Inhale 1 puff into the lungs 2 (two) times daily as needed (for wheezing).     . Fluticasone-Salmeterol (ADVAIR) 250-50 MCG/DOSE AEPB Inhale 1 puff into the lungs 2 (two) times daily.    Marland Kitchen latanoprost (XALATAN) 0.005 % ophthalmic solution Place 1 drop into the left eye at bedtime.    Marland Kitchen levothyroxine (SYNTHROID, LEVOTHROID) 50 MCG tablet Take 50 mcg by mouth daily before breakfast.    . losartan (COZAAR) 100 MG tablet Take 100 mg by mouth every morning.     . memantine (NAMENDA) 10 MG tablet Take 10 mg by mouth 2 (two) times daily.    . Multiple Vitamin (MULTIVITAMIN WITH MINERALS) TABS tablet Take 1 tablet by mouth daily.    . Multiple Vitamins-Minerals (CENTRUM SILVER PO) Take 1 tablet by mouth daily.    Marland Kitchen omeprazole (PRILOSEC) 20 MG capsule Take 20 mg by mouth 2 (two) times daily.     . simvastatin (ZOCOR) 40 MG tablet Take 40 mg by mouth every evening.    . tolterodine (DETROL) 2 MG tablet Take 2 mg by mouth at bedtime.    . triamterene-hydrochlorothiazide (MAXZIDE-25) 37.5-25 MG per tablet Take 1 tablet by mouth every morning.      No current facility-administered medications for this visit.    Past Medical History  Diagnosis Date  . Asthma   . Hypertension   . Coronary artery disease  CABG 03/29/08  . Aortic stenosis 03/29/2008    biological prosthetic replacement  . Dyslipidemia   . AAA (abdominal aortic aneurysm) 01/03/13    3.5x3.3cm  . LBBB (left bundle branch block)   . PONV (postoperative nausea and vomiting)   . Hypothyroidism   . Heart murmur   . MVP (mitral valve prolapse)     with mild mitral insufficiency/notes 08/17/2013  . Exertional shortness of breath   . Sinus pause 10/31/2014  . Mobitz type 2 second degree AV block 10/31/2014  . Presence of permanent cardiac pacemaker   . Family history of adverse reaction to anesthesia     "daughter gets PONV too"  . Chronic asthmatic bronchitis     "q fall and  winter" (11/05/2014)  . Anemia     "hx of chronic" (11/05/2014)  . Migraines     "none in years' (11/05/2014)  . Stroke 03/2008    "2 light strokes"; denies residual on 11/05/2014  . Arthritis     "joints" (11/05/2014)  . Urinary frequency   . Alzheimer's dementia     "probably middle stage" (11/05/2014)    Past Surgical History  Procedure Laterality Date  . Coronary artery bypass graft  03/29/2008    LIMA TO LAD,SVG TO CX,SVG TO LEFT POSTEROLATERAL BRANCH  . Aortic valve replacement  03/29/2008    PERICARDIAL TISSUE VALVE  . Uterine fibroid surgery  1960's  . Retinal detachment surgery Left 1994    [name] 04/10/2008 (08/17/2013)  . Cataract extraction w/ intraocular lens implant Left   . Cardiac valve replacement    . Abdominal hysterectomy  1977  . Appendectomy  ?1977  . Hip fracture surgery Right 06/2010    "3 metal screws"   . Refractive surgery Bilateral     [name] 04/28/2008 (08/17/2013)  . Cardiac catheterization  ~ 2009  . Loop recorder implant N/A 09/17/2013    Procedure: LOOP RECORDER IMPLANT;  Surgeon: [name], MD;  Location: [facility]ATH [facility];  Service: Cardiovascular;  Laterality: N/A;  . Insert / replace / remove pacemaker  11/05/2014  . Loop recorder explant  11/05/2014  . Loop recorder explant N/A 11/05/2014    Procedure: LOOP RECORDER EXPLANT;  Surgeon: [name], MD;  Location: [facility] CATH [facility];  Service: Cardiovascular;  Laterality: N/A;  . Permanent pacemaker insertion N/A 11/05/2014    Procedure: PERMANENT PACEMAKER INSERTION;  Surgeon: [name], MD;  Location: [facility] CATH [facility];  Service: Cardiovascular;  Laterality: N/A;    Family History  Problem Relation Age of Onset  . Heart failure Mother   . Diabetes Mother   . Hypertension Mother   . Hyperlipidemia Mother     History   Social History  . Marital Status: Divorced    Spouse Name: N/A  . Number of Children: N/A  . Years of Education: N/A   Occupational History  . Not on file.   Social History Main  Topics  . Smoking status: Never Smoker   . Smokeless tobacco: Never Used  . Alcohol Use: No  . Drug Use: No  . Sexual Activity: No   Other Topics Concern  . Not on file   Social History Narrative    Review of systems: The patient specifically denies any chest pain at rest or with exertion, dyspnea at rest or with exertion, orthopnea, paroxysmal nocturnal dyspnea, syncope, palpitations, focal neurological deficits, intermittent claudication, lower extremity edema, unexplained weight gain, cough, hemoptysis or wheezing.  The patient also denies abdominal pain, nausea, vomiting, dysphagia,  diarrhea, constipation, polyuria, polydipsia, dysuria, hematuria, frequency, urgency, abnormal bleeding or bruising, fever, chills, unexpected weight changes, mood swings, change in skin or hair texture, change in voice quality, auditory or visual problems, allergic reactions or rashes, new musculoskeletal complaints other than usual "aches and pains".   PHYSICAL EXAM BP 128/64 mmHg  Pulse 76  Ht 5' 8.5" (1.74 m)  Wt 156 lb 12.8 oz (71.124 kg)  BMI 23.49 kg/m2 General: Alert, oriented x3, no distress Head: no evidence of trauma, PERRL, EOMI, no exophtalmos or lid lag, no myxedema, no xanthelasma; normal ears, nose and oropharynx Neck: normal jugular venous pulsations and no hepatojugular reflux; brisk carotid pulses without delay and no carotid bruits Chest: clear to auscultation, no signs of consolidation by percussion or palpation, normal fremitus, symmetrical and full respiratory excursions Cardiovascular: normal position and quality of the apical impulse, regular rhythm, normal first and second heart sounds, no murmurs, rubs or gallops; healthy sternotomy scar and healthy pacemaker site Abdomen: no tenderness or distention, no masses by palpation, no abnormal pulsatility or arterial bruits, normal bowel sounds, no hepatosplenomegaly Extremities: no clubbing, cyanosis or edema; 2+ radial, ulnar and  brachial pulses bilaterally; 2+ right femoral, posterior tibial and dorsalis pedis pulses; 2+ left femoral, posterior tibial and dorsalis pedis pulses; no subclavian or femoral bruits Neurological: grossly nonfocal   BMET    Component Value Date/Time   NA 137 10/29/2014 1048   K 3.5 10/29/2014 1048   CL 95* 10/29/2014 1048   CO2 30 10/29/2014 1048   GLUCOSE 130* 10/29/2014 1048   BUN 12 10/29/2014 1048   CREATININE 0.97 10/29/2014 1048   CREATININE 1.13* 08/18/2013 0410   CALCIUM 8.7 10/29/2014 1048   GFRNONAA 46* 08/18/2013 0410   GFRAA 53* 08/18/2013 0410     ASSESSMENT AND PLAN CAD (coronary artery disease) s/p CABG - 2009 (LIMA to LAD, SVG to LCX, SVG to L-PLV Currently asymptomatic.  S/P AVR 2009 [facility] Ease 21 mm bovine pericardial Last evaluated by echocardiography in March 2015 with normal valve gradients and no evidence of insufficiency. (Peak 28, mean 17 mm Hg).  AAA Small infrarenal abdominal aortic and as in the maximum diameter of 35 mm, unchanged when 2014 study is compared to 2013. Repeat evaluation scheduled in March 2016.  Intermittent Second degree AV block, Mobitz type II  Dual chamber pacemaker Infrequent V pacing, normal device function  HTN (hypertension) Good control today  Hyperlipidemia To be checked at her visit with Dr. Duaine Dredge. Target LDL<100, preferable <70.   Junious Silk, MD, [facility] [phone] office [phone] pager

## 2014-12-23 LAB — MDC_IDC_ENUM_SESS_TYPE_INCLINIC
Battery Impedance: 100 Ohm
Battery Remaining Longevity: 158 mo
Brady Statistic AP VS Percent: 25 %
Brady Statistic AS VS Percent: 74 %
Date Time Interrogation Session: 20160225151911
Lead Channel Impedance Value: 594 Ohm
Lead Channel Impedance Value: 646 Ohm
Lead Channel Pacing Threshold Amplitude: 0.625 V
Lead Channel Pacing Threshold Amplitude: 0.75 V
Lead Channel Pacing Threshold Pulse Width: 0.4 ms
Lead Channel Sensing Intrinsic Amplitude: 2.8 mV
Lead Channel Setting Pacing Pulse Width: 0.4 ms
MDC IDC MSMT BATTERY VOLTAGE: 2.79 V
MDC IDC MSMT LEADCHNL RV PACING THRESHOLD PULSEWIDTH: 0.4 ms
MDC IDC MSMT LEADCHNL RV SENSING INTR AMPL: 5.6 mV
MDC IDC SET LEADCHNL RA PACING AMPLITUDE: 3.5 V
MDC IDC SET LEADCHNL RV PACING AMPLITUDE: 3.5 V
MDC IDC SET LEADCHNL RV SENSING SENSITIVITY: 2.8 mV
MDC IDC STAT BRADY AP VP PERCENT: 0 %
MDC IDC STAT BRADY AS VP PERCENT: 0 %

## 2015-01-02 ENCOUNTER — Encounter (HOSPITAL_COMMUNITY): Payer: Self-pay | Admitting: *Deleted

## 2015-01-08 ENCOUNTER — Telehealth (HOSPITAL_COMMUNITY): Payer: Self-pay | Admitting: *Deleted

## 2015-01-13 ENCOUNTER — Encounter: Payer: Self-pay | Admitting: Gastroenterology

## 2015-01-13 ENCOUNTER — Ambulatory Visit (INDEPENDENT_AMBULATORY_CARE_PROVIDER_SITE_OTHER): Payer: Medicare Other | Admitting: Gastroenterology

## 2015-01-13 ENCOUNTER — Ambulatory Visit (HOSPITAL_COMMUNITY)
Admission: RE | Admit: 2015-01-13 | Discharge: 2015-01-13 | Disposition: A | Discharge status: Home / Self Care | Payer: Medicare Other | Source: Ambulatory Visit | Attending: Cardiology | Admitting: Cardiology

## 2015-01-13 ENCOUNTER — Encounter: Payer: Self-pay | Admitting: Cardiovascular Disease

## 2015-01-13 VITALS — BP 156/70 | HR 76 | Ht 68.0 in | Wt 158.0 lb

## 2015-01-13 DIAGNOSIS — I729 Aneurysm of unspecified site: Secondary | ICD-10-CM | POA: Insufficient documentation

## 2015-01-13 DIAGNOSIS — I25810 Atherosclerosis of coronary artery bypass graft(s) without angina pectoris: Secondary | ICD-10-CM

## 2015-01-13 DIAGNOSIS — Z952 Presence of prosthetic heart valve: Secondary | ICD-10-CM

## 2015-01-13 DIAGNOSIS — Z1211 Encounter for screening for malignant neoplasm of colon: Secondary | ICD-10-CM

## 2015-01-13 DIAGNOSIS — Z950 Presence of cardiac pacemaker: Secondary | ICD-10-CM

## 2015-01-13 DIAGNOSIS — Z954 Presence of other heart-valve replacement: Secondary | ICD-10-CM

## 2015-01-13 DIAGNOSIS — I714 Abdominal aortic aneurysm, without rupture: Secondary | ICD-10-CM

## 2015-01-13 NOTE — Patient Instructions (Signed)
We have cancelled your Colonoscopy recalls.  Thank you for choosing me and [facility].  Venita Lick. Pleas Koch., MD., Clementeen Graham  cc: Mosetta Putt, MD

## 2015-01-13 NOTE — Progress Notes (Signed)
History of Present Illness: This is 81 year old female who presents for colon cancer screening. She is accompanied by her daughter. She previously underwent colonoscopy in February 2006 which was normal except for a biopsy of a polyp which was polypoid mucosa on biopsy. She is status post CABG and aortic valve replacement in 2009. She is status post pacemaker placement in January 2016. Has a AAA, history of strokes and dementia. Her daughter states that her mother has had stool Hemoccults performed yearly by Dr. Duaine Dredge and they have been negative. Denies weight loss, abdominal pain, constipation, diarrhea, change in stool caliber, melena, hematochezia, nausea, vomiting, dysphagia, reflux symptoms, chest pain.  Allergies  Allergen Reactions  . Codeine Nausea And Vomiting and Other (See Comments)    Patient gets violently ill  . Morphine And Related Nausea And Vomiting and Other (See Comments)    Patient get violently ill   Outpatient Prescriptions Prior to Visit  Medication Sig Dispense Refill  . albuterol (PROVENTIL HFA;VENTOLIN HFA) 108 (90 BASE) MCG/ACT inhaler Inhale 2 puffs into the lungs every 4 (four) hours as needed (for congestion or cough).     Marland Kitchen alendronate (FOSAMAX) 70 MG tablet Take 70 mg by mouth once a week. On Sundays. Take with a full glass of water on an empty stomach.    Marland Kitchen aspirin 81 MG chewable tablet Chew 162 mg by mouth every morning.     . betamethasone valerate lotion (VALISONE) 0.1 % Apply 1 application topically 2 (two) times daily as needed (for dry itchy ears).    . brinzolamide (AZOPT) 1 % ophthalmic suspension Place 1 drop into the left eye 2 (two) times daily.    Marland Kitchen donepezil (ARICEPT) 10 MG tablet Take 10 mg by mouth at bedtime.    . fluticasone (FLOVENT HFA) 110 MCG/ACT inhaler Inhale 1 puff into the lungs 2 (two) times daily as needed (for wheezing).     . Fluticasone-Salmeterol (ADVAIR) 250-50  MCG/DOSE AEPB Inhale 1 puff into the lungs 2 (two) times daily.    Marland Kitchen latanoprost (XALATAN) 0.005 % ophthalmic solution Place 1 drop into the left eye at bedtime.    Marland Kitchen levothyroxine (SYNTHROID, LEVOTHROID) 50 MCG tablet Take 50 mcg by mouth daily before breakfast.    . losartan (COZAAR) 100 MG tablet Take 100 mg by mouth every morning.     . memantine (NAMENDA) 10 MG tablet Take 10 mg by mouth 2 (two) times daily.    . Multiple Vitamin (MULTIVITAMIN WITH MINERALS) TABS tablet Take 1 tablet by mouth daily.    . Multiple Vitamins-Minerals (CENTRUM SILVER PO) Take 1 tablet by mouth daily.    Marland Kitchen omeprazole (PRILOSEC) 20 MG capsule Take 20 mg by mouth 2 (two) times daily.     . simvastatin (ZOCOR) 40 MG tablet Take 40 mg by mouth every evening.    . tolterodine (DETROL) 2 MG tablet Take 2 mg by mouth at bedtime.    . triamterene-hydrochlorothiazide (MAXZIDE-25) 37.5-25 MG per tablet Take 1 tablet by mouth every morning.      No facility-administered medications prior to visit.   Past Medical History  Diagnosis Date  . Asthma   . Hypertension   . Coronary artery disease     CABG 03/29/08  . Aortic stenosis 03/29/2008    biological prosthetic replacement  . Dyslipidemia   . AAA (abdominal aortic aneurysm) 01/03/13    3.5x3.3cm  . LBBB (left bundle branch block)   . PONV (postoperative nausea and vomiting)   . Hypothyroidism   . Heart murmur   . MVP (mitral valve prolapse)     with mild mitral insufficiency/notes 08/17/2013  . Exertional shortness of breath   . Sinus pause 10/31/2014  . Mobitz type 2 second degree AV block 10/31/2014  . Presence of permanent cardiac pacemaker   . Family history of adverse reaction to anesthesia     "daughter gets PONV too"  . Chronic asthmatic bronchitis     "q fall and winter" (11/05/2014)  . Anemia     "hx of chronic" (11/05/2014)  . Migraines     "none in years' (11/05/2014)  . Stroke 03/2008    "2 light strokes"; denies residual on 11/05/2014  . Arthritis       "joints" (11/05/2014)  . Urinary frequency   . Alzheimer's dementia     "probably middle stage" (11/05/2014)   Past Surgical History  Procedure Laterality Date  . Coronary artery bypass graft  03/29/2008    LIMA TO LAD,SVG TO CX,SVG TO LEFT POSTEROLATERAL BRANCH  . Aortic valve replacement  03/29/2008    PERICARDIAL TISSUE VALVE  . Uterine fibroid surgery  1960's  . Retinal detachment surgery Left 1994    [name] 04/10/2008 (08/17/2013)  . Cataract extraction w/ intraocular lens implant Left   . Abdominal hysterectomy  1977  . Appendectomy  ?1977  . Hip fracture surgery Right 06/2010    "3 metal screws"   . Refractive surgery Bilateral     [name] 04/28/2008 (08/17/2013)  . Cardiac catheterization  ~ 2009  . Loop recorder implant N/A 09/17/2013    Procedure: LOOP RECORDER IMPLANT;  Surgeon: [name], MD;  Location: [facility];  Service: Cardiovascular;  Laterality: N/A;  . Insert / replace / remove pacemaker  11/05/2014  . Loop recorder explant  11/05/2014  . Loop recorder explant N/A 11/05/2014    Procedure: LOOP RECORDER EXPLANT;  Surgeon: [name], MD;  Location: [facility];  Service: Cardiovascular;  Laterality: N/A;  . Permanent pacemaker insertion N/A 11/05/2014    Procedure: PERMANENT PACEMAKER INSERTION;  Surgeon: [name], MD;  Location: [facility];  Service: Cardiovascular;  Laterality: N/A;   History   Social History  . Marital Status: Divorced    Spouse Name: N/A  . Number of Children: N/A  . Years of Education: N/A   Social History Main Topics  . Smoking status: Never Smoker   . Smokeless tobacco: Never Used  . Alcohol Use: No  . Drug Use: No  . Sexual Activity: No   Other Topics Concern  . None   Social History Narrative   Family History  Problem Relation Age of Onset  . Heart failure Mother   . Diabetes Mother   . Hypertension Mother   . Hyperlipidemia Mother   . Melanoma Grandchild   . Colon cancer Neg Hx     Review of Systems:  Pertinent positive and negative review of systems were noted in the above HPI section. All other review of systems were otherwise negative.   Physical Exam: General: Well developed, well nourished, no acute distress Head: Normocephalic and atraumatic  Eyes:  sclerae anicteric, EOMI Ears: Normal auditory acuity Mouth: No deformity or lesions Neck: Supple, no masses or thyromegaly Lungs: Clear throughout to auscultation Heart: Regular rate and rhythm; no murmurs, rubs or bruits Abdomen: Soft, non tender and non distended. No masses, hepatosplenomegaly or hernias noted. Normal Bowel sounds Musculoskeletal: Symmetrical with no gross deformities  Skin: No lesions on visible extremities Pulses:  Normal pulses noted Extremities: No clubbing, cyanosis, edema or deformities noted Neurological: Alert oriented x 4, grossly nonfocal Cervical Nodes:  No significant cervical adenopathy Inguinal Nodes: No significant inguinal adenopathy Psychological:  Alert and cooperative. Normal mood and affect  Assessment and Recommendations:  1. CRC screening, average risk. Annual stool testing for occult blood performed by Dr. Geoffery Lyons office reportedly negative. The daughter, the patient and myself had a long discussion about the risks and benefits of colonoscopy given her comorbidities, age and history of negative annual fecal testing. I think it is reasonable to proceed with a screening colonoscopy and it is reasonable to defer screening colonoscopy. Patient prefers to defer screening colonoscopy. If she develops LGI complaints or is found to have occult fecal blood present at he annual CPE will reconsider colonoscopy for diagnostic purposes. She states she has an annual CPE with Dr. Duaine Dredge scheduled in July.

## 2015-01-13 NOTE — Progress Notes (Signed)
Aorta Duplex Completed. Stable AAA with current measurements of 3.3 x 3.5 cm. Marilynne Halsted, BS, RDMS, RVT

## 2015-01-20 ENCOUNTER — Telehealth: Payer: Self-pay | Admitting: *Deleted

## 2015-01-20 DIAGNOSIS — I714 Abdominal aortic aneurysm, without rupture, unspecified: Secondary | ICD-10-CM

## 2015-01-20 NOTE — Telephone Encounter (Signed)
Results of abdominal aortic US called.  Repeat in one year.   Voiced understanding.

## 2015-01-20 NOTE — Telephone Encounter (Signed)
-----   Message from [name], MD sent at 01/17/2015  5:30 PM EDT ----- Small AAA unchanged from last year. Repeat in a year. Copy Dr. Duaine Dredge

## 2015-03-20 ENCOUNTER — Ambulatory Visit (INDEPENDENT_AMBULATORY_CARE_PROVIDER_SITE_OTHER): Payer: Medicare Other | Admitting: *Deleted

## 2015-03-20 ENCOUNTER — Telehealth: Payer: Self-pay | Admitting: Cardiology

## 2015-03-20 DIAGNOSIS — I441 Atrioventricular block, second degree: Secondary | ICD-10-CM

## 2015-03-20 NOTE — Progress Notes (Signed)
Remote pacemaker transmission.   

## 2015-03-20 NOTE — Telephone Encounter (Signed)
LMOVM reminding pt to send remote transmission.   

## 2015-03-28 LAB — CUP PACEART REMOTE DEVICE CHECK
Battery Remaining Longevity: 176 mo
Battery Voltage: 2.79 V
Brady Statistic AP VS Percent: 30 %
Brady Statistic AS VP Percent: 0 %
Brady Statistic AS VS Percent: 70 %
Lead Channel Impedance Value: 548 Ohm
Lead Channel Impedance Value: 630 Ohm
Lead Channel Pacing Threshold Amplitude: 0.625 V
Lead Channel Pacing Threshold Amplitude: 0.625 V
Lead Channel Pacing Threshold Pulse Width: 0.4 ms
Lead Channel Sensing Intrinsic Amplitude: 5.6 mV
Lead Channel Setting Pacing Amplitude: 1.5 V
Lead Channel Setting Pacing Pulse Width: 0.4 ms
MDC IDC MSMT BATTERY IMPEDANCE: 100 Ohm
MDC IDC MSMT LEADCHNL RA PACING THRESHOLD PULSEWIDTH: 0.4 ms
MDC IDC MSMT LEADCHNL RA SENSING INTR AMPL: 2.8 mV
MDC IDC SESS DTM: 20160526165559
MDC IDC SET LEADCHNL RV PACING AMPLITUDE: 2 V
MDC IDC SET LEADCHNL RV SENSING SENSITIVITY: 2.8 mV
MDC IDC STAT BRADY AP VP PERCENT: 0 %

## 2015-04-01 ENCOUNTER — Other Ambulatory Visit: Payer: Self-pay | Admitting: Sports Medicine

## 2015-04-01 DIAGNOSIS — M545 Low back pain: Secondary | ICD-10-CM

## 2015-04-02 ENCOUNTER — Other Ambulatory Visit: Payer: Medicare Other

## 2015-04-04 ENCOUNTER — Ambulatory Visit
Admission: RE | Admit: 2015-04-04 | Discharge: 2015-04-04 | Disposition: A | Discharge status: Home / Self Care | Payer: Medicare Other | Source: Ambulatory Visit | Attending: Sports Medicine | Admitting: Sports Medicine

## 2015-04-04 ENCOUNTER — Encounter: Payer: Self-pay | Admitting: Cardiology

## 2015-04-04 DIAGNOSIS — M545 Low back pain: Secondary | ICD-10-CM

## 2015-04-08 ENCOUNTER — Encounter: Payer: Self-pay | Admitting: Cardiovascular Disease

## 2015-06-12 ENCOUNTER — Ambulatory Visit
Admission: RE | Admit: 2015-06-12 | Discharge: 2015-06-12 | Disposition: A | Discharge status: Home / Self Care | Payer: Medicare Other | Source: Ambulatory Visit | Attending: Family Medicine | Admitting: Family Medicine

## 2015-06-12 ENCOUNTER — Other Ambulatory Visit: Payer: Self-pay | Admitting: Family Medicine

## 2015-06-12 DIAGNOSIS — R053 Chronic cough: Secondary | ICD-10-CM

## 2015-06-12 DIAGNOSIS — M25551 Pain in right hip: Secondary | ICD-10-CM

## 2015-06-12 DIAGNOSIS — R05 Cough: Secondary | ICD-10-CM

## 2015-06-12 DIAGNOSIS — M545 Low back pain: Secondary | ICD-10-CM

## 2015-06-12 LAB — HM MAMMOGRAPHY

## 2015-06-19 ENCOUNTER — Ambulatory Visit (INDEPENDENT_AMBULATORY_CARE_PROVIDER_SITE_OTHER): Payer: Medicare Other | Admitting: *Deleted

## 2015-06-19 DIAGNOSIS — I441 Atrioventricular block, second degree: Secondary | ICD-10-CM

## 2015-06-23 DIAGNOSIS — I441 Atrioventricular block, second degree: Secondary | ICD-10-CM | POA: Diagnosis not present

## 2015-06-25 NOTE — Progress Notes (Signed)
Remote pacemaker transmission.   

## 2015-07-03 LAB — CUP PACEART REMOTE DEVICE CHECK
Battery Impedance: 100 Ohm
Battery Voltage: 2.79 V
Brady Statistic AP VP Percent: 0 %
Brady Statistic AP VS Percent: 28 %
Brady Statistic AS VP Percent: 0 %
Lead Channel Impedance Value: 486 Ohm
Lead Channel Impedance Value: 571 Ohm
Lead Channel Pacing Threshold Amplitude: 0.5 V
Lead Channel Pacing Threshold Pulse Width: 0.4 ms
Lead Channel Sensing Intrinsic Amplitude: 2.8 mV
Lead Channel Setting Pacing Amplitude: 1.5 V
Lead Channel Setting Sensing Sensitivity: 2.8 mV
MDC IDC MSMT BATTERY REMAINING LONGEVITY: 175 mo
MDC IDC MSMT LEADCHNL RV PACING THRESHOLD AMPLITUDE: 0.625 V
MDC IDC MSMT LEADCHNL RV PACING THRESHOLD PULSEWIDTH: 0.4 ms
MDC IDC MSMT LEADCHNL RV SENSING INTR AMPL: 5.6 mV
MDC IDC SESS DTM: 20160829112831
MDC IDC SET LEADCHNL RV PACING AMPLITUDE: 2 V
MDC IDC SET LEADCHNL RV PACING PULSEWIDTH: 0.4 ms
MDC IDC STAT BRADY AS VS PERCENT: 71 %

## 2015-07-11 ENCOUNTER — Encounter: Payer: Self-pay | Admitting: Cardiology

## 2015-07-18 ENCOUNTER — Encounter: Payer: Self-pay | Admitting: Cardiovascular Disease

## 2015-09-22 ENCOUNTER — Ambulatory Visit (INDEPENDENT_AMBULATORY_CARE_PROVIDER_SITE_OTHER): Payer: Medicare Other | Admitting: *Deleted

## 2015-09-22 ENCOUNTER — Telehealth: Payer: Self-pay | Admitting: Cardiology

## 2015-09-22 DIAGNOSIS — I441 Atrioventricular block, second degree: Secondary | ICD-10-CM

## 2015-09-22 NOTE — Telephone Encounter (Signed)
LMOVM reminding pt to send remote transmission.   

## 2015-09-24 NOTE — Progress Notes (Signed)
Remote pacemaker transmission.   

## 2015-10-02 LAB — CUP PACEART REMOTE DEVICE CHECK
Battery Impedance: 100 Ohm
Battery Remaining Longevity: 176 mo
Battery Voltage: 2.78 V
Brady Statistic AP VP Percent: 0 %
Brady Statistic AP VS Percent: 26 %
Brady Statistic AS VP Percent: 0 %
Date Time Interrogation Session: 20161129011751
Implantable Lead Implant Date: 20160112
Implantable Lead Location: 753860
Implantable Lead Model: 5076
Lead Channel Impedance Value: 494 Ohm
Lead Channel Setting Pacing Amplitude: 1.5 V
Lead Channel Setting Pacing Pulse Width: 0.4 ms
Lead Channel Setting Sensing Sensitivity: 2 mV
MDC IDC LEAD IMPLANT DT: 20160112
MDC IDC LEAD LOCATION: 753859
MDC IDC MSMT LEADCHNL RV IMPEDANCE VALUE: 571 Ohm
MDC IDC SET LEADCHNL RV PACING AMPLITUDE: 2 V
MDC IDC STAT BRADY AS VS PERCENT: 73 %

## 2015-10-03 ENCOUNTER — Encounter: Payer: Self-pay | Admitting: Cardiology

## 2016-01-20 ENCOUNTER — Ambulatory Visit (INDEPENDENT_AMBULATORY_CARE_PROVIDER_SITE_OTHER): Payer: Medicare Other | Admitting: Cardiovascular Disease

## 2016-01-20 ENCOUNTER — Encounter: Payer: Self-pay | Admitting: Cardiovascular Disease

## 2016-01-20 VITALS — BP 144/75 | HR 61 | Ht 68.0 in | Wt 146.2 lb

## 2016-01-20 DIAGNOSIS — I2510 Atherosclerotic heart disease of native coronary artery without angina pectoris: Secondary | ICD-10-CM

## 2016-01-20 DIAGNOSIS — I441 Atrioventricular block, second degree: Secondary | ICD-10-CM | POA: Diagnosis not present

## 2016-01-20 DIAGNOSIS — Z952 Presence of prosthetic heart valve: Secondary | ICD-10-CM

## 2016-01-20 DIAGNOSIS — Z954 Presence of other heart-valve replacement: Secondary | ICD-10-CM | POA: Diagnosis not present

## 2016-01-20 DIAGNOSIS — I714 Abdominal aortic aneurysm, without rupture, unspecified: Secondary | ICD-10-CM

## 2016-01-20 DIAGNOSIS — Z950 Presence of cardiac pacemaker: Secondary | ICD-10-CM

## 2016-01-20 DIAGNOSIS — I341 Nonrheumatic mitral (valve) prolapse: Secondary | ICD-10-CM

## 2016-01-20 DIAGNOSIS — I471 Supraventricular tachycardia: Secondary | ICD-10-CM

## 2016-01-20 LAB — CUP PACEART INCLINIC DEVICE CHECK
Battery Impedance: 109 Ohm
Battery Remaining Longevity: 174 mo
Battery Voltage: 2.79 V
Implantable Lead Location: 753860
Implantable Lead Model: 5076
Implantable Lead Model: 5076
Lead Channel Impedance Value: 501 Ohm
Lead Channel Pacing Threshold Amplitude: 0.625 V
Lead Channel Pacing Threshold Pulse Width: 0.4 ms
MDC IDC LEAD IMPLANT DT: 20160112
MDC IDC LEAD IMPLANT DT: 20160112
MDC IDC LEAD LOCATION: 753859
MDC IDC MSMT LEADCHNL RA PACING THRESHOLD AMPLITUDE: 0.5 V
MDC IDC MSMT LEADCHNL RA PACING THRESHOLD PULSEWIDTH: 0.4 ms
MDC IDC MSMT LEADCHNL RA SENSING INTR AMPL: 2.8 mV
MDC IDC MSMT LEADCHNL RV IMPEDANCE VALUE: 596 Ohm
MDC IDC MSMT LEADCHNL RV SENSING INTR AMPL: 5.6 mV
MDC IDC SESS DTM: 20170328105039
MDC IDC STAT BRADY AP VP PERCENT: 0.1 % — AB
MDC IDC STAT BRADY AP VS PERCENT: 25.9 %
MDC IDC STAT BRADY AS VP PERCENT: 0.2 %
MDC IDC STAT BRADY AS VS PERCENT: 73.8 %

## 2016-01-20 NOTE — Patient Instructions (Signed)
Your physician recommends that you continue on your current medications as directed. Please refer to the Current Medication list given to you today.  Remote monitoring is used to monitor your Pacemaker of ICD from home. This monitoring reduces the number of office visits required to check your device to one time per year. It allows [place] to keep an eye on the functioning of your device to ensure it is working properly. You are scheduled for a device check from home on April 20, 2016. You may send your transmission at any time that day. If you have a wireless device, the transmission will be sent automatically. After your physician reviews your transmission, you will receive a postcard with your next transmission date.  Dr Royann Shivers recommends that you schedule a follow-up appointment in 12 months with device check. You will receive a reminder letter in the mail two months in advance. If you don't receive a letter, please call our office to schedule the follow-up appointment.  If you need a refill on your cardiac medications before your next appointment, please call your pharmacy.

## 2016-01-20 NOTE — Progress Notes (Signed)
Patient ID: [name], female   DOB: June 25, 1934, 82 y.o.   MRN: [id number]     Cardiology Office Note    Date:  01/21/2016   ID:  [name], DOB Feb 27, 1934, MRN [id number]  PCP:  Carolyne Fiscal, MD  Cardiologist:   [name], MD   Chief Complaint  Patient presents with  . Annual Exam    pt states no chest pain no edema no light headedeness or dizziness and no SOB    History of Present Illness:  [name] is a 82 y.o. female with a history of coronary artery disease, status post aortic valve biological prosthesis replacement, mild mitral valve prolapse with mild mitral insufficiency, history of syncope due to second degree AV block Mobitz type II and a dual-chamber permanent pacemaker ([employer], 2016), small AAA.  She is here 14 follow-up and has not had any recent complaints. Obtaining history is a little difficult since she seems to have worsening memory problems. Her daughter accompanies her today and confirms that she has not had issues with chest pain or shortness of breath. She has not had syncope, palpitations or leg edema  Interrogation of her pacemaker shows normal device function. Chest 26% atrial pacing and only 0.3% ventricular pacing. Generator longevity is estimated to be 14.5 years. She has had occasional episodes of paroxysmal atrial tachycardia, none lasting over 5 minutes and all asymptomatic.    Past Medical History  Diagnosis Date  . Asthma   . Hypertension   . Coronary artery disease     CABG 03/29/08  . Aortic stenosis 03/29/2008    biological prosthetic replacement  . Dyslipidemia   . AAA (abdominal aortic aneurysm) (HCC) 01/03/13    3.5x3.3cm  . LBBB (left bundle branch block)   . PONV (postoperative nausea and vomiting)   . Hypothyroidism   . Heart murmur   . MVP (mitral valve prolapse)     with mild mitral insufficiency/notes 08/17/2013  . Exertional shortness of breath   . Sinus pause 10/31/2014  . Mobitz type 2 second degree  AV block 10/31/2014  . Presence of permanent cardiac pacemaker   . Family history of adverse reaction to anesthesia     "daughter gets PONV too"  . Chronic asthmatic bronchitis (HCC)     "q fall and winter" (11/05/2014)  . Anemia     "hx of chronic" (11/05/2014)  . Migraines     "none in years' (11/05/2014)  . Stroke [facility]) 03/2008    "2 light strokes"; denies residual on 11/05/2014  . Arthritis     "joints" (11/05/2014)  . Urinary frequency   . Alzheimer's dementia     "probably middle stage" (11/05/2014)    Past Surgical History  Procedure Laterality Date  . Coronary artery bypass graft  03/29/2008    LIMA TO LAD,SVG TO CX,SVG TO LEFT POSTEROLATERAL BRANCH  . Aortic valve replacement  03/29/2008    PERICARDIAL TISSUE VALVE  . Uterine fibroid surgery  1960's  . Retinal detachment surgery Left 1994    [name] 04/10/2008 (08/17/2013)  . Cataract extraction w/ intraocular lens implant Left   . Abdominal hysterectomy  1977  . Appendectomy  ?1977  . Hip fracture surgery Right 06/2010    "3 metal screws"   . Refractive surgery Bilateral     [name] 04/28/2008 (08/17/2013)  . Cardiac catheterization  ~ 2009  . Loop recorder implant N/A 09/17/2013    Procedure: LOOP RECORDER IMPLANT;  Surgeon: [name], MD;  Location: [facility]  CATH LAB;  Service: Cardiovascular;  Laterality: N/A;  . Insert / replace / remove pacemaker  11/05/2014  . Tibial tuberclerplasty  11/05/2014  . Loop recorder explant N/A 11/05/2014    Procedure: LOOP RECORDER EXPLANT;  Surgeon: [name], MD;  Location: [facility] CATH [facility];  Service: Cardiovascular;  Laterality: N/A;  . Permanent pacemaker insertion N/A 11/05/2014    Procedure: PERMANENT PACEMAKER INSERTION;  Surgeon: [name], MD;  Location: [facility] CATH [facility];  Service: Cardiovascular;  Laterality: N/A;    Current Medications: Outpatient Prescriptions Prior to Visit  Medication Sig Dispense Refill  . albuterol (PROVENTIL HFA;VENTOLIN HFA) 108 (90 BASE) MCG/ACT inhaler Inhale  2 puffs into the lungs every 4 (four) hours as needed (for congestion or cough).     Marland Kitchen alendronate (FOSAMAX) 70 MG tablet Take 70 mg by mouth once a week. On Sundays. Take with a full glass of water on an empty stomach.    Marland Kitchen aspirin 81 MG chewable tablet Chew 162 mg by mouth every morning.     . betamethasone valerate lotion (VALISONE) 0.1 % Apply 1 application topically 2 (two) times daily as needed (for dry itchy ears).    . brinzolamide (AZOPT) 1 % ophthalmic suspension Place 1 drop into the left eye 2 (two) times daily.    Marland Kitchen donepezil (ARICEPT) 10 MG tablet Take 10 mg by mouth at bedtime.    . fluticasone (FLOVENT HFA) 110 MCG/ACT inhaler Inhale 1 puff into the lungs 2 (two) times daily as needed (for wheezing).     . Fluticasone-Salmeterol (ADVAIR) 250-50 MCG/DOSE AEPB Inhale 1 puff into the lungs 2 (two) times daily.    Marland Kitchen levothyroxine (SYNTHROID, LEVOTHROID) 50 MCG tablet Take 50 mcg by mouth daily before breakfast.    . losartan (COZAAR) 100 MG tablet Take 100 mg by mouth every morning.     . memantine (NAMENDA) 10 MG tablet Take 10 mg by mouth 2 (two) times daily.    . Multiple Vitamin (MULTIVITAMIN WITH MINERALS) TABS tablet Take 1 tablet by mouth daily.    . Multiple Vitamins-Minerals (CENTRUM SILVER PO) Take 1 tablet by mouth daily.    Marland Kitchen omeprazole (PRILOSEC) 20 MG capsule Take 20 mg by mouth 2 (two) times daily.     . simvastatin (ZOCOR) 40 MG tablet Take 40 mg by mouth every evening.    . tolterodine (DETROL) 2 MG tablet Take 2 mg by mouth at bedtime.    . triamterene-hydrochlorothiazide (MAXZIDE-25) 37.5-25 MG per tablet Take 1 tablet by mouth every morning.     . latanoprost (XALATAN) 0.005 % ophthalmic solution Place 1 drop into the left eye at bedtime.     No facility-administered medications prior to visit.     Allergies:   Codeine and Morphine and related   Social History   Social History  . Marital Status: Divorced    Spouse Name: N/A  . Number of Children: N/A  .  Years of Education: N/A   Social History Main Topics  . Smoking status: Never Smoker   . Smokeless tobacco: Never Used  . Alcohol Use: No  . Drug Use: No  . Sexual Activity: No   Other Topics Concern  . None   Social History Narrative     Family History:  The patient's family history includes Diabetes in her mother; Heart failure in her mother; Hyperlipidemia in her mother; Hypertension in her mother; Melanoma in her grandchild. There is no history of Colon cancer.   ROS:   Please  see the history of present illness.    ROS All other systems reviewed and are negative.   PHYSICAL EXAM:   VS:  BP 144/75 mmHg  Pulse 61  Ht  (1.727 m)  Wt 66.316 kg (146 lb 3.2 oz)  BMI 22.23 kg/m2   GEN: Well nourished, well developed, in no acute distress HEENT: normal Neck: no JVD, carotid bruits, or masses Cardiac: RRR; no murmurs, rubs, or gallops,no edema, healthy left subclavian pacemaker site  Respiratory:  clear to auscultation bilaterally, normal work of breathing GI: soft, nontender, nondistended, + BS MS: no deformity or atrophy Skin: warm and dry, no rash Neuro:  Alert and Oriented x 3, Strength and sensation are intact Psych: euthymic mood, full affect  Wt Readings from Last 3 Encounters:  01/20/16 66.316 kg (146 lb 3.2 oz)  01/13/15 71.668 kg (158 lb)  12/19/14 71.124 kg (156 lb 12.8 oz)      Studies/Labs Reviewed:   EKG:  EKG is ordered today.  The ekg ordered today demonstrates Sinus rhythm, right bundle branch block, left anterior fascicular block   ASSESSMENT:    1. Second degree AV block, Mobitz type II   2. Pacemaker   3. PAT (paroxysmal atrial tachycardia) (HCC)   4. Coronary artery disease involving native coronary artery of native heart without angina pectoris   5. S/P AVR 2009 [facility] Ease 21 mm bovine pericardial   6. MVP (mitral valve prolapse) with mild mitral insufficiency   7. Abdominal aortic aneurysm (AAA) without rupture (HCC)       PLAN:  In order of problems listed above:  1. Second-degree AV block, Mobitz type II: No syncope since device implantation. She had up to 9.5 second pauses due to second-degree AV block. However, she requires very infrequent ventricular pacing, less than 1%. 2. Pacemaker: Normal pacemaker function, no changes made today. Follow-up via remote CareLink downloads every 3 months 3. Atrial tachycardia is infrequent and asymptomatic. No specific therapy recommended today. Might be related to treatment with beta agonist medications for reactive airway disease. Atrial fibrillation has not been documented 4. CAD s/p CABG: Asymptomatic. No angina. On statin. Request lipid profile from Dr. Geoffery Lyons office. 5. S/P AVR 2009 [facility] Ease 21 mm bovine pericardial: Last evaluated by echocardiography in March 2015 with normal valve gradients and no evidence of insufficiency. (Peak 28, mean 17 mm Hg).  6. MVP/MR: Not hemodynamically significant 7. AAA: Stable diameter by ultrasound performed just a few days ago. Recheck in one year.   Medication Adjustments/Labs and Tests Ordered: Current medicines are reviewed at length with the patient today.  Concerns regarding medicines are outlined above.  Medication changes, Labs and Tests ordered today are listed in the Patient Instructions below. Patient Instructions  Your physician recommends that you continue on your current medications as directed. Please refer to the Current Medication list given to you today.  Remote monitoring is used to monitor your Pacemaker of ICD from home. This monitoring reduces the number of office visits required to check your device to one time per year. It allows [place] to keep an eye on the functioning of your device to ensure it is working properly. You are scheduled for a device check from home on April 20, 2016. You may send your transmission at any time that day. If you have a wireless device, the transmission will be sent  automatically. After your physician reviews your transmission, you will receive a postcard with your next transmission date.  Dr Royann Shivers recommends that you schedule a follow-up appointment in 12 months with device check. You will receive a reminder letter in the mail two months in advance. If you don't receive a letter, please call our office to schedule the follow-up appointment.  If you need a refill on your cardiac medications before your next appointment, please call your pharmacy.     Joie Bimler, MD  01/21/2016 6:22 PM    [facility] [address] [place], [place], [place]  [address] Phone: [phone]; Fax: [phone]

## 2016-01-21 DIAGNOSIS — I471 Supraventricular tachycardia: Secondary | ICD-10-CM | POA: Insufficient documentation

## 2016-01-26 ENCOUNTER — Encounter: Payer: Self-pay | Admitting: Cardiovascular Disease

## 2016-04-20 ENCOUNTER — Telehealth: Payer: Self-pay | Admitting: Cardiology

## 2016-04-20 ENCOUNTER — Ambulatory Visit (INDEPENDENT_AMBULATORY_CARE_PROVIDER_SITE_OTHER): Payer: Medicare Other | Admitting: *Deleted

## 2016-04-20 DIAGNOSIS — I441 Atrioventricular block, second degree: Secondary | ICD-10-CM

## 2016-04-20 NOTE — Telephone Encounter (Signed)
LMOVM reminding pt to send remote transmission.   

## 2016-04-21 ENCOUNTER — Telehealth: Payer: Self-pay | Admitting: Cardiovascular Disease

## 2016-04-21 ENCOUNTER — Encounter: Payer: Self-pay | Admitting: Cardiology

## 2016-04-21 LAB — CUP PACEART REMOTE DEVICE CHECK
Battery Voltage: 2.79 V
Brady Statistic AP VP Percent: 0.1 % — CL
Brady Statistic AS VP Percent: 0.2 %
Date Time Interrogation Session: 20170628114915
Implantable Lead Implant Date: 20160112
Implantable Lead Location: 753860
Implantable Lead Model: 5076
Lead Channel Impedance Value: 596 Ohm
Lead Channel Sensing Intrinsic Amplitude: 2.8 mV
Lead Channel Sensing Intrinsic Amplitude: 5.6 mV
Lead Channel Setting Pacing Amplitude: 1.5 V
Lead Channel Setting Pacing Pulse Width: 0.4 ms
Lead Channel Setting Sensing Sensitivity: 2.8 mV
MDC IDC LEAD IMPLANT DT: 20160112
MDC IDC LEAD LOCATION: 753859
MDC IDC MSMT BATTERY IMPEDANCE: 109 Ohm
MDC IDC MSMT BATTERY REMAINING LONGEVITY: 174 mo
MDC IDC MSMT LEADCHNL RA IMPEDANCE VALUE: 500 Ohm
MDC IDC MSMT LEADCHNL RA PACING THRESHOLD AMPLITUDE: 0.5 V
MDC IDC MSMT LEADCHNL RA PACING THRESHOLD PULSEWIDTH: 0.4 ms
MDC IDC MSMT LEADCHNL RV PACING THRESHOLD AMPLITUDE: 0.625 V
MDC IDC MSMT LEADCHNL RV PACING THRESHOLD PULSEWIDTH: 0.4 ms
MDC IDC SET LEADCHNL RV PACING AMPLITUDE: 2 V
MDC IDC STAT BRADY AP VS PERCENT: 26.6 %
MDC IDC STAT BRADY AS VS PERCENT: 73.1 %

## 2016-04-21 NOTE — Telephone Encounter (Signed)
New message       1. Has your device fired? no  2. Is you device beeping? no  3. Are you experiencing draining or swelling at device site? no  4. Are you calling to see if we received your device transmission? The daughter sent the signal last night and making sure it came through last night, please call daughter back she wants to know  5. Have you passed out? no

## 2016-04-21 NOTE — Progress Notes (Signed)
Remote pacemaker transmission.   

## 2016-04-21 NOTE — Telephone Encounter (Signed)
Returned [name]'s call.  Advised that Carelink transmission was received and device function is WNL for patient.  Patient's daughter is appreciative of call can denies additional questions or concerns at this time.

## 2016-06-01 ENCOUNTER — Telehealth: Payer: Self-pay | Admitting: Cardiovascular Disease

## 2016-06-01 NOTE — Telephone Encounter (Signed)
New message      Calling to get the most recent size of her aortic aneurysm.

## 2016-06-01 NOTE — Telephone Encounter (Signed)
Returned call to daughter (ok per DPR)-advised of last noted size of AAA per OV note 12/2015 with MD Croitoru.  Daughter just wanted to know, reports her dad was seen in the ED this weekend and was dx with a 8.7 cm, therefore wanted to keep track of how big her mothers is and how often it is being monitored.    Daughter verbalized understanding and advised to call with further questions/concerns.

## 2016-06-09 ENCOUNTER — Telehealth: Payer: Self-pay | Admitting: Cardiovascular Disease

## 2016-06-09 NOTE — Telephone Encounter (Signed)
New message      Pt dtr wants to know if the pt needs to have an AAA ultrasound or can it wait until pt comes back in March 2018. Please advise

## 2016-06-09 NOTE — Telephone Encounter (Signed)
Spoke with daughter [name], informed her that AAA doppler was due 12/2015. Informed her that a scheduler will contact her to arrange this for her mother. She voiced understanding.   Message routed to [facility]

## 2016-06-10 ENCOUNTER — Other Ambulatory Visit: Payer: Self-pay | Admitting: Cardiovascular Disease

## 2016-06-10 DIAGNOSIS — I714 Abdominal aortic aneurysm, without rupture, unspecified: Secondary | ICD-10-CM

## 2016-06-15 ENCOUNTER — Ambulatory Visit (HOSPITAL_COMMUNITY)
Admission: RE | Admit: 2016-06-15 | Discharge: 2016-06-15 | Disposition: A | Discharge status: Home / Self Care | Payer: Medicare Other | Source: Ambulatory Visit | Attending: Cardiovascular Disease | Admitting: Cardiovascular Disease

## 2016-06-15 DIAGNOSIS — I708 Atherosclerosis of other arteries: Secondary | ICD-10-CM | POA: Diagnosis not present

## 2016-06-15 DIAGNOSIS — I10 Essential (primary) hypertension: Secondary | ICD-10-CM | POA: Insufficient documentation

## 2016-06-15 DIAGNOSIS — I2510 Atherosclerotic heart disease of native coronary artery without angina pectoris: Secondary | ICD-10-CM | POA: Diagnosis not present

## 2016-06-15 DIAGNOSIS — I714 Abdominal aortic aneurysm, without rupture, unspecified: Secondary | ICD-10-CM

## 2016-06-15 DIAGNOSIS — I700 Atherosclerosis of aorta: Secondary | ICD-10-CM | POA: Insufficient documentation

## 2016-06-15 DIAGNOSIS — E039 Hypothyroidism, unspecified: Secondary | ICD-10-CM | POA: Insufficient documentation

## 2016-06-15 DIAGNOSIS — E785 Hyperlipidemia, unspecified: Secondary | ICD-10-CM | POA: Insufficient documentation

## 2016-07-21 ENCOUNTER — Ambulatory Visit (INDEPENDENT_AMBULATORY_CARE_PROVIDER_SITE_OTHER): Payer: Medicare Other | Admitting: *Deleted

## 2016-07-21 ENCOUNTER — Telehealth: Payer: Self-pay | Admitting: Cardiology

## 2016-07-21 DIAGNOSIS — I441 Atrioventricular block, second degree: Secondary | ICD-10-CM

## 2016-07-21 NOTE — Telephone Encounter (Signed)
LMOVM reminding pt to send remote transmission.   

## 2016-07-22 NOTE — Progress Notes (Signed)
Remote pacemaker transmission.   

## 2016-07-28 ENCOUNTER — Encounter: Payer: Self-pay | Admitting: Cardiology

## 2016-08-18 LAB — CUP PACEART REMOTE DEVICE CHECK
Battery Remaining Longevity: 172 mo
Battery Voltage: 2.79 V
Brady Statistic AS VP Percent: 0 %
Date Time Interrogation Session: 20170927204551
Implantable Lead Implant Date: 20160112
Implantable Lead Location: 753859
Implantable Lead Model: 5076
Implantable Lead Model: 5076
Lead Channel Impedance Value: 507 Ohm
Lead Channel Pacing Threshold Amplitude: 0.5 V
Lead Channel Pacing Threshold Amplitude: 0.75 V
Lead Channel Pacing Threshold Pulse Width: 0.4 ms
Lead Channel Pacing Threshold Pulse Width: 0.4 ms
Lead Channel Setting Pacing Amplitude: 2 V
Lead Channel Setting Sensing Sensitivity: 2.8 mV
MDC IDC LEAD IMPLANT DT: 20160112
MDC IDC LEAD LOCATION: 753860
MDC IDC MSMT BATTERY IMPEDANCE: 109 Ohm
MDC IDC MSMT LEADCHNL RA SENSING INTR AMPL: 2.8 mV
MDC IDC MSMT LEADCHNL RV IMPEDANCE VALUE: 569 Ohm
MDC IDC MSMT LEADCHNL RV SENSING INTR AMPL: 5.6 mV
MDC IDC SET LEADCHNL RA PACING AMPLITUDE: 1.5 V
MDC IDC SET LEADCHNL RV PACING PULSEWIDTH: 0.4 ms
MDC IDC STAT BRADY AP VP PERCENT: 0 %
MDC IDC STAT BRADY AP VS PERCENT: 26 %
MDC IDC STAT BRADY AS VS PERCENT: 74 %

## 2016-10-20 ENCOUNTER — Telehealth: Payer: Self-pay | Admitting: Cardiology

## 2016-10-20 ENCOUNTER — Ambulatory Visit (INDEPENDENT_AMBULATORY_CARE_PROVIDER_SITE_OTHER): Payer: Medicare Other | Admitting: *Deleted

## 2016-10-20 DIAGNOSIS — I714 Abdominal aortic aneurysm, without rupture, unspecified: Secondary | ICD-10-CM

## 2016-10-20 NOTE — Telephone Encounter (Signed)
LMOVM reminding pt to send remote transmission.   

## 2016-11-12 ENCOUNTER — Encounter: Payer: Self-pay | Admitting: Cardiology

## 2016-11-12 NOTE — Progress Notes (Signed)
Remote pacemaker transmission.   

## 2016-11-15 ENCOUNTER — Encounter: Payer: Self-pay | Admitting: Primary Care

## 2016-11-15 ENCOUNTER — Ambulatory Visit (INDEPENDENT_AMBULATORY_CARE_PROVIDER_SITE_OTHER): Payer: Medicare Other | Admitting: Primary Care

## 2016-11-15 VITALS — BP 136/76 | HR 62 | Temp 98.4°F | Ht 68.0 in | Wt 146.1 lb

## 2016-11-15 DIAGNOSIS — I2510 Atherosclerotic heart disease of native coronary artery without angina pectoris: Secondary | ICD-10-CM | POA: Diagnosis not present

## 2016-11-15 DIAGNOSIS — J4530 Mild persistent asthma, uncomplicated: Secondary | ICD-10-CM | POA: Insufficient documentation

## 2016-11-15 DIAGNOSIS — F0390 Unspecified dementia without behavioral disturbance: Secondary | ICD-10-CM

## 2016-11-15 DIAGNOSIS — I714 Abdominal aortic aneurysm, without rupture, unspecified: Secondary | ICD-10-CM

## 2016-11-15 DIAGNOSIS — I10 Essential (primary) hypertension: Secondary | ICD-10-CM | POA: Diagnosis not present

## 2016-11-15 DIAGNOSIS — F329 Major depressive disorder, single episode, unspecified: Secondary | ICD-10-CM | POA: Insufficient documentation

## 2016-11-15 DIAGNOSIS — F3342 Major depressive disorder, recurrent, in full remission: Secondary | ICD-10-CM

## 2016-11-15 DIAGNOSIS — J449 Chronic obstructive pulmonary disease, unspecified: Secondary | ICD-10-CM

## 2016-11-15 DIAGNOSIS — M810 Age-related osteoporosis without current pathological fracture: Secondary | ICD-10-CM | POA: Insufficient documentation

## 2016-11-15 DIAGNOSIS — E785 Hyperlipidemia, unspecified: Secondary | ICD-10-CM

## 2016-11-15 DIAGNOSIS — R32 Unspecified urinary incontinence: Secondary | ICD-10-CM | POA: Insufficient documentation

## 2016-11-15 NOTE — Assessment & Plan Note (Signed)
Improved since initiation of Fluoxetine. Continue same.

## 2016-11-15 NOTE — Assessment & Plan Note (Signed)
Managed on Fosamax. No recent bone density on file, will obtain records.

## 2016-11-15 NOTE — Assessment & Plan Note (Addendum)
Managed on atorvastatin 40. Continue same, repeat lipids in July 2018.

## 2016-11-15 NOTE — Assessment & Plan Note (Signed)
Following with Cardiology annually.

## 2016-11-15 NOTE — Assessment & Plan Note (Signed)
MMSE today of 29. She does appear to have symptoms of Dementia based off of family comments and her presentation today. Continue Namenda and Aricept. Referral to neurology placed today per family request. Discussed the trajectory of dementia, her likely begin vascular in origin.

## 2016-11-15 NOTE — Assessment & Plan Note (Signed)
Last TSH in July 2017 normal per patient, repeat in July 2017. Will clarify whether she's on brand Levoxyl or generic.

## 2016-11-15 NOTE — Progress Notes (Signed)
Subjective:    Patient ID: [name], female    DOB: 04-Feb-1934, 83 y.o.   MRN: [id number]  HPI  [name] is an 83 year old female who presents today to establish care and discuss the problems mentioned below. Will obtain old records. Her last physical was in July 2017. Her daughter is with her today who's providing information for her HPI.  1) CAD/AAA/Heart Block/tachycarida: Currently managed on Aspirin, Simvastatin. History of CABG x 3, aortic valve replacement. She currently follows with Cardiology annually. She denies chest pain, dizziness, shortness of breath.  2) Essential Hypertension: Currently managed on losartan 100 mg and triamterene-HCTZ 37.5-25 mg. Her BP in the office today is 136/86.   3) GERD: Previously managed on omeprazole 20 mg. She's not taken this medication recently as her last does was in October 2017. She denies esophageal burning, epigastric pain.   4) Dementia: Currently managed on donepezil 10 mg and memantine 10 mg for several years. Her daughter doesn't seem to notice a difference. Her daughter would like her formally evaluated by a neurologist as she's historically done well on MMSE tests completed by her PCP's.  She experiences symptoms of forgetfulness, short term memory loss, difficulty staying focused, she cannot manage her bills or medications. She can function with some household tasks, but does forget where to put the dishes when doing the dishes. She can make her bed, sweep the floors, dress and feed herself. Over the last month she's had some problems with recognizing great grandchildren.   5) Hyperlipidemia: Currently managed on Simvastatin 40 mg. Her last cholesterol check was Summer of 2017.  6) Urinary Incontinence: Currently managed on Detrol 2 mg at bedtime. This medication helps most of the time.  7) COPD: Currently managed on Advair daily and albuterol. No prior history of tobacco abuse but was subjected to second hand smoke most of  her life.  8) Hypothyroidism: Currently managed on Levoxyl 50 mcg. She underwent blood work in July 2017. She denies recent changes in her Levoxyl dose. Last Thyroid check was normal in July 2017.  Review of Systems  Constitutional: Negative for fatigue.  Respiratory: Negative for shortness of breath.   Cardiovascular: Negative for chest pain.  Gastrointestinal:       Denies esophageal reflux symptoms  Neurological: Negative for dizziness and weakness.  Psychiatric/Behavioral: Positive for decreased concentration. Negative for agitation and sleep disturbance.       Short term memory loss       Past Medical History:  Diagnosis Date  . AAA (abdominal aortic aneurysm) (HCC) 01/03/13   3.5x3.3cm  . Alzheimer's dementia    "probably middle stage" (11/05/2014)  . Anemia    "hx of chronic" (11/05/2014)  . Aortic stenosis 03/29/2008   biological prosthetic replacement  . Arthritis    "joints" (11/05/2014)  . Asthma   . Chronic asthmatic bronchitis (HCC)    "q fall and winter" (11/05/2014)  . Coronary artery disease    CABG 03/29/08  . Dyslipidemia   . Exertional shortness of breath   . Family history of adverse reaction to anesthesia    "daughter gets PONV too"  . Heart murmur   . Hypertension   . Hypothyroidism   . LBBB (left bundle branch block)   . Migraines    "none in years' (11/05/2014)  . Mobitz type 2 second degree AV block 10/31/2014  . MVP (mitral valve prolapse)    with mild mitral insufficiency/notes 08/17/2013  . PONV (postoperative nausea and  vomiting)   . Presence of permanent cardiac pacemaker   . Sinus pause 10/31/2014  . Stroke [facility] [place]) 03/2008   "2 light strokes"; denies residual on 11/05/2014  . Urinary frequency      Social History   Social History  . Marital status: Divorced    Spouse name: N/A  . Number of children: N/A  . Years of education: N/A   Occupational History  . Not on file.   Social History Main Topics  . Smoking status: Never Smoker  .  Smokeless tobacco: Never Used  . Alcohol use No  . Drug use: No  . Sexual activity: No   Other Topics Concern  . Not on file   Social History Narrative  . No narrative on file    Past Surgical History:  Procedure Laterality Date  . ABDOMINAL HYSTERECTOMY  1977  . AORTIC VALVE REPLACEMENT  03/29/2008   PERICARDIAL TISSUE VALVE  . APPENDECTOMY  ?1977  . CARDIAC CATHETERIZATION  ~ 2009  . CATARACT EXTRACTION W/ INTRAOCULAR LENS IMPLANT Left   . CORONARY ARTERY BYPASS GRAFT  03/29/2008   LIMA TO LAD,SVG TO CX,SVG TO LEFT POSTEROLATERAL BRANCH  . HIP FRACTURE SURGERY Right 06/2010   "3 metal screws"   . INSERT / REPLACE / REMOVE PACEMAKER  11/05/2014  . LOOP RECORDER EXPLANT N/A 11/05/2014   Procedure: LOOP RECORDER EXPLANT;  Surgeon: [name], MD;  Location: [facility];  Service: Cardiovascular;  Laterality: N/A;  . LOOP RECORDER IMPLANT N/A 09/17/2013   Procedure: LOOP RECORDER IMPLANT;  Surgeon: [name], MD;  Location: [facility];  Service: Cardiovascular;  Laterality: N/A;  . PERMANENT PACEMAKER INSERTION N/A 11/05/2014   Procedure: PERMANENT PACEMAKER INSERTION;  Surgeon: [name], MD;  Location: [facility];  Service: Cardiovascular;  Laterality: N/A;  . REFRACTIVE SURGERY Bilateral    [name] 04/28/2008 (08/17/2013)  . RETINAL DETACHMENT SURGERY Left 1994   [name] 04/10/2008 (08/17/2013)  . TIBIAL TUBERCLERPLASTY  11/05/2014  . UTERINE FIBROID SURGERY  1960's    Family History  Problem Relation Age of Onset  . Heart failure Mother   . Diabetes Mother   . Hypertension Mother   . Hyperlipidemia Mother   . Melanoma Grandchild   . Colon cancer Neg Hx     Allergies  Allergen Reactions  . Codeine Nausea And Vomiting and Other (See Comments)    Patient gets violently ill  . Morphine And Related Nausea And Vomiting and Other (See Comments)    Patient get violently ill    Current Outpatient Prescriptions on File Prior to Visit  Medication Sig Dispense Refill   . alendronate (FOSAMAX) 70 MG tablet Take 70 mg by mouth once a week. On Sundays. Take with a full glass of water on an empty stomach.    Marland Kitchen aspirin 81 MG chewable tablet Chew 162 mg by mouth every morning.     . donepezil (ARICEPT) 10 MG tablet Take 10 mg by mouth at bedtime.    . Fluticasone-Salmeterol (ADVAIR) 250-50 MCG/DOSE AEPB Inhale 1 puff into the lungs 2 (two) times daily.    Marland Kitchen levothyroxine (SYNTHROID, LEVOTHROID) 50 MCG tablet Take 50 mcg by mouth daily before breakfast.    . losartan (COZAAR) 100 MG tablet Take 100 mg by mouth every morning.     Marland Kitchen LUMIGAN 0.01 % SOLN Place 1 drop into the left eye at bedtime.  4  . memantine (NAMENDA) 10 MG tablet Take 10 mg by mouth 2 (two) times daily.    Marland Kitchen  tolterodine (DETROL) 2 MG tablet Take 4 mg by mouth at bedtime.     . triamterene-hydrochlorothiazide (MAXZIDE-25) 37.5-25 MG per tablet Take 1 tablet by mouth every morning.     Marland Kitchen amoxicillin (AMOXIL) 500 MG tablet Take 4 tablets by mouth as directed.  1   No current facility-administered medications on file prior to visit.     BP 136/76   Pulse 62   Temp 98.4 F (36.9 C) (Oral)   Ht 5\' 8"  (1.727 m)   Wt 146 lb 1.9 oz (66.3 kg)   SpO2 94%   BMI 22.22 kg/m    Objective:   Physical Exam  Constitutional: She is oriented to person, place, and time. She appears well-nourished.  Eyes: EOM are normal.  Neck: Neck supple.  Cardiovascular: Normal rate and regular rhythm.   Pulmonary/Chest: Effort normal and breath sounds normal.  Neurological: She is alert and oriented to person, place, and time. No cranial nerve deficit.  Skin: Skin is warm and dry.  Psychiatric: She has a normal mood and affect.          Assessment & Plan:

## 2016-11-15 NOTE — Assessment & Plan Note (Signed)
Never a smoker. COPD evident on chest xray from August 2016. Likely second hand smoke exposure. Continue Advair and Albuterol PRN.

## 2016-11-15 NOTE — Assessment & Plan Note (Signed)
Stable in the office today, continue Maxzide and losartan.

## 2016-11-15 NOTE — Progress Notes (Signed)
Pre visit review using our clinic review tool, if applicable. No additional management support is needed unless otherwise documented below in the visit note. 

## 2016-11-15 NOTE — Assessment & Plan Note (Signed)
Stable on Detrol HS.

## 2016-11-15 NOTE — Patient Instructions (Signed)
Stop by the front desk and speak with either [name] regarding your referral to Neurology.  Please follow up in July 2018 for your annual physical or sooner if needed.  It was a pleasure to meet you today! Please don't hesitate to call me with any questions. Welcome to [facility]!

## 2016-11-15 NOTE — Assessment & Plan Note (Signed)
Monitored per Cardiology.

## 2016-11-16 ENCOUNTER — Encounter: Payer: Self-pay | Admitting: Primary Care

## 2016-11-18 LAB — CUP PACEART REMOTE DEVICE CHECK
Battery Impedance: 109 Ohm
Brady Statistic AP VS Percent: 26 %
Brady Statistic AS VS Percent: 74 %
Date Time Interrogation Session: 20171228233910
Implantable Lead Implant Date: 20160112
Implantable Lead Location: 753860
Implantable Lead Model: 5076
Implantable Lead Model: 5076
Lead Channel Impedance Value: 554 Ohm
Lead Channel Pacing Threshold Pulse Width: 0.4 ms
Lead Channel Sensing Intrinsic Amplitude: 2.8 mV
Lead Channel Sensing Intrinsic Amplitude: 5.6 mV
Lead Channel Setting Pacing Amplitude: 1.5 V
Lead Channel Setting Sensing Sensitivity: 2 mV
MDC IDC LEAD IMPLANT DT: 20160112
MDC IDC LEAD LOCATION: 753859
MDC IDC MSMT BATTERY REMAINING LONGEVITY: 173 mo
MDC IDC MSMT BATTERY VOLTAGE: 2.78 V
MDC IDC MSMT LEADCHNL RA IMPEDANCE VALUE: 506 Ohm
MDC IDC MSMT LEADCHNL RA PACING THRESHOLD AMPLITUDE: 0.625 V
MDC IDC MSMT LEADCHNL RA PACING THRESHOLD PULSEWIDTH: 0.4 ms
MDC IDC MSMT LEADCHNL RV PACING THRESHOLD AMPLITUDE: 0.75 V
MDC IDC PG IMPLANT DT: 20160112
MDC IDC SET LEADCHNL RV PACING AMPLITUDE: 2 V
MDC IDC SET LEADCHNL RV PACING PULSEWIDTH: 0.4 ms
MDC IDC STAT BRADY AP VP PERCENT: 0 %
MDC IDC STAT BRADY AS VP PERCENT: 0 %

## 2016-11-22 ENCOUNTER — Encounter: Payer: Self-pay | Admitting: Primary Care

## 2016-11-22 ENCOUNTER — Other Ambulatory Visit: Payer: Self-pay | Admitting: Primary Care

## 2016-11-22 DIAGNOSIS — F0390 Unspecified dementia without behavioral disturbance: Secondary | ICD-10-CM

## 2016-11-22 DIAGNOSIS — F3342 Major depressive disorder, recurrent, in full remission: Secondary | ICD-10-CM

## 2016-11-22 MED ORDER — DONEPEZIL HCL 10 MG PO TABS: 10.0000 mg | ORAL_TABLET | Freq: Every day | ORAL | 1 refills | Status: DC

## 2016-11-22 MED ORDER — FLUOXETINE HCL 10 MG PO CAPS: 10.0000 mg | ORAL_CAPSULE | Freq: Every day | ORAL | 1 refills | Status: DC

## 2016-12-06 ENCOUNTER — Encounter: Payer: Self-pay | Admitting: Primary Care

## 2016-12-06 DIAGNOSIS — E785 Hyperlipidemia, unspecified: Secondary | ICD-10-CM

## 2016-12-06 MED ORDER — ATORVASTATIN CALCIUM 40 MG PO TABS: 40.0000 mg | ORAL_TABLET | Freq: Every day | ORAL | 1 refills | Status: DC

## 2016-12-06 NOTE — Telephone Encounter (Signed)
There is no Lipid Panel on file under the lab tab.

## 2016-12-16 ENCOUNTER — Ambulatory Visit (INDEPENDENT_AMBULATORY_CARE_PROVIDER_SITE_OTHER): Payer: Medicare Other | Admitting: Primary Care

## 2016-12-16 ENCOUNTER — Encounter: Payer: Self-pay | Admitting: Primary Care

## 2016-12-16 ENCOUNTER — Encounter: Payer: Self-pay | Admitting: Neurology

## 2016-12-16 ENCOUNTER — Ambulatory Visit (INDEPENDENT_AMBULATORY_CARE_PROVIDER_SITE_OTHER)
Admission: RE | Admit: 2016-12-16 | Discharge: 2016-12-16 | Disposition: A | Discharge status: Home / Self Care | Payer: Medicare Other | Source: Ambulatory Visit | Attending: Primary Care | Admitting: Primary Care

## 2016-12-16 ENCOUNTER — Ambulatory Visit (INDEPENDENT_AMBULATORY_CARE_PROVIDER_SITE_OTHER): Payer: Medicare Other | Admitting: Neurology

## 2016-12-16 VITALS — BP 128/78 | HR 69 | Temp 98.3°F | Ht 68.0 in | Wt 150.4 lb

## 2016-12-16 VITALS — BP 140/62 | HR 73 | Ht 68.0 in | Wt 152.0 lb

## 2016-12-16 DIAGNOSIS — R05 Cough: Secondary | ICD-10-CM

## 2016-12-16 DIAGNOSIS — G301 Alzheimer's disease with late onset: Secondary | ICD-10-CM | POA: Diagnosis not present

## 2016-12-16 DIAGNOSIS — R059 Cough, unspecified: Secondary | ICD-10-CM

## 2016-12-16 DIAGNOSIS — F0280 Dementia in other diseases classified elsewhere without behavioral disturbance: Secondary | ICD-10-CM | POA: Diagnosis not present

## 2016-12-16 NOTE — Progress Notes (Signed)
Pre visit review using our clinic review tool, if applicable. No additional management support is needed unless otherwise documented below in the visit note. 

## 2016-12-16 NOTE — Patient Instructions (Signed)
Complete xray(s) prior to leaving today. I will notify you of your results once received.  Itchy/watery eyes, runny nose, throat drainage: Try Claritin, Zyrtec, or Allegra.  Chest congestion, cough: Try Mucinex DM or Mucinex.  Please notify me if no improvement in cough in 1-2 weeks, if you run fevers, if you start coughing up green mucous.  It was a pleasure to see you today!

## 2016-12-16 NOTE — Progress Notes (Signed)
Subjective:    Patient ID: [name], female    DOB: 08/15/1934, 83 y.o.   MRN: [id number]  HPI  [name] is an 83 year old female with a history of dementia, COPD and HTN who presents today with a chief complaint of cough. Her daughter is supplying information for her HPI, patient is contributing as well.   She also reports chest congestion, nasal congestion, itchy/watery eyes. Her symptoms began 3 weeks ago. She is managed on Advair daily and albuterol PRN for COPD. She's compliant to her Advair as prescribed and has not had to use her albuterol inhaler much. She denies fevers, fatigue, weakness, shortness of breath, and doesn't feel sick. Her cough wet but she is not expelling mucous. She's not taken anything OTC for her symptoms.   Review of Systems  Constitutional: Negative for chills, fatigue and fever.  HENT: Positive for congestion and postnasal drip. Negative for ear pain, sinus pressure, sneezing and sore throat.   Eyes: Positive for itching.       Watery eyes  Respiratory: Positive for cough. Negative for shortness of breath and wheezing.   Cardiovascular: Negative for chest pain.       Past Medical History:  Diagnosis Date  . AAA (abdominal aortic aneurysm) (HCC) 01/03/13   3.5x3.3cm  . Alzheimer's dementia    "probably middle stage" (11/05/2014)  . Anemia    "hx of chronic" (11/05/2014)  . Aortic stenosis 03/29/2008   biological prosthetic replacement  . Arthritis    "joints" (11/05/2014)  . Asthma   . Chronic asthmatic bronchitis (HCC)    "q fall and winter" (11/05/2014)  . Coronary artery disease    CABG 03/29/08  . Dyslipidemia   . Exertional shortness of breath   . Family history of adverse reaction to anesthesia    "daughter gets PONV too"  . Heart murmur   . Hypertension   . Hypothyroidism   . LBBB (left bundle branch block)   . Migraines    "none in years' (11/05/2014)  . Mobitz type 2 second degree AV block 10/31/2014  . MVP (mitral valve prolapse)     with mild mitral insufficiency/notes 08/17/2013  . PONV (postoperative nausea and vomiting)   . Presence of permanent cardiac pacemaker   . Sinus pause 10/31/2014  . Stroke [facility]) 03/2008   "2 light strokes"; denies residual on 11/05/2014  . Urinary frequency      Social History   Social History  . Marital status: Divorced    Spouse name: N/A  . Number of children: N/A  . Years of education: N/A   Occupational History  . Not on file.   Social History Main Topics  . Smoking status: Never Smoker  . Smokeless tobacco: Never Used  . Alcohol use No  . Drug use: No  . Sexual activity: No   Other Topics Concern  . Not on file   Social History Narrative  . No narrative on file    Past Surgical History:  Procedure Laterality Date  . ABDOMINAL HYSTERECTOMY  1977  . AORTIC VALVE REPLACEMENT  03/29/2008   PERICARDIAL TISSUE VALVE  . APPENDECTOMY  ?1977  . CARDIAC CATHETERIZATION  ~ 2009  . CATARACT EXTRACTION W/ INTRAOCULAR LENS IMPLANT Left   . CORONARY ARTERY BYPASS GRAFT  03/29/2008   LIMA TO LAD,SVG TO CX,SVG TO LEFT POSTEROLATERAL BRANCH  . HIP FRACTURE SURGERY Right 06/2010   "3 metal screws"   . INSERT / REPLACE / REMOVE PACEMAKER  11/05/2014  . LOOP RECORDER EXPLANT N/A 11/05/2014   Procedure: LOOP RECORDER EXPLANT;  Surgeon: [name], MD;  Location: [facility];  Service: Cardiovascular;  Laterality: N/A;  . LOOP RECORDER IMPLANT N/A 09/17/2013   Procedure: LOOP RECORDER IMPLANT;  Surgeon: [name], MD;  Location: [facility];  Service: Cardiovascular;  Laterality: N/A;  . PERMANENT PACEMAKER INSERTION N/A 11/05/2014   Procedure: PERMANENT PACEMAKER INSERTION;  Surgeon: [name], MD;  Location: [facility];  Service: Cardiovascular;  Laterality: N/A;  . REFRACTIVE SURGERY Bilateral    [name] 04/28/2008 (08/17/2013)  . RETINAL DETACHMENT SURGERY Left 1994   [name] 04/10/2008 (08/17/2013)  . TIBIAL TUBERCLERPLASTY  11/05/2014  . UTERINE FIBROID SURGERY  1960's     Family History  Problem Relation Age of Onset  . Heart failure Mother   . Diabetes Mother   . Hypertension Mother   . Hyperlipidemia Mother   . Melanoma Grandchild   . Colon cancer Neg Hx     Allergies  Allergen Reactions  . Codeine Nausea And Vomiting and Other (See Comments)    Patient gets violently ill  . Morphine And Related Nausea And Vomiting and Other (See Comments)    Patient get violently ill    Current Outpatient Prescriptions on File Prior to Visit  Medication Sig Dispense Refill  . albuterol (PROVENTIL HFA;VENTOLIN HFA) 108 (90 Base) MCG/ACT inhaler Inhale 1 puff into the lungs every 6 (six) hours as needed for wheezing or shortness of breath.    Marland Kitchen alendronate (FOSAMAX) 70 MG tablet Take 70 mg by mouth once a week. On Sundays. Take with a full glass of water on an empty stomach.    Marland Kitchen aspirin 81 MG chewable tablet Chew 162 mg by mouth every morning.     Marland Kitchen atorvastatin (LIPITOR) 40 MG tablet Take 1 tablet (40 mg total) by mouth daily. 90 tablet 1  . donepezil (ARICEPT) 10 MG tablet Take 1 tablet (10 mg total) by mouth at bedtime. 30 tablet 1  . FLUoxetine (PROZAC) 10 MG capsule Take 1 capsule (10 mg total) by mouth daily. 90 capsule 1  . Fluticasone-Salmeterol (ADVAIR) 250-50 MCG/DOSE AEPB Inhale 1 puff into the lungs 2 (two) times daily.    Marland Kitchen levothyroxine (SYNTHROID, LEVOTHROID) 50 MCG tablet Take 50 mcg by mouth daily before breakfast.    . losartan (COZAAR) 100 MG tablet Take 100 mg by mouth every morning.     Marland Kitchen LUMIGAN 0.01 % SOLN Place 1 drop into the left eye at bedtime.  4  . memantine (NAMENDA) 10 MG tablet Take 10 mg by mouth 2 (two) times daily.    Marland Kitchen tolterodine (DETROL) 2 MG tablet Take 4 mg by mouth at bedtime.     . triamterene-hydrochlorothiazide (MAXZIDE-25) 37.5-25 MG per tablet Take 1 tablet by mouth every morning.     Marland Kitchen amoxicillin (AMOXIL) 500 MG tablet Take 4 tablets by mouth as directed.  1   No current facility-administered medications on  file prior to visit.     BP 128/78   Pulse 69   Temp 98.3 F (36.8 C) (Oral)   Ht 5\' 8"  (1.727 m)   Wt 150 lb 6.4 oz (68.2 kg)   SpO2 97%   BMI 22.87 kg/m    Objective:   Physical Exam  Constitutional: She appears well-nourished.  HENT:  Right Ear: Tympanic membrane and ear canal normal.  Left Ear: Tympanic membrane and ear canal normal.  Nose: Right sinus exhibits no maxillary sinus  tenderness and no frontal sinus tenderness. Left sinus exhibits no maxillary sinus tenderness and no frontal sinus tenderness.  Mouth/Throat: Oropharynx is clear and moist.  Eyes: Conjunctivae are normal. Right eye exhibits no discharge. Left eye exhibits no discharge.  Neck: Neck supple.  Cardiovascular: Normal rate and regular rhythm.   Pulmonary/Chest: Effort normal and breath sounds normal. She has no wheezes. She has no rales.  Lymphadenopathy:    She has no cervical adenopathy.  Skin: Skin is warm and dry.          Assessment & Plan:  Cough:  Family has noticed this x 1 month. Patient denies feeling ill, fevers, fatigue.  She's had no change in appetite or activity. Exam today with clear lungs, doesn't appear ill, no conjunctivitis to right eye. Will check chest xray given COPD history. Suspect allergy involvement and will treat with oral antihistamine and Mucinex. Consider using albuterol during coughing spells. Discussed return precautions and encouraged hydration. Follow up PRN.  Morrie Sheldon, NP

## 2016-12-16 NOTE — Progress Notes (Signed)
Reason for visit: Memory disturbance  Referring physician: Dr. Sallye Ober is a 83 y.o. female  History of present illness:  [name] is an 83 year old right-handed white female with a history of a very slowly progressive memory disturbance that started in 2007. The patient has been on Aricept and Namenda, she is tolerating medications fairly well. The patient has some problems with short-term memory, and with word finding. The patient has difficulty remembering her grandchildren. She may repeat herself on occasion. She occasionally may misplace things about the house. She requires assistance with keeping up with medications and appointments, she does not do her own finances. She currently lives with her daughter. She has become somewhat socially withdrawn. She has had a reduced appetite, she is not eating well, but she has not had any problems with weight loss. She sleeps well at night, she denies any numbness or weakness of the extremities or difficulty with headaches or dizziness. She has some slight imbalance problems associated with a right hip issue. She has not had any falls, she uses a cane for ambulation. She may have some occasional urinary incontinence, otherwise the bowels are working well. She is sent to this office for an evaluation.  Past Medical History:  Diagnosis Date  . AAA (abdominal aortic aneurysm) (HCC) 01/03/13   3.5x3.3cm  . Alzheimer's dementia    "probably middle stage" (11/05/2014)  . Anemia    "hx of chronic" (11/05/2014)  . Aortic stenosis 03/29/2008   biological prosthetic replacement  . Arthritis    "joints" (11/05/2014)  . Asthma   . Chronic asthmatic bronchitis (HCC)    "q fall and winter" (11/05/2014)  . Coronary artery disease    CABG 03/29/08  . Dyslipidemia   . Exertional shortness of breath   . Family history of adverse reaction to anesthesia    "daughter gets PONV too"  . Heart murmur   . Hypertension   . Hypothyroidism   . LBBB  (left bundle branch block)   . Migraines    "none in years' (11/05/2014)  . Mobitz type 2 second degree AV block 10/31/2014  . MVP (mitral valve prolapse)    with mild mitral insufficiency/notes 08/17/2013  . PONV (postoperative nausea and vomiting)   . Presence of permanent cardiac pacemaker   . Sinus pause 10/31/2014  . Stroke [facility]) 03/2008   "2 light strokes"; denies residual on 11/05/2014  . Urinary frequency     Past Surgical History:  Procedure Laterality Date  . ABDOMINAL HYSTERECTOMY  1977  . AORTIC VALVE REPLACEMENT  03/29/2008   PERICARDIAL TISSUE VALVE  . APPENDECTOMY  ?1977  . CARDIAC CATHETERIZATION  ~ 2009  . CATARACT EXTRACTION W/ INTRAOCULAR LENS IMPLANT Left   . CORONARY ARTERY BYPASS GRAFT  03/29/2008   LIMA TO LAD,SVG TO CX,SVG TO LEFT POSTEROLATERAL BRANCH  . HIP FRACTURE SURGERY Right 06/2010   "3 metal screws"   . INSERT / REPLACE / REMOVE PACEMAKER  11/05/2014  . LOOP RECORDER EXPLANT N/A 11/05/2014   Procedure: LOOP RECORDER EXPLANT;  Surgeon: [name], MD;  Location: [facility];  Service: Cardiovascular;  Laterality: N/A;  . LOOP RECORDER IMPLANT N/A 09/17/2013   Procedure: LOOP RECORDER IMPLANT;  Surgeon: [name], MD;  Location: [facility];  Service: Cardiovascular;  Laterality: N/A;  . PERMANENT PACEMAKER INSERTION N/A 11/05/2014   Procedure: PERMANENT PACEMAKER INSERTION;  Surgeon: [name], MD;  Location: [facility];  Service: Cardiovascular;  Laterality: N/A;  .  REFRACTIVE SURGERY Bilateral    [name] 04/28/2008 (08/17/2013)  . RETINAL DETACHMENT SURGERY Left 1994   [name] 04/10/2008 (08/17/2013)  . TIBIAL TUBERCLERPLASTY  11/05/2014  . UTERINE FIBROID SURGERY  1960's    Family History  Problem Relation Age of Onset  . Heart failure Mother   . Diabetes Mother   . Hypertension Mother   . Hyperlipidemia Mother   . Melanoma Grandchild   . Colon cancer Neg Hx     Social history:  reports that she has never smoked. She has never used  smokeless tobacco. She reports that she does not drink alcohol or use drugs.  Medications:  Prior to Admission medications   Medication Sig Start Date End Date Taking? Authorizing Provider  albuterol (PROVENTIL HFA;VENTOLIN HFA) 108 (90 Base) MCG/ACT inhaler Inhale 1 puff into the lungs every 6 (six) hours as needed for wheezing or shortness of breath.   Yes Historical Provider, MD  alendronate (FOSAMAX) 70 MG tablet Take 70 mg by mouth once a week. On Sundays. Take with a full glass of water on an empty stomach.   Yes Historical Provider, MD  amoxicillin (AMOXIL) 500 MG tablet Take 4 tablets by mouth as directed. 12/10/15  Yes Historical Provider, MD  aspirin 81 MG chewable tablet Chew 162 mg by mouth every morning.    Yes Historical Provider, MD  atorvastatin (LIPITOR) 40 MG tablet Take 1 tablet (40 mg total) by mouth daily. 12/06/16  Yes Doreene Nest, NP  donepezil (ARICEPT) 10 MG tablet Take 1 tablet (10 mg total) by mouth at bedtime. 11/22/16  Yes Doreene Nest, NP  FLUoxetine (PROZAC) 10 MG capsule Take 1 capsule (10 mg total) by mouth daily. 11/22/16  Yes Doreene Nest, NP  Fluticasone-Salmeterol (ADVAIR) 250-50 MCG/DOSE AEPB Inhale 1 puff into the lungs 2 (two) times daily.   Yes Historical Provider, MD  levothyroxine (SYNTHROID, LEVOTHROID) 50 MCG tablet Take 50 mcg by mouth daily before breakfast.   Yes Historical Provider, MD  losartan (COZAAR) 100 MG tablet Take 100 mg by mouth every morning.    Yes Historical Provider, MD  LUMIGAN 0.01 % SOLN Place 1 drop into the left eye at bedtime. 11/05/15  Yes Historical Provider, MD  memantine (NAMENDA) 10 MG tablet Take 10 mg by mouth 2 (two) times daily.   Yes Historical Provider, MD  tolterodine (DETROL) 2 MG tablet Take 4 mg by mouth at bedtime.    Yes Historical Provider, MD  triamterene-hydrochlorothiazide (MAXZIDE-25) 37.5-25 MG per tablet Take 1 tablet by mouth every morning.    Yes Historical Provider, MD      Allergies    Allergen Reactions  . Codeine Nausea And Vomiting and Other (See Comments)    Patient gets violently ill  . Morphine And Related Nausea And Vomiting and Other (See Comments)    Patient get violently ill    ROS:  Out of a complete 14 system review of symptoms, the patient complains only of the following symptoms, and all other reviewed systems are negative.  Heart murmur Hearing loss Eye pain, cough, snoring Feeling cold Runny nose Memory loss, confusion Depression, change in appetite Snoring  Blood pressure 140/62, pulse 73, height 5\' 8"  (1.727 m), weight 152 lb (68.9 kg).  Physical Exam  General: The patient is alert and cooperative at the time of the examination.  Eyes: Pupils are equal, round, and reactive to light. Discs are flat bilaterally.  Neck: The neck is supple, bilateral carotid bruits are noted.  Respiratory: The respiratory examination is clear.  Cardiovascular: The cardiovascular examination reveals a regular rate and rhythm, a grade II/VI systolic ejection murmur at the aortic area is noted.  Skin: Extremities are without significant edema.  Neurologic Exam  Mental status: The patient is alert and oriented x 3 at the time of the examination. The patient has apparent normal recent and remote memory, with an apparently normal attention span and concentration ability. Mini-Mental Status Examination done today shows a total score 27/30.  Cranial nerves: Facial symmetry is present. There is good sensation of the face to pinprick and soft touch bilaterally. The strength of the facial muscles and the muscles to head turning and shoulder shrug are normal bilaterally. Speech is well enunciated, no aphasia or dysarthria is noted. Extraocular movements are full. Visual fields are full. The tongue is midline, and the patient has symmetric elevation of the soft palate. No obvious hearing deficits are noted.  Motor: The motor testing reveals 5 over 5 strength of all 4  extremities. Good symmetric motor tone is noted throughout.  Sensory: Sensory testing is intact to pinprick, soft touch, vibration sensation, and position sense on all 4 extremities. No evidence of extinction is noted.  Coordination: Cerebellar testing reveals good finger-nose-finger and heel-to-shin bilaterally.  Gait and station: Gait is associated with a limping quality on the right leg. The patient usually uses a cane for ambulation. The patient is able to perform tandem gait. Romberg is negative. No drift is seen.  Reflexes: Deep tendon reflexes are symmetric and normal bilaterally, with exception of a depression of the left knee jerk reflexes compared to the right. Toes are downgoing bilaterally.   CT head 08/17/13:  IMPRESSION: Mild atrophy with stable periventricular small vessel disease. There is no appreciable mass, hemorrhage, or acute appearing infarct.  * CT scan images were reviewed online. I agree with the written report.    Assessment/Plan:  1. Progressive memory disturbance  The patient likely does have Alzheimer's disease, but the progression has been quite slow. The patient is doing well on Aricept and Namenda, I would recommend continuing this medication regimen. The patient does have bilateral carotid bruits, and a carotid Doppler study done in 2014 was unremarkable. The family will contact our office if needed. I have little to add to the current treatment regimen.  Marlan Palau MD 12/16/2016 12:02 PM  [facility] [address] [place], [place] [address]  Phone [phone] Fax [phone]

## 2016-12-17 ENCOUNTER — Other Ambulatory Visit: Payer: Self-pay | Admitting: Primary Care

## 2016-12-17 ENCOUNTER — Encounter: Payer: Self-pay | Admitting: Primary Care

## 2016-12-17 DIAGNOSIS — R0989 Other specified symptoms and signs involving the circulatory and respiratory systems: Secondary | ICD-10-CM

## 2016-12-24 ENCOUNTER — Telehealth: Payer: Self-pay | Admitting: Primary Care

## 2016-12-24 ENCOUNTER — Ambulatory Visit (HOSPITAL_COMMUNITY)
Admission: RE | Admit: 2016-12-24 | Discharge: 2016-12-24 | Disposition: A | Discharge status: Home / Self Care | Payer: Medicare Other | Source: Ambulatory Visit | Attending: Cardiovascular Disease | Admitting: Cardiovascular Disease

## 2016-12-24 DIAGNOSIS — R0989 Other specified symptoms and signs involving the circulatory and respiratory systems: Secondary | ICD-10-CM

## 2016-12-24 DIAGNOSIS — I6523 Occlusion and stenosis of bilateral carotid arteries: Secondary | ICD-10-CM

## 2016-12-24 NOTE — Telephone Encounter (Signed)
Patient's daughter,Michelle,called.  She said Jae Dire had mentioned in an e-mail that she was going to order an Echocardiogram for patient.  She said she's waiting to hear about the appointment.

## 2016-12-25 NOTE — Telephone Encounter (Signed)
We discussed the echo, but in a result note it was stated that [name] wanted to speak with Ms. Baillargeon's cardiologist about this during their appointment on 01/03/17. Is this still the case?  Looks like she went for her carotid ultrasound on Friday 12/24/16.

## 2016-12-26 ENCOUNTER — Encounter: Payer: Self-pay | Admitting: Primary Care

## 2016-12-27 ENCOUNTER — Other Ambulatory Visit: Payer: Self-pay | Admitting: Primary Care

## 2016-12-27 DIAGNOSIS — R32 Unspecified urinary incontinence: Secondary | ICD-10-CM

## 2016-12-27 MED ORDER — OXYBUTYNIN CHLORIDE ER 5 MG PO TB24: 5.0000 mg | ORAL_TABLET | Freq: Every day | ORAL | 0 refills | Status: DC

## 2016-12-27 MED ORDER — TOLTERODINE TARTRATE ER 4 MG PO CP24: 4.0000 mg | ORAL_CAPSULE | Freq: Every evening | ORAL | 1 refills | Status: DC

## 2016-12-27 NOTE — Telephone Encounter (Signed)
Message left for patient to return my call.  

## 2016-12-28 NOTE — Telephone Encounter (Signed)
Patient's addressed to Jae Dire through [employer]

## 2017-01-10 ENCOUNTER — Encounter: Payer: Self-pay | Admitting: Primary Care

## 2017-01-11 ENCOUNTER — Encounter: Payer: Self-pay | Admitting: Cardiovascular Disease

## 2017-01-11 ENCOUNTER — Ambulatory Visit (INDEPENDENT_AMBULATORY_CARE_PROVIDER_SITE_OTHER): Payer: Medicare Other | Admitting: Cardiovascular Disease

## 2017-01-11 VITALS — BP 130/62 | HR 79 | Ht 68.0 in | Wt 151.0 lb

## 2017-01-11 DIAGNOSIS — I341 Nonrheumatic mitral (valve) prolapse: Secondary | ICD-10-CM | POA: Diagnosis not present

## 2017-01-11 DIAGNOSIS — Z952 Presence of prosthetic heart valve: Secondary | ICD-10-CM | POA: Diagnosis not present

## 2017-01-11 DIAGNOSIS — F0280 Dementia in other diseases classified elsewhere without behavioral disturbance: Secondary | ICD-10-CM | POA: Diagnosis not present

## 2017-01-11 DIAGNOSIS — I714 Abdominal aortic aneurysm, without rupture, unspecified: Secondary | ICD-10-CM

## 2017-01-11 DIAGNOSIS — I441 Atrioventricular block, second degree: Secondary | ICD-10-CM | POA: Diagnosis not present

## 2017-01-11 DIAGNOSIS — Z950 Presence of cardiac pacemaker: Secondary | ICD-10-CM | POA: Diagnosis not present

## 2017-01-11 DIAGNOSIS — E7800 Pure hypercholesterolemia, unspecified: Secondary | ICD-10-CM

## 2017-01-11 DIAGNOSIS — G301 Alzheimer's disease with late onset: Secondary | ICD-10-CM | POA: Diagnosis not present

## 2017-01-11 DIAGNOSIS — I471 Supraventricular tachycardia: Secondary | ICD-10-CM

## 2017-01-11 DIAGNOSIS — R05 Cough: Secondary | ICD-10-CM

## 2017-01-11 DIAGNOSIS — I2510 Atherosclerotic heart disease of native coronary artery without angina pectoris: Secondary | ICD-10-CM

## 2017-01-11 DIAGNOSIS — R059 Cough, unspecified: Secondary | ICD-10-CM

## 2017-01-11 NOTE — Progress Notes (Signed)
Patient ID: MAHAILEY TIMBROOK, female   DOB: 1933/12/05, 83 y.o.   MRN: [id number]     Cardiology Office Note    Date:  01/15/2017   ID:  KILEA ABDELLATIF, DOB 02/28/1934, MRN [id number]  PCP:  Morrie Sheldon, NP  Cardiologist:   [name], MD   chief complaint: abnormal CXR.   History of Present Illness:  KURSTYN NORTHRIP is a 83 y.o. female with a history of coronary artery disease, status post aortic valve biological prosthesis replacement, mild mitral valve prolapse with mild mitral insufficiency, history of syncope due to second degree AV block Mobitz type II and a dual-chamber permanent pacemaker ([employer], 2016), small AAA.  She is accompanied as usual by her daughter who provides a lot of the history, since Mrs. Crane's memory has deteriorated. Nevertheless, the patient is oriented and able to hold a coherent conversation. When asked about details of her symptoms she often looks to her daughter for help. She is seeing Dr. Lesia Sago who reports that she is doing well on combination of Aricept and Namenda and believes that the progression of her disease is quite slow.[name], NP is her new primary care provider  She had a protracted cough and chest x-ray was described as showing "fluid" on the lungs. The report actually describes bilateral small pleural effusions and on my review I don't see evidence of congestive heart failure. She has not had fever or chills and she has not had issues with chest pain or shortness of breath. She has not had syncope, palpitations or leg edema  Interrogation of her pacemaker shows normal device function. There is 25% atrial pacing and only 0.3% ventricular pacing. Generator longevity is estimated to be 14.5 years. She has had occasional episodes of paroxysmal atrial tachycardia, none lasting over 5 minutes and all asymptomatic. Last episode was documented in October 2017. Heart rate histogram appears appropriate.   Past Medical  History:  Diagnosis Date  . AAA (abdominal aortic aneurysm) (HCC) 01/03/13   3.5x3.3cm  . Alzheimer's dementia    "probably middle stage" (11/05/2014)  . Anemia    "hx of chronic" (11/05/2014)  . Aortic stenosis 03/29/2008   biological prosthetic replacement  . Arthritis    "joints" (11/05/2014)  . Asthma   . Chronic asthmatic bronchitis (HCC)    "q fall and winter" (11/05/2014)  . Coronary artery disease    CABG 03/29/08  . Dyslipidemia   . Exertional shortness of breath   . Family history of adverse reaction to anesthesia    "daughter gets PONV too"  . Heart murmur   . Hypertension   . Hypothyroidism   . LBBB (left bundle branch block)   . Migraines    "none in years' (11/05/2014)  . Mobitz type 2 second degree AV block 10/31/2014  . MVP (mitral valve prolapse)    with mild mitral insufficiency/notes 08/17/2013  . PONV (postoperative nausea and vomiting)   . Presence of permanent cardiac pacemaker   . Sinus pause 10/31/2014  . Stroke [facility]) 03/2008   "2 light strokes"; denies residual on 11/05/2014  . Urinary frequency     Past Surgical History:  Procedure Laterality Date  . ABDOMINAL HYSTERECTOMY  1977  . AORTIC VALVE REPLACEMENT  03/29/2008   PERICARDIAL TISSUE VALVE  . APPENDECTOMY  ?1977  . CARDIAC CATHETERIZATION  ~ 2009  . CATARACT EXTRACTION W/ INTRAOCULAR LENS IMPLANT Left   . CORONARY ARTERY BYPASS GRAFT  03/29/2008   LIMA TO LAD,SVG TO  CX,SVG TO LEFT POSTEROLATERAL BRANCH  . HIP FRACTURE SURGERY Right 06/2010   "3 metal screws"   . INSERT / REPLACE / REMOVE PACEMAKER  11/05/2014  . LOOP RECORDER EXPLANT N/A 11/05/2014   Procedure: LOOP RECORDER EXPLANT;  Surgeon: [name], MD;  Location: [facility];  Service: Cardiovascular;  Laterality: N/A;  . LOOP RECORDER IMPLANT N/A 09/17/2013   Procedure: LOOP RECORDER IMPLANT;  Surgeon: [name], MD;  Location: [facility];  Service: Cardiovascular;  Laterality: N/A;  . PERMANENT PACEMAKER INSERTION N/A 11/05/2014    Procedure: PERMANENT PACEMAKER INSERTION;  Surgeon: [name], MD;  Location: [facility];  Service: Cardiovascular;  Laterality: N/A;  . REFRACTIVE SURGERY Bilateral    [name] 04/28/2008 (08/17/2013)  . RETINAL DETACHMENT SURGERY Left 1994   [name] 04/10/2008 (08/17/2013)  . TIBIAL TUBERCLERPLASTY  11/05/2014  . UTERINE FIBROID SURGERY  1960's    Current Medications: Outpatient Medications Prior to Visit  Medication Sig Dispense Refill  . albuterol (PROVENTIL HFA;VENTOLIN HFA) 108 (90 Base) MCG/ACT inhaler Inhale 1 puff into the lungs every 6 (six) hours as needed for wheezing or shortness of breath.    Marland Kitchen alendronate (FOSAMAX) 70 MG tablet Take 70 mg by mouth once a week. On Sundays. Take with a full glass of water on an empty stomach.    Marland Kitchen amoxicillin (AMOXIL) 500 MG tablet Take 4 tablets by mouth as directed.  1  . aspirin 81 MG chewable tablet Chew 162 mg by mouth every morning.     Marland Kitchen atorvastatin (LIPITOR) 40 MG tablet Take 1 tablet (40 mg total) by mouth daily. 90 tablet 1  . donepezil (ARICEPT) 10 MG tablet Take 1 tablet (10 mg total) by mouth at bedtime. 30 tablet 1  . FLUoxetine (PROZAC) 10 MG capsule Take 1 capsule (10 mg total) by mouth daily. 90 capsule 1  . Fluticasone-Salmeterol (ADVAIR) 250-50 MCG/DOSE AEPB Inhale 1 puff into the lungs 2 (two) times daily.    Marland Kitchen levothyroxine (SYNTHROID, LEVOTHROID) 50 MCG tablet Take 50 mcg by mouth daily before breakfast.    . losartan (COZAAR) 100 MG tablet Take 100 mg by mouth every morning.     Marland Kitchen LUMIGAN 0.01 % SOLN Place 1 drop into the left eye at bedtime.  4  . memantine (NAMENDA) 10 MG tablet Take 10 mg by mouth 2 (two) times daily.    Marland Kitchen oxybutynin (DITROPAN-XL) 5 MG 24 hr tablet Take 1 tablet (5 mg total) by mouth at bedtime. For urinary incontinence. 90 tablet 0  . triamterene-hydrochlorothiazide (MAXZIDE-25) 37.5-25 MG per tablet Take 1 tablet by mouth every morning.      No facility-administered medications prior to visit.        Allergies:   Codeine and Morphine and related   Social History   Social History  . Marital status: Divorced    Spouse name: N/A  . Number of children: 1  . Years of education: Some college   Occupational History  . Retired    Social History Main Topics  . Smoking status: Never Smoker  . Smokeless tobacco: Never Used  . Alcohol use No  . Drug use: No  . Sexual activity: No   Other Topics Concern  . None   Social History Narrative   Lives w/ duaghter and son-in-law   Caffeine ZOX:WRUE      Family History:  The patient's family history includes Diabetes in her mother; Heart failure in her mother; Hyperlipidemia in her mother; Hypertension in her  mother; Melanoma in her grandchild.   ROS:   Please see the history of present illness.    ROS All other systems reviewed and are negative.   PHYSICAL EXAM:   VS:  BP 130/62   Pulse 79   Ht 5\' 8"  (1.727 m)   Wt 68.5 kg (151 lb)   BMI 22.96 kg/m    GEN: Well nourished, well developed, in no acute distress  HEENT: normal  Neck: no JVD, carotid bruits, or masses Cardiac: RRR; no murmurs, rubs, or gallops,no edema, healthy left subclavian pacemaker site  Respiratory:  clear to auscultation bilaterally, normal work of breathing GI: soft, nontender, nondistended, + BS MS: no deformity or atrophy  Skin: warm and dry, no rash Neuro:  Alert and Oriented x 3, Strength and sensation are intact Psych: euthymic mood, full affect  Wt Readings from Last 3 Encounters:  01/11/17 68.5 kg (151 lb)  12/16/16 68.9 kg (152 lb)  12/16/16 68.2 kg (150 lb 6.4 oz)      Studies/Labs Reviewed:   EKG:  EKG is ordered today.  The ekg ordered today demonstrates Sinus rhythm, PACS, right bundle branch block, left anterior fascicular block  CXR:  12/16/2016 1. Cardiac pacer in stable position. Prior CABG and cardiac valve replacement. Borderline cardiomegaly. No pulmonary venous congestion. 2. Small bilateral pleural effusions versus pleural  scarring again noted . No acute pulmonary disease noted.  Carotid US: 12/24/2016, mild plaque  Labs: 05/25/2016 Total cholesterol 137, triglycerides 107, HDL 60, LDL 56, LDL particle number 578, LDL size 20.5 (borderline) Hemoglobin 13.9, creatinine 1.01  ASSESSMENT:    1. Mobitz type 2 second degree AV block   2. Pacemaker   3. PAT (paroxysmal atrial tachycardia) (HCC)   4. Coronary artery disease involving native coronary artery of native heart without angina pectoris   5. Pure hypercholesterolemia   6. S/P AVR 2009 [facility] Ease 21 mm bovine pericardial   7. MVP (mitral valve prolapse) with mild mitral insufficiency   8. Abdominal aortic aneurysm (AAA) without rupture (HCC)   9. Late onset Alzheimer's disease without behavioral disturbance   10. Cough      PLAN:  In order of problems listed above:  1. Second-degree AV block, Mobitz type II: No syncope since device implantation. She had up to 9.5 second pauses due to second-degree AV block. However, she requires very infrequent ventricular pacing, less than 1%. 2. Pacemaker: Normal pacemaker function, no changes made today. Follow-up via remote [facility] downloads every 3 months 3. Atrial tachycardia is infrequent and asymptomatic. No specific therapy recommended today. Might be related to treatment with beta agonist medications for reactive airway disease. Atrial fibrillation has not been documented 4. CAD s/p CABG: Asymptomatic. No angina. On statin.  5. HLP: Very favorable lipid profile 6. S/P AVR (2009, 21 mm [facility] Ease bovine bioprosthesis): Although I think her cough has nothing to do with valvular disease, it's reasonable to reevaluate the prosthesis since it's been 3 years since we last performed an echo in March 2015 with normal valve gradients and no evidence of insufficiency. (Peak 28, mean 17 mm Hg).  7. MVP/MR: Not hemodynamically significant 8. AAA: Unchanged diameter 3.4x3.6 cm, most recent ultrasound  evaluation August 2017 9. Alzheimer's disease: Progressing very slowly 10. Cough: This appears to be related to a viral infection, possibly even allergies. There is no evidence of congestive heart failure in her chest x-ray. It seems to be improving spontaneously.   Medication Adjustments/Labs and Tests Ordered:  Current medicines are reviewed at length with the patient today.  Concerns regarding medicines are outlined above.  Medication changes, Labs and Tests ordered today are listed in the Patient Instructions below. Patient Instructions  Dr Royann Shivers recommends that you continue on your current medications as directed. Please refer to the Current Medication list given to you today.  Your physician has requested that you have an echocardiogram. Echocardiography is a painless test that uses sound waves to create images of your heart. It provides your doctor with information about the size and shape of your heart and how well your heart's chambers and valves are working. This procedure takes approximately one hour. There are no restrictions for this procedure. This will be performed at our [facility] location - [address].  Remote monitoring is used to monitor your Pacemaker of ICD from home. This monitoring reduces the number of office visits required to check your device to one time per year. It allows [place] to keep an eye on the functioning of your device to ensure it is working properly. You are scheduled for a device check from home on Tuesday, June 19th, 2018. You may send your transmission at any time that day. If you have a wireless device, the transmission will be sent automatically. After your physician reviews your transmission, you will receive a postcard with your next transmission date.  Dr Royann Shivers recommends that you schedule a follow-up appointment in 12 months with a pacemaker check. You will receive a reminder letter in the mail two months in advance. If you don't  receive a letter, please call our office to schedule the follow-up appointment.  If you need a refill on your cardiac medications before your next appointment, please call your pharmacy.    Signed, [name], MD  01/15/2017 1:34 PM    [facility] [address] [place], [place], [place]  [address] Phone: [phone]; Fax: [phone]

## 2017-01-11 NOTE — Patient Instructions (Signed)
Dr Royann Shivers recommends that you continue on your current medications as directed. Please refer to the Current Medication list given to you today.  Your physician has requested that you have an echocardiogram. Echocardiography is a painless test that uses sound waves to create images of your heart. It provides your doctor with information about the size and shape of your heart and how well your heart's chambers and valves are working. This procedure takes approximately one hour. There are no restrictions for this procedure. This will be performed at our [facility] location - [address], [address].  Remote monitoring is used to monitor your Pacemaker of ICD from home. This monitoring reduces the number of office visits required to check your device to one time per year. It allows [place] to keep an eye on the functioning of your device to ensure it is working properly. You are scheduled for a device check from home on Tuesday, June 19th, 2018. You may send your transmission at any time that day. If you have a wireless device, the transmission will be sent automatically. After your physician reviews your transmission, you will receive a postcard with your next transmission date.  Dr Royann Shivers recommends that you schedule a follow-up appointment in 12 months with a pacemaker check. You will receive a reminder letter in the mail two months in advance. If you don't receive a letter, please call our office to schedule the follow-up appointment.  If you need a refill on your cardiac medications before your next appointment, please call your pharmacy.

## 2017-01-13 LAB — CUP PACEART INCLINIC DEVICE CHECK
Battery Impedance: 109 Ohm
Brady Statistic AP VS Percent: 25 %
Brady Statistic AS VS Percent: 75 %
Date Time Interrogation Session: 20180320125237
Implantable Lead Implant Date: 20160112
Implantable Lead Location: 753859
Implantable Lead Location: 753860
Lead Channel Impedance Value: 531 Ohm
Lead Channel Pacing Threshold Amplitude: 0.75 V
Lead Channel Pacing Threshold Pulse Width: 0.4 ms
Lead Channel Setting Pacing Amplitude: 2 V
Lead Channel Setting Pacing Pulse Width: 0.4 ms
Lead Channel Setting Sensing Sensitivity: 2.8 mV
MDC IDC LEAD IMPLANT DT: 20160112
MDC IDC MSMT BATTERY REMAINING LONGEVITY: 172 mo
MDC IDC MSMT BATTERY VOLTAGE: 2.79 V
MDC IDC MSMT LEADCHNL RA IMPEDANCE VALUE: 486 Ohm
MDC IDC MSMT LEADCHNL RA PACING THRESHOLD AMPLITUDE: 0.625 V
MDC IDC MSMT LEADCHNL RA PACING THRESHOLD PULSEWIDTH: 0.4 ms
MDC IDC PG IMPLANT DT: 20160112
MDC IDC SET LEADCHNL RA PACING AMPLITUDE: 1.5 V
MDC IDC STAT BRADY AP VP PERCENT: 0 %
MDC IDC STAT BRADY AS VP PERCENT: 0 %

## 2017-01-15 DIAGNOSIS — Z950 Presence of cardiac pacemaker: Secondary | ICD-10-CM | POA: Insufficient documentation

## 2017-01-17 ENCOUNTER — Encounter: Payer: Self-pay | Admitting: Primary Care

## 2017-01-17 DIAGNOSIS — F0390 Unspecified dementia without behavioral disturbance: Secondary | ICD-10-CM

## 2017-01-17 MED ORDER — MEMANTINE HCL 10 MG PO TABS: 10.0000 mg | ORAL_TABLET | Freq: Two times a day (BID) | ORAL | 1 refills | Status: DC

## 2017-01-24 ENCOUNTER — Encounter: Payer: Self-pay | Admitting: Primary Care

## 2017-01-24 ENCOUNTER — Other Ambulatory Visit: Payer: Self-pay | Admitting: Primary Care

## 2017-01-24 DIAGNOSIS — F0390 Unspecified dementia without behavioral disturbance: Secondary | ICD-10-CM

## 2017-01-24 MED ORDER — DONEPEZIL HCL 10 MG PO TABS: 10.0000 mg | ORAL_TABLET | Freq: Every day | ORAL | 2 refills | Status: DC

## 2017-01-26 ENCOUNTER — Ambulatory Visit (HOSPITAL_COMMUNITY): Payer: Medicare Other | Attending: Internal Medicine

## 2017-01-26 ENCOUNTER — Other Ambulatory Visit: Payer: Self-pay

## 2017-01-26 DIAGNOSIS — Z952 Presence of prosthetic heart valve: Secondary | ICD-10-CM

## 2017-01-26 DIAGNOSIS — I510 Cardiac septal defect, acquired: Secondary | ICD-10-CM | POA: Insufficient documentation

## 2017-01-26 DIAGNOSIS — I420 Dilated cardiomyopathy: Secondary | ICD-10-CM | POA: Diagnosis not present

## 2017-01-26 DIAGNOSIS — I501 Left ventricular failure: Secondary | ICD-10-CM | POA: Diagnosis not present

## 2017-01-26 DIAGNOSIS — I361 Nonrheumatic tricuspid (valve) insufficiency: Secondary | ICD-10-CM | POA: Insufficient documentation

## 2017-01-26 DIAGNOSIS — I341 Nonrheumatic mitral (valve) prolapse: Secondary | ICD-10-CM | POA: Insufficient documentation

## 2017-01-28 ENCOUNTER — Telehealth: Payer: Self-pay | Admitting: Cardiovascular Disease

## 2017-01-28 MED ORDER — FUROSEMIDE 40 MG PO TABS: 40.0000 mg | ORAL_TABLET | Freq: Every day | ORAL | 3 refills | Status: DC

## 2017-01-28 NOTE — Telephone Encounter (Signed)
New message   [name] is returning call to [facility]

## 2017-01-28 NOTE — Telephone Encounter (Signed)
Prescription sent to patient's preferred pharmacy electronically. Pt's daughter will start Furosemide tomorrow, 01/29/17. Will monitor pt's weight, breathing, and cough and report findings on Monday.

## 2017-01-28 NOTE — Telephone Encounter (Signed)
Previous note entered in error.  Notes recorded by Neta Ehlers, CMA on 01/28/2017 at 4:38 PM EDT Called patient, spoke with [name] (daughter), okay per DPR. Reviewed results and recommendations. Daughter verbalized understanding and agreed with plan. ------  Notes recorded by Neta Ehlers, CMA on 01/28/2017 at 3:25 PM EDT lmtcb ------  Notes recorded by [name], MD on 01/27/2017 at 8:44 AM EDT Normal heart pumping and valve function. Interestingly, the Doppler does suggest too much fluid on board. Maybe her cough is heart related after all. Please have her take furosemide mg daily for three days over the weekend and touch base with [place] again on Monday with a report of her change in weight, breathing and cough. Eat extra potassium rich foods over the weekend. Call in furosemide 40 mg, #30, but have her just take it for 3 days, save the rest for later. MCr

## 2017-01-28 NOTE — Telephone Encounter (Signed)
Please see other telephone encounter from today's date

## 2017-01-31 ENCOUNTER — Telehealth: Payer: Self-pay | Admitting: Cardiovascular Disease

## 2017-01-31 ENCOUNTER — Other Ambulatory Visit: Payer: Self-pay | Admitting: Primary Care

## 2017-01-31 ENCOUNTER — Encounter: Payer: Self-pay | Admitting: Primary Care

## 2017-01-31 DIAGNOSIS — M810 Age-related osteoporosis without current pathological fracture: Secondary | ICD-10-CM

## 2017-01-31 DIAGNOSIS — M8580 Other specified disorders of bone density and structure, unspecified site: Secondary | ICD-10-CM

## 2017-01-31 MED ORDER — ALENDRONATE SODIUM 70 MG PO TABS: 70.0000 mg | ORAL_TABLET | ORAL | 3 refills | Status: DC

## 2017-01-31 MED ORDER — FUROSEMIDE 40 MG PO TABS: ORAL_TABLET | ORAL | 3 refills | Status: DC

## 2017-01-31 NOTE — Telephone Encounter (Signed)
Is her cough gone? Let's try a maintenance regimen of furosemide 40 mg on Mon, Wed and Friday only. Please continue daily weights. Idea is to keep weight at 145 lb or lower. MCr

## 2017-01-31 NOTE — Telephone Encounter (Signed)
Pt of Dr. Royann Shivers See weights below  Daughter reporting pt's symptoms improved and she is now 6 lbs down from her prior weight on Friday. (Notes 1/2 lb gain between sat & sun she attributes to large meal Saturday night)  Reports they took the 3rd dose of lasix this morning. Was advised not to repeat this dosing unless instructed, (use for 3 days then d/c and hold onto bottle for later, per Dr. Royann Shivers.) Informed her I would make physician aware of status, see if anything further advised at this time or to just continue to monitor.

## 2017-01-31 NOTE — Telephone Encounter (Signed)
Returned call to daughter  Reports cough gone, agreeable to maintenance dosing. I've advised her to weigh daily consistently in AM after void, keep 40mg  lasix at maintenance of 3 days a week MWF, treat 145 lbs as preferred max weight, call if 3 lb gain in 24hrs or 5 lb gain in week so that we can guide on dosing if needed.  She voiced understanding and thanks.

## 2017-01-31 NOTE — Telephone Encounter (Signed)
New message   Pt daughter is calling to inform RN of pt weight, she was told to call on Friday today.   Sat-147.5  Sun-148 Today-145

## 2017-02-03 NOTE — Telephone Encounter (Signed)
I spoke with [name] to get pt scheduled for Dexa bone density scan. Her last scan was on 06/12/15 so she is not due at this time. [name] will fax over results from that scan today.

## 2017-02-08 ENCOUNTER — Telehealth: Payer: Self-pay | Admitting: Cardiovascular Disease

## 2017-02-08 ENCOUNTER — Encounter: Payer: Self-pay | Admitting: Primary Care

## 2017-02-08 DIAGNOSIS — I10 Essential (primary) hypertension: Secondary | ICD-10-CM

## 2017-02-08 MED ORDER — TRIAMTERENE-HCTZ 37.5-25 MG PO TABS: 0.5000 | ORAL_TABLET | Freq: Every morning | ORAL | 3 refills | Status: DC

## 2017-02-08 NOTE — Telephone Encounter (Signed)
Returned the call to the patient's daughter, per DPR. She stated that the patient's goal weight was 145 (per previous notes). Her mother's weight has been 148/149 the last six days and then this morning it was 150. She has been taking the Furosemide 40 mg M-W-F. She was wondering if 148/149 should be her goal weight. She stated that her mother does not have shortness of breath or edema in legs. She did state that she coughs at night and is sometimes woken up by it. Will defer to the physician.

## 2017-02-08 NOTE — Telephone Encounter (Signed)
OK, let's set 150 as the trigger for more furosemide MCr

## 2017-02-08 NOTE — Telephone Encounter (Signed)
Please call,she needs to give you an update on pt's weight gain.

## 2017-02-08 NOTE — Telephone Encounter (Signed)
Called the patient's daughter back with Dr. Erin Hearing instructions:  let's set 150 as the trigger for more furosemide  The patient's daughter stated that she understood and would call back if her mother gained 3 lb in 24hrs or a 5 lb gain in one week over the 150 lbs.

## 2017-02-21 ENCOUNTER — Telehealth: Payer: Self-pay | Admitting: Cardiovascular Disease

## 2017-02-21 NOTE — Telephone Encounter (Signed)
Please make it a daily routine MCr

## 2017-02-21 NOTE — Telephone Encounter (Signed)
Called the patient's daughter back with Dr. Erin Hearing instructions to make weighing pt a daily routine, she verbalizes agreement and will call if gained3 lb in 24hrs or a 5 lb gain in oneweek over the 150 lbs

## 2017-02-21 NOTE — Telephone Encounter (Signed)
New Message  Pts daughter voiced wanting to know if she needs to weigh patient everyday.  Please f/u

## 2017-02-21 NOTE — Telephone Encounter (Signed)
Kathryn Mariscal, RN  02/08/17 2:04 PM  Note    Called the patient's daughter back with Dr. Erin Hearing instructions:  let's set 150 as the trigger for more furosemide  The patient's daughter stated that she understood and would call back if her mother gained 3 lb in 24hrs or a 5 lb gain in one week over the 150 lbs.      Communicated above info to daughter, that patient should weigh every day She states her mother averages 148.5 lbs  Advised that is would be best to weigh daily, since lasix may or may not be adjusted based on weight gain and how quickly that weight is gained.  She states it is difficult to weigh her mother every day - daughter wants to know if this is needed. Will send to MD for advice

## 2017-03-26 ENCOUNTER — Other Ambulatory Visit: Payer: Self-pay | Admitting: Primary Care

## 2017-03-26 DIAGNOSIS — R32 Unspecified urinary incontinence: Secondary | ICD-10-CM

## 2017-03-28 NOTE — Telephone Encounter (Signed)
Ok to refill? Electronically refill request for oxybutynin (DITROPAN-XL) 5 MG 24 hr tablet.  Last prescribed on 12/27/2016.  Last seen on 12/16/2016

## 2017-03-30 ENCOUNTER — Other Ambulatory Visit: Payer: Self-pay | Admitting: Primary Care

## 2017-03-30 DIAGNOSIS — R32 Unspecified urinary incontinence: Secondary | ICD-10-CM

## 2017-04-04 ENCOUNTER — Other Ambulatory Visit: Payer: Self-pay | Admitting: Cardiovascular Disease

## 2017-04-12 ENCOUNTER — Telehealth: Payer: Self-pay | Admitting: Cardiology

## 2017-04-12 ENCOUNTER — Ambulatory Visit (INDEPENDENT_AMBULATORY_CARE_PROVIDER_SITE_OTHER): Payer: Medicare Other | Admitting: *Deleted

## 2017-04-12 DIAGNOSIS — I441 Atrioventricular block, second degree: Secondary | ICD-10-CM

## 2017-04-12 NOTE — Telephone Encounter (Signed)
LMOVM reminding pt to send remote transmission.   

## 2017-04-14 LAB — CUP PACEART REMOTE DEVICE CHECK
Battery Impedance: 133 Ohm
Battery Remaining Longevity: 165 mo
Brady Statistic AP VP Percent: 0 %
Brady Statistic AP VS Percent: 26 %
Implantable Lead Implant Date: 20160112
Implantable Lead Location: 753859
Implantable Lead Model: 5076
Implantable Pulse Generator Implant Date: 20160112
Lead Channel Impedance Value: 486 Ohm
Lead Channel Setting Pacing Amplitude: 1.5 V
Lead Channel Setting Pacing Amplitude: 2 V
Lead Channel Setting Pacing Pulse Width: 0.4 ms
Lead Channel Setting Sensing Sensitivity: 2 mV
MDC IDC LEAD IMPLANT DT: 20160112
MDC IDC LEAD LOCATION: 753860
MDC IDC MSMT BATTERY VOLTAGE: 2.79 V
MDC IDC MSMT LEADCHNL RA PACING THRESHOLD AMPLITUDE: 0.5 V
MDC IDC MSMT LEADCHNL RA PACING THRESHOLD PULSEWIDTH: 0.4 ms
MDC IDC MSMT LEADCHNL RV IMPEDANCE VALUE: 529 Ohm
MDC IDC MSMT LEADCHNL RV PACING THRESHOLD AMPLITUDE: 0.75 V
MDC IDC MSMT LEADCHNL RV PACING THRESHOLD PULSEWIDTH: 0.4 ms
MDC IDC SESS DTM: 20180620023330
MDC IDC STAT BRADY AS VP PERCENT: 0 %
MDC IDC STAT BRADY AS VS PERCENT: 74 %

## 2017-04-14 NOTE — Progress Notes (Signed)
Remote pacemaker transmission.   

## 2017-04-15 ENCOUNTER — Encounter: Payer: Self-pay | Admitting: Cardiology

## 2017-04-29 ENCOUNTER — Encounter: Payer: Self-pay | Admitting: Primary Care

## 2017-05-17 ENCOUNTER — Ambulatory Visit (INDEPENDENT_AMBULATORY_CARE_PROVIDER_SITE_OTHER): Payer: Medicare Other | Admitting: Primary Care

## 2017-05-17 ENCOUNTER — Encounter: Payer: Self-pay | Admitting: Primary Care

## 2017-05-17 DIAGNOSIS — R05 Cough: Secondary | ICD-10-CM | POA: Diagnosis not present

## 2017-05-17 DIAGNOSIS — R053 Chronic cough: Secondary | ICD-10-CM

## 2017-05-17 NOTE — Assessment & Plan Note (Signed)
Improved on Zyrtec until three weeks ago. Compliant to [employer]viar. Cough today sounds congested, however, lungs are clear upon exam. She does not appear acutely ill. Will have her try Mucinex DM daily for the next 1 week, also start Zantac once to twice daily. Daughter will update if no improvement.

## 2017-05-17 NOTE — Progress Notes (Signed)
Subjective:    Patient ID: [name], female    DOB: 1934-06-18, 83 y.o.   MRN: [id number]  HPI  [name] is an 83 year old female with a history of COPD, dementia, hypertension who presents today with a chief complaint of cough. [name] is with Kathryn today to help provide information for Kathryn HPI.  Kathryn cough has been present for the past three weeks. During Kathryn last visit it was recommended she start Zyrtec for chronic cough. She started the Zyrtec which helped significantly with Kathryn cough up until the last three weeks.   She's compliant to Kathryn Advair twice daily and is using Kathryn albuterol several times weekly. Kathryn cough is productive but not bringing up much. She denies fevers, sore throat. She's been taking cough drops, Mucinex DM x 1 dose without improvement, and Hycodan cough syrup from [name]'s RX with improvement. Overall she feels well.  Review of Systems  Constitutional: Negative for chills, fatigue and fever.  HENT: Positive for congestion. Negative for sinus pressure and sore throat.   Respiratory: Positive for cough. Negative for shortness of breath and wheezing.   Cardiovascular: Negative for chest pain.       Past Medical History:  Diagnosis Date  . AAA (abdominal aortic aneurysm) (HCC) 01/03/13   3.5x3.3cm  . Alzheimer's dementia    "probably middle stage" (11/05/2014)  . Anemia    "hx of chronic" (11/05/2014)  . Aortic stenosis 03/29/2008   biological prosthetic replacement  . Arthritis    "joints" (11/05/2014)  . Asthma   . Chronic asthmatic bronchitis (HCC)    "q fall and winter" (11/05/2014)  . Coronary artery disease    CABG 03/29/08  . Dyslipidemia   . Exertional shortness of breath   . Family history of adverse reaction to anesthesia    "Stanton gets PONV too"  . Heart murmur   . Hypertension   . Hypothyroidism   . LBBB (left bundle branch block)   . Migraines    "none in years' (11/05/2014)  . Mobitz type 2 second degree AV block  10/31/2014  . MVP (mitral valve prolapse)    with mild mitral insufficiency/notes 08/17/2013  . PONV (postoperative nausea and vomiting)   . Presence of permanent cardiac pacemaker   . Sinus pause 10/31/2014  . Stroke [facility]) 03/2008   "2 light strokes"; denies residual on 11/05/2014  . Urinary frequency      Social History   Social History  . Marital status: Divorced    Spouse name: N/A  . Number of children: 1  . Years of education: Some college   Occupational History  . Retired    Social History Main Topics  . Smoking status: Never Smoker  . Smokeless tobacco: Never Used  . Alcohol use No  . Drug use: No  . Sexual activity: No   Other Topics Concern  . Not on file   Social History Narrative   Lives w/ duaghter and son-in-law   Caffeine WUJ:WJXB     Past Surgical History:  Procedure Laterality Date  . ABDOMINAL HYSTERECTOMY  1977  . AORTIC VALVE REPLACEMENT  03/29/2008   PERICARDIAL TISSUE VALVE  . APPENDECTOMY  ?1977  . CARDIAC CATHETERIZATION  ~ 2009  . CATARACT EXTRACTION W/ INTRAOCULAR LENS IMPLANT Left   . CORONARY ARTERY BYPASS GRAFT  03/29/2008   LIMA TO LAD,SVG TO CX,SVG TO LEFT POSTEROLATERAL BRANCH  . HIP FRACTURE SURGERY Right 06/2010   "3 metal screws"   .  INSERT / REPLACE / REMOVE PACEMAKER  11/05/2014  . LOOP RECORDER EXPLANT N/A 11/05/2014   Procedure: LOOP RECORDER EXPLANT;  Surgeon: [name], MD;  Location: [facility] CATH LAB;  Service: Cardiovascular;  Laterality: N/A;  . LOOP RECORDER IMPLANT N/A 09/17/2013   Procedure: LOOP RECORDER IMPLANT;  Surgeon: [name], MD;  Location: [facility] CATH LAB;  Service: Cardiovascular;  Laterality: N/A;  . PERMANENT PACEMAKER INSERTION N/A 11/05/2014   Procedure: PERMANENT PACEMAKER INSERTION;  Surgeon: [name], MD;  Location: [facility] CATH LAB;  Service: Cardiovascular;  Laterality: N/A;  . REFRACTIVE SURGERY Bilateral    [name] 04/28/2008 (08/17/2013)  . RETINAL DETACHMENT SURGERY Left 1994   [name] 04/10/2008  (08/17/2013)  . TIBIAL TUBERCLERPLASTY  11/05/2014  . UTERINE FIBROID SURGERY  1960's    Family History  Problem Relation Age of Onset  . Heart failure Mother   . Diabetes Mother   . Hypertension Mother   . Hyperlipidemia Mother   . Melanoma Grandchild   . Colon cancer Neg Hx     Allergies  Allergen Reactions  . Codeine Nausea And Vomiting and Other (See Comments)    Patient gets violently ill  . Morphine And Related Nausea And Vomiting and Other (See Comments)    Patient get violently ill    Current Outpatient Prescriptions on File Prior to Visit  Medication Sig Dispense Refill  . albuterol (PROVENTIL HFA;VENTOLIN HFA) 108 (90 Base) MCG/ACT inhaler Inhale 1 puff into the lungs every 6 (six) hours as needed for wheezing or shortness of breath.    Marland Kitchen alendronate (FOSAMAX) 70 MG tablet Take 1 tablet (70 mg total) by mouth once a week. On Sundays. Take with a full glass of water on an empty stomach. 12 tablet 3  . aspirin 81 MG chewable tablet Chew 162 mg by mouth every morning.     Marland Kitchen atorvastatin (LIPITOR) 40 MG tablet Take 1 tablet (40 mg total) by mouth daily. 90 tablet 1  . donepezil (ARICEPT) 10 MG tablet Take 1 tablet (10 mg total) by mouth at bedtime. 90 tablet 2  . FLUoxetine (PROZAC) 10 MG capsule Take 1 capsule (10 mg total) by mouth daily. 90 capsule 1  . Fluticasone-Salmeterol (ADVAIR) 250-50 MCG/DOSE AEPB Inhale 1 puff into the lungs 2 (two) times daily.    . furosemide (LASIX) 40 MG tablet TAKE 1 TABLET BY MOUTH EVERY DAY 30 tablet 3  . levothyroxine (SYNTHROID, LEVOTHROID) 50 MCG tablet Take 50 mcg by mouth daily before breakfast.    . losartan (COZAAR) 100 MG tablet Take 100 mg by mouth every morning.     Marland Kitchen LUMIGAN 0.01 % SOLN Place 1 drop into the left eye at bedtime.  4  . memantine (NAMENDA) 10 MG tablet Take 1 tablet (10 mg total) by mouth 2 (two) times daily. 180 tablet 1  . oxybutynin (DITROPAN-XL) 5 MG 24 hr tablet TAKE 1 TABLET (5 MG TOTAL) BY MOUTH AT BEDTIME.  FOR URINARY INCONTINENCE. 90 tablet 1  . triamterene-hydrochlorothiazide (MAXZIDE-25) 37.5-25 MG tablet Take 0.5 tablets by mouth every morning. 45 tablet 3  . amoxicillin (AMOXIL) 500 MG tablet Take 4 tablets by mouth as directed.  1   No current facility-administered medications on file prior to visit.     BP 140/68   Pulse 60   Temp 98.6 F (37 C) (Oral)   Ht 5\' 8"  (1.727 m)   Wt 146 lb 12.8 oz (66.6 kg)   SpO2 98%   BMI 22.32 kg/m  Objective:   Physical Exam  Constitutional: She appears well-nourished.  HENT:  Right Ear: Tympanic membrane and ear canal normal.  Left Ear: Tympanic membrane and ear canal normal.  Nose: Right sinus exhibits no maxillary sinus tenderness and no frontal sinus tenderness. Left sinus exhibits no maxillary sinus tenderness and no frontal sinus tenderness.  Mouth/Throat: Oropharynx is clear and moist.  Eyes: Conjunctivae are normal.  Neck: Neck supple.  Cardiovascular: Normal rate and regular rhythm.   Pulmonary/Chest: Effort normal and breath sounds normal. She has no wheezes. She has no rales.  Hacking cough during exam, infrequent  Lymphadenopathy:    She has no cervical adenopathy.  Skin: Skin is warm and dry.          Assessment & Plan:

## 2017-05-17 NOTE — Patient Instructions (Signed)
Start ranitidine (Zantac) tablets for acid reflux. Take 1 tablet by mouth once daily, may increase to twice daily if needed.  Cough/Congestion: Try taking Mucinex DM. This will help loosen up the mucous in your chest. Ensure you take this medication with a full glass of water.  Please call me if you start to run fevers, feel worse, start coughing up green mucous.  It was a pleasure to see you today!

## 2017-06-03 ENCOUNTER — Other Ambulatory Visit: Payer: Self-pay | Admitting: Primary Care

## 2017-06-03 ENCOUNTER — Encounter: Payer: Self-pay | Admitting: Primary Care

## 2017-06-03 ENCOUNTER — Ambulatory Visit (INDEPENDENT_AMBULATORY_CARE_PROVIDER_SITE_OTHER): Payer: Medicare Other | Admitting: Primary Care

## 2017-06-03 ENCOUNTER — Ambulatory Visit (INDEPENDENT_AMBULATORY_CARE_PROVIDER_SITE_OTHER)
Admission: RE | Admit: 2017-06-03 | Discharge: 2017-06-03 | Disposition: A | Discharge status: Home / Self Care | Payer: Medicare Other | Source: Ambulatory Visit | Attending: Primary Care | Admitting: Primary Care

## 2017-06-03 VITALS — BP 140/64 | HR 68 | Temp 98.2°F | Ht 68.0 in | Wt 145.8 lb

## 2017-06-03 DIAGNOSIS — E039 Hypothyroidism, unspecified: Secondary | ICD-10-CM | POA: Diagnosis not present

## 2017-06-03 DIAGNOSIS — M8580 Other specified disorders of bone density and structure, unspecified site: Secondary | ICD-10-CM | POA: Diagnosis not present

## 2017-06-03 DIAGNOSIS — R053 Chronic cough: Secondary | ICD-10-CM

## 2017-06-03 DIAGNOSIS — M25551 Pain in right hip: Secondary | ICD-10-CM

## 2017-06-03 DIAGNOSIS — Z0000 Encounter for general adult medical examination without abnormal findings: Secondary | ICD-10-CM | POA: Diagnosis not present

## 2017-06-03 DIAGNOSIS — R32 Unspecified urinary incontinence: Secondary | ICD-10-CM | POA: Diagnosis not present

## 2017-06-03 DIAGNOSIS — I2510 Atherosclerotic heart disease of native coronary artery without angina pectoris: Secondary | ICD-10-CM | POA: Diagnosis not present

## 2017-06-03 DIAGNOSIS — I714 Abdominal aortic aneurysm, without rupture, unspecified: Secondary | ICD-10-CM

## 2017-06-03 DIAGNOSIS — E785 Hyperlipidemia, unspecified: Secondary | ICD-10-CM

## 2017-06-03 DIAGNOSIS — F0390 Unspecified dementia without behavioral disturbance: Secondary | ICD-10-CM

## 2017-06-03 DIAGNOSIS — I10 Essential (primary) hypertension: Secondary | ICD-10-CM

## 2017-06-03 DIAGNOSIS — J449 Chronic obstructive pulmonary disease, unspecified: Secondary | ICD-10-CM

## 2017-06-03 DIAGNOSIS — M810 Age-related osteoporosis without current pathological fracture: Secondary | ICD-10-CM

## 2017-06-03 DIAGNOSIS — F3342 Major depressive disorder, recurrent, in full remission: Secondary | ICD-10-CM

## 2017-06-03 DIAGNOSIS — G8929 Other chronic pain: Secondary | ICD-10-CM

## 2017-06-03 DIAGNOSIS — R05 Cough: Secondary | ICD-10-CM

## 2017-06-03 LAB — TSH: TSH: 0.88 mIU/L

## 2017-06-03 MED ORDER — FLUOXETINE HCL 10 MG PO CAPS: 10.0000 mg | ORAL_CAPSULE | Freq: Every day | ORAL | 3 refills | Status: DC

## 2017-06-03 MED ORDER — ALENDRONATE SODIUM 70 MG PO TABS: 70.0000 mg | ORAL_TABLET | ORAL | 3 refills | Status: DC

## 2017-06-03 MED ORDER — OXYBUTYNIN CHLORIDE ER 5 MG PO TB24: 5.0000 mg | ORAL_TABLET | Freq: Every day | ORAL | 3 refills | Status: DC

## 2017-06-03 MED ORDER — ATORVASTATIN CALCIUM 40 MG PO TABS: ORAL_TABLET | ORAL | 3 refills | Status: DC

## 2017-06-03 MED ORDER — FLUTICASONE-SALMETEROL 250-50 MCG/DOSE IN AEPB: 1.0000 | INHALATION_SPRAY | Freq: Two times a day (BID) | RESPIRATORY_TRACT | 11 refills | Status: DC

## 2017-06-03 MED ORDER — DONEPEZIL HCL 10 MG PO TABS: ORAL_TABLET | ORAL | 3 refills | Status: DC

## 2017-06-03 MED ORDER — TRIAMTERENE-HCTZ 37.5-25 MG PO TABS: 0.5000 | ORAL_TABLET | Freq: Every morning | ORAL | 3 refills | Status: DC

## 2017-06-03 NOTE — Assessment & Plan Note (Signed)
Followed by cardiology 

## 2017-06-03 NOTE — Assessment & Plan Note (Signed)
Improved with Mucinex x 2 weeks. Compliant to Advair and Zantac.

## 2017-06-03 NOTE — Progress Notes (Signed)
Patient ID: [name], female   DOB: [birth year], 83 y.o.   MRN: [id number]  HPI: [name] is an 83 year old female who presents today for her annual wellness visit.  Past Medical History:  Diagnosis Date  . AAA (abdominal aortic aneurysm) (HCC) 01/03/13   3.5x3.3cm  . Alzheimer's dementia    "probably middle stage" (11/05/2014)  . Anemia    "hx of chronic" (11/05/2014)  . Aortic stenosis 03/29/2008   biological prosthetic replacement  . Arthritis    "joints" (11/05/2014)  . Asthma   . Chronic asthmatic bronchitis (HCC)    "q fall and winter" (11/05/2014)  . Coronary artery disease    CABG 03/29/08  . Dyslipidemia   . Exertional shortness of breath   . Family history of adverse reaction to anesthesia    "daughter gets PONV too"  . Heart murmur   . Hypertension   . Hypothyroidism   . LBBB (left bundle branch block)   . Migraines    "none in years' (11/05/2014)  . Mobitz type 2 second degree AV block 10/31/2014  . MVP (mitral valve prolapse)    with mild mitral insufficiency/notes 08/17/2013  . PONV (postoperative nausea and vomiting)   . Presence of permanent cardiac pacemaker   . Sinus pause 10/31/2014  . Stroke [facility]) 03/2008   "2 light strokes"; denies residual on 11/05/2014  . Urinary frequency     Current Outpatient Prescriptions  Medication Sig Dispense Refill  . albuterol (PROVENTIL HFA;VENTOLIN HFA) 108 (90 Base) MCG/ACT inhaler Inhale 1 puff into the lungs every 6 (six) hours as needed for wheezing or shortness of breath.    Marland Kitchen alendronate (FOSAMAX) 70 MG tablet Take 1 tablet (70 mg total) by mouth once a week. On Sundays. Take with a full glass of water on an empty stomach. 12 tablet 3  . aspirin 81 MG chewable tablet Chew 162 mg by mouth every morning.     Marland Kitchen atorvastatin (LIPITOR) 40 MG tablet Take 1 tablet (40 mg total) by mouth daily. 90 tablet 1  . donepezil (ARICEPT) 10 MG tablet Take 1 tablet (10 mg total) by mouth at bedtime. 90 tablet 2  . FLUoxetine (PROZAC) 10  MG capsule Take 1 capsule (10 mg total) by mouth daily. 90 capsule 1  . Fluticasone-Salmeterol (ADVAIR) 250-50 MCG/DOSE AEPB Inhale 1 puff into the lungs 2 (two) times daily.    . furosemide (LASIX) 40 MG tablet TAKE 1 TABLET BY MOUTH EVERY DAY 30 tablet 3  . levothyroxine (SYNTHROID, LEVOTHROID) 50 MCG tablet Take 50 mcg by mouth daily before breakfast.    . losartan (COZAAR) 100 MG tablet Take 100 mg by mouth every morning.     Marland Kitchen LUMIGAN 0.01 % SOLN Place 1 drop into the left eye at bedtime.  4  . memantine (NAMENDA) 10 MG tablet Take 1 tablet (10 mg total) by mouth 2 (two) times daily. 180 tablet 1  . oxybutynin (DITROPAN-XL) 5 MG 24 hr tablet TAKE 1 TABLET (5 MG TOTAL) BY MOUTH AT BEDTIME. FOR URINARY INCONTINENCE. 90 tablet 1  . triamterene-hydrochlorothiazide (MAXZIDE-25) 37.5-25 MG tablet Take 0.5 tablets by mouth every morning. 45 tablet 3  . amoxicillin (AMOXIL) 500 MG tablet Take 4 tablets by mouth as directed.  1   No current facility-administered medications for this visit.     Allergies  Allergen Reactions  . Codeine Nausea And Vomiting and Other (See Comments)    Patient gets violently ill  . Morphine And Related  Nausea And Vomiting and Other (See Comments)    Patient get violently ill    Family History  Problem Relation Age of Onset  . Heart failure Mother   . Diabetes Mother   . Hypertension Mother   . Hyperlipidemia Mother   . Melanoma Grandchild   . Colon cancer Neg Hx     Social History   Social History  . Marital status: Divorced    Spouse name: N/A  . Number of children: 1  . Years of education: Some college   Occupational History  . Retired    Social History Main Topics  . Smoking status: Never Smoker  . Smokeless tobacco: Never Used  . Alcohol use No  . Drug use: No  . Sexual activity: No   Other Topics Concern  . Not on file   Social History Narrative   Lives w/ duaghter and son-in-law   Caffeine VWU:[id number]     Hospitiliaztions:  None  Health Maintenance:    Flu: Due this season  Tetanus: Completed in 2012  Pneumovax: Completed in 2011  Prevnar: Completed in 2015  Zostavax: Completed in 2008  Bone Density: Pending. History of osteoporosis  Colonoscopy: Completed in 2006, declines given age.  Eye Doctor: Completed four times annually. Last visit in June 2018  Dental Exam: Completes semi-annually.   Mammogram: Completed in 2017. Declines given ago.   Pap: Hysterectomy      Providers: [name], PCP; Dr. Royann Shivers, Cardiology; Dr. Anne Hahn, Neurology; Dr. Dagoberto Ligas; Opthalmology    I have personally reviewed and have noted: 1. The patient's medical and social history 2. Their use of alcohol, tobacco or illicit drugs 3. Their current medications and supplements 4. The patient's functional ability including ADL's, fall risks, home safety  risks and hearing or visual impairment. 5. Diet and physical activities 6. Evidence for depression or mood disorder  Subjective:   Review of Systems:   Constitutional: Denies fever, malaise, fatigue, headache or abrupt weight changes.  HEENT: Denies eye pain, eye redness, ear pain, ringing in the ears, wax buildup, runny nose, nasal congestion, bloody nose, or sore throat. Respiratory: Denies difficulty breathing, shortness of breath. Cough and congestion have improved.    Cardiovascular: Denies chest pain, chest tightness, palpitations or swelling in the hands or feet.  Gastrointestinal: Denies abdominal pain, bloating, constipation, diarrhea or blood in the stool.  GU: Denies urgency, frequency, pain with urination, burning sensation, blood in urine, odor or discharge. Stable incontinence. Musculoskeletal: Denies decrease in range of motion,  muscle pain or joint pain and swelling. Uses her cane in public. She does experience hip pain, history of surgery with three screws.  Skin: Denies redness, rashes, lesions or ulcercations.  Neurological: Denies dizziness,  difficulty with memory, difficulty with speech or problems with balance and coordination. Daughter reports patient moving things in the house, then forgetting. Also leaves food in the microwave thinking that she's eaten it. Short term memory is worse. Has a Scientist, research (medical) with her three days weekly.  Psychiatric: Denies concerns for anxiety or depression.   No other specific complaints in a complete review of systems (except as listed in HPI above).  Objective:  PE:   There were no vitals taken for this visit. Wt Readings from Last 3 Encounters:  05/17/17 146 lb 12.8 oz (66.6 kg)  01/11/17 151 lb (68.5 kg)  12/16/16 152 lb (68.9 kg)    General: Appears their stated age, well developed, well nourished in NAD. Skin: Warm, dry and intact. No  rashes, lesions or ulcerations noted. HEENT: Head: normal shape and size; Eyes: sclera white, no icterus, conjunctiva pink, PERRLA and EOMs intact; Ears: Tm's gray and intact, normal light reflex; Nose: mucosa pink and moist, septum midline; Throat/Mouth: Teeth present, mucosa pink and moist, no exudate, lesions or ulcerations noted.  Neck: Normal range of motion. Neck supple, trachea midline. No massses, lumps or thyromegaly present.  Cardiovascular: Normal rate and rhythm. S1,S2 noted.  No murmur, rubs or gallops noted. No JVD or BLE edema. No carotid bruits noted. Pulmonary/Chest: Normal effort and positive vesicular breath sounds. No respiratory distress. No wheezes, rales or ronchi noted.  Abdomen: Soft and nontender. Normal bowel sounds, no bruits noted. No distention or masses noted. Liver, spleen and kidneys non palpable. Musculoskeletal:. No signs of joint swelling. Ambulates well in office with cane.   Neurological: Alert, oriented to some questions today, cannot recall short term memory questions. Cannot recall her diet. Cranial nerves II-XII intact. Coordination normal. +DTRs bilaterally. Psychiatric: Mood and affect normal. Behavior is  normal.    BMET    Component Value Date/Time   NA 137 10/29/2014 1048   K 3.5 10/29/2014 1048   CL 95 (L) 10/29/2014 1048   CO2 30 10/29/2014 1048   GLUCOSE 130 (H) 10/29/2014 1048   BUN 12 10/29/2014 1048   CREATININE 0.97 10/29/2014 1048   CALCIUM 8.7 10/29/2014 1048   GFRNONAA 46 (L) 08/18/2013 0410   GFRAA 53 (L) 08/18/2013 0410    Lipid Panel     Component Value Date/Time   CHOL  04/10/2008 0500    94        ATP III CLASSIFICATION:  <200     mg/dL   Desirable  161-096  mg/dL   Borderline High  >=045    mg/dL   High   TRIG 77 40/98/1191 0500   HDL 26 (L) 04/10/2008 0500   CHOLHDL 3.6 04/10/2008 0500   VLDL 15 04/10/2008 0500   LDLCALC  04/10/2008 0500    53        Total Cholesterol/HDL:CHD Risk Coronary Heart Disease Risk Table                     Men   Women  1/2 Average Risk   3.4   3.3    CBC    Component Value Date/Time   WBC 7.1 10/29/2014 1048   RBC 4.50 10/29/2014 1048   HGB 14.0 10/29/2014 1048   HCT 41.1 10/29/2014 1048   PLT 169 10/29/2014 1048   MCV 91.3 10/29/2014 1048   MCH 31.1 10/29/2014 1048   MCHC 34.1 10/29/2014 1048   RDW 13.0 10/29/2014 1048   LYMPHSABS 1.1 08/17/2013 1346   MONOABS 0.5 08/17/2013 1346   EOSABS 0.0 08/17/2013 1346   BASOSABS 0.0 08/17/2013 1346    Hgb A1C Lab Results  Component Value Date   HGBA1C  04/10/2008    5.4 (NOTE)   The ADA recommends the following therapeutic goals for glycemic   control related to Hgb A1C measurement:   Goal of Therapy:   < 7.0% Hgb A1C   Action Suggested:  > 8.0% Hgb A1C   Ref:  Diabetes Care, 22, Suppl. 1, 1999      Assessment and Plan:   Medicare Annual Wellness Visit:  Diet: She endorses a healthy diet. Breakfast: Cereal, fruit, recently muffins, sometimes poundcake Lunch: Lefts overs, fast food, restaurants (salad, veggies, soups).  Dinner: Meat, vegetable, starch, hot dogs, veggies, starch Snacks: Cake,  graham crackers and peanut butter, nuts Desserts:  Daily Beverages: Diet Coke, some water, coffee Physical activity: Active in her home, does not routinely exercise. Depression/mood screen: Negative Hearing: Intact to whispered voice Visual acuity: Grossly normal, performs annual eye exam  ADLs: Capable Fall risk: None Home safety: Good Cognitive evaluation: Intact to orientation, naming, recall and repetition EOL planning: Adv directives, full code.   Preventative Medicine: Immunizations UTD. Declines colonoscopy and mammogram given age. Bone Density test pending. Advanced directives in place, daughter is [name]. Exam stable given history of Alzheimer's disease. Labs pending. All recommendations provided at end of visit.   Next appointment: 6 months for follow up.

## 2017-06-03 NOTE — Assessment & Plan Note (Signed)
Immunizations UTD. Declines colonoscopy and mammogram given age. Bone Density test pending. Advanced directives in place, daughter is [name]. Exam stable given history of Alzheimer's disease. Labs pending. All recommendations provided at end of visit.   I have personally reviewed and have noted: 1. The patient's medical and social history 2. Their use of alcohol, tobacco or illicit drugs 3. Their current medications and supplements 4. The patient's functional ability including ADL's, fall  risks, home safety risks and hearing or visual  impairment. 5. Diet and physical activities 6. Evidence for depression or mood disorder

## 2017-06-03 NOTE — Assessment & Plan Note (Signed)
Stable in the office today, continue current regimen. 

## 2017-06-03 NOTE — Assessment & Plan Note (Signed)
Continue Fosamax, bone density pending.

## 2017-06-03 NOTE — Assessment & Plan Note (Signed)
Lipid panel pending. Continue atorvastatin. 

## 2017-06-03 NOTE — Patient Instructions (Addendum)
Wean off of memantine (Namenda) for dementia. Start by taking 1/2 tablet daily for 7 days, then 1/2 tablet once daily for 7 days, then stop.  Continue donepezil (Aricept) as prescribed. Update me if you have any problems with either medications.  I sent refills to the pharmacy.  Complete lab work and xray prior to leaving today. I will notify you of your results once received.   Only use the albuterol inhaler as needed for wheezing and/or shortness of breath.  Call to schedule the Bone Density scan.  Please check on the medication called losartan for blood pressure.   I will fill the levothyroxine once I receive your thyroid function test.  Follow up in 6 months for re-evaluation.  It was a pleasure to see you today!

## 2017-06-03 NOTE — Assessment & Plan Note (Signed)
Compliant to Advair daily. Daughter reports cough and congestion are much improved with Mucinex. Reminded patient to use albuterol PRN for wheezing/shortness of breath.

## 2017-06-03 NOTE — Assessment & Plan Note (Signed)
Overall stable on oxybutynin. Refill provided today.

## 2017-06-03 NOTE — Assessment & Plan Note (Addendum)
Following with cardiology annually. Managed on statin and aspirin.

## 2017-06-03 NOTE — Assessment & Plan Note (Signed)
Due for repeat TSH, pending. Will refill levothyroxine once lab returns.

## 2017-06-03 NOTE — Assessment & Plan Note (Signed)
Doing well on Prozac. Also doing better since volunteer sitter has been coming three times weekly.

## 2017-06-03 NOTE — Assessment & Plan Note (Signed)
Appears to be progressing. Failed three item recall and diet recall today. Long discussion regarding trajectory of Alzheimer's with daughter and patient today. Daughter doesn't see any improvement with Aricept or Namenda, patient also complains about the numerous medications that she's taking. Will trial a wean off of Namenda, continue Aricept. If no noticeable difference then will consider weaning off Aricept. Daughter will update through My Chart.

## 2017-06-04 LAB — LIPID PANEL
CHOLESTEROL: 143 mg/dL (ref <200)
HDL: 75 mg/dL (ref >50)
LDL CALC: 54 mg/dL (ref <100)
Total CHOL/HDL Ratio: 1.9 Ratio (ref <5.0)
Triglycerides: 70 mg/dL (ref <150)
VLDL: 14 mg/dL (ref <30)

## 2017-06-04 LAB — COMPREHENSIVE METABOLIC PANEL
ALBUMIN: 3.9 g/dL (ref 3.6–5.1)
ALK PHOS: 75 U/L (ref 33–130)
ALT: 16 U/L (ref 6–29)
AST: 20 U/L (ref 10–35)
BILIRUBIN TOTAL: 0.9 mg/dL (ref 0.2–1.2)
BUN: 16 mg/dL (ref 7–25)
CALCIUM: 9.4 mg/dL (ref 8.6–10.4)
CO2: 31 mmol/L (ref 20–32)
Chloride: 92 mmol/L — ABNORMAL LOW (ref 98–110)
Creat: 1.08 mg/dL — ABNORMAL HIGH (ref 0.60–0.88)
GLUCOSE: 82 mg/dL (ref 65–99)
Potassium: 3.7 mmol/L (ref 3.5–5.3)
Sodium: 133 mmol/L — ABNORMAL LOW (ref 135–146)
TOTAL PROTEIN: 6.3 g/dL (ref 6.1–8.1)

## 2017-06-05 ENCOUNTER — Other Ambulatory Visit: Payer: Self-pay | Admitting: Primary Care

## 2017-06-05 DIAGNOSIS — E039 Hypothyroidism, unspecified: Secondary | ICD-10-CM

## 2017-06-05 MED ORDER — LEVOTHYROXINE SODIUM 50 MCG PO TABS: ORAL_TABLET | ORAL | 3 refills | Status: DC

## 2017-06-08 ENCOUNTER — Encounter: Payer: Self-pay | Admitting: Primary Care

## 2017-06-10 ENCOUNTER — Telehealth: Payer: Self-pay | Admitting: Cardiovascular Disease

## 2017-06-10 NOTE — Telephone Encounter (Signed)
Returned the phone call to the patient's daughter, per the DPR. She stated that she received a letter from vascular to schedule a AAA duplex. She did not know about this so would like confirmation from Dr. Royann Shivers.  Per Dr. Sharlet Salina he would like for the patient to go ahead and have this test done. The daughter verbalized her understanding.  A message has been sent to scheduling.

## 2017-06-10 NOTE — Telephone Encounter (Signed)
[name] ( Daughter) is calling to see if Dr. Royann Shivers  Is wanting her mom to have a AAA . Please call

## 2017-07-04 ENCOUNTER — Other Ambulatory Visit: Payer: Self-pay | Admitting: Cardiovascular Disease

## 2017-07-04 DIAGNOSIS — I714 Abdominal aortic aneurysm, without rupture, unspecified: Secondary | ICD-10-CM

## 2017-07-12 ENCOUNTER — Encounter: Payer: Medicare Other | Admitting: *Deleted

## 2017-07-12 ENCOUNTER — Telehealth: Payer: Self-pay | Admitting: Cardiology

## 2017-07-12 NOTE — Telephone Encounter (Signed)
Confirmed remote transmission w/ pt daughter.   

## 2017-07-15 ENCOUNTER — Encounter: Payer: Self-pay | Admitting: Cardiology

## 2017-07-15 ENCOUNTER — Encounter: Payer: Self-pay | Admitting: Cardiovascular Disease

## 2017-07-15 NOTE — Progress Notes (Signed)
Letter  

## 2017-07-18 ENCOUNTER — Ambulatory Visit (INDEPENDENT_AMBULATORY_CARE_PROVIDER_SITE_OTHER): Payer: Medicare Other | Admitting: *Deleted

## 2017-07-18 DIAGNOSIS — I441 Atrioventricular block, second degree: Secondary | ICD-10-CM | POA: Diagnosis not present

## 2017-07-18 NOTE — Progress Notes (Signed)
Remote pacemaker transmission.   

## 2017-07-19 LAB — CUP PACEART REMOTE DEVICE CHECK
Battery Impedance: 133 Ohm
Brady Statistic AP VP Percent: 0 %
Brady Statistic AP VS Percent: 27 %
Brady Statistic AS VS Percent: 72 %
Date Time Interrogation Session: 20180923004233
Implantable Lead Implant Date: 20160112
Implantable Lead Location: 753859
Implantable Lead Location: 753860
Implantable Lead Model: 5076
Lead Channel Impedance Value: 554 Ohm
Lead Channel Pacing Threshold Pulse Width: 0.4 ms
Lead Channel Pacing Threshold Pulse Width: 0.4 ms
Lead Channel Setting Pacing Amplitude: 2 V
Lead Channel Setting Pacing Pulse Width: 0.4 ms
Lead Channel Setting Sensing Sensitivity: 2 mV
MDC IDC LEAD IMPLANT DT: 20160112
MDC IDC MSMT BATTERY REMAINING LONGEVITY: 164 mo
MDC IDC MSMT BATTERY VOLTAGE: 2.79 V
MDC IDC MSMT LEADCHNL RA IMPEDANCE VALUE: 486 Ohm
MDC IDC MSMT LEADCHNL RA PACING THRESHOLD AMPLITUDE: 0.625 V
MDC IDC MSMT LEADCHNL RV PACING THRESHOLD AMPLITUDE: 0.875 V
MDC IDC PG IMPLANT DT: 20160112
MDC IDC SET LEADCHNL RA PACING AMPLITUDE: 1.5 V
MDC IDC STAT BRADY AS VP PERCENT: 0 %

## 2017-07-20 ENCOUNTER — Ambulatory Visit (HOSPITAL_COMMUNITY)
Admission: RE | Admit: 2017-07-20 | Discharge: 2017-07-20 | Disposition: A | Discharge status: Home / Self Care | Payer: Medicare Other | Source: Ambulatory Visit | Attending: Cardiology | Admitting: Cardiology

## 2017-07-20 DIAGNOSIS — I714 Abdominal aortic aneurysm, without rupture, unspecified: Secondary | ICD-10-CM

## 2017-07-20 DIAGNOSIS — I700 Atherosclerosis of aorta: Secondary | ICD-10-CM | POA: Diagnosis not present

## 2017-07-20 LAB — HM DEXA SCAN

## 2017-07-21 ENCOUNTER — Encounter: Payer: Self-pay | Admitting: Cardiology

## 2017-07-27 ENCOUNTER — Encounter: Payer: Self-pay | Admitting: Primary Care

## 2017-08-08 ENCOUNTER — Other Ambulatory Visit: Payer: Self-pay | Admitting: Primary Care

## 2017-08-11 ENCOUNTER — Other Ambulatory Visit: Payer: Self-pay | Admitting: *Deleted

## 2017-08-11 MED ORDER — FUROSEMIDE 40 MG PO TABS: 40.0000 mg | ORAL_TABLET | Freq: Every day | ORAL | 2 refills | Status: DC

## 2017-08-15 ENCOUNTER — Other Ambulatory Visit: Payer: Self-pay | Admitting: *Deleted

## 2017-08-15 DIAGNOSIS — I714 Abdominal aortic aneurysm, without rupture, unspecified: Secondary | ICD-10-CM

## 2017-09-26 ENCOUNTER — Encounter: Payer: Self-pay | Admitting: Primary Care

## 2017-09-26 NOTE — Telephone Encounter (Signed)
Spoken to [place]. She stated that on Saturday, patient was laying in bed and noticed her head hurts a little. Patient's son in law saw some red marks but no bumps or anything. Then on Sunday, patient's daughter was combing her hair and patient complain that it was painful. Daughter was wondering may be fallen and did not realized that she did or may be from sleeping in the bed for a long period of time.  I went ahead and schedule an office visit for patient on 09/28/17 at 12:15 pm

## 2017-09-26 NOTE — Telephone Encounter (Signed)
Will you please call [name]? Thanks.

## 2017-09-26 NOTE — Telephone Encounter (Signed)
Noted. Will need to evaluate. Thanks.

## 2017-09-28 ENCOUNTER — Ambulatory Visit: Payer: Medicare Other | Admitting: Primary Care

## 2017-09-28 ENCOUNTER — Encounter: Payer: Self-pay | Admitting: Primary Care

## 2017-09-28 VITALS — BP 124/64 | HR 65 | Temp 98.2°F | Ht 68.0 in | Wt 154.0 lb

## 2017-09-28 DIAGNOSIS — B029 Zoster without complications: Secondary | ICD-10-CM

## 2017-09-28 MED ORDER — GABAPENTIN 100 MG PO CAPS: ORAL_CAPSULE | ORAL | 0 refills | Status: DC

## 2017-09-28 MED ORDER — VALACYCLOVIR HCL 1 G PO TABS: 1000.0000 mg | ORAL_TABLET | Freq: Three times a day (TID) | ORAL | 0 refills | Status: DC

## 2017-09-28 NOTE — Patient Instructions (Signed)
Start valacyclovir 1 g tablets for shingles. Take 1 tablet by mouth three times daily for 7 days.  You can take gabapentin as needed for nerve pain. Take 1-3 capsules by mouth three times daily as needed. Caution as this may cause drowsiness.  Please don't hesitate to message me with any questions.  It was a pleasure to see you today!

## 2017-09-28 NOTE — Progress Notes (Signed)
Subjective:    Patient ID: [name], female    DOB: July 21, 1934, 83 y.o.   MRN: [id number]  HPI  [name] is an 83 year old female scalp tenderness. The scalp tenderness began four days ago and is located to the right parietal and temporal region. Her son in law noticed red bumps at that time, no rash. Her daughter was combing her hair the following day and the patient was complaining of pain during this. Her daughter saw some puffiness to the right upper head and scalp, but no rash. Two days ago she complained of increased pain to the scalp and now right lateral neck. Yesterday her family started to noticed a rash with bumps.   The patient reports moderate to severe tenderness to her scalp on the right side, anytime it is touched.  She is taken Tylenol without any improvement in pain.  She has not applied anything topically to her scalp.  Review of Systems  Skin: Positive for color change and rash.  Neurological:       Right scalp tenderness       Past Medical History:  Diagnosis Date  . AAA (abdominal aortic aneurysm) (HCC) 01/03/13   3.5x3.3cm  . Alzheimer's dementia    "probably middle stage" (11/05/2014)  . Anemia    "hx of chronic" (11/05/2014)  . Aortic stenosis 03/29/2008   biological prosthetic replacement  . Arthritis    "joints" (11/05/2014)  . Asthma   . Chronic asthmatic bronchitis (HCC)    "q fall and winter" (11/05/2014)  . Coronary artery disease    CABG 03/29/08  . Dyslipidemia   . Exertional shortness of breath   . Family history of adverse reaction to anesthesia    "daughter gets PONV too"  . Heart murmur   . Hypertension   . Hypothyroidism   . LBBB (left bundle branch block)   . Migraines    "none in years' (11/05/2014)  . Mobitz type 2 second degree AV block 10/31/2014  . MVP (mitral valve prolapse)    with mild mitral insufficiency/notes 08/17/2013  . PONV (postoperative nausea and vomiting)   . Presence of permanent cardiac pacemaker   . Sinus  pause 10/31/2014  . Stroke [facility]) 03/2008   "2 light strokes"; denies residual on 11/05/2014  . Urinary frequency      Social History   Socioeconomic History  . Marital status: Divorced    Spouse name: Not on file  . Number of children: 1  . Years of education: Some college  . Highest education level: Not on file  Social Needs  . Financial resource strain: Not on file  . Food insecurity - worry: Not on file  . Food insecurity - inability: Not on file  . Transportation needs - medical: Not on file  . Transportation needs - non-medical: Not on file  Occupational History  . Occupation: Retired  Tobacco Use  . Smoking status: Never Smoker  . Smokeless tobacco: Never Used  Substance and Sexual Activity  . Alcohol use: No    Alcohol/week: 0.0 oz  . Drug use: No  . Sexual activity: No  Other Topics Concern  . Not on file  Social History Narrative   Lives w/ duaghter and son-in-law   Caffeine YBW:LSLH     Past Surgical History:  Procedure Laterality Date  . ABDOMINAL HYSTERECTOMY  1977  . AORTIC VALVE REPLACEMENT  03/29/2008   PERICARDIAL TISSUE VALVE  . APPENDECTOMY  ?1977  . CARDIAC CATHETERIZATION  ~  2009  . CATARACT EXTRACTION W/ INTRAOCULAR LENS IMPLANT Left   . CORONARY ARTERY BYPASS GRAFT  03/29/2008   LIMA TO LAD,SVG TO CX,SVG TO LEFT POSTEROLATERAL BRANCH  . HIP FRACTURE SURGERY Right 06/2010   "3 metal screws"   . INSERT / REPLACE / REMOVE PACEMAKER  11/05/2014  . LOOP RECORDER EXPLANT N/A 11/05/2014   Procedure: LOOP RECORDER EXPLANT;  Surgeon: [name], MD;  Location: [facility] CATH LAB;  Service: Cardiovascular;  Laterality: N/A;  . LOOP RECORDER IMPLANT N/A 09/17/2013   Procedure: LOOP RECORDER IMPLANT;  Surgeon: [name], MD;  Location: [facility] CATH LAB;  Service: Cardiovascular;  Laterality: N/A;  . PERMANENT PACEMAKER INSERTION N/A 11/05/2014   Procedure: PERMANENT PACEMAKER INSERTION;  Surgeon: [name], MD;  Location: [facility] CATH LAB;  Service: Cardiovascular;   Laterality: N/A;  . REFRACTIVE SURGERY Bilateral    [name] 04/28/2008 (08/17/2013)  . RETINAL DETACHMENT SURGERY Left 1994   [name] 04/10/2008 (08/17/2013)  . TIBIAL TUBERCLERPLASTY  11/05/2014  . UTERINE FIBROID SURGERY  1960's    Family History  Problem Relation Age of Onset  . Heart failure Mother   . Diabetes Mother   . Hypertension Mother   . Hyperlipidemia Mother   . Melanoma Grandchild   . Colon cancer Neg Hx     Allergies  Allergen Reactions  . Codeine Nausea And Vomiting and Other (See Comments)    Patient gets violently ill  . Morphine And Related Nausea And Vomiting and Other (See Comments)    Patient get violently ill    Current Outpatient Medications on File Prior to Visit  Medication Sig Dispense Refill  . albuterol (PROVENTIL HFA;VENTOLIN HFA) 108 (90 Base) MCG/ACT inhaler Inhale 1 puff into the lungs every 6 (six) hours as needed for wheezing or shortness of breath.    Marland Kitchen alendronate (FOSAMAX) 70 MG tablet Take 1 tablet (70 mg total) by mouth once a week. On Sundays. Take with a full glass of water on an empty stomach. For osteoporosis. 12 tablet 3  . amoxicillin (AMOXIL) 500 MG tablet Take 4 tablets by mouth as directed.  1  . aspirin 81 MG chewable tablet Chew 162 mg by mouth every morning.     Marland Kitchen atorvastatin (LIPITOR) 40 MG tablet Take 1 tablet by mouth every evening for cholesterol. 90 tablet 3  . donepezil (ARICEPT) 10 MG tablet Take 1 tablet by mouth every night at bedtime for memory. 90 tablet 3  . FLUoxetine (PROZAC) 10 MG capsule Take 1 capsule (10 mg total) by mouth daily. For anxiety and depression 90 capsule 3  . Fluticasone-Salmeterol (ADVAIR) 250-50 MCG/DOSE AEPB Inhale 1 puff into the lungs 2 (two) times daily. For COPD 60 each 11  . furosemide (LASIX) 40 MG tablet Take 1 tablet (40 mg total) by mouth daily. 90 tablet 2  . levothyroxine (SYNTHROID, LEVOTHROID) 50 MCG tablet Take 1 tablet by mouth on an empty stomach with a full glass of water. 90 tablet  3  . losartan (COZAAR) 100 MG tablet TAKE 1 TABLET EVERY MORNING 90 tablet 1  . LUMIGAN 0.01 % SOLN Place 1 drop into the left eye at bedtime.  4  . oxybutynin (DITROPAN-XL) 5 MG 24 hr tablet Take 1 tablet (5 mg total) by mouth at bedtime. For urinary incontinence. 90 tablet 3  . ranitidine (ZANTAC 150 MAXIMUM STRENGTH) 150 MG tablet Take 150 mg by mouth 2 (two) times daily.     Marland Kitchen triamterene-hydrochlorothiazide (MAXZIDE-25) 37.5-25 MG tablet Take 0.5 tablets  by mouth every morning. For high blood pressure 45 tablet 3   No current facility-administered medications on file prior to visit.     BP 124/64   Pulse 65   Temp 98.2 F (36.8 C) (Oral)   Ht 5\' 8"  (1.727 m)   Wt 154 lb (69.9 kg)   SpO2 98%   BMI 23.42 kg/m    Objective:   Physical Exam  Constitutional: She appears well-nourished.  Neck: Neck supple.  Cardiovascular: Normal rate and regular rhythm.  Pulmonary/Chest: Effort normal and breath sounds normal.  Skin: Skin is warm and dry. Rash noted.  Raised, erythematous rashes representative of herpes zoster located to right parietal and temporal scalp, right lateral neck.  No ophthalmic involvement.  Scalp tender upon palpation.  No erythema or rash noted to left side.          Assessment & Plan:  Herpes Zoster:  Symptoms and examination classic for herpes zoster.  No ophthalmic involvement. Prescription for Valtrex 1 g 3 times daily times 7 days sent to pharmacy. Discussed options for pain management, will trial gabapentin.  Start with 1-3 capsules 3 times daily as needed, drowsiness precautions provided. Discussed patient will be contagious if vesicles were to rupture. Follow-up as needed.

## 2017-09-30 ENCOUNTER — Ambulatory Visit: Payer: Medicare Other | Admitting: Primary Care

## 2017-10-14 ENCOUNTER — Telehealth: Payer: Self-pay | Admitting: Primary Care

## 2017-10-14 DIAGNOSIS — B029 Zoster without complications: Secondary | ICD-10-CM

## 2017-10-14 MED ORDER — GABAPENTIN 100 MG PO CAPS: ORAL_CAPSULE | ORAL | 0 refills | Status: DC

## 2017-10-14 NOTE — Telephone Encounter (Signed)
Pt returning Rena's call. Let her know Artelia Laroche would call back once talking with Mayra Reel.

## 2017-10-14 NOTE — Telephone Encounter (Signed)
[name] pts daughter said that it is not the shingles vaccine causing pain; pt was seen 09/28/17 with shingles; the rash has scabbed over with slight redness now but head and neck hurting at pain level 10. Pt finished the valtrex and pt taking gabapentin 2-2-3. Pt is out of gabapentin today.Michele request cb with what to do for pain. [facility].

## 2017-10-14 NOTE — Telephone Encounter (Signed)
Copied from CRM [id number]. Topic: Quick Communication - See Telephone Encounter >> Oct 14, 2017  8:08 AM Louie Bun, Rosey Bath D wrote: CRM for notification. See Telephone encounter for: 10/14/17. Tressie Ellis called daughter of patient and she would like a call back regarding her mom's shingle medication. Patient is still complaining of pain and is not sure what to do about it. Please call [name] [phone] back, thanks.

## 2017-10-14 NOTE — Telephone Encounter (Signed)
The new shingles vaccination will cause pain, this is an expected side effect and should improve within 2-3 days. She can put ice or heat on to the site for discomfort. She can also take Tylenol or Ibuprofen as needed for pain.

## 2017-10-14 NOTE — Telephone Encounter (Signed)
Left v/m for [name] to cb to [facility].will fwd note to [name] NP for further instruction from [facility] note. Pt seen 09/28/17.

## 2017-10-14 NOTE — Telephone Encounter (Signed)
Lm on [name]'s vm requesting a call back

## 2017-10-14 NOTE — Telephone Encounter (Signed)
Spoke with [name], refill for gabapentin sent to pharmacy.

## 2017-10-19 ENCOUNTER — Ambulatory Visit (INDEPENDENT_AMBULATORY_CARE_PROVIDER_SITE_OTHER): Payer: Medicare Other | Admitting: *Deleted

## 2017-10-19 ENCOUNTER — Telehealth: Payer: Self-pay | Admitting: Cardiology

## 2017-10-19 ENCOUNTER — Encounter: Payer: Self-pay | Admitting: Primary Care

## 2017-10-19 DIAGNOSIS — I441 Atrioventricular block, second degree: Secondary | ICD-10-CM | POA: Diagnosis not present

## 2017-10-19 DIAGNOSIS — B029 Zoster without complications: Secondary | ICD-10-CM

## 2017-10-19 NOTE — Telephone Encounter (Signed)
LMOVM reminding pt to send remote transmission.   

## 2017-10-20 ENCOUNTER — Encounter: Payer: Self-pay | Admitting: Cardiology

## 2017-10-20 ENCOUNTER — Encounter: Payer: Self-pay | Admitting: Cardiovascular Disease

## 2017-10-20 LAB — CUP PACEART REMOTE DEVICE CHECK
Battery Voltage: 2.79 V
Brady Statistic AP VS Percent: 28 %
Brady Statistic AS VP Percent: 0 %
Date Time Interrogation Session: 20181227111131
Implantable Lead Implant Date: 20160112
Implantable Lead Implant Date: 20160112
Implantable Lead Location: 753859
Lead Channel Impedance Value: 480 Ohm
Lead Channel Impedance Value: 529 Ohm
Lead Channel Pacing Threshold Amplitude: 0.875 V
Lead Channel Pacing Threshold Pulse Width: 0.4 ms
Lead Channel Pacing Threshold Pulse Width: 0.4 ms
Lead Channel Setting Pacing Amplitude: 1.5 V
Lead Channel Setting Pacing Amplitude: 2 V
Lead Channel Setting Sensing Sensitivity: 2 mV
MDC IDC LEAD LOCATION: 753860
MDC IDC MSMT BATTERY IMPEDANCE: 133 Ohm
MDC IDC MSMT BATTERY REMAINING LONGEVITY: 164 mo
MDC IDC MSMT LEADCHNL RA PACING THRESHOLD AMPLITUDE: 0.5 V
MDC IDC MSMT LEADCHNL RA SENSING INTR AMPL: 2.8 mV
MDC IDC MSMT LEADCHNL RV SENSING INTR AMPL: 5.6 mV
MDC IDC PG IMPLANT DT: 20160112
MDC IDC SET LEADCHNL RV PACING PULSEWIDTH: 0.4 ms
MDC IDC STAT BRADY AP VP PERCENT: 0 %
MDC IDC STAT BRADY AS VS PERCENT: 72 %

## 2017-10-20 NOTE — Progress Notes (Signed)
Remote pacemaker transmission.   

## 2017-10-24 ENCOUNTER — Other Ambulatory Visit: Payer: Self-pay | Admitting: Primary Care

## 2017-10-24 DIAGNOSIS — B029 Zoster without complications: Secondary | ICD-10-CM

## 2017-10-24 MED ORDER — GABAPENTIN 100 MG PO CAPS: ORAL_CAPSULE | ORAL | 0 refills | Status: DC

## 2017-10-24 MED ORDER — GABAPENTIN 300 MG PO CAPS: ORAL_CAPSULE | ORAL | 0 refills | Status: DC

## 2017-11-03 ENCOUNTER — Ambulatory Visit (INDEPENDENT_AMBULATORY_CARE_PROVIDER_SITE_OTHER): Payer: Medicare Other | Admitting: Nurse Practitioner

## 2017-11-03 ENCOUNTER — Ambulatory Visit: Payer: Self-pay | Admitting: *Deleted

## 2017-11-03 ENCOUNTER — Ambulatory Visit (INDEPENDENT_AMBULATORY_CARE_PROVIDER_SITE_OTHER): Payer: Medicare Other

## 2017-11-03 ENCOUNTER — Encounter: Payer: Self-pay | Admitting: Nurse Practitioner

## 2017-11-03 VITALS — BP 150/70 | HR 65 | Temp 97.9°F | Ht 68.0 in | Wt 161.0 lb

## 2017-11-03 DIAGNOSIS — W19XXXA Unspecified fall, initial encounter: Secondary | ICD-10-CM

## 2017-11-03 DIAGNOSIS — R404 Transient alteration of awareness: Secondary | ICD-10-CM

## 2017-11-03 DIAGNOSIS — G44319 Acute post-traumatic headache, not intractable: Secondary | ICD-10-CM | POA: Diagnosis not present

## 2017-11-03 DIAGNOSIS — M25552 Pain in left hip: Secondary | ICD-10-CM

## 2017-11-03 DIAGNOSIS — S79912A Unspecified injury of left hip, initial encounter: Secondary | ICD-10-CM | POA: Diagnosis not present

## 2017-11-03 NOTE — Telephone Encounter (Signed)
Daughter noticed that her mother had black eye last night when she got home from work-  Her mother is Alzheimer patient and she does not remember falling or even hurting it. Cleaning crew did report she had black eye when they arrived. No damage to her eye glasses and no apparent cuts or injuries.( Only complaint is pain when medication for shingles wears off.) Patient's daughter wants to know if she needs to bring her in to be checked at the office- or at the eye doctor. Per protocol- patient does need to be seen- will ley provider decide if eye doctor is more appropriate.Please call her- [phone].  Reason for Disposition . Patient is confused or is an unreliable provider of information (e.g., dementia, profound mental retardation, alcohol intoxication)  Answer Assessment - Initial Assessment Questions 1. MECHANISM: "How did the injury happen?"      unknown 2. ONSET: "When did the injury happen?" (Minutes or hours ago)      Yesterday between 7:45 and 2:00- the cleaning people can- and they noticed it when they arrived. 3. LOCATION: "What part of the eye is injured?" (cornea, sclera, eyelid, or periorbital tissue)     Left eye- purple on eyelid to cheek bone- slight puffiness that has subsided this morning 4. APPEARANCE: "What does the eye look like?"      Normal appearance on interior- patient has glaucoma and she get drops nightly- that is when daughter examined eye 5. VISION: "Is the vision blurred?"      Patient wears glasses and has glucoma 6. PAIN: "Is it painful?" If so, ask: "How bad is the pain?"   (e.g., Scale 1-10; or mild, moderate, severe)     No pain reported with eye- patient can't tell how injury happened 7. SIZE: For cuts, bruises, or swelling, ask: "How large is it?" (e.g., inches or centimeters)      No cuts- bruising present, eyelid to cheek bone 8. TETANUS: For any breaks in the skin, ask: "When was the last tetanus booster?"     n/a 9. CONTACTS: "Do you wear  contacts?"     n/a- glasses only- no damage to glasses- patient may not have been wearing when injury happened 10. OTHER SYMPTOMS: "Do you have any other symptoms?" (e.g., headache, neck pain, vomiting)       No complaints 11. PREGNANCY: "Is there any chance you are pregnant?" "When was your last menstrual period?"       n/a  Protocols used: EYE INJURY-A-AH

## 2017-11-03 NOTE — Patient Instructions (Addendum)
Hold maxzide and continue losartan. F/up with pcp in 1-2weeks. Pending urinalysis  Use walker to minimize falls.  Need to increase home care to daily

## 2017-11-03 NOTE — Progress Notes (Signed)
Subjective:  Patient ID: [name], female    DOB: 17-May-1934  Age: 84 y.o. MRN: [id number]  CC: Bleeding/Bruising (bruises on left hip an eye--1 day ago fall---fall 3 times within 1 mo---no sitter 3 day a week--child home on weekend. )  Fall  The accident occurred 12 to 24 hours ago. The fall occurred in unknown circumstances. She fell from an unknown height. The point of impact was the head, left hip and left shoulder. The pain is present in the head, left hip and left shoulder. The pain is mild. The symptoms are aggravated by pressure on injury. Pertinent negatives include no abdominal pain, fever, headaches, numbness, tingling, visual change or vomiting. She has tried nothing for the symptoms.  Hip Pain   The incident occurred 12 to 24 hours ago. The incident occurred at home. The injury mechanism was a fall. The pain is present in the left hip. The pain is mild. Pertinent negatives include no numbness or tingling. She reports no foreign bodies present. The symptoms are aggravated by palpation. She has tried rest for the symptoms.   Accompanied by daughter who provided all the information. [name] does not remember how or when or where she fell.  Outpatient Medications Prior to Visit  Medication Sig Dispense Refill  . albuterol (PROVENTIL HFA;VENTOLIN HFA) 108 (90 Base) MCG/ACT inhaler Inhale 1 puff into the lungs every 6 (six) hours as needed for wheezing or shortness of breath.    Marland Kitchen alendronate (FOSAMAX) 70 MG tablet Take 1 tablet (70 mg total) by mouth once a week. On Sundays. Take with a full glass of water on an empty stomach. For osteoporosis. 12 tablet 3  . amoxicillin (AMOXIL) 500 MG tablet Take 4 tablets by mouth as directed.  1  . aspirin 81 MG chewable tablet Chew 162 mg by mouth every morning.     Marland Kitchen atorvastatin (LIPITOR) 40 MG tablet Take 1 tablet by mouth every evening for cholesterol. 90 tablet 3  . donepezil (ARICEPT) 10 MG tablet Take 1 tablet by mouth every night  at bedtime for memory. 90 tablet 3  . FLUoxetine (PROZAC) 10 MG capsule Take 1 capsule (10 mg total) by mouth daily. For anxiety and depression 90 capsule 3  . Fluticasone-Salmeterol (ADVAIR) 250-50 MCG/DOSE AEPB Inhale 1 puff into the lungs 2 (two) times daily. For COPD 60 each 11  . furosemide (LASIX) 40 MG tablet Take 1 tablet (40 mg total) by mouth daily. 90 tablet 2  . gabapentin (NEURONTIN) 300 MG capsule Take 1 capsule by mouth every morning and afternoon, take 2 capsules by mouth at bedtime. 120 capsule 0  . levothyroxine (SYNTHROID, LEVOTHROID) 50 MCG tablet Take 1 tablet by mouth on an empty stomach with a full glass of water. 90 tablet 3  . losartan (COZAAR) 100 MG tablet TAKE 1 TABLET EVERY MORNING 90 tablet 1  . LUMIGAN 0.01 % SOLN Place 1 drop into the left eye at bedtime.  4  . oxybutynin (DITROPAN-XL) 5 MG 24 hr tablet Take 1 tablet (5 mg total) by mouth at bedtime. For urinary incontinence. 90 tablet 3  . ranitidine (ZANTAC 150 MAXIMUM STRENGTH) 150 MG tablet Take 150 mg by mouth 2 (two) times daily.     Marland Kitchen triamterene-hydrochlorothiazide (MAXZIDE-25) 37.5-25 MG tablet Take 0.5 tablets by mouth every morning. For high blood pressure 45 tablet 3  . valACYclovir (VALTREX) 1000 MG tablet Take 1 tablet (1,000 mg total) by mouth 3 (three) times daily. (Patient not taking:  Reported on 11/03/2017) 21 tablet 0   No facility-administered medications prior to visit.     ROS See HPI  Objective:  BP (!) 150/70   Pulse 65   Temp 97.9 F (36.6 C)   Ht 5\' 8"  (1.727 m)   Wt 161 lb (73 kg)   SpO2 98%   BMI 24.48 kg/m   BP Readings from Last 3 Encounters:  11/03/17 (!) 150/70  09/28/17 124/64  06/03/17 140/64    Wt Readings from Last 3 Encounters:  11/03/17 161 lb (73 kg)  09/28/17 154 lb (69.9 kg)  06/03/17 145 lb 12.8 oz (66.1 kg)    Physical Exam  Constitutional: No distress.  HENT:  Head: Head is with contusion. Head is without laceration.    Right Ear: External ear  normal.  Left Ear: External ear normal.  Nose: Nose normal.  Eyes: Conjunctivae and EOM are normal. Pupils are equal, round, and reactive to light.  Neck: Normal range of motion. Neck supple. No thyromegaly present.  Cardiovascular: Normal rate.  Pulmonary/Chest: Effort normal.  Abdominal: Soft. She exhibits no distension. There is no tenderness.  Musculoskeletal: She exhibits tenderness. She exhibits no edema.  Lymphadenopathy:    She has no cervical adenopathy.  Neurological: She is alert.  Alert and oriented to self and daughter  Skin: Skin is warm and dry.  Vitals reviewed.   Lab Results  Component Value Date   WBC 4.6 11/03/2017   HGB 11.9 11/03/2017   HCT 34.5 (L) 11/03/2017   PLT 222 11/03/2017   GLUCOSE 74 11/03/2017   CHOL 143 06/03/2017   TRIG 70 06/03/2017   HDL 75 06/03/2017   LDLCALC 54 06/03/2017   ALT 17 11/03/2017   AST 21 11/03/2017   NA 130 (L) 11/03/2017   K 3.9 11/03/2017   CL 93 (L) 11/03/2017   CREATININE 0.96 (H) 11/03/2017   BUN 16 11/03/2017   CO2 27 11/03/2017   TSH 0.88 06/03/2017   INR 1.00 10/29/2014   HGBA1C  04/10/2008    5.4 (NOTE)   The ADA recommends the following therapeutic goals for glycemic   control related to Hgb A1C measurement:   Goal of Therapy:   < 7.0% Hgb A1C   Action Suggested:  > 8.0% Hgb A1C   Ref:  Diabetes Care, 22, Suppl. 1, 1999    Assessment & Plan:   [name] was seen today for bleeding/bruising.  Diagnoses and all orders for this visit:  Fall, initial encounter -     Urinalysis w microscopic + reflex cultur -     CBC w/Diff -     DG HIP UNILAT WITH PELVIS 2-3 VIEWS LEFT -     CT Head Wo Contrast; Future -     Comprehensive metabolic panel  Acute post-traumatic headache, not intractable -     CT Head Wo Contrast; Future  Pain of left hip joint -     DG HIP UNILAT WITH PELVIS 2-3 VIEWS LEFT  Transient alteration of awareness -     Urinalysis w microscopic + reflex cultur -     CBC w/Diff -     CT  Head Wo Contrast; Future -     Comprehensive metabolic panel   I have discontinued [name]'s triamterene-hydrochlorothiazide. I am also having her maintain her aspirin, amoxicillin, LUMIGAN, albuterol, ranitidine, alendronate, atorvastatin, donepezil, FLUoxetine, Fluticasone-Salmeterol, oxybutynin, levothyroxine, losartan, furosemide, valACYclovir, and gabapentin.  No orders of the defined types were placed in this encounter.  Follow-up: No Follow-up on file.  Wilfred Lacy, NP

## 2017-11-04 ENCOUNTER — Encounter: Payer: Self-pay | Admitting: Primary Care

## 2017-11-04 LAB — CBC WITH DIFFERENTIAL/PLATELET
BASOS ABS: 41 {cells}/uL (ref 0–200)
Basophils Relative: 0.9 %
EOS ABS: 207 {cells}/uL (ref 15–500)
Eosinophils Relative: 4.5 %
HEMATOCRIT: 34.5 % — AB (ref 35.0–45.0)
HEMOGLOBIN: 11.9 g/dL (ref 11.7–15.5)
LYMPHS ABS: 745 {cells}/uL — AB (ref 850–3900)
MCH: 31.3 pg (ref 27.0–33.0)
MCHC: 34.5 g/dL (ref 32.0–36.0)
MCV: 90.8 fL (ref 80.0–100.0)
MPV: 10.5 fL (ref 7.5–12.5)
Monocytes Relative: 10.8 %
NEUTROS ABS: 3110 {cells}/uL (ref 1500–7800)
NEUTROS PCT: 67.6 %
Platelets: 222 10*3/uL (ref 140–400)
RBC: 3.8 10*6/uL (ref 3.80–5.10)
RDW: 12.3 % (ref 11.0–15.0)
Total Lymphocyte: 16.2 %
WBC mixed population: 497 cells/uL (ref 200–950)
WBC: 4.6 10*3/uL (ref 3.8–10.8)

## 2017-11-04 LAB — COMPREHENSIVE METABOLIC PANEL
AG RATIO: 1.8 (calc) (ref 1.0–2.5)
ALKALINE PHOSPHATASE (APISO): 71 U/L (ref 33–130)
ALT: 17 U/L (ref 6–29)
AST: 21 U/L (ref 10–35)
Albumin: 3.9 g/dL (ref 3.6–5.1)
BILIRUBIN TOTAL: 0.7 mg/dL (ref 0.2–1.2)
BUN/Creatinine Ratio: 17 (calc) (ref 6–22)
BUN: 16 mg/dL (ref 7–25)
CO2: 27 mmol/L (ref 20–32)
Calcium: 8.5 mg/dL — ABNORMAL LOW (ref 8.6–10.4)
Chloride: 93 mmol/L — ABNORMAL LOW (ref 98–110)
Creat: 0.96 mg/dL — ABNORMAL HIGH (ref 0.60–0.88)
Globulin: 2.2 g/dL (calc) (ref 1.9–3.7)
Glucose, Bld: 74 mg/dL (ref 65–99)
Potassium: 3.9 mmol/L (ref 3.5–5.3)
Sodium: 130 mmol/L — ABNORMAL LOW (ref 135–146)
Total Protein: 6.1 g/dL (ref 6.1–8.1)

## 2017-11-04 LAB — URINALYSIS W MICROSCOPIC + REFLEX CULTURE
BILIRUBIN URINE: NEGATIVE
Bacteria, UA: NONE SEEN /HPF
Glucose, UA: NEGATIVE
HGB URINE DIPSTICK: NEGATIVE
Hyaline Cast: NONE SEEN /LPF
KETONES UR: NEGATIVE
LEUKOCYTE ESTERASE: NEGATIVE
NITRITES URINE, INITIAL: NEGATIVE
Protein, ur: NEGATIVE
RBC / HPF: NONE SEEN /HPF (ref 0–2)
SPECIFIC GRAVITY, URINE: 1.011 (ref 1.001–1.03)
SQUAMOUS EPITHELIAL / LPF: NONE SEEN /HPF (ref <=5)
WBC UA: NONE SEEN /HPF (ref 0–5)
pH: 7 (ref 5.0–8.0)

## 2017-11-04 LAB — NO CULTURE INDICATED

## 2017-11-07 NOTE — Telephone Encounter (Signed)
[name], can you find out what's going on with her CT scan that was ordered by [employer]?

## 2017-11-08 ENCOUNTER — Other Ambulatory Visit: Payer: Self-pay | Admitting: *Deleted

## 2017-11-08 ENCOUNTER — Telehealth: Payer: Self-pay | Admitting: Nurse Practitioner

## 2017-11-08 DIAGNOSIS — B029 Zoster without complications: Secondary | ICD-10-CM

## 2017-11-08 NOTE — Telephone Encounter (Signed)
Faxed refill request. Gabapentin Last office visit:   09/28/2017 Last Filled:    120 capsule 0 10/24/2017  Please advise.

## 2017-11-08 NOTE — Telephone Encounter (Signed)
-----   Message from Anne Ng, NP sent at 11/03/2017 11:46 PM EST ----- Please inform patient's daughter I will not be sending lisinopril. She is currently on losartan. Hold maxzide and continue losartan. F/up with pcp in 1-2weeks

## 2017-11-08 NOTE — Telephone Encounter (Signed)
Left vm for daughter to call back, need to make sure she is aware of massag below. CRM

## 2017-11-09 ENCOUNTER — Telehealth: Payer: Self-pay | Admitting: Primary Care

## 2017-11-09 ENCOUNTER — Encounter: Payer: Self-pay | Admitting: Primary Care

## 2017-11-09 ENCOUNTER — Ambulatory Visit (INDEPENDENT_AMBULATORY_CARE_PROVIDER_SITE_OTHER)
Admission: RE | Admit: 2017-11-09 | Discharge: 2017-11-09 | Disposition: A | Discharge status: Home / Self Care | Payer: Medicare Other | Source: Ambulatory Visit | Attending: Nurse Practitioner | Admitting: Nurse Practitioner

## 2017-11-09 DIAGNOSIS — W19XXXA Unspecified fall, initial encounter: Secondary | ICD-10-CM

## 2017-11-09 DIAGNOSIS — R404 Transient alteration of awareness: Secondary | ICD-10-CM

## 2017-11-09 DIAGNOSIS — G44319 Acute post-traumatic headache, not intractable: Secondary | ICD-10-CM | POA: Diagnosis not present

## 2017-11-09 NOTE — Telephone Encounter (Signed)
The daughter [name] called in returning a call.  I read her the message regarding the BP medications.   [name] didn't understand the message "Because we didn't discuss her BP medications"  She said she just deal with [name] because that's who she normally deals with.    Looking in the chart she is a pt of [name] at [facility]  and [name] at [facility] too.  I called [name] back and inquired to which practice and provider that [name] normally sees.   She said they normally go to [facility] but her mother fell and [facility] wasn't able to get her in so they were seen by [name] at [facility] as an emergency.   [name] stated,   "Thank you for trying to clarify this but never mind."   "If it's important she will call back and leave a message for me"   "We normally see [name] at [facility] so we will just deal with [name] so never mind sending a note to [employer] office".    I will route this note to [facility]'s office since that is who is trying to reach her.

## 2017-11-09 NOTE — Telephone Encounter (Signed)
Spoke with daughter, advise her to stop Maxide, no lisinopril at this time. Continue to take losartan and follow up with PCP 1-2 wks. She verbalized understand.

## 2017-11-10 MED ORDER — GABAPENTIN 300 MG PO CAPS: ORAL_CAPSULE | ORAL | 0 refills | Status: DC

## 2017-11-10 NOTE — Telephone Encounter (Signed)
Noted.  Refill sent to pharmacy. 

## 2017-12-05 ENCOUNTER — Ambulatory Visit: Payer: Medicare Other | Admitting: Primary Care

## 2017-12-05 ENCOUNTER — Encounter: Payer: Self-pay | Admitting: Primary Care

## 2017-12-05 VITALS — BP 140/66 | HR 68 | Temp 98.3°F | Ht 68.0 in | Wt 159.8 lb

## 2017-12-05 DIAGNOSIS — G301 Alzheimer's disease with late onset: Secondary | ICD-10-CM | POA: Diagnosis not present

## 2017-12-05 DIAGNOSIS — M792 Neuralgia and neuritis, unspecified: Secondary | ICD-10-CM | POA: Diagnosis not present

## 2017-12-05 DIAGNOSIS — I10 Essential (primary) hypertension: Secondary | ICD-10-CM

## 2017-12-05 DIAGNOSIS — J449 Chronic obstructive pulmonary disease, unspecified: Secondary | ICD-10-CM

## 2017-12-05 DIAGNOSIS — F0280 Dementia in other diseases classified elsewhere without behavioral disturbance: Secondary | ICD-10-CM

## 2017-12-05 DIAGNOSIS — R269 Unspecified abnormalities of gait and mobility: Secondary | ICD-10-CM | POA: Diagnosis not present

## 2017-12-05 LAB — BASIC METABOLIC PANEL
BUN: 23 mg/dL (ref 6–23)
CHLORIDE: 102 meq/L (ref 96–112)
CO2: 32 mEq/L (ref 19–32)
Calcium: 9 mg/dL (ref 8.4–10.5)
Creatinine, Ser: 1.24 mg/dL — ABNORMAL HIGH (ref 0.40–1.20)
GFR: 44.09 mL/min — ABNORMAL LOW (ref >60.00)
GLUCOSE: 115 mg/dL — AB (ref 70–99)
Potassium: 3.8 mEq/L (ref 3.5–5.1)
Sodium: 143 mEq/L (ref 135–145)

## 2017-12-05 NOTE — Assessment & Plan Note (Signed)
Compliant to Advair, continue same.

## 2017-12-05 NOTE — Progress Notes (Signed)
Subjective:    Patient ID: [name], female    DOB: 06/05/1934, 84 y.o.   MRN: [id number]  HPI  [name] is an 84 year old female who presents today for follow up.  1) COPD/Chronic Cough: Currently managed on Advair daily, albuterol PRN. Also taking Mucinex and Zantac daily. Overall doing well.  2) Dementia: Currently managed on donepezil 10 mg. Also managed on fluoxetine 10 mg for depression. Previously managed on Namenda but this was weaned off in August 2018 per daughter and patient preference. Her daughter has noticed progression of memory loss since her last visit. She has a Comptroller that comes daily from 11 am to 3 pm. She's active and will go out to lunch and shopping. She did fall in January 2019, underwent evaluation with xray and CT scan for which were negative.   3) Herpes Zoster: Diagnosed in December 2018 and treated with Valtrex and Gabapentin. She's compliant to her gabapentin 300 in the morning and afternoon, and 600 mg in the PM. Overall she's doing well on the gabapentin and her pain is manageable.   4) Essential Hypertension: Currently managed on furosemide, losartan 100 mg. Her Maxide was discontinued in early January 2019 due to hyponatremia of 130. Her daughter is checking her BP daily and is getting readings of 150-170/90's. Her daughter increased her Lasix to daily dosing in December 2018 due to weight gain.   BP Readings from Last 3 Encounters:  12/05/17 140/66  11/03/17 (!) 150/70  09/28/17 124/64     5) Gait Disturbance: Located to left lower extremity that has been noticeable to her daughter since December 2018. She did fall one month ago, xray of the left hip unremarkable. The patient denies pain, falls, noticing that her gait is off, weakness. She walks with a cane. History of osteoarthritis to right hip with surgical intervention years ago.   Review of Systems  Respiratory: Negative for cough and shortness of breath.   Cardiovascular: Negative for  chest pain.  Musculoskeletal:       Left leg moving inward during ambulation. Chronic right hip pain.  Neurological: Negative for dizziness and headaches.       Right facial nerve pain since shingles outbreak.  Psychiatric/Behavioral: The patient is not nervous/anxious.        Decreased short term memory       Past Medical History:  Diagnosis Date  . AAA (abdominal aortic aneurysm) (HCC) 01/03/13   3.5x3.3cm  . Alzheimer's dementia    "probably middle stage" (11/05/2014)  . Anemia    "hx of chronic" (11/05/2014)  . Aortic stenosis 03/29/2008   biological prosthetic replacement  . Arthritis    "joints" (11/05/2014)  . Asthma   . Chronic asthmatic bronchitis (HCC)    "q fall and winter" (11/05/2014)  . Coronary artery disease    CABG 03/29/08  . Dyslipidemia   . Exertional shortness of breath   . Family history of adverse reaction to anesthesia    "daughter gets PONV too"  . Heart murmur   . Hypertension   . Hypothyroidism   . LBBB (left bundle branch block)   . Migraines    "none in years' (11/05/2014)  . Mobitz type 2 second degree AV block 10/31/2014  . MVP (mitral valve prolapse)    with mild mitral insufficiency/notes 08/17/2013  . PONV (postoperative nausea and vomiting)   . Presence of permanent cardiac pacemaker   . Sinus pause 10/31/2014  . Stroke [facility]) 03/2008   "  2 light strokes"; denies residual on 11/05/2014  . Urinary frequency      Social History   Socioeconomic History  . Marital status: Divorced    Spouse name: Not on file  . Number of children: 1  . Years of education: Some college  . Highest education level: Not on file  Social Needs  . Financial resource strain: Not on file  . Food insecurity - worry: Not on file  . Food insecurity - inability: Not on file  . Transportation needs - medical: Not on file  . Transportation needs - non-medical: Not on file  Occupational History  . Occupation: Retired  Tobacco Use  . Smoking status: Never Smoker  .  Smokeless tobacco: Never Used  Substance and Sexual Activity  . Alcohol use: No    Alcohol/week: 0.0 oz  . Drug use: No  . Sexual activity: No  Other Topics Concern  . Not on file  Social History Narrative   Lives w/ duaghter and son-in-law   Caffeine WUJ:WJXB     Past Surgical History:  Procedure Laterality Date  . ABDOMINAL HYSTERECTOMY  1977  . AORTIC VALVE REPLACEMENT  03/29/2008   PERICARDIAL TISSUE VALVE  . APPENDECTOMY  ?1977  . CARDIAC CATHETERIZATION  ~ 2009  . CATARACT EXTRACTION W/ INTRAOCULAR LENS IMPLANT Left   . CORONARY ARTERY BYPASS GRAFT  03/29/2008   LIMA TO LAD,SVG TO CX,SVG TO LEFT POSTEROLATERAL BRANCH  . HIP FRACTURE SURGERY Right 06/2010   "3 metal screws"   . INSERT / REPLACE / REMOVE PACEMAKER  11/05/2014  . LOOP RECORDER EXPLANT N/A 11/05/2014   Procedure: LOOP RECORDER EXPLANT;  Surgeon: [name], MD;  Location: [facility] CATH LAB;  Service: Cardiovascular;  Laterality: N/A;  . LOOP RECORDER IMPLANT N/A 09/17/2013   Procedure: LOOP RECORDER IMPLANT;  Surgeon: [name], MD;  Location: [facility] CATH LAB;  Service: Cardiovascular;  Laterality: N/A;  . PERMANENT PACEMAKER INSERTION N/A 11/05/2014   Procedure: PERMANENT PACEMAKER INSERTION;  Surgeon: [name], MD;  Location: [facility] CATH LAB;  Service: Cardiovascular;  Laterality: N/A;  . REFRACTIVE SURGERY Bilateral    [name] 04/28/2008 (08/17/2013)  . RETINAL DETACHMENT SURGERY Left 1994   [name] 04/10/2008 (08/17/2013)  . TIBIAL TUBERCLERPLASTY  11/05/2014  . UTERINE FIBROID SURGERY  1960's    Family History  Problem Relation Age of Onset  . Heart failure Mother   . Diabetes Mother   . Hypertension Mother   . Hyperlipidemia Mother   . Melanoma Grandchild   . Colon cancer Neg Hx     Allergies  Allergen Reactions  . Codeine Nausea And Vomiting and Other (See Comments)    Patient gets violently ill  . Morphine And Related Nausea And Vomiting and Other (See Comments)    Patient get violently ill     Current Outpatient Medications on File Prior to Visit  Medication Sig Dispense Refill  . alendronate (FOSAMAX) 70 MG tablet Take 1 tablet (70 mg total) by mouth once a week. On Sundays. Take with a full glass of water on an empty stomach. For osteoporosis. 12 tablet 3  . aspirin 81 MG chewable tablet Chew 162 mg by mouth every morning.     Marland Kitchen atorvastatin (LIPITOR) 40 MG tablet Take 1 tablet by mouth every evening for cholesterol. 90 tablet 3  . donepezil (ARICEPT) 10 MG tablet Take 1 tablet by mouth every night at bedtime for memory. 90 tablet 3  . FLUoxetine (PROZAC) 10 MG capsule Take 1 capsule (10  mg total) by mouth daily. For anxiety and depression 90 capsule 3  . Fluticasone-Salmeterol (ADVAIR) 250-50 MCG/DOSE AEPB Inhale 1 puff into the lungs 2 (two) times daily. For COPD 60 each 11  . furosemide (LASIX) 40 MG tablet Take 1 tablet (40 mg total) by mouth daily. 90 tablet 2  . gabapentin (NEURONTIN) 300 MG capsule Take 1 capsule by mouth every morning and afternoon, take 2 capsules by mouth at bedtime. 120 capsule 0  . levothyroxine (SYNTHROID, LEVOTHROID) 50 MCG tablet Take 1 tablet by mouth on an empty stomach with a full glass of water. 90 tablet 3  . losartan (COZAAR) 100 MG tablet TAKE 1 TABLET EVERY MORNING 90 tablet 1  . LUMIGAN 0.01 % SOLN Place 1 drop into the left eye at bedtime.  4  . oxybutynin (DITROPAN-XL) 5 MG 24 hr tablet Take 1 tablet (5 mg total) by mouth at bedtime. For urinary incontinence. 90 tablet 3  . ranitidine (ZANTAC 150 MAXIMUM STRENGTH) 150 MG tablet Take 150 mg by mouth 2 (two) times daily.     Marland Kitchen albuterol (PROVENTIL HFA;VENTOLIN HFA) 108 (90 Base) MCG/ACT inhaler Inhale 1 puff into the lungs every 6 (six) hours as needed for wheezing or shortness of breath.    Marland Kitchen amoxicillin (AMOXIL) 500 MG tablet Take 4 tablets by mouth as directed.  1   No current facility-administered medications on file prior to visit.     BP 140/66   Pulse 68   Temp 98.3 F (36.8  C) (Oral)   Ht 5\' 8"  (1.727 m)   Wt 159 lb 12.8 oz (72.5 kg)   SpO2 94%   BMI 24.30 kg/m    Objective:   Physical Exam  Constitutional: She appears well-nourished.  Neck: Neck supple.  Cardiovascular: Normal rate and regular rhythm.  Pulmonary/Chest: Effort normal and breath sounds normal.  Musculoskeletal:  Left lower part of lower extremity bowing inward with ambulation. No left hip shortening.   Neurological: She is alert.  Follows commands, answers some questions appropriately.   Skin: Skin is warm and dry.          Assessment & Plan:

## 2017-12-05 NOTE — Patient Instructions (Addendum)
Stop by the lab prior to leaving today. I will notify you of your results once received.   Schedule a Medicare Wellness Visit with our nurse and a physical with me in August 2019.  Please notify me if she's interested in physical therapy for her left leg.   I'll be in touch soon.  It was a pleasure to see you today!

## 2017-12-05 NOTE — Assessment & Plan Note (Signed)
Secondary to shingles in December 2018. Continue with gabapentin at current dose, consider weaning down soon. Daughter will try and update.

## 2017-12-05 NOTE — Assessment & Plan Note (Signed)
Left lower part of lower extremity inverting inward. Exam overall unremarkable and she shows no signs of pain or distress. Discussed physical therapy, daughter will think about this. If pain develops then recommend CT of left hip, otherwise will continue to monitor.

## 2017-12-05 NOTE — Assessment & Plan Note (Signed)
Short term memory loss progressing per daughter. Continue Aricept. Continue to work on fall prevention.

## 2017-12-05 NOTE — Assessment & Plan Note (Signed)
Removed from Maxzide in January 2019 due to hyponatremia, repeat BMP today.  Home BP readings above goal, compared cuff today which is reading about the same. Continue losartan and furosemide for now. Consider furosemide to be cause of hyponatremia.  Consider adding in Amlodipine if needed, will consult with cardiologist. Await BMP first.

## 2017-12-06 ENCOUNTER — Encounter: Payer: Self-pay | Admitting: Primary Care

## 2017-12-07 ENCOUNTER — Other Ambulatory Visit: Payer: Self-pay | Admitting: Primary Care

## 2017-12-07 DIAGNOSIS — I10 Essential (primary) hypertension: Secondary | ICD-10-CM

## 2017-12-07 MED ORDER — AMLODIPINE BESYLATE 10 MG PO TABS: 10.0000 mg | ORAL_TABLET | Freq: Every day | ORAL | 0 refills | Status: DC

## 2017-12-09 ENCOUNTER — Other Ambulatory Visit: Payer: Self-pay | Admitting: Primary Care

## 2017-12-09 DIAGNOSIS — B029 Zoster without complications: Secondary | ICD-10-CM

## 2017-12-09 NOTE — Telephone Encounter (Signed)
Refill sent to pharmacy.   

## 2017-12-09 NOTE — Telephone Encounter (Signed)
Ok to refill? Electronically refill request for gabapentin (NEURONTIN) 300 MG capsule  Last prescribed on 11/10/2017. Last seen on 12/05/2017

## 2017-12-21 ENCOUNTER — Ambulatory Visit: Payer: Medicare Other | Admitting: Primary Care

## 2018-01-03 ENCOUNTER — Ambulatory Visit (INDEPENDENT_AMBULATORY_CARE_PROVIDER_SITE_OTHER): Payer: Medicare Other | Admitting: Primary Care

## 2018-01-03 DIAGNOSIS — R05 Cough: Secondary | ICD-10-CM

## 2018-01-03 DIAGNOSIS — R053 Chronic cough: Secondary | ICD-10-CM

## 2018-01-03 DIAGNOSIS — I10 Essential (primary) hypertension: Secondary | ICD-10-CM | POA: Diagnosis not present

## 2018-01-03 MED ORDER — AMLODIPINE BESYLATE 10 MG PO TABS: 10.0000 mg | ORAL_TABLET | Freq: Every day | ORAL | 3 refills | Status: DC

## 2018-01-03 NOTE — Progress Notes (Signed)
Subjective:    Patient ID: [name], female    DOB: 04-09-1934, 84 y.o.   MRN: [id number]  HPI  [name] is an 84 year old female who presents today for hypertension follow up.  Removed from Maxzide in January 2019 due to hyponatremia. Follow up in mid February 2019 with elevated BP readings since removal of Maxzide but improvement in hyponatremia. Amlodipine 10 mg was added to her regimen given elevated readings.  Since her last visit her blood pressure has decreased. She is checking her BP at home every morning and is getting readings of 150-160's/80's. She denies ankle edema, chest pain, dizziness. Her cough has resolved since the addition of Zantac.   BP Readings from Last 3 Encounters:  01/03/18 130/68  12/05/17 140/66  11/03/17 (!) 150/70      Review of Systems  Respiratory: Negative for cough and shortness of breath.   Cardiovascular: Negative for chest pain and leg swelling.  Neurological: Negative for dizziness and headaches.       Past Medical History:  Diagnosis Date  . AAA (abdominal aortic aneurysm) (HCC) 01/03/13   3.5x3.3cm  . Alzheimer's dementia    "probably middle stage" (11/05/2014)  . Anemia    "hx of chronic" (11/05/2014)  . Aortic stenosis 03/29/2008   biological prosthetic replacement  . Arthritis    "joints" (11/05/2014)  . Asthma   . Chronic asthmatic bronchitis (HCC)    "q fall and winter" (11/05/2014)  . Coronary artery disease    CABG 03/29/08  . Dyslipidemia   . Exertional shortness of breath   . Family history of adverse reaction to anesthesia    "daughter gets PONV too"  . Heart murmur   . Hypertension   . Hypothyroidism   . LBBB (left bundle branch block)   . Migraines    "none in years' (11/05/2014)  . Mobitz type 2 second degree AV block 10/31/2014  . MVP (mitral valve prolapse)    with mild mitral insufficiency/notes 08/17/2013  . PONV (postoperative nausea and vomiting)   . Presence of permanent cardiac pacemaker   . Sinus  pause 10/31/2014  . Stroke [facility]) 03/2008   "2 light strokes"; denies residual on 11/05/2014  . Urinary frequency      Social History   Socioeconomic History  . Marital status: Divorced    Spouse name: Not on file  . Number of children: 1  . Years of education: Some college  . Highest education level: Not on file  Social Needs  . Financial resource strain: Not on file  . Food insecurity - worry: Not on file  . Food insecurity - inability: Not on file  . Transportation needs - medical: Not on file  . Transportation needs - non-medical: Not on file  Occupational History  . Occupation: Retired  Tobacco Use  . Smoking status: Never Smoker  . Smokeless tobacco: Never Used  Substance and Sexual Activity  . Alcohol use: No    Alcohol/week: 0.0 oz  . Drug use: No  . Sexual activity: No  Other Topics Concern  . Not on file  Social History Narrative   Lives w/ duaghter and son-in-law   Caffeine HBZ:JIRC     Past Surgical History:  Procedure Laterality Date  . ABDOMINAL HYSTERECTOMY  1977  . AORTIC VALVE REPLACEMENT  03/29/2008   PERICARDIAL TISSUE VALVE  . APPENDECTOMY  ?1977  . CARDIAC CATHETERIZATION  ~ 2009  . CATARACT EXTRACTION W/ INTRAOCULAR LENS IMPLANT Left   .  CORONARY ARTERY BYPASS GRAFT  03/29/2008   LIMA TO LAD,SVG TO CX,SVG TO LEFT POSTEROLATERAL BRANCH  . HIP FRACTURE SURGERY Right 06/2010   "3 metal screws"   . INSERT / REPLACE / REMOVE PACEMAKER  11/05/2014  . LOOP RECORDER EXPLANT N/A 11/05/2014   Procedure: LOOP RECORDER EXPLANT;  Surgeon: [name], MD;  Location: [facility];  Service: Cardiovascular;  Laterality: N/A;  . LOOP RECORDER IMPLANT N/A 09/17/2013   Procedure: LOOP RECORDER IMPLANT;  Surgeon: [name], MD;  Location: [facility];  Service: Cardiovascular;  Laterality: N/A;  . PERMANENT PACEMAKER INSERTION N/A 11/05/2014   Procedure: PERMANENT PACEMAKER INSERTION;  Surgeon: [name], MD;  Location: [facility];  Service: Cardiovascular;   Laterality: N/A;  . REFRACTIVE SURGERY Bilateral    [name] 04/28/2008 (08/17/2013)  . RETINAL DETACHMENT SURGERY Left 1994   [name] 04/10/2008 (08/17/2013)  . TIBIAL TUBERCLERPLASTY  11/05/2014  . UTERINE FIBROID SURGERY  1960's    Family History  Problem Relation Age of Onset  . Heart failure Mother   . Diabetes Mother   . Hypertension Mother   . Hyperlipidemia Mother   . Melanoma Grandchild   . Colon cancer Neg Hx     Allergies  Allergen Reactions  . Codeine Nausea And Vomiting and Other (See Comments)    Patient gets violently ill  . Morphine And Related Nausea And Vomiting and Other (See Comments)    Patient get violently ill    Current Outpatient Medications on File Prior to Visit  Medication Sig Dispense Refill  . albuterol (PROVENTIL HFA;VENTOLIN HFA) 108 (90 Base) MCG/ACT inhaler Inhale 1 puff into the lungs every 6 (six) hours as needed for wheezing or shortness of breath.    Marland Kitchen alendronate (FOSAMAX) 70 MG tablet Take 1 tablet (70 mg total) by mouth once a week. On Sundays. Take with a full glass of water on an empty stomach. For osteoporosis. 12 tablet 3  . aspirin 81 MG chewable tablet Chew 162 mg by mouth every morning.     Marland Kitchen atorvastatin (LIPITOR) 40 MG tablet Take 1 tablet by mouth every evening for cholesterol. 90 tablet 3  . donepezil (ARICEPT) 10 MG tablet Take 1 tablet by mouth every night at bedtime for memory. 90 tablet 3  . FLUoxetine (PROZAC) 10 MG capsule Take 1 capsule (10 mg total) by mouth daily. For anxiety and depression 90 capsule 3  . Fluticasone-Salmeterol (ADVAIR) 250-50 MCG/DOSE AEPB Inhale 1 puff into the lungs 2 (two) times daily. For COPD 60 each 11  . furosemide (LASIX) 40 MG tablet Take 1 tablet (40 mg total) by mouth daily. 90 tablet 2  . gabapentin (NEURONTIN) 300 MG capsule TAKE 1 CAPSULE BY MOUTH EVERY MORNING AND AFTERNOON, TAKE 2 CAPSULES BY MOUTH AT BEDTIME. 120 capsule 0  . levothyroxine (SYNTHROID, LEVOTHROID) 50 MCG tablet Take 1 tablet  by mouth on an empty stomach with a full glass of water. 90 tablet 3  . losartan (COZAAR) 100 MG tablet TAKE 1 TABLET EVERY MORNING 90 tablet 1  . LUMIGAN 0.01 % SOLN Place 1 drop into the left eye at bedtime.  4  . oxybutynin (DITROPAN-XL) 5 MG 24 hr tablet Take 1 tablet (5 mg total) by mouth at bedtime. For urinary incontinence. 90 tablet 3  . ranitidine (ZANTAC 150 MAXIMUM STRENGTH) 150 MG tablet Take 150 mg by mouth 2 (two) times daily.     Marland Kitchen amoxicillin (AMOXIL) 500 MG tablet Take 4 tablets by mouth as  directed.  1   No current facility-administered medications on file prior to visit.     BP 130/68   Pulse 62   Temp 98.2 F (36.8 C) (Oral)   Ht 5\' 8"  (1.727 m)   Wt 162 lb 12 oz (73.8 kg)   SpO2 95%   BMI 24.75 kg/m    Objective:   Physical Exam  Constitutional: She appears well-nourished.  Neck: Neck supple.  Cardiovascular: Normal rate and regular rhythm.  No lower extremity/ankle edema  Pulmonary/Chest: Effort normal and breath sounds normal.  Skin: Skin is warm and dry.  Psychiatric: She has a normal mood and affect.          Assessment & Plan:

## 2018-01-03 NOTE — Assessment & Plan Note (Signed)
Improved with manual reading on Amlodipine. Question home readings as they are quite higher than office readings. Continue Amlodipine and Losartan. Refills sent to pharmacy.

## 2018-01-03 NOTE — Patient Instructions (Addendum)
Continue Amlodipine 10 mg and Losartan 100 mg for high blood pressure.   We will see you in August as scheduled.  It was a pleasure to see you today!

## 2018-01-03 NOTE — Assessment & Plan Note (Signed)
Resolved with Zantac. Continue same.

## 2018-01-06 ENCOUNTER — Other Ambulatory Visit: Payer: Self-pay | Admitting: Primary Care

## 2018-01-06 DIAGNOSIS — B029 Zoster without complications: Secondary | ICD-10-CM

## 2018-01-18 ENCOUNTER — Ambulatory Visit (INDEPENDENT_AMBULATORY_CARE_PROVIDER_SITE_OTHER): Payer: Medicare Other | Admitting: *Deleted

## 2018-01-18 DIAGNOSIS — I441 Atrioventricular block, second degree: Secondary | ICD-10-CM

## 2018-01-18 NOTE — Progress Notes (Signed)
Remote pacemaker transmission.   

## 2018-01-20 ENCOUNTER — Encounter: Payer: Self-pay | Admitting: Cardiology

## 2018-01-20 LAB — CUP PACEART REMOTE DEVICE CHECK
Battery Impedance: 133 Ohm
Brady Statistic AP VP Percent: 0 %
Brady Statistic AP VS Percent: 28 %
Brady Statistic AS VP Percent: 0 %
Brady Statistic AS VS Percent: 72 %
Implantable Lead Implant Date: 20160112
Implantable Lead Location: 753859
Implantable Lead Model: 5076
Implantable Pulse Generator Implant Date: 20160112
Lead Channel Impedance Value: 453 Ohm
Lead Channel Impedance Value: 506 Ohm
Lead Channel Pacing Threshold Amplitude: 0.625 V
Lead Channel Pacing Threshold Amplitude: 0.75 V
Lead Channel Pacing Threshold Pulse Width: 0.4 ms
Lead Channel Setting Pacing Amplitude: 2 V
Lead Channel Setting Sensing Sensitivity: 2 mV
MDC IDC LEAD IMPLANT DT: 20160112
MDC IDC LEAD LOCATION: 753860
MDC IDC MSMT BATTERY REMAINING LONGEVITY: 164 mo
MDC IDC MSMT BATTERY VOLTAGE: 2.79 V
MDC IDC MSMT LEADCHNL RV PACING THRESHOLD PULSEWIDTH: 0.4 ms
MDC IDC SESS DTM: 20190327112125
MDC IDC SET LEADCHNL RA PACING AMPLITUDE: 1.5 V
MDC IDC SET LEADCHNL RV PACING PULSEWIDTH: 0.4 ms

## 2018-02-01 ENCOUNTER — Ambulatory Visit: Payer: Medicare Other | Admitting: Cardiovascular Disease

## 2018-02-01 ENCOUNTER — Encounter: Payer: Self-pay | Admitting: Cardiovascular Disease

## 2018-02-01 VITALS — BP 128/72 | HR 64 | Ht 68.0 in | Wt 160.0 lb

## 2018-02-01 DIAGNOSIS — E7800 Pure hypercholesterolemia, unspecified: Secondary | ICD-10-CM

## 2018-02-01 DIAGNOSIS — Z953 Presence of xenogenic heart valve: Secondary | ICD-10-CM

## 2018-02-01 DIAGNOSIS — I739 Peripheral vascular disease, unspecified: Secondary | ICD-10-CM

## 2018-02-01 DIAGNOSIS — Z950 Presence of cardiac pacemaker: Secondary | ICD-10-CM

## 2018-02-01 DIAGNOSIS — I25119 Atherosclerotic heart disease of native coronary artery with unspecified angina pectoris: Secondary | ICD-10-CM | POA: Diagnosis not present

## 2018-02-01 DIAGNOSIS — I471 Supraventricular tachycardia: Secondary | ICD-10-CM | POA: Diagnosis not present

## 2018-02-01 DIAGNOSIS — I441 Atrioventricular block, second degree: Secondary | ICD-10-CM | POA: Diagnosis not present

## 2018-02-01 DIAGNOSIS — I341 Nonrheumatic mitral (valve) prolapse: Secondary | ICD-10-CM | POA: Diagnosis not present

## 2018-02-01 DIAGNOSIS — I714 Abdominal aortic aneurysm, without rupture, unspecified: Secondary | ICD-10-CM

## 2018-02-01 DIAGNOSIS — I5032 Chronic diastolic (congestive) heart failure: Secondary | ICD-10-CM

## 2018-02-01 NOTE — Progress Notes (Signed)
Patient ID: [name], female   DOB: 1934/07/26, 84 y.o.   MRN: [id number]     Cardiology Office Note    Date:  02/05/2018   ID:  [name], DOB 07/12/1934, MRN [id number]  PCP:  Doreene Nest, NP  Cardiologist:   [name], MD   chief complaint: abnormal CXR.   History of Present Illness:  [name] is a 84 y.o. female with a history of coronary artery disease, status post aortic valve biological prosthesis replacement, mild mitral valve prolapse with mild mitral insufficiency, chronic diastolic heart failure, history of syncope due to second degree AV block Mobitz type II and a dual-chamber permanent pacemaker ([employer]tronic, 2016), small AAA.  She has been doing quite well.  Her only complaint is that she "takes too many pills.  For this reason her Namenda was discontinued.  Her memory problems persist but have not worsened, according to her daughter.  The patient specifically denies any chest pain at rest exertion, dyspnea at rest or with exertion, orthopnea, paroxysmal nocturnal dyspnea, syncope, palpitations, focal neurological deficits, intermittent claudication, lower extremity edema, unexplained weight gain, cough, hemoptysis or wheezing.  She has not required her albuterol inhaler for over a year.  She did have shingles these have healed well without residual neuropathic pain.  Roughly a year ago, around the time of a viral upper respiratory infection her echo did show some evidence of diastolic dysfunction with elevated filling pressures and she was started on loop diuretics.  Since that time she has not required escalation of diuretic therapy or hospitalization for heart failure.  Interrogation of her pacemaker shows normal device function. There is 28% atrial pacing and only 0.2% ventricular pacing. Generator longevity is estimated to be 13.5 years. She has had occasional episodes of paroxysmal atrial tachycardia, longest under one minute and all  asymptomatic.Marland Kitchen Heart rate histogram appears appropriate.  Lead parameters are normal and unchanged.   Past Medical History:  Diagnosis Date  . AAA (abdominal aortic aneurysm) (HCC) 01/03/13   3.5x3.3cm  . Alzheimer's dementia    "probably middle stage" (11/05/2014)  . Anemia    "hx of chronic" (11/05/2014)  . Aortic stenosis 03/29/2008   biological prosthetic replacement  . Arthritis    "joints" (11/05/2014)  . Asthma   . Chronic asthmatic bronchitis (HCC)    "q fall and winter" (11/05/2014)  . Coronary artery disease    CABG 03/29/08  . Dyslipidemia   . Exertional shortness of breath   . Family history of adverse reaction to anesthesia    "daughter gets PONV too"  . Heart murmur   . Hypertension   . Hypothyroidism   . LBBB (left bundle branch block)   . Migraines    "none in years' (11/05/2014)  . Mobitz type 2 second degree AV block 10/31/2014  . MVP (mitral valve prolapse)    with mild mitral insufficiency/notes 08/17/2013  . PONV (postoperative nausea and vomiting)   . Presence of permanent cardiac pacemaker   . Sinus pause 10/31/2014  . Stroke [facility]) 03/2008   "2 light strokes"; denies residual on 11/05/2014  . Urinary frequency     Past Surgical History:  Procedure Laterality Date  . ABDOMINAL HYSTERECTOMY  1977  . AORTIC VALVE REPLACEMENT  03/29/2008   PERICARDIAL TISSUE VALVE  . APPENDECTOMY  ?1977  . CARDIAC CATHETERIZATION  ~ 2009  . CATARACT EXTRACTION W/ INTRAOCULAR LENS IMPLANT Left   . CORONARY ARTERY BYPASS GRAFT  03/29/2008   LIMA  TO LAD,SVG TO CX,SVG TO LEFT POSTEROLATERAL BRANCH  . HIP FRACTURE SURGERY Right 06/2010   "3 metal screws"   . INSERT / REPLACE / REMOVE PACEMAKER  11/05/2014  . LOOP RECORDER EXPLANT N/A 11/05/2014   Procedure: LOOP RECORDER EXPLANT;  Surgeon: [name], MD;  Location: [facility];  Service: Cardiovascular;  Laterality: N/A;  . LOOP RECORDER IMPLANT N/A 09/17/2013   Procedure: LOOP RECORDER IMPLANT;  Surgeon: [name], MD;   Location: [facility];  Service: Cardiovascular;  Laterality: N/A;  . PERMANENT PACEMAKER INSERTION N/A 11/05/2014   Procedure: PERMANENT PACEMAKER INSERTION;  Surgeon: [name], MD;  Location: [facility];  Service: Cardiovascular;  Laterality: N/A;  . REFRACTIVE SURGERY Bilateral    [name] 04/28/2008 (08/17/2013)  . RETINAL DETACHMENT SURGERY Left 1994   [name] 04/10/2008 (08/17/2013)  . TIBIAL TUBERCLERPLASTY  11/05/2014  . UTERINE FIBROID SURGERY  1960's    Current Medications: Outpatient Medications Prior to Visit  Medication Sig Dispense Refill  . alendronate (FOSAMAX) 70 MG tablet Take 1 tablet (70 mg total) by mouth once a week. On Sundays. Take with a full glass of water on an empty stomach. For osteoporosis. 12 tablet 3  . amLODipine (NORVASC) 10 MG tablet Take 1 tablet (10 mg total) by mouth daily. 90 tablet 3  . amoxicillin (AMOXIL) 500 MG tablet Take 4 tablets by mouth as directed.  1  . aspirin 81 MG chewable tablet Chew 162 mg by mouth every morning.     Marland Kitchen atorvastatin (LIPITOR) 40 MG tablet Take 1 tablet by mouth every evening for cholesterol. 90 tablet 3  . donepezil (ARICEPT) 10 MG tablet Take 1 tablet by mouth every night at bedtime for memory. 90 tablet 3  . FLUoxetine (PROZAC) 10 MG capsule Take 1 capsule (10 mg total) by mouth daily. For anxiety and depression 90 capsule 3  . Fluticasone-Salmeterol (ADVAIR) 250-50 MCG/DOSE AEPB Inhale 1 puff into the lungs 2 (two) times daily. For COPD 60 each 11  . furosemide (LASIX) 40 MG tablet Take 1 tablet (40 mg total) by mouth daily. 90 tablet 2  . gabapentin (NEURONTIN) 300 MG capsule TAKE 1 CAPSULE BY MOUTH EVERY MORNING AND AFTERNOON, TAKE 2 CAPSULES BY MOUTH AT BEDTIME. 120 capsule 0  . levothyroxine (SYNTHROID, LEVOTHROID) 50 MCG tablet Take 1 tablet by mouth on an empty stomach with a full glass of water. 90 tablet 3  . losartan (COZAAR) 100 MG tablet TAKE 1 TABLET EVERY MORNING 90 tablet 1  . LUMIGAN 0.01 % SOLN Place 1  drop into the left eye at bedtime.  4  . oxybutynin (DITROPAN-XL) 5 MG 24 hr tablet Take 1 tablet (5 mg total) by mouth at bedtime. For urinary incontinence. 90 tablet 3  . ranitidine (ZANTAC 150 MAXIMUM STRENGTH) 150 MG tablet Take 150 mg by mouth 2 (two) times daily.     Marland Kitchen albuterol (PROVENTIL HFA;VENTOLIN HFA) 108 (90 Base) MCG/ACT inhaler Inhale 1 puff into the lungs every 6 (six) hours as needed for wheezing or shortness of breath.     No facility-administered medications prior to visit.      Allergies:   Codeine and Morphine and related   Social History   Socioeconomic History  . Marital status: Divorced    Spouse name: Not on file  . Number of children: 1  . Years of education: Some college  . Highest education level: Not on file  Occupational History  . Occupation: Retired  Engineer, production  .  Financial resource strain: Not on file  . Food insecurity:    Worry: Not on file    Inability: Not on file  . Transportation needs:    Medical: Not on file    Non-medical: Not on file  Tobacco Use  . Smoking status: Never Smoker  . Smokeless tobacco: Never Used  Substance and Sexual Activity  . Alcohol use: No    Alcohol/week: 0.0 oz  . Drug use: No  . Sexual activity: Never  Lifestyle  . Physical activity:    Days per week: Not on file    Minutes per session: Not on file  . Stress: Not on file  Relationships  . Social connections:    Talks on phone: Not on file    Gets together: Not on file    Attends religious service: Not on file    Active member of club or organization: Not on file    Attends meetings of clubs or organizations: Not on file    Relationship status: Not on file  Other Topics Concern  . Not on file  Social History Narrative   Lives w/ duaghter and son-in-law   Caffeine ZOX:WRUE      Family History:  The patient's family history includes Diabetes in her mother; Heart failure in her mother; Hyperlipidemia in her mother; Hypertension in her mother;  Melanoma in her grandchild.   ROS:   Please see the history of present illness.    ROS All other systems reviewed and are negative.   PHYSICAL EXAM:   VS:  BP 128/72   Pulse 64   Ht 5\' 8"  (1.727 m)   Wt 160 lb (72.6 kg)   BMI 24.33 kg/m     General: Alert, oriented x3, no distress, healthy left subclavian pacemaker site Head: no evidence of trauma, PERRL, EOMI, no exophtalmos or lid lag, no myxedema, no xanthelasma; normal ears, nose and oropharynx Neck: normal jugular venous pulsations and no hepatojugular reflux; brisk carotid pulses without delay and no carotid bruits Chest: clear to auscultation, no signs of consolidation by percussion or palpation, normal fremitus, symmetrical and full respiratory excursions Cardiovascular: normal position and quality of the apical impulse, regular rhythm, normal first and second heart sounds, no murmurs, rubs or gallops Abdomen: no tenderness or distention, no masses by palpation, no abnormal pulsatility or arterial bruits, normal bowel sounds, no hepatosplenomegaly Extremities: no clubbing, cyanosis or edema; 2+ radial, ulnar and brachial pulses bilaterally; 2+ right femoral, posterior tibial and dorsalis pedis pulses; 2+ left femoral, posterior tibial and dorsalis pedis pulses; no subclavian or femoral bruits Neurological: grossly nonfocal Psych: Normal mood and affect   Wt Readings from Last 3 Encounters:  02/01/18 160 lb (72.6 kg)  01/03/18 162 lb 12 oz (73.8 kg)  12/05/17 159 lb 12.8 oz (72.5 kg)      Studies/Labs Reviewed:   EKG:  EKG is ordered today.  The ekg ordered today demonstrates atrial paced, ventricular sensed rhythm, right bundle branch block, left anterior fascicular block.  A couple of PACs are also seen.  Carotid US: 12/24/2016, mild plaque  ECHO: January 26, 2017 - Left ventricle: The cavity size was normal. Wall thickness was   normal. Systolic function was normal. The estimated ejection   fraction was in the range  of 60% to 65%. Wall motion was normal;   there were no regional wall motion abnormalities. Doppler   parameters are consistent with pseudonormall left ventricular   relaxation (grade 2 diastolic dysfunction). The E/e&' ratio  is   >20, suggesting markedly elevated LV filling pressure. - Aortic valve: Bioprosthetic AVR. No obstruction. Mean gradient   (S): 14 mm Hg. Peak gradient (S): 26 mm Hg. - Mitral valve: Mildly thickened leaflet, mild late systolic   prolapse of the AMVL. There was mild regurgitation. - Left atrium: Moderately dilated. - Right ventricle: The cavity size was normal. Wall thickness was   normal. Pacer wire or catheter noted in right ventricle. - Right atrium: The atrium was normal in size. Pacer wire or   catheter noted in right atrium. - Atrial septum: Bows from left to right, suggesting a high LA   pressure. - Tricuspid valve: There was moderate regurgitation. - Pulmonary arteries: PA peak pressure: 31 mm Hg (S). - Inferior vena cava: The vessel was normal in size. The   respirophasic diameter changes were in the normal range (>= 50%),   consistent with normal central venous pressure.  Impressions:  - Compared to a prior study in 2015, there is a bioprosthetic AVR   without obstruction or regurgitation. LVEF stable at 60-65%,   grade 2 DD with high LV filling pressure, moderate LAE, moderate   TR and RVSP of 31 mmHg.   AAA [place]:  Stable infrarenal fusiform AAA in the mid aorta, measuring 3.5 cm x 3.7 cm. Normal caliber common and external iliac arteries, bilaterally. Aorto-iliac atherosclerosis. Progression of mid aorta disease, with higher velocities compared to prior exam, now in >50% range of stenosis. Stable >50% bilateral common iliac artery stenosis. Progression of bilateral external iliac artery disease, now in >50% range of stenosis. Patent IVC.  Labs: 05/25/2016 Total cholesterol 137, triglycerides 107, HDL 60, LDL 56, LDL particle number [id number],  LDL size 20.5 (borderline) Hemoglobin 13.9, creatinine 1.01  ASSESSMENT:    1. Chronic diastolic heart failure (HCC)   2. Mobitz type 2 second degree AV block   3. Pacemaker   4. PAT (paroxysmal atrial tachycardia) (HCC)   5. Coronary artery disease involving native coronary artery of native heart with angina pectoris (HCC)   6. Hypercholesterolemia   7. S/P aortic valve replacement with bioprosthetic valve   8. MVP (mitral valve prolapse)   9. AAA (abdominal aortic aneurysm) without rupture (HCC)   10. PAD (peripheral artery disease) (HCC)      PLAN:  In order of problems listed above:  1. CHF: Euvolemic, very low dose of loop diuretics, good functional status.   2. Second-degree AV block, Mobitz type II: No syncope since device implantation. She had up to 9.5 second pauses due to second-degree AV block and still has evidence of bifascicular block.  She has a remarkably low need for ventricular pacing nonetheless. 3. Pacemaker: Normal pacemaker function, no changes made today. Follow-up via remote [facility] downloads every 3 months 4. Atrial tachycardia: Remains infrequent and completely asymptomatic.  I would avoid beta-blockers due to her history of reactive airway disease 5. CAD s/p CABG: She does not have angina.  She is on statin. LDL at target. 6. HLP: Very favorable lipid profile 7. S/P AVR (2009, 21 mm [facility] Ease bovine bioprosthesis): Normal prosthetic valve function by echo a year ago 8. MVP/MR: Not hemodynamically significant 9. AAA: Unchanged diameter 3.5x3.7 cm, most recent ultrasound evaluation was in September 2018.   10. PAD: She does have some stenosis in the mid aorta as well as greater than 50% bilateral common iliac artery stenosis and external iliac stenosis.  She is asymptomatic.  Denies intermittent claudication. 11. Alzheimer's disease: Memory problems  appear stable    Medication Adjustments/Labs and Tests Ordered: Current medicines are reviewed at  length with the patient today.  Concerns regarding medicines are outlined above.  Medication changes, Labs and Tests ordered today are listed in the Patient Instructions below. Patient Instructions  Dr Royann Shivers recommends that you continue on your current medications as directed. Please refer to the Current Medication list given to you today.  Remote monitoring is used to monitor your Pacemaker or ICD from home. This monitoring reduces the number of office visits required to check your device to one time per year. It allows [place] to keep an eye on the functioning of your device to ensure it is working properly. You are scheduled for a device check from home on Wednesday, June 26th, 2019. You may send your transmission at any time that day. If you have a wireless device, the transmission will be sent automatically. After your physician reviews your transmission, you will receive a notification with your next transmission date.  Dr Royann Shivers recommends that you schedule a follow-up appointment in 12 months with a pacemaker check. You will receive a reminder letter in the mail two months in advance. If you don't receive a letter, please call our office to schedule the follow-up appointment.  If you need a refill on your cardiac medications before your next appointment, please call your pharmacy.    Signed, [name], MD  02/05/2018 5:16 PM    [facility] [address] [place], [place], [place]  [address] Phone: [phone]; Fax: [phone]

## 2018-02-01 NOTE — Patient Instructions (Signed)
Dr Royann Shivers recommends that you continue on your current medications as directed. Please refer to the Current Medication list given to you today.  Remote monitoring is used to monitor your Pacemaker or ICD from home. This monitoring reduces the number of office visits required to check your device to one time per year. It allows [place] to keep an eye on the functioning of your device to ensure it is working properly. You are scheduled for a device check from home on Wednesday, June 26th, 2019. You may send your transmission at any time that day. If you have a wireless device, the transmission will be sent automatically. After your physician reviews your transmission, you will receive a notification with your next transmission date.  Dr Royann Shivers recommends that you schedule a follow-up appointment in 12 months with a pacemaker check. You will receive a reminder letter in the mail two months in advance. If you don't receive a letter, please call our office to schedule the follow-up appointment.  If you need a refill on your cardiac medications before your next appointment, please call your pharmacy.

## 2018-02-05 ENCOUNTER — Encounter: Payer: Self-pay | Admitting: Cardiovascular Disease

## 2018-02-06 ENCOUNTER — Other Ambulatory Visit: Payer: Self-pay | Admitting: Primary Care

## 2018-02-13 ENCOUNTER — Encounter: Payer: Self-pay | Admitting: Primary Care

## 2018-02-14 ENCOUNTER — Encounter: Payer: Self-pay | Admitting: Primary Care

## 2018-02-16 ENCOUNTER — Encounter: Payer: Self-pay | Admitting: Primary Care

## 2018-04-09 ENCOUNTER — Other Ambulatory Visit: Payer: Self-pay | Admitting: Primary Care

## 2018-04-09 DIAGNOSIS — M8580 Other specified disorders of bone density and structure, unspecified site: Secondary | ICD-10-CM

## 2018-04-10 ENCOUNTER — Other Ambulatory Visit: Payer: Self-pay | Admitting: Primary Care

## 2018-04-10 DIAGNOSIS — M8580 Other specified disorders of bone density and structure, unspecified site: Secondary | ICD-10-CM

## 2018-04-10 NOTE — Telephone Encounter (Signed)
Ok to refill? Electronically refill request for alendronate (FOSAMAX) 70 MG tablet  Last prescribed on 06/03/2017  Last seen on 01/03/2018

## 2018-04-10 NOTE — Telephone Encounter (Signed)
Noted, refill sent to pharmacy. Bone density scan due in 2020.

## 2018-04-11 NOTE — Telephone Encounter (Signed)
Duplicate. Refilled on 04/10/18.

## 2018-04-11 NOTE — Telephone Encounter (Signed)
Ok to refill? Electronically refill request for alendronate (FOSAMAX) 70 MG tablet  Last prescribed on 01/31/2017  Last seen on 01/03/2018

## 2018-04-19 ENCOUNTER — Telehealth: Payer: Self-pay

## 2018-04-19 ENCOUNTER — Encounter: Payer: Medicare Other | Admitting: *Deleted

## 2018-04-19 NOTE — Telephone Encounter (Signed)
LMOVM reminding pt to send remote transmission.   

## 2018-04-20 ENCOUNTER — Encounter: Payer: Self-pay | Admitting: Cardiology

## 2018-04-20 ENCOUNTER — Ambulatory Visit (INDEPENDENT_AMBULATORY_CARE_PROVIDER_SITE_OTHER): Payer: Medicare Other | Admitting: *Deleted

## 2018-04-20 DIAGNOSIS — I441 Atrioventricular block, second degree: Secondary | ICD-10-CM | POA: Diagnosis not present

## 2018-04-28 LAB — CUP PACEART INCLINIC DEVICE CHECK
Implantable Lead Implant Date: 20160112
Implantable Lead Location: 753860
Implantable Lead Model: 5076
Lead Channel Setting Pacing Pulse Width: 0.4 ms
Lead Channel Setting Sensing Sensitivity: 2 mV
MDC IDC LEAD IMPLANT DT: 20160112
MDC IDC LEAD LOCATION: 753859
MDC IDC PG IMPLANT DT: 20160112
MDC IDC SESS DTM: 20190705145205
MDC IDC SET LEADCHNL RA PACING AMPLITUDE: 1.5 V
MDC IDC SET LEADCHNL RV PACING AMPLITUDE: 2 V

## 2018-05-02 ENCOUNTER — Encounter: Payer: Self-pay | Admitting: Cardiology

## 2018-05-02 LAB — CUP PACEART REMOTE DEVICE CHECK
Battery Impedance: 156 Ohm
Brady Statistic AP VS Percent: 23 %
Brady Statistic AS VP Percent: 0 %
Brady Statistic AS VS Percent: 77 %
Date Time Interrogation Session: 20190628030845
Implantable Lead Implant Date: 20160112
Implantable Lead Location: 753859
Implantable Lead Location: 753860
Implantable Lead Model: 5076
Implantable Lead Model: 5076
Lead Channel Impedance Value: 518 Ohm
Lead Channel Pacing Threshold Amplitude: 0.875 V
Lead Channel Pacing Threshold Pulse Width: 0.4 ms
Lead Channel Pacing Threshold Pulse Width: 0.4 ms
Lead Channel Setting Sensing Sensitivity: 2 mV
MDC IDC LEAD IMPLANT DT: 20160112
MDC IDC MSMT BATTERY REMAINING LONGEVITY: 158 mo
MDC IDC MSMT BATTERY VOLTAGE: 2.78 V
MDC IDC MSMT LEADCHNL RA IMPEDANCE VALUE: 472 Ohm
MDC IDC MSMT LEADCHNL RA PACING THRESHOLD AMPLITUDE: 0.625 V
MDC IDC PG IMPLANT DT: 20160112
MDC IDC SET LEADCHNL RA PACING AMPLITUDE: 1.5 V
MDC IDC SET LEADCHNL RV PACING AMPLITUDE: 2 V
MDC IDC SET LEADCHNL RV PACING PULSEWIDTH: 0.4 ms
MDC IDC STAT BRADY AP VP PERCENT: 0 %

## 2018-05-02 NOTE — Progress Notes (Signed)
Remote pacemaker transmission.   

## 2018-05-17 ENCOUNTER — Encounter: Payer: Self-pay | Admitting: Primary Care

## 2018-05-17 ENCOUNTER — Ambulatory Visit (INDEPENDENT_AMBULATORY_CARE_PROVIDER_SITE_OTHER)
Admission: RE | Admit: 2018-05-17 | Discharge: 2018-05-17 | Disposition: A | Discharge status: Home / Self Care | Payer: Medicare Other | Source: Ambulatory Visit | Attending: Primary Care | Admitting: Primary Care

## 2018-05-17 ENCOUNTER — Ambulatory Visit (INDEPENDENT_AMBULATORY_CARE_PROVIDER_SITE_OTHER): Payer: Medicare Other | Admitting: Primary Care

## 2018-05-17 ENCOUNTER — Other Ambulatory Visit: Payer: Self-pay | Admitting: Primary Care

## 2018-05-17 VITALS — BP 118/82 | HR 68 | Temp 98.5°F | Ht 68.0 in | Wt 157.8 lb

## 2018-05-17 DIAGNOSIS — R269 Unspecified abnormalities of gait and mobility: Secondary | ICD-10-CM | POA: Diagnosis not present

## 2018-05-17 DIAGNOSIS — G8929 Other chronic pain: Secondary | ICD-10-CM | POA: Diagnosis not present

## 2018-05-17 DIAGNOSIS — M25551 Pain in right hip: Secondary | ICD-10-CM

## 2018-05-17 DIAGNOSIS — M25552 Pain in left hip: Secondary | ICD-10-CM | POA: Insufficient documentation

## 2018-05-17 MED ORDER — PREDNISONE 10 MG PO TABS: ORAL_TABLET | ORAL | 0 refills | Status: DC

## 2018-05-17 NOTE — Patient Instructions (Signed)
Start prednisone tablets. Take three tablets for 3 days, then two tablets for 3 days, then one tablet for 3 days.  Complete xray(s) prior to leaving today. I will notify you of your results once received.  You will be contacted regarding your referral for home physical therapy.  Please let us know if you have not been contacted within one week.   It was a pleasure to see you today!

## 2018-05-17 NOTE — Assessment & Plan Note (Signed)
History of fracture and osteoarthritis to right hip. Acute on chronic pain. Suspect arthritis to be cause but given dementia and increased pain, will check plain films of right hip.  Rx for short term prednisone course sent for inflammation. Referral to home health placed for further assistance with balance and range of motion. She cannot drive due to dementia and has little to no available transportation.

## 2018-05-17 NOTE — Progress Notes (Signed)
Subjective:    Patient ID: [name], female    DOB: [birth year], 84 y.o.   MRN: [id number]  HPI  [name] is an 84 year old female with a history of osteoarthritis to the right hip with right hip surgery in 2011 who presents today with a chief complaint of lower extremity pain.   Her pain is located to the right hip with radiation down proximal to right lateral knee. She does have a known bone spur to the right hip, also moderate osteoarthritis. She fell in January 2019 and was evaluated in the ED with plain films of right hip showing osteoarthritis without fracture.   Over the last 1-2 weeks she's felt more off balance. Two weeks ago she was sitting in a chair when her daughter slightly rotated her body to the right while fixing her hair. Her mother quickly guarded her right side and reacted like she was in pain.   She denies recent fall/trauma, numbness/tingling, swelling. She describes her pain as a constant achy feeling. She's been taking Tylenol and her daughter isn't sure if this is helping or not. She isn't very active during the day.    Review of Systems  Musculoskeletal: Positive for arthralgias.  Skin: Negative for color change.  Neurological: Negative for dizziness and weakness.       Past Medical History:  Diagnosis Date  . AAA (abdominal aortic aneurysm) (HCC) 01/03/13   3.5x3.3cm  . Alzheimer's dementia    "probably middle stage" (11/05/2014)  . Anemia    "hx of chronic" (11/05/2014)  . Aortic stenosis 03/29/2008   biological prosthetic replacement  . Arthritis    "joints" (11/05/2014)  . Asthma   . Chronic asthmatic bronchitis (HCC)    "q fall and winter" (11/05/2014)  . Coronary artery disease    CABG 03/29/08  . Dyslipidemia   . Exertional shortness of breath   . Family history of adverse reaction to anesthesia    "daughter gets PONV too"  . Heart murmur   . Hypertension   . Hypothyroidism   . LBBB (left bundle branch block)   . Migraines    "none in  years' (11/05/2014)  . Mobitz type 2 second degree AV block 10/31/2014  . MVP (mitral valve prolapse)    with mild mitral insufficiency/notes 08/17/2013  . PONV (postoperative nausea and vomiting)   . Presence of permanent cardiac pacemaker   . Sinus pause 10/31/2014  . Stroke [facility]) 03/2008   "2 light strokes"; denies residual on 11/05/2014  . Urinary frequency      Social History   Socioeconomic History  . Marital status: Divorced    Spouse name: Not on file  . Number of children: 1  . Years of education: Some college  . Highest education level: Not on file  Occupational History  . Occupation: Retired  Engineer, production  . Financial resource strain: Not on file  . Food insecurity:    Worry: Not on file    Inability: Not on file  . Transportation needs:    Medical: Not on file    Non-medical: Not on file  Tobacco Use  . Smoking status: Never Smoker  . Smokeless tobacco: Never Used  Substance and Sexual Activity  . Alcohol use: No    Alcohol/week: 0.0 oz  . Drug use: No  . Sexual activity: Never  Lifestyle  . Physical activity:    Days per week: Not on file    Minutes per session: Not on file  .  Stress: Not on file  Relationships  . Social connections:    Talks on phone: Not on file    Gets together: Not on file    Attends religious service: Not on file    Active member of club or organization: Not on file    Attends meetings of clubs or organizations: Not on file    Relationship status: Not on file  . Intimate partner violence:    Fear of current or ex partner: Not on file    Emotionally abused: Not on file    Physically abused: Not on file    Forced sexual activity: Not on file  Other Topics Concern  . Not on file  Social History Narrative   Lives w/ duaghter and son-in-law   Caffeine VEH:MCNO     Past Surgical History:  Procedure Laterality Date  . ABDOMINAL HYSTERECTOMY  1977  . AORTIC VALVE REPLACEMENT  03/29/2008   PERICARDIAL TISSUE VALVE  . APPENDECTOMY   ?1977  . CARDIAC CATHETERIZATION  ~ 2009  . CATARACT EXTRACTION W/ INTRAOCULAR LENS IMPLANT Left   . CORONARY ARTERY BYPASS GRAFT  03/29/2008   LIMA TO LAD,SVG TO CX,SVG TO LEFT POSTEROLATERAL BRANCH  . HIP FRACTURE SURGERY Right 06/2010   "3 metal screws"   . INSERT / REPLACE / REMOVE PACEMAKER  11/05/2014  . LOOP RECORDER EXPLANT N/A 11/05/2014   Procedure: LOOP RECORDER EXPLANT;  Surgeon: [name], MD;  Location: [facility] CATH [facility];  Service: Cardiovascular;  Laterality: N/A;  . LOOP RECORDER IMPLANT N/A 09/17/2013   Procedure: LOOP RECORDER IMPLANT;  Surgeon: [name], MD;  Location: [facility] CATH [facility];  Service: Cardiovascular;  Laterality: N/A;  . PERMANENT PACEMAKER INSERTION N/A 11/05/2014   Procedure: PERMANENT PACEMAKER INSERTION;  Surgeon: [name], MD;  Location: [facility] CATH [facility];  Service: Cardiovascular;  Laterality: N/A;  . REFRACTIVE SURGERY Bilateral    [name] 04/28/2008 (08/17/2013)  . RETINAL DETACHMENT SURGERY Left 1994   [name] 04/10/2008 (08/17/2013)  . TIBIAL TUBERCLERPLASTY  11/05/2014  . UTERINE FIBROID SURGERY  1960's    Family History  Problem Relation Age of Onset  . Heart failure Mother   . Diabetes Mother   . Hypertension Mother   . Hyperlipidemia Mother   . Melanoma Grandchild   . Colon cancer Neg Hx     Allergies  Allergen Reactions  . Codeine Nausea And Vomiting and Other (See Comments)    Patient gets violently ill  . Morphine And Related Nausea And Vomiting and Other (See Comments)    Patient get violently ill    Current Outpatient Medications on File Prior to Visit  Medication Sig Dispense Refill  . albuterol (PROVENTIL HFA;VENTOLIN HFA) 108 (90 Base) MCG/ACT inhaler Inhale 1 puff into the lungs every 6 (six) hours as needed for wheezing or shortness of breath.    Marland Kitchen alendronate (FOSAMAX) 70 MG tablet TAKE 1 TAB BY MOUTH ONCE A WEEK. ON SUNDAYS, TAKE WITH A FULL GLASS OF WATER ON AN EMPTY STOMACH. 12 tablet 1  . amLODipine (NORVASC) 10 MG tablet  Take 1 tablet (10 mg total) by mouth daily. 90 tablet 3  . amoxicillin (AMOXIL) 500 MG tablet Take 4 tablets by mouth as directed.  1  . aspirin 81 MG chewable tablet Chew 162 mg by mouth every morning.     Marland Kitchen atorvastatin (LIPITOR) 40 MG tablet Take 1 tablet by mouth every evening for cholesterol. 90 tablet 3  . donepezil (ARICEPT) 10 MG tablet Take 1 tablet by mouth  every night at bedtime for memory. 90 tablet 3  . FLUoxetine (PROZAC) 10 MG capsule Take 1 capsule (10 mg total) by mouth daily. For anxiety and depression 90 capsule 3  . Fluticasone-Salmeterol (ADVAIR) 250-50 MCG/DOSE AEPB Inhale 1 puff into the lungs 2 (two) times daily. For COPD 60 each 11  . furosemide (LASIX) 40 MG tablet Take 1 tablet (40 mg total) by mouth daily. 90 tablet 2  . levothyroxine (SYNTHROID, LEVOTHROID) 50 MCG tablet Take 1 tablet by mouth on an empty stomach with a full glass of water. 90 tablet 3  . losartan (COZAAR) 100 MG tablet TAKE 1 TABLET BY MOUTH EVERY DAY IN THE MORNING 90 tablet 1  . LUMIGAN 0.01 % SOLN Place 1 drop into the left eye at bedtime.  4  . oxybutynin (DITROPAN-XL) 5 MG 24 hr tablet Take 1 tablet (5 mg total) by mouth at bedtime. For urinary incontinence. 90 tablet 3  . ranitidine (ZANTAC 150 MAXIMUM STRENGTH) 150 MG tablet Take 150 mg by mouth 2 (two) times daily.      No current facility-administered medications on file prior to visit.     BP 118/82   Pulse 68   Temp 98.5 F (36.9 C) (Oral)   Ht 5\' 8"  (1.727 m)   Wt 157 lb 12 oz (71.6 kg)   SpO2 96%   BMI 23.99 kg/m    Objective:   Physical Exam  Constitutional: She appears well-nourished.  Cardiovascular: Normal rate.  Respiratory: Effort normal and breath sounds normal.  Musculoskeletal:       Right hip: She exhibits decreased range of motion and decreased strength. She exhibits no tenderness, no crepitus and no deformity.  Decrease in ROM with flexion and lateral rotation. Weakness with leg raise from supine position.    Skin: Skin is warm and dry.           Assessment & Plan:

## 2018-05-23 ENCOUNTER — Encounter: Payer: Self-pay | Admitting: Cardiovascular Disease

## 2018-05-26 ENCOUNTER — Telehealth: Payer: Self-pay

## 2018-05-26 NOTE — Telephone Encounter (Signed)
   Primary Cardiologist: [name], MD  Chart reviewed as part of pre-operative protocol coverage. [name] was last seen 01/2018 by Dr. Royann Shivers. Has h/o coronary artery disease s/p CABG 2009, aortic valve biological prosthesis replacement, mild mitral valve prolapse with mild mitral insufficiency, chronic diastolic heart failure, syncope due to second degree AV block Mobitz type II and a dual-chamber permanent pacemaker ([employer]tronic, 2016), small AAA, dementia, anemia, dyslipidemia, HTN, hypothyroidism, LBBB, strokes, atrial tachycardia, PAD. Last echo 01/2017 EF 60-65%, grade 2 DD, bioprosthetic AVR without obstruction or regurgitation, mild MVP/MR, moderate TR. Revised cardiac risk index is >11% indicating she is at higher risk at baseline for cardiac complications from surgery. Given memory issues listed in chart I did request patient to specify that she was OK with daughter answering questions for her. Daughter has POA but patient is still able to answer some questions appropriately. They both report the patient has not had any new cardiac symptoms and they feel she has been quite stable since last visit. Given past medical history and time since last visit, based on ACC/AHA guidelines, Syrianna S Alia would be at acceptable risk for the planned procedure without further cardiovascular testing as it does not appear this would change management at this time. Relayed to daughter that patient is at baseline higher risk for surgery purely based on comorbidities.  The clearance request we received did not specify holding of any medications at this time, therefore we are to assume all will be continued. Will route this bundled recommendation to requesting provider via Epic fax function. Please call with questions.  I will route to Dr. Royann Shivers as FYI given her complex PMH.    Laurann Montana, PA-C 05/26/2018, 12:48 PM

## 2018-05-26 NOTE — Telephone Encounter (Signed)
   [facility] Pre-operative Risk Assessment    Request for surgical clearance:  1. What type of surgery is being performed? RIGHT ANTERIOR HIP REPLACEMENT  2. When is this surgery scheduled? PENDING   3. What type of clearance is required (medical clearance vs. Pharmacy clearance to hold med vs. Both)?  MEDICAL  4. Are there any medications that need to be held prior to surgery and how long?NONE LISTED   5. Practice name and name of physician performing surgery?   [facility]  6. What is your office phone number [phone]  fax number [phone]  8.   Anesthesia type (None, local, MAC, general) ?  SPINAL   Waylan Rocher 05/26/2018, 9:40 AM  _________________________________________________________________   (provider comments below)

## 2018-06-03 ENCOUNTER — Other Ambulatory Visit: Payer: Self-pay | Admitting: Primary Care

## 2018-06-03 DIAGNOSIS — E039 Hypothyroidism, unspecified: Secondary | ICD-10-CM

## 2018-06-05 NOTE — Telephone Encounter (Signed)
Agree that her risk is moderately increased, but not prohibitive. MCr

## 2018-06-07 ENCOUNTER — Encounter: Payer: Medicare Other | Admitting: Primary Care

## 2018-06-07 ENCOUNTER — Ambulatory Visit: Payer: Medicare Other

## 2018-06-12 ENCOUNTER — Other Ambulatory Visit: Payer: Self-pay | Admitting: Primary Care

## 2018-06-12 DIAGNOSIS — F3342 Major depressive disorder, recurrent, in full remission: Secondary | ICD-10-CM

## 2018-06-19 ENCOUNTER — Other Ambulatory Visit: Payer: Self-pay | Admitting: Primary Care

## 2018-06-19 DIAGNOSIS — I10 Essential (primary) hypertension: Secondary | ICD-10-CM

## 2018-06-19 DIAGNOSIS — E7800 Pure hypercholesterolemia, unspecified: Secondary | ICD-10-CM

## 2018-06-19 DIAGNOSIS — E039 Hypothyroidism, unspecified: Secondary | ICD-10-CM

## 2018-06-20 ENCOUNTER — Ambulatory Visit (INDEPENDENT_AMBULATORY_CARE_PROVIDER_SITE_OTHER): Payer: Medicare Other | Admitting: Primary Care

## 2018-06-20 ENCOUNTER — Ambulatory Visit: Payer: Medicare Other

## 2018-06-20 ENCOUNTER — Ambulatory Visit (INDEPENDENT_AMBULATORY_CARE_PROVIDER_SITE_OTHER): Payer: Medicare Other

## 2018-06-20 VITALS — BP 110/60 | HR 82 | Temp 98.5°F | Ht 67.5 in | Wt 157.0 lb

## 2018-06-20 DIAGNOSIS — G301 Alzheimer's disease with late onset: Secondary | ICD-10-CM

## 2018-06-20 DIAGNOSIS — Z0000 Encounter for general adult medical examination without abnormal findings: Secondary | ICD-10-CM | POA: Diagnosis not present

## 2018-06-20 DIAGNOSIS — F0280 Dementia in other diseases classified elsewhere without behavioral disturbance: Secondary | ICD-10-CM

## 2018-06-20 DIAGNOSIS — J449 Chronic obstructive pulmonary disease, unspecified: Secondary | ICD-10-CM

## 2018-06-20 DIAGNOSIS — R053 Chronic cough: Secondary | ICD-10-CM

## 2018-06-20 DIAGNOSIS — I714 Abdominal aortic aneurysm, without rupture, unspecified: Secondary | ICD-10-CM

## 2018-06-20 DIAGNOSIS — E039 Hypothyroidism, unspecified: Secondary | ICD-10-CM | POA: Diagnosis not present

## 2018-06-20 DIAGNOSIS — E7800 Pure hypercholesterolemia, unspecified: Secondary | ICD-10-CM | POA: Diagnosis not present

## 2018-06-20 DIAGNOSIS — I10 Essential (primary) hypertension: Secondary | ICD-10-CM

## 2018-06-20 DIAGNOSIS — I2510 Atherosclerotic heart disease of native coronary artery without angina pectoris: Secondary | ICD-10-CM | POA: Diagnosis not present

## 2018-06-20 DIAGNOSIS — M810 Age-related osteoporosis without current pathological fracture: Secondary | ICD-10-CM

## 2018-06-20 DIAGNOSIS — F3342 Major depressive disorder, recurrent, in full remission: Secondary | ICD-10-CM

## 2018-06-20 DIAGNOSIS — R05 Cough: Secondary | ICD-10-CM

## 2018-06-20 DIAGNOSIS — M25551 Pain in right hip: Secondary | ICD-10-CM

## 2018-06-20 DIAGNOSIS — Z950 Presence of cardiac pacemaker: Secondary | ICD-10-CM

## 2018-06-20 DIAGNOSIS — G8929 Other chronic pain: Secondary | ICD-10-CM

## 2018-06-20 DIAGNOSIS — R32 Unspecified urinary incontinence: Secondary | ICD-10-CM

## 2018-06-20 LAB — LIPID PANEL
CHOL/HDL RATIO: 2
CHOLESTEROL: 137 mg/dL (ref 0–200)
HDL: 60.4 mg/dL (ref >39.00)
LDL CALC: 60 mg/dL (ref 0–99)
NonHDL: 76.59
TRIGLYCERIDES: 84 mg/dL (ref 0.0–149.0)
VLDL: 16.8 mg/dL (ref 0.0–40.0)

## 2018-06-20 LAB — COMPREHENSIVE METABOLIC PANEL
ALBUMIN: 4 g/dL (ref 3.5–5.2)
ALT: 14 U/L (ref 0–35)
AST: 21 U/L (ref 0–37)
Alkaline Phosphatase: 76 U/L (ref 39–117)
BILIRUBIN TOTAL: 1.1 mg/dL (ref 0.2–1.2)
BUN: 26 mg/dL — AB (ref 6–23)
CALCIUM: 9.4 mg/dL (ref 8.4–10.5)
CHLORIDE: 100 meq/L (ref 96–112)
CO2: 31 meq/L (ref 19–32)
Creatinine, Ser: 1.29 mg/dL — ABNORMAL HIGH (ref 0.40–1.20)
GFR: 42.07 mL/min — ABNORMAL LOW (ref >60.00)
Glucose, Bld: 98 mg/dL (ref 70–99)
Potassium: 3.6 mEq/L (ref 3.5–5.1)
SODIUM: 140 meq/L (ref 135–145)
Total Protein: 6.8 g/dL (ref 6.0–8.3)

## 2018-06-20 LAB — TSH: TSH: 1.82 u[IU]/mL (ref 0.35–4.50)

## 2018-06-20 NOTE — Assessment & Plan Note (Signed)
Exam unremarkable. Continue Advair.

## 2018-06-20 NOTE — Assessment & Plan Note (Signed)
Seems to be moderate based off of encounter today. She does have difficulty recalling recent information but does follow commands and answers some questions appropriately. Continue donepezil.   Referral placed to neurology for second opinion of dementia.

## 2018-06-20 NOTE — Assessment & Plan Note (Signed)
Stable in the office today, continue current regimen. 

## 2018-06-20 NOTE — Assessment & Plan Note (Signed)
Avascular necrosis on recent plain films, will be undergoing surgery on 07/03/18.

## 2018-06-20 NOTE — Assessment & Plan Note (Signed)
Recent TSH stable, continue levothyroxine 50 mcg.  

## 2018-06-20 NOTE — Assessment & Plan Note (Addendum)
Compliant to alendronate weekly, continue same. Repeat bone density scan in 1 year.

## 2018-06-20 NOTE — Patient Instructions (Signed)
[name] , Thank you for taking time to come for your Medicare Wellness Visit. I appreciate your ongoing commitment to your health goals. Please review the following plan we discussed and let me know if I can assist you in the future.   These are the goals we discussed: Goals    . Patient Stated     Starting 06/20/2018, I will continue to take medications as prescribed.        This is a list of the screening recommended for you and due dates:  Health Maintenance  Topic Date Due  . Flu Shot  01/24/2019*  . Tetanus Vaccine  02/18/2021  . DEXA scan (bone density measurement)  Completed  . Pneumonia vaccines  Completed  *Topic was postponed. The date shown is not the original due date.   Preventive Care for Adults  A healthy lifestyle and preventive care can promote health and wellness. Preventive health guidelines for adults include the following key practices.  . A routine yearly physical is a good way to check with your health care provider about your health and preventive screening. It is a chance to share any concerns and updates on your health and to receive a thorough exam.  . Visit your dentist for a routine exam and preventive care every 6 months. Brush your teeth twice a day and floss once a day. Good oral hygiene prevents tooth decay and gum disease.  . The frequency of eye exams is based on your age, health, family medical history, use  of contact lenses, and other factors. Follow your health care provider's recommendations for frequency of eye exams.  . Eat a healthy diet. Foods like vegetables, fruits, whole grains, low-fat dairy products, and lean protein foods contain the nutrients you need without too many calories. Decrease your intake of foods high in solid fats, added sugars, and salt. Eat the right amount of calories for you. Get information about a proper diet from your health care provider, if necessary.  . Regular physical exercise is one of the most important  things you can do for your health. Most adults should get at least 150 minutes of moderate-intensity exercise (any activity that increases your heart rate and causes you to sweat) each week. In addition, most adults need muscle-strengthening exercises on 2 or more days a week.  Silver Sneakers may be a benefit available to you. To determine eligibility, you may visit the website: www.silversneakers.com or contact program at (340) 548-5660 Mon-Fri between 8AM-8PM.   . Maintain a healthy weight. The body mass index (BMI) is a screening tool to identify possible weight problems. It provides an estimate of body fat based on height and weight. Your health care provider can find your BMI and can help you achieve or maintain a healthy weight.   For adults 20 years and older: ? A BMI below 18.5 is considered underweight. ? A BMI of 18.5 to 24.9 is normal. ? A BMI of 25 to 29.9 is considered overweight. ? A BMI of 30 and above is considered obese.   . Maintain normal blood lipids and cholesterol levels by exercising and minimizing your intake of saturated fat. Eat a balanced diet with plenty of fruit and vegetables. Blood tests for lipids and cholesterol should begin at age 45 and be repeated every 5 years. If your lipid or cholesterol levels are high, you are over 50, or you are at high risk for heart disease, you may need your cholesterol levels checked more frequently. Ongoing high  lipid and cholesterol levels should be treated with medicines if diet and exercise are not working.  . If you smoke, find out from your health care provider how to quit. If you do not use tobacco, please do not start.  . If you choose to drink alcohol, please do not consume more than 2 drinks per day. One drink is considered to be 12 ounces (355 mL) of beer, 5 ounces (148 mL) of wine, or 1.5 ounces (44 mL) of liquor.  . If you are 35-1 years old, ask your health care provider if you should take aspirin to prevent  strokes.  . Use sunscreen. Apply sunscreen liberally and repeatedly throughout the day. You should seek shade when your shadow is shorter than you. Protect yourself by wearing long sleeves, pants, a wide-brimmed hat, and sunglasses year round, whenever you are outdoors.  . Once a month, do a whole body skin exam, using a mirror to look at the skin on your back. Tell your health care provider of new moles, moles that have irregular borders, moles that are larger than a pencil eraser, or moles that have changed in shape or color.

## 2018-06-20 NOTE — Assessment & Plan Note (Signed)
Immunizations UTD. Declines mammogram and colonoscopy.  Bone density testing due next year. Recommended to work on diet, increase vegetables, fruit, whole grains, lean protein, water. Continue with regular activity as tolerated.  Exam stable. Labs stable. Follow up in 1 year for CPE.

## 2018-06-20 NOTE — Assessment & Plan Note (Signed)
Remotely checked per cardiology.

## 2018-06-20 NOTE — Progress Notes (Signed)
PCP notes:   Health maintenance:  Flu vaccine - addressed  Abnormal screenings:   Hearing - failed  Hearing Screening   125Hz  250Hz  500Hz  1000Hz  2000Hz  3000Hz  4000Hz  6000Hz  8000Hz   Right ear:   40 40 40  40    Left ear:   40 40 40  0     Fall risk - hx of multiple falls Fall Risk  06/20/2018 06/03/2017  Falls in the past year? Yes No  Comment 3 falls in December 2018; bruising to left hip and face -  Number falls in past yr: 2 or more -  Injury with Fall? Yes -  Risk Factor Category  High Fall Risk -  Risk for fall due to : History of fall(s);Impaired balance/gait -   Patient concerns:   Chronic right hip pain as result of a previous fall. Pain scale: 5/10. Pending hip replacement per daughter.   Nurse concerns:  None  Next PCP appt:   06/20/18 @ 1400

## 2018-06-20 NOTE — Assessment & Plan Note (Signed)
Overall doing well on fluoxetine 10 mg. Continue same.

## 2018-06-20 NOTE — Assessment & Plan Note (Signed)
Doing well on oxybutynin, continue same.  

## 2018-06-20 NOTE — Assessment & Plan Note (Signed)
Denies. Lungs clear. Continue to monitor.

## 2018-06-20 NOTE — Assessment & Plan Note (Signed)
Following with cardiology, repeat ultrasound scheduled for October 2019.

## 2018-06-20 NOTE — Assessment & Plan Note (Signed)
Recent lipid panel stable and LDL at goal. Continue atorvastatin 40 mg.

## 2018-06-20 NOTE — Patient Instructions (Signed)
You will be contacted regarding your referral to Neurology.  Please let us know if you have not been contacted within one week.   Continue regular activity as tolerated.  Increase vegetables, fruit, whole grains, lean protein, water.  Ensure you are consuming 64 ounces of water daily.  Please schedule a follow up appointment in 6 months for re-evaluation or sooner if needed.   It was a pleasure to see you today!   Preventive Care 50 Years and Older, Female Preventive care refers to lifestyle choices and visits with your health care provider that can promote health and wellness. What does preventive care include?  A yearly physical exam. This is also called an annual well check.  Dental exams once or twice a year.  Routine eye exams. Ask your health care provider how often you should have your eyes checked.  Personal lifestyle choices, including: ? Daily care of your teeth and gums. ? Regular physical activity. ? Eating a healthy diet. ? Avoiding tobacco and drug use. ? Limiting alcohol use. ? Practicing safe sex. ? Taking low-dose aspirin every day. ? Taking vitamin and mineral supplements as recommended by your health care provider. What happens during an annual well check? The services and screenings done by your health care provider during your annual well check will depend on your age, overall health, lifestyle risk factors, and family history of disease. Counseling Your health care provider may ask you questions about your:  Alcohol use.  Tobacco use.  Drug use.  Emotional well-being.  Home and relationship well-being.  Sexual activity.  Eating habits.  History of falls.  Memory and ability to understand (cognition).  Work and work Statistician.  Reproductive health.  Screening You may have the following tests or measurements:  Height, weight, and BMI.  Blood pressure.  Lipid and cholesterol levels. These may be checked every 5 years, or more  frequently if you are over 44 years old.  Skin check.  Lung cancer screening. You may have this screening every year starting at age 72 if you have a 30-pack-year history of smoking and currently smoke or have quit within the past 15 years.  Fecal occult blood test (FOBT) of the stool. You may have this test every year starting at age 58.  Flexible sigmoidoscopy or colonoscopy. You may have a sigmoidoscopy every 5 years or a colonoscopy every 10 years starting at age 82.  Hepatitis C blood test.  Hepatitis B blood test.  Sexually transmitted disease (STD) testing.  Diabetes screening. This is done by checking your blood sugar (glucose) after you have not eaten for a while (fasting). You may have this done every 1-3 years.  Bone density scan. This is done to screen for osteoporosis. You may have this done starting at age 18.  Mammogram. This may be done every 1-2 years. Talk to your health care provider about how often you should have regular mammograms.  Talk with your health care provider about your test results, treatment options, and if necessary, the need for more tests. Vaccines Your health care provider may recommend certain vaccines, such as:  Influenza vaccine. This is recommended every year.  Tetanus, diphtheria, and acellular pertussis (Tdap, Td) vaccine. You may need a Td booster every 10 years.  Varicella vaccine. You may need this if you have not been vaccinated.  Zoster vaccine. You may need this after age 41.  Measles, mumps, and rubella (MMR) vaccine. You may need at least one dose of MMR if you were  born in [birth year] or later. You may also need a second dose.  Pneumococcal 13-valent conjugate (PCV13) vaccine. One dose is recommended after age 21.  Pneumococcal polysaccharide (PPSV23) vaccine. One dose is recommended after age 75.  Meningococcal vaccine. You may need this if you have certain conditions.  Hepatitis A vaccine. You may need this if you have certain  conditions or if you travel or work in places where you may be exposed to hepatitis A.  Hepatitis B vaccine. You may need this if you have certain conditions or if you travel or work in places where you may be exposed to hepatitis B.  Haemophilus influenzae type b (Hib) vaccine. You may need this if you have certain conditions.  Talk to your health care provider about which screenings and vaccines you need and how often you need them. This information is not intended to replace advice given to you by your health care provider. Make sure you discuss any questions you have with your health care provider. Document Released: 11/07/2015 Document Revised: 06/30/2016 Document Reviewed: 08/12/2015 Elsevier Interactive Patient Education  [employer].

## 2018-06-20 NOTE — Assessment & Plan Note (Signed)
Following with cardiology, asymptomatic.  Continue BP control, statin.

## 2018-06-20 NOTE — Progress Notes (Signed)
Subjective:   [name] is a 84 y.o. female who presents for Medicare Annual (Subsequent) preventive examination.  Review of Systems:  N/A Cardiac Risk Factors include: advanced age (>39men, >84 women);dyslipidemia;hypertension     Objective:     Vitals: BP 110/60 (BP Location: Left Arm, Patient Position: Sitting, Cuff Size: Normal)   Pulse 82   Temp 98.5 F (36.9 C) (Oral)   Ht 5' 7.5" (1.715 m) Comment: shoes  Wt 157 lb (71.2 kg)   SpO2 97%   BMI 24.23 kg/m   Body mass index is 24.23 kg/m.  Advanced Directives 06/20/2018 11/05/2014 09/17/2013 08/17/2013  Does Patient Have a Medical Advance Directive? Yes No Patient does not have advance directive Patient does not have advance directive;Patient would not like information  Type of Public librarian Power of [place];Living will - - -  Copy of Healthcare Power of Attorney in Chart? Yes - - -  Would patient like information on creating a medical advance directive? - Yes - Educational materials given - -  Pre-existing out of facility DNR order (yellow form or pink MOST form) - - No -    Tobacco Social History   Tobacco Use  Smoking Status Never Smoker  Smokeless Tobacco Never Used     Counseling given: No   Clinical Intake:  Pre-visit preparation completed: Yes  Pain : 0-10 Pain Score: 5  Pain Type: Chronic pain Pain Location: Hip Pain Orientation: Right Pain Onset: More than a month ago Pain Frequency: Constant     Nutritional Status: BMI 25 -29 Overweight Nutritional Risks: None Diabetes: No  How often do you need to have someone help you when you read instructions, pamphlets, or other written materials from your doctor or pharmacy?: 1 - Never What is the last grade level you completed in school?: 12th grade  Interpreter Needed?: No  Comments: pt lives with daughter Information entered by :: LPinson, LPN  Past Medical History:  Diagnosis Date  . AAA (abdominal aortic aneurysm)  (HCC) 01/03/13   3.5x3.3cm  . Alzheimer's dementia    "probably middle stage" (11/05/2014)  . Anemia    "hx of chronic" (11/05/2014)  . Aortic stenosis 03/29/2008   biological prosthetic replacement  . Arthritis    "joints" (11/05/2014)  . Asthma   . Chronic asthmatic bronchitis (HCC)    "q fall and winter" (11/05/2014)  . Coronary artery disease    CABG 03/29/08  . Dyslipidemia   . Exertional shortness of breath   . Family history of adverse reaction to anesthesia    "daughter gets PONV too"  . Heart murmur   . Hypertension   . Hypothyroidism   . LBBB (left bundle branch block)   . Migraines    "none in years' (11/05/2014)  . Mobitz type 2 second degree AV block 10/31/2014  . MVP (mitral valve prolapse)    with mild mitral insufficiency/notes 08/17/2013  . PONV (postoperative nausea and vomiting)   . Presence of permanent cardiac pacemaker   . Sinus pause 10/31/2014  . Stroke [facility]) 03/2008   "2 light strokes"; denies residual on 11/05/2014  . Urinary frequency    Past Surgical History:  Procedure Laterality Date  . ABDOMINAL HYSTERECTOMY  1977  . AORTIC VALVE REPLACEMENT  03/29/2008   PERICARDIAL TISSUE VALVE  . APPENDECTOMY  ?1977  . CARDIAC CATHETERIZATION  ~ 2009  . CATARACT EXTRACTION W/ INTRAOCULAR LENS IMPLANT Left   . CORONARY ARTERY BYPASS GRAFT  03/29/2008   LIMA TO  LAD,SVG TO CX,SVG TO LEFT POSTEROLATERAL BRANCH  . HIP FRACTURE SURGERY Right 06/2010   "3 metal screws"   . INSERT / REPLACE / REMOVE PACEMAKER  11/05/2014  . LOOP RECORDER EXPLANT N/A 11/05/2014   Procedure: LOOP RECORDER EXPLANT;  Surgeon: [name], MD;  Location: [facility];  Service: Cardiovascular;  Laterality: N/A;  . LOOP RECORDER IMPLANT N/A 09/17/2013   Procedure: LOOP RECORDER IMPLANT;  Surgeon: [name], MD;  Location: [facility];  Service: Cardiovascular;  Laterality: N/A;  . PERMANENT PACEMAKER INSERTION N/A 11/05/2014   Procedure: PERMANENT PACEMAKER INSERTION;  Surgeon: [name],  MD;  Location: [facility];  Service: Cardiovascular;  Laterality: N/A;  . REFRACTIVE SURGERY Bilateral    [name] 04/28/2008 (08/17/2013)  . RETINAL DETACHMENT SURGERY Left 1994   [name] 04/10/2008 (08/17/2013)  . TIBIAL TUBERCLERPLASTY  11/05/2014  . UTERINE FIBROID SURGERY  1960's   Family History  Problem Relation Age of Onset  . Heart failure Mother   . Diabetes Mother   . Hypertension Mother   . Hyperlipidemia Mother   . Melanoma Grandchild   . Colon cancer Neg Hx    Social History   Socioeconomic History  . Marital status: Divorced    Spouse name: Not on file  . Number of children: 1  . Years of education: Some college  . Highest education level: Not on file  Occupational History  . Occupation: Retired  Engineer, production  . Financial resource strain: Not on file  . Food insecurity:    Worry: Not on file    Inability: Not on file  . Transportation needs:    Medical: Not on file    Non-medical: Not on file  Tobacco Use  . Smoking status: Never Smoker  . Smokeless tobacco: Never Used  Substance and Sexual Activity  . Alcohol use: No    Alcohol/week: 0.0 standard drinks  . Drug use: No  . Sexual activity: Never  Lifestyle  . Physical activity:    Days per week: Not on file    Minutes per session: Not on file  . Stress: Not on file  Relationships  . Social connections:    Talks on phone: Not on file    Gets together: Not on file    Attends religious service: Not on file    Active member of club or organization: Not on file    Attends meetings of clubs or organizations: Not on file    Relationship status: Not on file  Other Topics Concern  . Not on file  Social History Narrative   Lives w/ duaghter and son-in-law   Caffeine ZOX:WRUE     Outpatient Encounter Medications as of 06/20/2018  Medication Sig  . albuterol (PROVENTIL HFA;VENTOLIN HFA) 108 (90 Base) MCG/ACT inhaler Inhale 1 puff into the lungs every 6 (six) hours as needed for wheezing or shortness of  breath.  Marland Kitchen alendronate (FOSAMAX) 70 MG tablet TAKE 1 TAB BY MOUTH ONCE A WEEK. ON SUNDAYS, TAKE WITH A FULL GLASS OF WATER ON AN EMPTY STOMACH.  Marland Kitchen amLODipine (NORVASC) 10 MG tablet Take 1 tablet (10 mg total) by mouth daily.  Marland Kitchen amoxicillin (AMOXIL) 500 MG tablet Take 4 tablets by mouth as directed.  Marland Kitchen aspirin 81 MG chewable tablet Chew 162 mg by mouth every morning.   Marland Kitchen atorvastatin (LIPITOR) 40 MG tablet Take 1 tablet by mouth every evening for cholesterol.  . donepezil (ARICEPT) 10 MG tablet Take 1 tablet by mouth every night at  bedtime for memory.  Marland Kitchen FLUoxetine (PROZAC) 10 MG capsule TAKE 1 CAPSULE (10 MG TOTAL) BY MOUTH DAILY. FOR ANXIETY AND DEPRESSION  . Fluticasone-Salmeterol (ADVAIR) 250-50 MCG/DOSE AEPB Inhale 1 puff into the lungs 2 (two) times daily. For COPD  . furosemide (LASIX) 40 MG tablet Take 1 tablet (40 mg total) by mouth daily.  Marland Kitchen levothyroxine (SYNTHROID, LEVOTHROID) 50 MCG tablet TAKE 1 TABLET BY MOUTH ON AN EMPTY STOMACH WITH A FULL GLASS OF WATER.  Marland Kitchen losartan (COZAAR) 100 MG tablet TAKE 1 TABLET BY MOUTH EVERY DAY IN THE MORNING  . LUMIGAN 0.01 % SOLN Place 1 drop into the left eye at bedtime.  Marland Kitchen oxybutynin (DITROPAN-XL) 5 MG 24 hr tablet Take 1 tablet (5 mg total) by mouth at bedtime. For urinary incontinence.  . ranitidine (ZANTAC 150 MAXIMUM STRENGTH) 150 MG tablet Take 150 mg by mouth 2 (two) times daily.   . predniSONE (DELTASONE) 10 MG tablet Take three tablets for 3 days, then two tablets for 3 days, then one tablet for 3 days. (Patient not taking: Reported on 06/20/2018)   No facility-administered encounter medications on file as of 06/20/2018.     Activities of Daily Living In your present state of health, do you have any difficulty performing the following activities: 06/20/2018  Hearing? Y  Vision? N  Difficulty concentrating or making decisions? Y  Walking or climbing stairs? Y  Dressing or bathing? N  Doing errands, shopping? Y  Preparing Food and eating  ? Y  Using the Toilet? N  In the past six months, have you accidently leaked urine? Y  Do you have problems with loss of bowel control? N  Managing your Medications? Y  Managing your Finances? Y  Housekeeping or managing your Housekeeping? Y  Some recent data might be hidden    Patient Care Team: Doreene Nest, NP as PCP - General (Internal Medicine) [name], MD as PCP - Cardiology (Cardiology)    Assessment:   This is a routine wellness examination for [name].   Hearing Screening   125Hz  250Hz  500Hz  1000Hz  2000Hz  3000Hz  4000Hz  6000Hz  8000Hz   Right ear:   40 40 40  40    Left ear:   40 40 40  0    Vision Screening Comments: Vision exam on Mar 07, 2018 @ [facility]    Exercise Activities and Dietary recommendations Current Exercise Habits: The patient does not participate in regular exercise at present, Exercise limited by: orthopedic condition(s)  Goals    . Patient Stated     Starting 06/20/2018, I will continue to take medications as prescribed.        Fall Risk Fall Risk  06/20/2018 06/03/2017  Falls in the past year? Yes No  Comment 3 falls in December 2018; bruising to left hip and face -  Number falls in past yr: 2 or more -  Injury with Fall? Yes -  Risk Factor Category  High Fall Risk -  Risk for fall due to : History of fall(s);Impaired balance/gait -   Depression Screen PHQ 2/9 Scores 06/20/2018 06/03/2017  PHQ - 2 Score 0 0  PHQ- 9 Score 0 -     Cognitive Function - DX of Dementia MMSE - Mini Mental State Exam 06/20/2018 12/16/2016 11/15/2016  Not completed: (No Data) - -  Orientation to time - 4 5  Orientation to Place - 5 5  Registration - 3 3  Attention/ Calculation - 5 5  Recall - 1 2  Language- name 2 objects - 2 2  Language- repeat - 1 1  Language- follow 3 step command - 3 3  Language- read & follow direction - 1 1  Write a sentence - 1 1  Copy design - 1 1  Total score - 27 29     Immunization History  Administered  Date(s) Administered  . Influenza,inj,Quad PF,6+ Mos 08/18/2013  . Influenza-Unspecified 07/25/2014  . Pneumococcal Conjugate-13 05/14/2014  . Pneumococcal Polysaccharide-23 08/04/2010  . Tdap 02/19/2011  . Zoster 12/15/2006   Screening Tests Health Maintenance  Topic Date Due  . INFLUENZA VACCINE  01/24/2019 (Originally 05/25/2018)  . TETANUS/TDAP  02/18/2021  . DEXA SCAN  Completed  . PNA vac Low Risk Adult  Completed     Plan:     I have personally reviewed, addressed, and noted the following in the patient's chart:  A. Medical and social history B. Use of alcohol, tobacco or illicit drugs  C. Current medications and supplements D. Functional ability and status E.  Nutritional status F.  Physical activity G. Advance directives H. List of other physicians I.  Hospitalizations, surgeries, and ER visits in previous 12 months J.  Vitals K. Screenings to include hearing, vision, cognitive, depression L. Referrals and appointments - none  In addition, I have reviewed and discussed with patient certain preventive protocols, quality metrics, and best practice recommendations. A written personalized care plan for preventive services as well as general preventive health recommendations were provided to patient.  See attached scanned questionnaire for additional information.   Signed,   Randa Evens, MHA, BS, LPN Health Coach

## 2018-06-20 NOTE — Progress Notes (Signed)
Subjective:    Patient ID: [name], female    DOB: 1934/04/21, 84 y.o.   MRN: [id number]  HPI  [name] is an 84 year old female who presents today for complete physical. She saw our health advisor earlier today. Her daughter is with her who is assisting with her HPI as she has trouble remembering.   Her daughter's main concern today focuses around her mother's dementia. They last saw the neurologist in early 2018 and didn't feel like they received answers in regards to the staging of her dementia. Several of her relatives have had extensive testing including MRI's so they feel she's not had enough of a work up for a definitve diagnosis. They are interested in a second opinion.   Immunizations: -Tetanus: Completed in 2012 -Influenza: Due this season -Pneumonia: Completed Prevnar in 2015, Pneumovax in 2011 -Shingles: Completed in 2008  Diet: She endorses a healthy diet Breakfast: Cereal, fruit Lunch: Skips sometimes if she eats later breakfast, left overs, pizza, fast food Dinner: Sandwich, pasta, meat, some vegetables  Snacks: Occasionally  Desserts: Cookies, cakes. Daily sweets.  Beverages: Diet soda, water, occasionally coffee and sweet tea.  Exercise: She will walk up and down her sidewalk Eye exam: Completed in May 2019 Dental exam: Completes semi-annually  Colonoscopy: Completed in 2006, declines Dexa: Completed in 2018 Mammogram: Completed in 2017, declines  BP Readings from Last 3 Encounters:  06/20/18 110/60  06/20/18 110/60  05/17/18 118/82     Review of Systems  Constitutional: Negative for unexpected weight change.  HENT: Negative for rhinorrhea.   Respiratory: Negative for cough and shortness of breath.   Cardiovascular: Negative for chest pain.  Gastrointestinal: Negative for constipation and diarrhea.  Genitourinary: Negative for difficulty urinating.  Musculoskeletal: Positive for arthralgias.       Chronic right hip pain  Skin: Negative for  rash.  Allergic/Immunologic: Negative for environmental allergies.  Neurological: Negative for dizziness, numbness and headaches.       Inability to remember short term facts and information per her daughter.  Psychiatric/Behavioral:       Some sadness recently as she recently lost her sister.       Past Medical History:  Diagnosis Date  . AAA (abdominal aortic aneurysm) (HCC) 01/03/13   3.5x3.3cm  . Alzheimer's dementia    "probably middle stage" (11/05/2014)  . Anemia    "hx of chronic" (11/05/2014)  . Aortic stenosis 03/29/2008   biological prosthetic replacement  . Arthritis    "joints" (11/05/2014)  . Asthma   . Chronic asthmatic bronchitis (HCC)    "q fall and winter" (11/05/2014)  . Coronary artery disease    CABG 03/29/08  . Dyslipidemia   . Exertional shortness of breath   . Family history of adverse reaction to anesthesia    "daughter gets PONV too"  . Heart murmur   . Hypertension   . Hypothyroidism   . LBBB (left bundle branch block)   . Migraines    "none in years' (11/05/2014)  . Mobitz type 2 second degree AV block 10/31/2014  . MVP (mitral valve prolapse)    with mild mitral insufficiency/notes 08/17/2013  . PONV (postoperative nausea and vomiting)   . Presence of permanent cardiac pacemaker   . Sinus pause 10/31/2014  . Stroke [facility]) 03/2008   "2 light strokes"; denies residual on 11/05/2014  . Urinary frequency      Social History   Socioeconomic History  . Marital status: Divorced    Spouse  name: Not on file  . Number of children: 1  . Years of education: Some college  . Highest education level: Not on file  Occupational History  . Occupation: Retired  Engineer, production  . Financial resource strain: Not on file  . Food insecurity:    Worry: Not on file    Inability: Not on file  . Transportation needs:    Medical: Not on file    Non-medical: Not on file  Tobacco Use  . Smoking status: Never Smoker  . Smokeless tobacco: Never Used  Substance and Sexual  Activity  . Alcohol use: No    Alcohol/week: 0.0 standard drinks  . Drug use: No  . Sexual activity: Never  Lifestyle  . Physical activity:    Days per week: Not on file    Minutes per session: Not on file  . Stress: Not on file  Relationships  . Social connections:    Talks on phone: Not on file    Gets together: Not on file    Attends religious service: Not on file    Active member of club or organization: Not on file    Attends meetings of clubs or organizations: Not on file    Relationship status: Not on file  . Intimate partner violence:    Fear of current or ex partner: Not on file    Emotionally abused: Not on file    Physically abused: Not on file    Forced sexual activity: Not on file  Other Topics Concern  . Not on file  Social History Narrative   Lives w/ duaghter and son-in-law   Caffeine NLG:XQJJ     Past Surgical History:  Procedure Laterality Date  . ABDOMINAL HYSTERECTOMY  1977  . AORTIC VALVE REPLACEMENT  03/29/2008   PERICARDIAL TISSUE VALVE  . APPENDECTOMY  ?1977  . CARDIAC CATHETERIZATION  ~ 2009  . CATARACT EXTRACTION W/ INTRAOCULAR LENS IMPLANT Left   . CORONARY ARTERY BYPASS GRAFT  03/29/2008   LIMA TO LAD,SVG TO CX,SVG TO LEFT POSTEROLATERAL BRANCH  . HIP FRACTURE SURGERY Right 06/2010   "3 metal screws"   . INSERT / REPLACE / REMOVE PACEMAKER  11/05/2014  . LOOP RECORDER EXPLANT N/A 11/05/2014   Procedure: LOOP RECORDER EXPLANT;  Surgeon: [name], MD;  Location: [facility];  Service: Cardiovascular;  Laterality: N/A;  . LOOP RECORDER IMPLANT N/A 09/17/2013   Procedure: LOOP RECORDER IMPLANT;  Surgeon: [name], MD;  Location: [facility];  Service: Cardiovascular;  Laterality: N/A;  . PERMANENT PACEMAKER INSERTION N/A 11/05/2014   Procedure: PERMANENT PACEMAKER INSERTION;  Surgeon: [name], MD;  Location: [facility];  Service: Cardiovascular;  Laterality: N/A;  . REFRACTIVE SURGERY Bilateral    [name] 04/28/2008 (08/17/2013)  .  RETINAL DETACHMENT SURGERY Left 1994   [name] 04/10/2008 (08/17/2013)  . TIBIAL TUBERCLERPLASTY  11/05/2014  . UTERINE FIBROID SURGERY  1960's    Family History  Problem Relation Age of Onset  . Heart failure Mother   . Diabetes Mother   . Hypertension Mother   . Hyperlipidemia Mother   . Melanoma Grandchild   . Colon cancer Neg Hx     Allergies  Allergen Reactions  . Codeine Nausea And Vomiting and Other (See Comments)    Patient gets violently ill  . Morphine And Related Nausea And Vomiting and Other (See Comments)    Patient get violently ill    Current Outpatient Medications on File Prior to Visit  Medication Sig  Dispense Refill  . albuterol (PROVENTIL HFA;VENTOLIN HFA) 108 (90 Base) MCG/ACT inhaler Inhale 1 puff into the lungs every 6 (six) hours as needed for wheezing or shortness of breath.    Marland Kitchen alendronate (FOSAMAX) 70 MG tablet TAKE 1 TAB BY MOUTH ONCE A WEEK. ON SUNDAYS, TAKE WITH A FULL GLASS OF WATER ON AN EMPTY STOMACH. 12 tablet 1  . amLODipine (NORVASC) 10 MG tablet Take 1 tablet (10 mg total) by mouth daily. 90 tablet 3  . amoxicillin (AMOXIL) 500 MG tablet Take 4 tablets by mouth as directed.  1  . aspirin 81 MG chewable tablet Chew 162 mg by mouth every morning.     Marland Kitchen atorvastatin (LIPITOR) 40 MG tablet Take 1 tablet by mouth every evening for cholesterol. 90 tablet 3  . donepezil (ARICEPT) 10 MG tablet Take 1 tablet by mouth every night at bedtime for memory. 90 tablet 3  . FLUoxetine (PROZAC) 10 MG capsule TAKE 1 CAPSULE (10 MG TOTAL) BY MOUTH DAILY. FOR ANXIETY AND DEPRESSION 90 capsule 1  . Fluticasone-Salmeterol (ADVAIR) 250-50 MCG/DOSE AEPB Inhale 1 puff into the lungs 2 (two) times daily. For COPD 60 each 11  . furosemide (LASIX) 40 MG tablet Take 1 tablet (40 mg total) by mouth daily. 90 tablet 2  . levothyroxine (SYNTHROID, LEVOTHROID) 50 MCG tablet TAKE 1 TABLET BY MOUTH ON AN EMPTY STOMACH WITH A FULL GLASS OF WATER. 90 tablet 0  . losartan (COZAAR) 100  MG tablet TAKE 1 TABLET BY MOUTH EVERY DAY IN THE MORNING 90 tablet 1  . LUMIGAN 0.01 % SOLN Place 1 drop into the left eye at bedtime.  4  . oxybutynin (DITROPAN-XL) 5 MG 24 hr tablet Take 1 tablet (5 mg total) by mouth at bedtime. For urinary incontinence. 90 tablet 3  . ranitidine (ZANTAC 150 MAXIMUM STRENGTH) 150 MG tablet Take 150 mg by mouth 2 (two) times daily.      No current facility-administered medications on file prior to visit.     BP 110/60 (BP Location: Left Arm, Patient Position: Sitting, Cuff Size: Normal)   Pulse 82   Temp 98.5 F (36.9 C) (Oral)   Ht 5' 7.5" (1.715 m) Comment: shoes  Wt 157 lb (71.2 kg)   SpO2 97%   BMI 24.23 kg/m    Objective:   Physical Exam  Constitutional: She is oriented to person, place, and time. She appears well-nourished.  HENT:  Mouth/Throat: No oropharyngeal exudate.  Eyes: Pupils are equal, round, and reactive to light. EOM are normal.  Neck: Neck supple. No thyromegaly present.  Cardiovascular: Normal rate and regular rhythm.  Murmur heard. Respiratory: Effort normal and breath sounds normal.  GI: Soft. Bowel sounds are normal. There is no tenderness.  Musculoskeletal:       Right hip: She exhibits decreased range of motion. She exhibits normal strength.  Chronic right hip pain  Neurological: She is alert and oriented to person, place, and time.  Skin: Skin is warm and dry.  Psychiatric: She has a normal mood and affect.           Assessment & Plan:

## 2018-06-21 ENCOUNTER — Encounter: Payer: Self-pay | Admitting: Neurology

## 2018-06-22 ENCOUNTER — Other Ambulatory Visit: Payer: Self-pay | Admitting: Orthopedic Surgery

## 2018-06-24 NOTE — Progress Notes (Signed)
I reviewed health advisor's note, was available for consultation, and agree with documentation and plan.  

## 2018-06-25 HISTORY — PX: OTHER SURGICAL HISTORY: SHX169

## 2018-06-28 ENCOUNTER — Inpatient Hospital Stay (HOSPITAL_COMMUNITY): Admission: RE | Admit: 2018-06-28 | Payer: Medicare Other | Source: Ambulatory Visit

## 2018-07-03 ENCOUNTER — Inpatient Hospital Stay (HOSPITAL_COMMUNITY): Admission: RE | Admit: 2018-07-03 | Payer: Medicare Other | Source: Ambulatory Visit | Admitting: Orthopedic Surgery

## 2018-07-03 ENCOUNTER — Encounter (HOSPITAL_COMMUNITY): Admission: RE | Payer: Self-pay | Source: Ambulatory Visit

## 2018-07-03 SURGERY — ARTHROPLASTY, HIP, TOTAL, ANTERIOR APPROACH
Anesthesia: Spinal | Site: Hip | Laterality: Right

## 2018-07-13 ENCOUNTER — Other Ambulatory Visit: Payer: Self-pay | Admitting: Orthopedic Surgery

## 2018-07-13 ENCOUNTER — Other Ambulatory Visit: Payer: Self-pay | Admitting: Primary Care

## 2018-07-13 DIAGNOSIS — E785 Hyperlipidemia, unspecified: Secondary | ICD-10-CM

## 2018-07-14 ENCOUNTER — Other Ambulatory Visit: Payer: Self-pay | Admitting: Primary Care

## 2018-07-14 DIAGNOSIS — J449 Chronic obstructive pulmonary disease, unspecified: Secondary | ICD-10-CM

## 2018-07-25 ENCOUNTER — Other Ambulatory Visit: Payer: Self-pay | Admitting: Orthopedic Surgery

## 2018-07-25 NOTE — Progress Notes (Addendum)
Cardiac clearance , Dayna Dunn PA-C, tele note epic 05-26-18   Pacemaker device orders on chart   EKG 02-01-18 epic   Last device check 03-2018  AAA duplex 03-2017  ECHO 01-26-17 epic

## 2018-07-25 NOTE — Patient Instructions (Signed)
[name]  07/25/2018   Your procedure is scheduled on: 07-31-18   Report to [facility] Main  Entrance    Report to admitting at 12:15PM    Call this number if you have problems the morning of surgery [phone]     Remember: Do not eat food After Midnight. YOU MAY HAVE CLEAR LIQUIDS FROM MIDNIGHT UNTIL 8:45AM. NOTHING BY MOUTH AFTER 8:45AM! BRUSH YOUR TEETH MORNING OF SURGERY AND RINSE YOUR MOUTH OUT, NO CHEWING GUM CANDY OR MINTS.      CLEAR LIQUID DIET   Foods Allowed                                                                     Foods Excluded  Coffee and tea, regular and decaf                             liquids that you cannot  Plain Jell-O in any flavor                                             see through such as: Fruit ices (not with fruit pulp)                                     milk, soups, orange juice  Iced Popsicles                                    All solid food Carbonated beverages, regular and diet                                    Cranberry, grape and apple juices Sports drinks like Gatorade Lightly seasoned clear broth or consume(fat free) Sugar, honey syrup  Sample Menu Breakfast                                Lunch                                     Supper Cranberry juice                    Beef broth                            Chicken broth Jell-O                                     Grape juice  Apple juice Coffee or tea                        Jell-O                                      Popsicle                                                Coffee or tea                        Coffee or tea  _____________________________________________________________________       Take these medicines the morning of surgery with A SIP OF WATER: amlodipine, zyrtec, fluoxetine, levothyroxine, ranitidine, wixela inhaler                                 You may not have any metal on your body  including hair pins and              piercings  Do not wear jewelry, make-up, lotions, powders or perfumes, deodorant             Do not wear nail polish.  Do not shave  48 hours prior to surgery.                Do not bring valuables to the hospital. [facility] IS NOT             RESPONSIBLE   FOR VALUABLES.  Contacts, dentures or bridgework may not be worn into surgery.  Leave suitcase in the car. After surgery it may be brought to your room.                 Please read over the following fact sheets you were given: _____________________________________________________________________             [facility] - Preparing for Surgery Before surgery, you can play an important role.  Because skin is not sterile, your skin needs to be as free of germs as possible.  You can reduce the number of germs on your skin by washing with CHG (chlorahexidine gluconate) soap before surgery.  CHG is an antiseptic cleaner which kills germs and bonds with the skin to continue killing germs even after washing. Please DO NOT use if you have an allergy to CHG or antibacterial soaps.  If your skin becomes reddened/irritated stop using the CHG and inform your nurse when you arrive at Short Stay. Do not shave (including legs and underarms) for at least 48 hours prior to the first CHG shower.  You may shave your face/neck. Please follow these instructions carefully:  1.  Shower with CHG Soap the night before surgery and the  morning of Surgery.  2.  If you choose to wash your hair, wash your hair first as usual with your  normal  shampoo.  3.  After you shampoo, rinse your hair and body thoroughly to remove the  shampoo.                           4.  Use CHG as you would  any other liquid soap.  You can apply chg directly  to the skin and wash                       Gently with a scrungie or clean washcloth.  5.  Apply the CHG Soap to your body ONLY FROM THE NECK DOWN.   Do not use on face/ open                            Wound or open sores. Avoid contact with eyes, ears mouth and genitals (private parts).                       Wash face,  Genitals (private parts) with your normal soap.             6.  Wash thoroughly, paying special attention to the area where your surgery  will be performed.  7.  Thoroughly rinse your body with warm water from the neck down.  8.  DO NOT shower/wash with your normal soap after using and rinsing off  the CHG Soap.                9.  Pat yourself dry with a clean towel.            10.  Wear clean pajamas.            11.  Place clean sheets on your bed the night of your first shower and do not  sleep with pets. Day of Surgery : Do not apply any lotions/deodorants the morning of surgery.  Please wear clean clothes to the hospital/surgery center.  FAILURE TO FOLLOW THESE INSTRUCTIONS MAY RESULT IN THE CANCELLATION OF YOUR SURGERY PATIENT SIGNATURE_________________________________  NURSE SIGNATURE__________________________________  ________________________________________________________________________   Rogelia MireIncentive Spirometer  An incentive spirometer is a tool that can help keep your lungs clear and active. This tool measures how well you are filling your lungs with each breath. Taking long deep breaths may help reverse or decrease the chance of developing breathing (pulmonary) problems (especially infection) following:  A long period of time when you are unable to move or be active. BEFORE THE PROCEDURE   If the spirometer includes an indicator to show your best effort, your nurse or respiratory therapist will set it to a desired goal.  If possible, sit up straight or lean slightly forward. Try not to slouch.  Hold the incentive spirometer in an upright position. INSTRUCTIONS FOR USE  1. Sit on the edge of your bed if possible, or sit up as far as you can in bed or on a chair. 2. Hold the incentive spirometer in an upright position. 3. Breathe out  normally. 4. Place the mouthpiece in your mouth and seal your lips tightly around it. 5. Breathe in slowly and as deeply as possible, raising the piston or the ball toward the top of the column. 6. Hold your breath for 3-5 seconds or for as long as possible. Allow the piston or ball to fall to the bottom of the column. 7. Remove the mouthpiece from your mouth and breathe out normally. 8. Rest for a few seconds and repeat Steps 1 through 7 at least 10 times every 1-2 hours when you are awake. Take your time and take a few normal breaths between deep breaths. 9. The spirometer may include an indicator to show your best effort. Use the indicator  as a goal to work toward during each repetition. 10. After each set of 10 deep breaths, practice coughing to be sure your lungs are clear. If you have an incision (the cut made at the time of surgery), support your incision when coughing by placing a pillow or rolled up towels firmly against it. Once you are able to get out of bed, walk around indoors and cough well. You may stop using the incentive spirometer when instructed by your caregiver.  RISKS AND COMPLICATIONS  Take your time so you do not get dizzy or light-headed.  If you are in pain, you may need to take or ask for pain medication before doing incentive spirometry. It is harder to take a deep breath if you are having pain. AFTER USE  Rest and breathe slowly and easily.  It can be helpful to keep track of a log of your progress. Your caregiver can provide you with a simple table to help with this. If you are using the spirometer at home, follow these instructions: SEEK MEDICAL CARE IF:   You are having difficultly using the spirometer.  You have trouble using the spirometer as often as instructed.  Your pain medication is not giving enough relief while using the spirometer.  You develop fever of 100.5 F (38.1 C) or higher. SEEK IMMEDIATE MEDICAL CARE IF:   You cough up bloody sputum  that had not been present before.  You develop fever of 102 F (38.9 C) or greater.  You develop worsening pain at or near the incision site. MAKE SURE YOU:   Understand these instructions.  Will watch your condition.  Will get help right away if you are not doing well or get worse. Document Released: 02/21/2007 Document Revised: 01/03/2012 Document Reviewed: 04/24/2007 [facility] Patient Information 2014 [place], [place].   ________________________________________________________________________

## 2018-07-26 ENCOUNTER — Encounter (HOSPITAL_COMMUNITY)
Admission: RE | Admit: 2018-07-26 | Discharge: 2018-07-26 | Disposition: A | Discharge status: Home / Self Care | Payer: Medicare Other | Source: Ambulatory Visit | Attending: Orthopedic Surgery | Admitting: Orthopedic Surgery

## 2018-07-26 ENCOUNTER — Ambulatory Visit (HOSPITAL_COMMUNITY)
Admission: RE | Admit: 2018-07-26 | Discharge: 2018-07-26 | Disposition: A | Discharge status: Home / Self Care | Payer: Medicare Other | Source: Ambulatory Visit | Attending: Orthopedic Surgery | Admitting: Orthopedic Surgery

## 2018-07-26 ENCOUNTER — Other Ambulatory Visit: Payer: Self-pay

## 2018-07-26 ENCOUNTER — Encounter (HOSPITAL_COMMUNITY): Payer: Self-pay

## 2018-07-26 DIAGNOSIS — Z01818 Encounter for other preprocedural examination: Secondary | ICD-10-CM

## 2018-07-26 DIAGNOSIS — Z7982 Long term (current) use of aspirin: Secondary | ICD-10-CM | POA: Insufficient documentation

## 2018-07-26 DIAGNOSIS — Z952 Presence of prosthetic heart valve: Secondary | ICD-10-CM | POA: Insufficient documentation

## 2018-07-26 DIAGNOSIS — Z950 Presence of cardiac pacemaker: Secondary | ICD-10-CM | POA: Diagnosis not present

## 2018-07-26 DIAGNOSIS — Z951 Presence of aortocoronary bypass graft: Secondary | ICD-10-CM | POA: Insufficient documentation

## 2018-07-26 LAB — PROTIME-INR
INR: 0.92
Prothrombin Time: 12.3 seconds (ref 11.4–15.2)

## 2018-07-26 LAB — CBC WITH DIFFERENTIAL/PLATELET
Basophils Absolute: 0 10*3/uL (ref 0.0–0.1)
Basophils Relative: 1 %
Eosinophils Absolute: 0.1 10*3/uL (ref 0.0–0.7)
Eosinophils Relative: 2 %
HEMATOCRIT: 40.1 % (ref 36.0–46.0)
HEMOGLOBIN: 13.5 g/dL (ref 12.0–15.0)
LYMPHS ABS: 0.6 10*3/uL — AB (ref 0.7–4.0)
LYMPHS PCT: 14 %
MCH: 30.8 pg (ref 26.0–34.0)
MCHC: 33.7 g/dL (ref 30.0–36.0)
MCV: 91.6 fL (ref 78.0–100.0)
Monocytes Absolute: 0.5 10*3/uL (ref 0.1–1.0)
Monocytes Relative: 10 %
NEUTROS PCT: 73 %
Neutro Abs: 3.2 10*3/uL (ref 1.7–7.7)
Platelets: 199 10*3/uL (ref 150–400)
RBC: 4.38 MIL/uL (ref 3.87–5.11)
RDW: 13.2 % (ref 11.5–15.5)
WBC: 4.4 10*3/uL (ref 4.0–10.5)

## 2018-07-26 LAB — URINALYSIS, ROUTINE W REFLEX MICROSCOPIC
BACTERIA UA: NONE SEEN
Bilirubin Urine: NEGATIVE
GLUCOSE, UA: NEGATIVE mg/dL
HGB URINE DIPSTICK: NEGATIVE
Ketones, ur: NEGATIVE mg/dL
NITRITE: NEGATIVE
Protein, ur: NEGATIVE mg/dL
SPECIFIC GRAVITY, URINE: 1.008 (ref 1.005–1.030)
pH: 7 (ref 5.0–8.0)

## 2018-07-26 LAB — APTT: aPTT: 29 seconds (ref 24–36)

## 2018-07-26 LAB — ABO/RH: ABO/RH(D): A POS

## 2018-07-26 LAB — SURGICAL PCR SCREEN
MRSA, PCR: NEGATIVE
Staphylococcus aureus: NEGATIVE

## 2018-07-28 DIAGNOSIS — M87051 Idiopathic aseptic necrosis of right femur: Secondary | ICD-10-CM

## 2018-07-28 HISTORY — DX: Idiopathic aseptic necrosis of right femur: M87.051

## 2018-07-28 NOTE — H&P (Signed)
TOTAL HIP ADMISSION H&P  Patient is admitted for right total hip arthroplasty.  Subjective:  Chief Complaint: right hip pain  HPI: [name], 84 y.o. female, has a history of pain and functional disability in the right hip(s) due to trauma and AVN and patient has failed non-surgical conservative treatments for greater than 12 weeks to include NSAID's and/or analgesics, use of assistive devices and activity modification.  Onset of symptoms was gradual starting 1 years ago with gradually worsening course since that time.The patient noted prior procedures of the hip to include hip pinning with canulated hip screws on the right hip(s).  Patient currently rates pain in the right hip at 10 out of 10 with activity. Patient has night pain, worsening of pain with activity and weight bearing, trendelenberg gait, pain that interfers with activities of daily living and pain with passive range of motion. Patient has evidence of joint space narrowing and AVN by imaging studies. This condition presents safety issues increasing the risk of falls.    There is no current active infection.  Patient Active Problem List   Diagnosis Date Noted  . Preventative health care 06/20/2018  . Chronic pain of right hip 05/17/2018  . Altered gait 12/05/2017  . Neuropathic pain 12/05/2017  . Medicare annual wellness visit, subsequent 06/03/2017  . Chronic cough 05/17/2017  . Pacemaker 01/15/2017  . Major depressive disorder 11/15/2016  . Urinary incontinence 11/15/2016  . COPD (chronic obstructive pulmonary disease) (HCC) 11/15/2016  . Osteoporosis 11/15/2016  . PAT (paroxysmal atrial tachycardia) (HCC) 01/21/2016  . Second degree AV block, Mobitz type II 11/05/2014  . Sinus pause 10/31/2014  . Mobitz type 2 second degree AV block 10/31/2014  . Near syncope 08/17/2013  . AAA 04/02/2013  . S/P AVR 2009 [facility] Ease 21 mm bovine pericardial 04/02/2013  . CAD (coronary artery disease) s/p CABG - 2009 (LIMA  to LAD, SVG to LCX, SVG to L-PLV 04/02/2013  . HTN (hypertension) 04/02/2013  . Hyperlipidemia 04/02/2013  . MVP (mitral valve prolapse) with mild mitral insufficiency 04/02/2013  . Dementia (HCC) 04/02/2013  . Hypothyroidism 04/02/2013  . Bradycardia 04/02/2013   Past Medical History:  Diagnosis Date  . AAA (abdominal aortic aneurysm) (HCC) 01/03/13   3.5x3.3cm  . Alzheimer's dementia (HCC)    "probably middle stage" (11/05/2014)  . Anemia    "hx of chronic" (11/05/2014)  . Aortic stenosis 03/29/2008   biological prosthetic replacement  . Arthritis    "joints" (11/05/2014)  . Asthma   . Chronic asthmatic bronchitis (HCC)    "q fall and winter" (11/05/2014)  . Coronary artery disease    CABG 03/29/08  . Dyslipidemia   . Exertional shortness of breath   . Family history of adverse reaction to anesthesia    "daughter gets PONV too"  . Heart murmur   . Hypertension   . Hypothyroidism   . LBBB (left bundle branch block)   . Migraines    "none in years' (11/05/2014)  . Mobitz type 2 second degree AV block 10/31/2014  . MVP (mitral valve prolapse)    with mild mitral insufficiency/notes 08/17/2013  . PONV (postoperative nausea and vomiting)   . Presence of permanent cardiac pacemaker   . Sinus pause 10/31/2014  . Stroke [facility]) 03/2008   "2 light strokes"; denies residual on 11/05/2014  . Urinary frequency     Past Surgical History:  Procedure Laterality Date  . ABDOMINAL HYSTERECTOMY  1977  . AORTIC VALVE REPLACEMENT  03/29/2008  PERICARDIAL TISSUE VALVE  . APPENDECTOMY  ?1977  . CARDIAC CATHETERIZATION  ~ 2009  . CATARACT EXTRACTION W/ INTRAOCULAR LENS IMPLANT Left   . CORONARY ARTERY BYPASS GRAFT  03/29/2008   LIMA TO LAD,SVG TO CX,SVG TO LEFT POSTEROLATERAL BRANCH  . HIP FRACTURE SURGERY Right 06/2010   "3 metal screws"   . INSERT / REPLACE / REMOVE PACEMAKER  11/05/2014  . LOOP RECORDER EXPLANT N/A 11/05/2014   Procedure: LOOP RECORDER EXPLANT;  Surgeon: [name], MD;   Location: [facility] CATH LAB;  Service: Cardiovascular;  Laterality: N/A;  . LOOP RECORDER IMPLANT N/A 09/17/2013   Procedure: LOOP RECORDER IMPLANT;  Surgeon: [name], MD;  Location: [facility] CATH LAB;  Service: Cardiovascular;  Laterality: N/A;  . PERMANENT PACEMAKER INSERTION N/A 11/05/2014   Procedure: PERMANENT PACEMAKER INSERTION;  Surgeon: [name], MD;  Location: [facility] CATH LAB;  Service: Cardiovascular;  Laterality: N/A;  . REFRACTIVE SURGERY Bilateral    [name] 04/28/2008 (08/17/2013)  . RETINAL DETACHMENT SURGERY Left 1994   [name] 04/10/2008 (08/17/2013)  . TIBIAL TUBERCLERPLASTY  11/05/2014  . tooth pulled   06/2018  . UTERINE FIBROID SURGERY  1960's    No current facility-administered medications for this encounter.    Current Outpatient Medications  Medication Sig Dispense Refill Last Dose  . alendronate (FOSAMAX) 70 MG tablet TAKE 1 TAB BY MOUTH ONCE A WEEK. ON SUNDAYS, TAKE WITH A FULL GLASS OF WATER ON AN EMPTY STOMACH. (Patient taking differently: Take 70 mg by mouth every Monday. ) 12 tablet 1 Taking  . amLODipine (NORVASC) 10 MG tablet Take 1 tablet (10 mg total) by mouth daily. 90 tablet 3 Taking  . amoxicillin (AMOXIL) 500 MG tablet Take 2,000 mg by mouth See admin instructions. Take 2,000mg  by mouth 1 hour prior to dental appointments  1 Taking  . aspirin 81 MG EC tablet Take 162 mg by mouth daily.    Taking  . atorvastatin (LIPITOR) 40 MG tablet TAKE 1 TABLET BY MOUTH EVERY DAY IN THE EVENING FOR CHOLESTEROL (Patient taking differently: Take 40 mg by mouth every evening. TAKE 1 TABLET BY MOUTH EVERY DAY IN THE EVENING FOR CHOLESTEROL) 90 tablet 3   . cetirizine (ZYRTEC) 10 MG tablet Take 10 mg by mouth daily.     Marland Kitchen donepezil (ARICEPT) 10 MG tablet Take 1 tablet by mouth every night at bedtime for memory. (Patient taking differently: Take 10 mg by mouth at bedtime. ) 90 tablet 3 Taking  . FLUoxetine (PROZAC) 10 MG capsule TAKE 1 CAPSULE (10 MG TOTAL) BY MOUTH DAILY. FOR  ANXIETY AND DEPRESSION 90 capsule 1 Taking  . furosemide (LASIX) 40 MG tablet Take 1 tablet (40 mg total) by mouth daily. 90 tablet 2 Taking  . levothyroxine (SYNTHROID, LEVOTHROID) 50 MCG tablet TAKE 1 TABLET BY MOUTH ON AN EMPTY STOMACH WITH A FULL GLASS OF WATER. (Patient taking differently: Take 50 mcg by mouth daily before breakfast. Take 1 tablet by mouth on an empty stomach with a full glass of water.) 90 tablet 0 Taking  . losartan (COZAAR) 100 MG tablet TAKE 1 TABLET BY MOUTH EVERY DAY IN THE MORNING (Patient taking differently: Take 100 mg by mouth daily. ) 90 tablet 1 Taking  . LUMIGAN 0.01 % SOLN Place 1 drop into the left eye at bedtime.  4 Taking  . oxybutynin (DITROPAN-XL) 5 MG 24 hr tablet Take 1 tablet (5 mg total) by mouth at bedtime. For urinary incontinence. 90 tablet 3 Taking  . ranitidine (ZANTAC  150 MAXIMUM STRENGTH) 150 MG tablet Take 150 mg by mouth 2 (two) times daily.    Taking  . WIXELA INHUB 250-50 MCG/DOSE AEPB INHALE 1 PUFF INTO THE LUNGS 2 (TWO) TIMES DAILY. FOR COPD (Patient taking differently: Inhale 1 puff into the lungs 2 (two) times daily. ) 180 each 3   . brimonidine (ALPHAGAN) 0.2 % ophthalmic solution Place 1 drop into the left eye 2 (two) times daily.  3    Allergies  Allergen Reactions  . Codeine Nausea And Vomiting and Other (See Comments)    Patient gets violently ill  . Morphine And Related Nausea And Vomiting and Other (See Comments)    Patient get violently ill    Social History   Tobacco Use  . Smoking status: Never Smoker  . Smokeless tobacco: Never Used  Substance Use Topics  . Alcohol use: No    Alcohol/week: 0.0 standard drinks    Family History  Problem Relation Age of Onset  . Heart failure Mother   . Diabetes Mother   . Hypertension Mother   . Hyperlipidemia Mother   . Melanoma Grandchild   . Colon cancer Neg Hx      Review of Systems  Constitutional: Positive for weight loss.  HENT: Positive for hearing loss.   Eyes:  Negative.   Respiratory: Negative.   Cardiovascular:       Murmur and HTN  Gastrointestinal:       Poor appetite  Genitourinary: Positive for frequency.  Musculoskeletal: Positive for joint pain and myalgias.  Skin: Negative.   Neurological: Negative.   Endo/Heme/Allergies: Negative.   Psychiatric/Behavioral: Positive for depression.    Objective:  Physical Exam  Constitutional: She is oriented to person, place, and time. She appears well-developed and well-nourished.  HENT:  Head: Normocephalic and atraumatic.  Eyes: Pupils are equal, round, and reactive to light.  Neck: Normal range of motion. Neck supple.  Cardiovascular: Intact distal pulses.  Respiratory: Effort normal.  Musculoskeletal: She exhibits tenderness.  Has significant pain with any internal or external rotation of the right hip.  She ambulates with a walker with an antalgic gait.  Skin is intact.  Neurovascular intact distally.  Foot tap is negative.    Neurological: She is alert and oriented to person, place, and time.  Skin: Skin is warm and dry.  Psychiatric: She has a normal mood and affect. Her behavior is normal. Judgment and thought content normal.    Vital signs in last 24 hours:    Labs:   Estimated body mass index is 23.11 kg/m as calculated from the following:   Height as of 07/26/18: 5\' 8"  (1.727 m).   Weight as of 07/26/18: 68.9 kg.   Imaging Review X-rays were available on the [facility] system are reviewed showing the aforementioned posttraumatic AVN of the right femoral head.  The screws are in good position.  They are not loose.  They appear to be Synthes cannulated screws.  There is partial collapse of the superior femoral head.  1 of the screws is now within about 4 mm of the joint itself on the AP view.    Preoperative templating of the joint replacement has been completed, documented, and submitted to the Operating Room personnel in order to optimize intra-operative equipment  management.     Assessment/Plan:  Post traumatic AVN, right hip(s)  The patient history, physical examination, clinical judgement of the provider and imaging studies are consistent with end stage degenerative joint disease of the right  hip(s) and total hip arthroplasty is deemed medically necessary. The treatment options including medical management, injection therapy, arthroscopy and arthroplasty were discussed at length. The risks and benefits of total hip arthroplasty were presented and reviewed. The risks due to aseptic loosening, infection, stiffness, dislocation/subluxation,  thromboembolic complications and other imponderables were discussed.  The patient acknowledged the explanation, agreed to proceed with the plan and consent was signed. Patient is being admitted for inpatient treatment for surgery, pain control, PT, OT, prophylactic antibiotics, VTE prophylaxis, progressive ambulation and ADL's and discharge planning.The patient is planning to be discharged home with home health services.

## 2018-07-30 MED ORDER — BUPIVACAINE LIPOSOME 1.3 % IJ SUSP
20.0000 mL | Freq: Once | INTRAMUSCULAR | Status: DC
  Filled 2018-07-30: qty 20

## 2018-07-30 MED ORDER — TRANEXAMIC ACID 1000 MG/10ML IV SOLN
2000.0000 mg | INTRAVENOUS | Status: DC
  Filled 2018-07-30: qty 20

## 2018-07-31 ENCOUNTER — Other Ambulatory Visit: Payer: Self-pay

## 2018-07-31 ENCOUNTER — Inpatient Hospital Stay (HOSPITAL_COMMUNITY): Payer: Medicare Other | Admitting: Anesthesiology

## 2018-07-31 ENCOUNTER — Inpatient Hospital Stay (HOSPITAL_COMMUNITY): Payer: Medicare Other

## 2018-07-31 ENCOUNTER — Ambulatory Visit (INDEPENDENT_AMBULATORY_CARE_PROVIDER_SITE_OTHER): Payer: Medicare Other | Admitting: *Deleted

## 2018-07-31 ENCOUNTER — Encounter (HOSPITAL_COMMUNITY)
Admission: RE | Disposition: A | Discharge status: Home Health | Payer: Self-pay | Source: Other Acute Inpatient Hospital | Attending: Orthopedic Surgery

## 2018-07-31 ENCOUNTER — Telehealth: Payer: Self-pay | Admitting: Cardiology

## 2018-07-31 ENCOUNTER — Encounter (HOSPITAL_COMMUNITY): Payer: Self-pay

## 2018-07-31 ENCOUNTER — Inpatient Hospital Stay (HOSPITAL_COMMUNITY)
Admission: RE | Admit: 2018-07-31 | Discharge: 2018-08-02 | DRG: 470 | Disposition: A | Discharge status: Home Health | Payer: Medicare Other | Source: Other Acute Inpatient Hospital | Attending: Orthopedic Surgery | Admitting: Orthopedic Surgery

## 2018-07-31 DIAGNOSIS — Z885 Allergy status to narcotic agent status: Secondary | ICD-10-CM

## 2018-07-31 DIAGNOSIS — M87251 Osteonecrosis due to previous trauma, right femur: Principal | ICD-10-CM | POA: Diagnosis present

## 2018-07-31 DIAGNOSIS — F329 Major depressive disorder, single episode, unspecified: Secondary | ICD-10-CM | POA: Diagnosis present

## 2018-07-31 DIAGNOSIS — Z419 Encounter for procedure for purposes other than remedying health state, unspecified: Secondary | ICD-10-CM

## 2018-07-31 DIAGNOSIS — E039 Hypothyroidism, unspecified: Secondary | ICD-10-CM | POA: Diagnosis present

## 2018-07-31 DIAGNOSIS — Z7983 Long term (current) use of bisphosphonates: Secondary | ICD-10-CM

## 2018-07-31 DIAGNOSIS — M87051 Idiopathic aseptic necrosis of right femur: Secondary | ICD-10-CM | POA: Diagnosis present

## 2018-07-31 DIAGNOSIS — Z952 Presence of prosthetic heart valve: Secondary | ICD-10-CM

## 2018-07-31 DIAGNOSIS — H9190 Unspecified hearing loss, unspecified ear: Secondary | ICD-10-CM | POA: Diagnosis present

## 2018-07-31 DIAGNOSIS — G309 Alzheimer's disease, unspecified: Secondary | ICD-10-CM | POA: Diagnosis present

## 2018-07-31 DIAGNOSIS — I714 Abdominal aortic aneurysm, without rupture: Secondary | ICD-10-CM | POA: Diagnosis present

## 2018-07-31 DIAGNOSIS — M1611 Unilateral primary osteoarthritis, right hip: Secondary | ICD-10-CM | POA: Diagnosis present

## 2018-07-31 DIAGNOSIS — Z8679 Personal history of other diseases of the circulatory system: Secondary | ICD-10-CM

## 2018-07-31 DIAGNOSIS — M791 Myalgia, unspecified site: Secondary | ICD-10-CM | POA: Diagnosis present

## 2018-07-31 DIAGNOSIS — Z950 Presence of cardiac pacemaker: Secondary | ICD-10-CM | POA: Diagnosis not present

## 2018-07-31 DIAGNOSIS — Z7982 Long term (current) use of aspirin: Secondary | ICD-10-CM

## 2018-07-31 DIAGNOSIS — K219 Gastro-esophageal reflux disease without esophagitis: Secondary | ICD-10-CM | POA: Diagnosis present

## 2018-07-31 DIAGNOSIS — F0280 Dementia in other diseases classified elsewhere without behavioral disturbance: Secondary | ICD-10-CM | POA: Diagnosis present

## 2018-07-31 DIAGNOSIS — J449 Chronic obstructive pulmonary disease, unspecified: Secondary | ICD-10-CM | POA: Diagnosis present

## 2018-07-31 DIAGNOSIS — Z95818 Presence of other cardiac implants and grafts: Secondary | ICD-10-CM

## 2018-07-31 DIAGNOSIS — R32 Unspecified urinary incontinence: Secondary | ICD-10-CM | POA: Diagnosis present

## 2018-07-31 DIAGNOSIS — I2510 Atherosclerotic heart disease of native coronary artery without angina pectoris: Secondary | ICD-10-CM | POA: Diagnosis present

## 2018-07-31 DIAGNOSIS — I441 Atrioventricular block, second degree: Secondary | ICD-10-CM | POA: Diagnosis present

## 2018-07-31 DIAGNOSIS — Z79899 Other long term (current) drug therapy: Secondary | ICD-10-CM

## 2018-07-31 DIAGNOSIS — R55 Syncope and collapse: Secondary | ICD-10-CM | POA: Diagnosis not present

## 2018-07-31 DIAGNOSIS — D62 Acute posthemorrhagic anemia: Secondary | ICD-10-CM | POA: Diagnosis not present

## 2018-07-31 DIAGNOSIS — Z951 Presence of aortocoronary bypass graft: Secondary | ICD-10-CM

## 2018-07-31 DIAGNOSIS — E785 Hyperlipidemia, unspecified: Secondary | ICD-10-CM | POA: Diagnosis present

## 2018-07-31 DIAGNOSIS — I080 Rheumatic disorders of both mitral and aortic valves: Secondary | ICD-10-CM | POA: Diagnosis present

## 2018-07-31 DIAGNOSIS — I10 Essential (primary) hypertension: Secondary | ICD-10-CM | POA: Diagnosis present

## 2018-07-31 DIAGNOSIS — M810 Age-related osteoporosis without current pathological fracture: Secondary | ICD-10-CM | POA: Diagnosis present

## 2018-07-31 DIAGNOSIS — I447 Left bundle-branch block, unspecified: Secondary | ICD-10-CM | POA: Diagnosis present

## 2018-07-31 DIAGNOSIS — Z9181 History of falling: Secondary | ICD-10-CM

## 2018-07-31 DIAGNOSIS — Z8673 Personal history of transient ischemic attack (TIA), and cerebral infarction without residual deficits: Secondary | ICD-10-CM

## 2018-07-31 HISTORY — PX: TOTAL HIP ARTHROPLASTY: SHX124

## 2018-07-31 LAB — BASIC METABOLIC PANEL
ANION GAP: 9 (ref 5–15)
BUN: 18 mg/dL (ref 8–23)
CALCIUM: 9 mg/dL (ref 8.9–10.3)
CO2: 29 mmol/L (ref 22–32)
CREATININE: 1 mg/dL (ref 0.44–1.00)
Chloride: 105 mmol/L (ref 98–111)
GFR calc non Af Amer: 51 mL/min — ABNORMAL LOW (ref >60)
GFR, EST AFRICAN AMERICAN: 60 mL/min — AB (ref >60)
Glucose, Bld: 108 mg/dL — ABNORMAL HIGH (ref 70–99)
Potassium: 2.8 mmol/L — ABNORMAL LOW (ref 3.5–5.1)
Sodium: 143 mmol/L (ref 135–145)

## 2018-07-31 LAB — TYPE AND SCREEN
ABO/RH(D): A POS
Antibody Screen: NEGATIVE

## 2018-07-31 SURGERY — ARTHROPLASTY, HIP, TOTAL, ANTERIOR APPROACH
Anesthesia: Spinal | Site: Hip | Laterality: Right

## 2018-07-31 MED ORDER — AMLODIPINE BESYLATE 10 MG PO TABS
10.0000 mg | ORAL_TABLET | Freq: Every day | ORAL | Status: DC
  Filled 2018-07-31: qty 1

## 2018-07-31 MED ORDER — LEVOTHYROXINE SODIUM 50 MCG PO TABS
50.0000 ug | ORAL_TABLET | Freq: Every day | ORAL | Status: DC
  Administered 2018-08-01 – 2018-08-02 (×2): 50 ug via ORAL
  Filled 2018-07-31 (×2): qty 1

## 2018-07-31 MED ORDER — ALUM & MAG HYDROXIDE-SIMETH 200-200-20 MG/5ML PO SUSP: 30.0000 mL | ORAL | Status: DC | PRN

## 2018-07-31 MED ORDER — BUPIVACAINE IN DEXTROSE 0.75-8.25 % IT SOLN
INTRATHECAL | Status: DC | PRN
  Administered 2018-07-31: 1.6 mL via INTRATHECAL

## 2018-07-31 MED ORDER — FENTANYL CITRATE (PF) 100 MCG/2ML IJ SOLN
25.0000 ug | INTRAMUSCULAR | Status: DC | PRN
  Administered 2018-07-31: 25 ug via INTRAVENOUS

## 2018-07-31 MED ORDER — ONDANSETRON HCL 4 MG/2ML IJ SOLN
INTRAMUSCULAR | Status: AC
  Filled 2018-07-31: qty 2

## 2018-07-31 MED ORDER — DIPHENHYDRAMINE HCL 12.5 MG/5ML PO ELIX: 12.5000 mg | ORAL_SOLUTION | ORAL | Status: DC | PRN

## 2018-07-31 MED ORDER — ONDANSETRON HCL 4 MG/2ML IJ SOLN
INTRAMUSCULAR | Status: DC | PRN
  Administered 2018-07-31: 4 mg via INTRAVENOUS

## 2018-07-31 MED ORDER — SODIUM CHLORIDE 0.9% FLUSH
INTRAVENOUS | Status: DC | PRN
  Administered 2018-07-31: 50 mL via INTRAVENOUS

## 2018-07-31 MED ORDER — ONDANSETRON HCL 4 MG PO TABS: 4.0000 mg | ORAL_TABLET | Freq: Four times a day (QID) | ORAL | Status: DC | PRN

## 2018-07-31 MED ORDER — CHLORHEXIDINE GLUCONATE 4 % EX LIQD: 60.0000 mL | Freq: Once | CUTANEOUS | Status: DC

## 2018-07-31 MED ORDER — PHENOL 1.4 % MT LIQD
1.0000 | OROMUCOSAL | Status: DC | PRN
  Filled 2018-07-31: qty 177

## 2018-07-31 MED ORDER — HYDROMORPHONE HCL 1 MG/ML IJ SOLN: 0.5000 mg | INTRAMUSCULAR | Status: DC | PRN

## 2018-07-31 MED ORDER — KCL IN DEXTROSE-NACL 20-5-0.45 MEQ/L-%-% IV SOLN
INTRAVENOUS | Status: DC
  Administered 2018-07-31 – 2018-08-01 (×4): via INTRAVENOUS
  Filled 2018-07-31 (×5): qty 1000

## 2018-07-31 MED ORDER — METHOCARBAMOL 500 MG IVPB - SIMPLE MED
500.0000 mg | Freq: Four times a day (QID) | INTRAVENOUS | Status: DC | PRN
  Administered 2018-07-31: 500 mg via INTRAVENOUS
  Filled 2018-07-31: qty 50

## 2018-07-31 MED ORDER — ASPIRIN EC 81 MG PO TBEC: 81.0000 mg | DELAYED_RELEASE_TABLET | Freq: Two times a day (BID) | ORAL | 0 refills | Status: DC

## 2018-07-31 MED ORDER — LIDOCAINE 2% (20 MG/ML) 5 ML SYRINGE
INTRAMUSCULAR | Status: DC | PRN
  Administered 2018-07-31 (×2): 20 mg via INTRAVENOUS

## 2018-07-31 MED ORDER — MOMETASONE FURO-FORMOTEROL FUM 200-5 MCG/ACT IN AERO
2.0000 | INHALATION_SPRAY | Freq: Two times a day (BID) | RESPIRATORY_TRACT | Status: DC
  Administered 2018-08-01 – 2018-08-02 (×2): 2 via RESPIRATORY_TRACT
  Filled 2018-07-31: qty 8.8

## 2018-07-31 MED ORDER — PROPOFOL 500 MG/50ML IV EMUL
INTRAVENOUS | Status: DC | PRN
  Administered 2018-07-31: 50 ug/kg/min via INTRAVENOUS

## 2018-07-31 MED ORDER — LACTATED RINGERS IV SOLN
INTRAVENOUS | Status: DC
  Administered 2018-07-31: 1000 mL via INTRAVENOUS
  Administered 2018-07-31: 15:00:00 via INTRAVENOUS

## 2018-07-31 MED ORDER — LIDOCAINE HCL (CARDIAC) PF 100 MG/5ML IV SOSY
PREFILLED_SYRINGE | INTRAVENOUS | Status: AC
  Filled 2018-07-31: qty 5

## 2018-07-31 MED ORDER — OXYCODONE-ACETAMINOPHEN 5-325 MG PO TABS: 1.0000 | ORAL_TABLET | ORAL | 0 refills | Status: DC | PRN

## 2018-07-31 MED ORDER — METOCLOPRAMIDE HCL 5 MG PO TABS: 5.0000 mg | ORAL_TABLET | Freq: Three times a day (TID) | ORAL | Status: DC | PRN

## 2018-07-31 MED ORDER — FENTANYL CITRATE (PF) 100 MCG/2ML IJ SOLN
INTRAMUSCULAR | Status: AC
  Administered 2018-07-31: 25 ug via INTRAVENOUS
  Filled 2018-07-31: qty 2

## 2018-07-31 MED ORDER — DEXAMETHASONE SODIUM PHOSPHATE 10 MG/ML IJ SOLN
INTRAMUSCULAR | Status: DC | PRN
  Administered 2018-07-31: 10 mg via INTRAVENOUS

## 2018-07-31 MED ORDER — MENTHOL 3 MG MT LOZG: 1.0000 | LOZENGE | OROMUCOSAL | Status: DC | PRN

## 2018-07-31 MED ORDER — BUPIVACAINE-EPINEPHRINE (PF) 0.5% -1:200000 IJ SOLN
INTRAMUSCULAR | Status: AC
  Filled 2018-07-31: qty 30

## 2018-07-31 MED ORDER — TIZANIDINE HCL 2 MG PO TABS: 2.0000 mg | ORAL_TABLET | Freq: Four times a day (QID) | ORAL | 0 refills | Status: DC | PRN

## 2018-07-31 MED ORDER — LOSARTAN POTASSIUM 50 MG PO TABS
100.0000 mg | ORAL_TABLET | Freq: Every day | ORAL | Status: DC
  Administered 2018-07-31: 100 mg via ORAL
  Filled 2018-07-31 (×2): qty 2

## 2018-07-31 MED ORDER — DEXAMETHASONE SODIUM PHOSPHATE 10 MG/ML IJ SOLN
INTRAMUSCULAR | Status: AC
  Filled 2018-07-31: qty 1

## 2018-07-31 MED ORDER — ONDANSETRON HCL 4 MG/2ML IJ SOLN: 4.0000 mg | Freq: Four times a day (QID) | INTRAMUSCULAR | Status: DC | PRN

## 2018-07-31 MED ORDER — TRANEXAMIC ACID 1000 MG/10ML IV SOLN
1000.0000 mg | INTRAVENOUS | Status: AC
  Administered 2018-07-31: 1000 mg via INTRAVENOUS
  Filled 2018-07-31: qty 10

## 2018-07-31 MED ORDER — DOCUSATE SODIUM 100 MG PO CAPS
100.0000 mg | ORAL_CAPSULE | Freq: Two times a day (BID) | ORAL | Status: DC
  Administered 2018-07-31 – 2018-08-02 (×4): 100 mg via ORAL
  Filled 2018-07-31 (×4): qty 1

## 2018-07-31 MED ORDER — BUPIVACAINE-EPINEPHRINE (PF) 0.5% -1:200000 IJ SOLN
INTRAMUSCULAR | Status: DC | PRN
  Administered 2018-07-31: 20 mL via PERINEURAL

## 2018-07-31 MED ORDER — TRANEXAMIC ACID 1000 MG/10ML IV SOLN
INTRAVENOUS | Status: DC | PRN
  Administered 2018-07-31: 2000 mg via TOPICAL

## 2018-07-31 MED ORDER — CELECOXIB 200 MG PO CAPS
200.0000 mg | ORAL_CAPSULE | Freq: Two times a day (BID) | ORAL | Status: DC
  Administered 2018-07-31 – 2018-08-02 (×4): 200 mg via ORAL
  Filled 2018-07-31 (×4): qty 1

## 2018-07-31 MED ORDER — 0.9 % SODIUM CHLORIDE (POUR BTL) OPTIME
TOPICAL | Status: DC | PRN
  Administered 2018-07-31: 1000 mL

## 2018-07-31 MED ORDER — STERILE WATER FOR IRRIGATION IR SOLN
Status: DC | PRN
  Administered 2018-07-31: 2000 mL

## 2018-07-31 MED ORDER — OXYBUTYNIN CHLORIDE ER 5 MG PO TB24
5.0000 mg | ORAL_TABLET | Freq: Every day | ORAL | Status: DC
  Administered 2018-07-31 – 2018-08-01 (×2): 5 mg via ORAL
  Filled 2018-07-31 (×2): qty 1

## 2018-07-31 MED ORDER — TRANEXAMIC ACID 1000 MG/10ML IV SOLN
1000.0000 mg | Freq: Once | INTRAVENOUS | Status: AC
  Administered 2018-07-31: 1000 mg via INTRAVENOUS
  Filled 2018-07-31: qty 1000

## 2018-07-31 MED ORDER — METHOCARBAMOL 500 MG PO TABS: 500.0000 mg | ORAL_TABLET | Freq: Four times a day (QID) | ORAL | Status: DC | PRN

## 2018-07-31 MED ORDER — FUROSEMIDE 40 MG PO TABS
40.0000 mg | ORAL_TABLET | Freq: Every day | ORAL | Status: DC
  Filled 2018-07-31: qty 1

## 2018-07-31 MED ORDER — LORATADINE 10 MG PO TABS
10.0000 mg | ORAL_TABLET | Freq: Every day | ORAL | Status: DC
  Administered 2018-08-01 – 2018-08-02 (×2): 10 mg via ORAL
  Filled 2018-07-31 (×2): qty 1

## 2018-07-31 MED ORDER — ACETAMINOPHEN 325 MG PO TABS
325.0000 mg | ORAL_TABLET | Freq: Four times a day (QID) | ORAL | Status: DC | PRN
  Administered 2018-08-02: 650 mg via ORAL
  Filled 2018-07-31: qty 2

## 2018-07-31 MED ORDER — DONEPEZIL HCL 10 MG PO TABS
10.0000 mg | ORAL_TABLET | Freq: Every day | ORAL | Status: DC
  Administered 2018-07-31 – 2018-08-01 (×2): 10 mg via ORAL
  Filled 2018-07-31 (×2): qty 1

## 2018-07-31 MED ORDER — METHOCARBAMOL 500 MG IVPB - SIMPLE MED
INTRAVENOUS | Status: AC
  Administered 2018-07-31: 500 mg via INTRAVENOUS
  Filled 2018-07-31: qty 50

## 2018-07-31 MED ORDER — FLEET ENEMA 7-19 GM/118ML RE ENEM: 1.0000 | ENEMA | Freq: Once | RECTAL | Status: DC | PRN

## 2018-07-31 MED ORDER — OXYCODONE HCL 5 MG PO TABS: 5.0000 mg | ORAL_TABLET | ORAL | Status: DC | PRN

## 2018-07-31 MED ORDER — PROPOFOL 10 MG/ML IV BOLUS
INTRAVENOUS | Status: DC | PRN
  Administered 2018-07-31: 20 mg via INTRAVENOUS

## 2018-07-31 MED ORDER — LATANOPROST 0.005 % OP SOLN
1.0000 [drp] | Freq: Every day | OPHTHALMIC | Status: DC
  Administered 2018-07-31 – 2018-08-01 (×2): 1 [drp] via OPHTHALMIC
  Filled 2018-07-31: qty 2.5

## 2018-07-31 MED ORDER — PANTOPRAZOLE SODIUM 40 MG PO TBEC
40.0000 mg | DELAYED_RELEASE_TABLET | Freq: Every day | ORAL | Status: DC
  Administered 2018-07-31 – 2018-08-02 (×3): 40 mg via ORAL
  Filled 2018-07-31 (×3): qty 1

## 2018-07-31 MED ORDER — CEFAZOLIN SODIUM-DEXTROSE 2-4 GM/100ML-% IV SOLN
2.0000 g | INTRAVENOUS | Status: AC
  Administered 2018-07-31: 2 g via INTRAVENOUS
  Filled 2018-07-31: qty 100

## 2018-07-31 MED ORDER — ACETAMINOPHEN 500 MG PO TABS
1000.0000 mg | ORAL_TABLET | Freq: Four times a day (QID) | ORAL | Status: AC
  Administered 2018-07-31 – 2018-08-01 (×2): 1000 mg via ORAL
  Filled 2018-07-31 (×3): qty 2

## 2018-07-31 MED ORDER — TRANEXAMIC ACID 1000 MG/10ML IV SOLN: 1000.0000 mg | INTRAVENOUS | Status: DC

## 2018-07-31 MED ORDER — ASPIRIN 81 MG PO CHEW
81.0000 mg | CHEWABLE_TABLET | Freq: Two times a day (BID) | ORAL | Status: DC
  Administered 2018-07-31 – 2018-08-02 (×4): 81 mg via ORAL
  Filled 2018-07-31 (×4): qty 1

## 2018-07-31 MED ORDER — POLYETHYLENE GLYCOL 3350 17 G PO PACK: 17.0000 g | PACK | Freq: Every day | ORAL | Status: DC | PRN

## 2018-07-31 MED ORDER — DEXAMETHASONE SODIUM PHOSPHATE 10 MG/ML IJ SOLN
10.0000 mg | Freq: Once | INTRAMUSCULAR | Status: AC
  Administered 2018-08-01: 10 mg via INTRAVENOUS
  Filled 2018-07-31: qty 1

## 2018-07-31 MED ORDER — PROPOFOL 10 MG/ML IV BOLUS
INTRAVENOUS | Status: AC
  Filled 2018-07-31: qty 60

## 2018-07-31 MED ORDER — SODIUM CHLORIDE 0.9 % IJ SOLN
INTRAMUSCULAR | Status: AC
  Filled 2018-07-31: qty 50

## 2018-07-31 MED ORDER — BISACODYL 5 MG PO TBEC: 5.0000 mg | DELAYED_RELEASE_TABLET | Freq: Every day | ORAL | Status: DC | PRN

## 2018-07-31 MED ORDER — BUPIVACAINE LIPOSOME 1.3 % IJ SUSP
INTRAMUSCULAR | Status: DC | PRN
  Administered 2018-07-31: 20 mL

## 2018-07-31 MED ORDER — METOCLOPRAMIDE HCL 5 MG/ML IJ SOLN: 5.0000 mg | Freq: Three times a day (TID) | INTRAMUSCULAR | Status: DC | PRN

## 2018-07-31 MED ORDER — GABAPENTIN 300 MG PO CAPS
300.0000 mg | ORAL_CAPSULE | Freq: Three times a day (TID) | ORAL | Status: DC
  Administered 2018-07-31 – 2018-08-02 (×5): 300 mg via ORAL
  Filled 2018-07-31 (×5): qty 1

## 2018-07-31 MED ORDER — FLUOXETINE HCL 10 MG PO CAPS
10.0000 mg | ORAL_CAPSULE | Freq: Every day | ORAL | Status: DC
  Administered 2018-08-01 – 2018-08-02 (×2): 10 mg via ORAL
  Filled 2018-07-31 (×2): qty 1

## 2018-07-31 MED ORDER — FAMOTIDINE 20 MG PO TABS
20.0000 mg | ORAL_TABLET | Freq: Two times a day (BID) | ORAL | Status: DC
  Administered 2018-07-31 – 2018-08-02 (×4): 20 mg via ORAL
  Filled 2018-07-31 (×4): qty 1

## 2018-07-31 SURGICAL SUPPLY — 45 items
ARTICULEZE HEAD (Hips) ×3 IMPLANT
BAG DECANTER FOR FLEXI CONT (MISCELLANEOUS) ×3 IMPLANT
BLADE SAW SGTL 18X1.27X75 (BLADE) ×2 IMPLANT
BLADE SAW SGTL 18X1.27X75MM (BLADE) ×1
COVER PERINEAL POST (MISCELLANEOUS) ×3 IMPLANT
COVER SURGICAL LIGHT HANDLE (MISCELLANEOUS) ×3 IMPLANT
DECANTER SPIKE VIAL GLASS SM (MISCELLANEOUS) ×3 IMPLANT
DRAPE STERI IOBAN 125X83 (DRAPES) ×3 IMPLANT
DRAPE U-SHAPE 47X51 STRL (DRAPES) ×6 IMPLANT
DRSG AQUACEL AG ADV 3.5X 4 (GAUZE/BANDAGES/DRESSINGS) ×6 IMPLANT
DRSG AQUACEL AG ADV 3.5X10 (GAUZE/BANDAGES/DRESSINGS) ×6 IMPLANT
DURAPREP 26ML APPLICATOR (WOUND CARE) ×3 IMPLANT
ELECT REM PT RETURN 15FT ADLT (MISCELLANEOUS) ×3 IMPLANT
ELIMINATOR HOLE APEX DEPUY (Hips) ×3 IMPLANT
GLOVE BIO SURGEON STRL SZ7.5 (GLOVE) ×3 IMPLANT
GLOVE BIO SURGEON STRL SZ8.5 (GLOVE) ×3 IMPLANT
GLOVE BIOGEL PI IND STRL 8 (GLOVE) ×1 IMPLANT
GLOVE BIOGEL PI IND STRL 9 (GLOVE) ×1 IMPLANT
GLOVE BIOGEL PI INDICATOR 8 (GLOVE) ×2
GLOVE BIOGEL PI INDICATOR 9 (GLOVE) ×2
GOWN STRL REUS W/ TWL LRG LVL3 (GOWN DISPOSABLE) ×1 IMPLANT
GOWN STRL REUS W/ TWL XL LVL3 (GOWN DISPOSABLE) ×1 IMPLANT
GOWN STRL REUS W/TWL LRG LVL3 (GOWN DISPOSABLE) ×2
GOWN STRL REUS W/TWL XL LVL3 (GOWN DISPOSABLE) ×2
HEAD ARTICULEZE (Hips) ×1 IMPLANT
MANIFOLD NEPTUNE II (INSTRUMENTS) ×3 IMPLANT
NEEDLE HYPO 22GX1.5 SAFETY (NEEDLE) ×6 IMPLANT
NS IRRIG 1000ML POUR BTL (IV SOLUTION) ×3 IMPLANT
PACK ANTERIOR HIP CUSTOM (KITS) ×3 IMPLANT
PIN SECT CUP 56MM (Hips) ×3 IMPLANT
PINNACLE ALTRX PLUS 4 N 36X56 (Hips) ×3 IMPLANT
STEM TRI LOC GRIPTION SZ 5 STD ×1 IMPLANT
SUT ETHIBOND NAB CT1 #1 30IN (SUTURE) ×3 IMPLANT
SUT VIC AB 0 CT1 27 (SUTURE)
SUT VIC AB 0 CT1 27XBRD ANBCTR (SUTURE) IMPLANT
SUT VIC AB 1 CTX 36 (SUTURE) ×2
SUT VIC AB 1 CTX36XBRD ANBCTR (SUTURE) ×1 IMPLANT
SUT VIC AB 2-0 CT1 27 (SUTURE) ×2
SUT VIC AB 2-0 CT1 TAPERPNT 27 (SUTURE) ×1 IMPLANT
SUT VIC AB 3-0 CT1 27 (SUTURE) ×2
SUT VIC AB 3-0 CT1 TAPERPNT 27 (SUTURE) ×1 IMPLANT
SYR CONTROL 10ML LL (SYRINGE) ×9 IMPLANT
TRAY FOLEY MTR SLVR 16FR STAT (SET/KITS/TRAYS/PACK) ×3 IMPLANT
TRI LOC GRIPTION SZ 5 STD ×3 IMPLANT
YANKAUER SUCT BULB TIP 10FT TU (MISCELLANEOUS) ×3 IMPLANT

## 2018-07-31 NOTE — Anesthesia Procedure Notes (Signed)
Procedure Name: MAC Date/Time: 07/31/2018 3:33 PM Performed by: Dione Booze, CRNA Pre-anesthesia Checklist: Patient identified, Emergency Drugs available, Suction available and Patient being monitored Patient Re-evaluated:Patient Re-evaluated prior to induction Oxygen Delivery Method: Simple face mask Placement Confirmation: positive ETCO2

## 2018-07-31 NOTE — Op Note (Signed)
OPERATIVE REPORT    DATE OF PROCEDURE:  07/31/2018       PREOPERATIVE DIAGNOSIS: Retained screws right hip, RIGHT HIP OSTEOARTHRITIS, AVASCULAR NECROSIS                                                          POSTOPERATIVE DIAGNOSIS: Retained screws right hip, RIGHT HIP OSTEOARTHRITIS, AVASCULAR NECROSIS                                                           PROCEDURE: Removal of cannulated screws x3, anterior right total hip arthroplasty using a 56 mm DePuy Pinnacle  Cup, [employer], 0-degree polyethylene liner, a + +5 x 36 mm ceramic head, a #5 standard offset Depuy Triloc stem   SURGEON: Nestor Lewandowsky    ASSISTANT:   Tomi Likens. [employer]  (present throughout entire procedure and necessary for timely completion of the procedure)   ANESTHESIA: Spinal BLOOD LOSS: 400 cc FLUID REPLACEMENT: 1600 cc crystalloid Antibiotic: 2 g Ancef Tranexamic Acid: 1gm IV, 2gm Topical Exparel: 266mg  COMPLICATIONS: none    INDICATIONS FOR PROCEDURE: A 84 y.o. year-old With  RIGHT HIP OSTEOARTHRITIS, AVASCULAR NECROSIS   for 5 years, x-rays show bone-on-bone arthritic changes, and osteophytes.  There are also retained cannulated screws from a prior hip pinning over 10 years ago at another facility despite conservative measures with observation, anti-inflammatory medicine, narcotics, use of a cane, has severe unremitting pain and can ambulate only a few blocks before resting. Patient desires elective removal of cannulated screws, right total hip arthroplasty to decrease pain and increase function. The risks, benefits, and alternatives were discussed at length including but not limited to the risks of infection, bleeding, nerve injury, stiffness, blood clots, the need for revision surgery, cardiopulmonary complications, among others, and they were willing to proceed. Questions answered      PROCEDURE IN DETAIL: The patient was identified by armband,   received preoperative IV antibiotics in the  holding area at [facility], taken to the operating room , appropriate anesthetic monitors   were attached and  anesthesia was induced with the patienton the gurney. The HANA boots were applied to the feet and he was then transferred to the HANA table with a peroneal post and support underneath the non-operative le, which was locked in 2 lb traction. Theoperative lower extremity was then prepped and draped in the usual sterile fashion from just above the iliac crest to the knee. And a timeout procedure was performed.  Under C-arm control we then re-created the stab wound laterally below the level of the flare of the greater trochanter with a 10 blade through the skin and subcutaneous tissue and then using a standard screwdriver extracted the 3 cannulated screws without difficulty.  We then made a 12 cm incision along the interval at the leading edge of the tensor fascia lata of starting at 2 cm lateral to and 2 cm distal to the ASIS. Small bleeders in the skin and subcutaneous tissue identified and cauterized we dissected down to the fascia and made an incision in the fascia allowing [place]  to elevate the fascia of the tensor muscle and exploited the interval between the rectus and the tensor fascia lata. A Cobra retractor was then placed along the superior neck of the femur and a Cobra retractor along the inferior neck of the femur we teed the capsule starting out at the superior anterior aspect of the acetabulum going distally and made the T along the neck both leaflets of the T were tagged with #2 Ethibond suture. Cobra retractors were then placed along the inferior and superior neck allowing [place] to perform a standard neck cut and removed the femoral head with a power corkscrew. We then placed a right angle Hohmann retractor along the anterior aspect of the acetabulum a long bent Homan in the cotyloid notch and posteriorly a Cobra retractor. We then sequentially reamed up to a 55 mm basket reamer obtaining  good coverage in all quadrants, verified by C-arm imaging. Under C-arm control with and hammered into place a 56 mm Pinnacle cup in 45 of abduction and 15 of anteversion. The cup seated nicely and required no supplemental screws. We then placed a central hole Eliminator and a 0 polyethylene liner. The foot was then externally rotated to 130, the limb was extended and adducted delivering the proximal femur up into the wound. A medium Hohmann retractor was placed over the greater trochanter and a long Homan retractor along the posterior femoral neck completing the exposure. We then performed releases superiorly and and inferiorly of the capsule going back to the pirformis fossa superiorly and to the lesser trochanter inferiorly. We then entered the proximal femur with the box cutting offset chisel followed by, a canal sounder, the chili pepper and broaching up to a 5 broach. This seated nicely and we reamed the calcar. A trial reduction was performed with a 5 mm 36 mm head.The limb lengths were excellent the hip was stable in 90 of external rotation. At this point the trial components removed and we hammered into place a #6 standard offset Tri-Lock stem with Gryption coating. A + +5 x 36 mm ceramic ball was then hammered into place the hip was reduced and final C-arm images obtained. The wound was thoroughly irrigated with normal saline solution. We repaired the ant capsule and the tensor fascia lot a with running 0 vicryl suture. the subcutaneous tissue was closed with 2-0 and 3-0 Vicryl suture followed by an Aquacil dressing. At this point the patient was awaken and transferred to hospital gurney without difficulty. The subcutaneous tissue with 0 and 2-0 undyed Vicryl suture and the skin with running   3-0 vicryl subcuticular suture. Aquacil dressing was applied. The patient was then unclamped, rolled supine, awaken extubated and taken to recovery room without difficulty in stable condition.   Nestor Lewandowsky 07/31/2018, 4:45 PM

## 2018-07-31 NOTE — Anesthesia Postprocedure Evaluation (Signed)
Anesthesia Post Note  Patient: Layli S Caldera  Procedure(s) Performed: RIGHT TOTAL HIP ARTHROPLASTY ANTERIOR APPROACH (Right Hip)     Patient location during evaluation: PACU Anesthesia Type: Spinal Level of consciousness: oriented and awake and alert Pain management: pain level controlled Vital Signs Assessment: post-procedure vital signs reviewed and stable Respiratory status: spontaneous breathing, respiratory function stable and patient connected to nasal cannula oxygen Cardiovascular status: blood pressure returned to baseline and stable Postop Assessment: no headache, no backache, no apparent nausea or vomiting, spinal receding and patient able to bend at knees Anesthetic complications: no    Last Vitals:  Vitals:   07/31/18 1800 07/31/18 1814  BP: 126/68 131/63  Pulse: 60 (!) 58  Resp: 16 12  Temp:    SpO2: 100% 100%    Last Pain:  Vitals:   07/31/18 1800  TempSrc:   PainSc: 0-No pain                 Shelton Silvas

## 2018-07-31 NOTE — Discharge Instructions (Signed)

## 2018-07-31 NOTE — Anesthesia Procedure Notes (Signed)
Spinal  Start time: 07/31/2018 3:31 PM End time: 07/31/2018 3:34 PM Staffing Anesthesiologist: Shelton Silvas, MD Performed: anesthesiologist  Preanesthetic Checklist Completed: patient identified, site marked, surgical consent, pre-op evaluation, timeout performed, IV checked, risks and benefits discussed and monitors and equipment checked Spinal Block Patient position: sitting Prep: site prepped and draped and DuraPrep Location: L3-4 Injection technique: single-shot Needle Needle type: Pencan  Needle gauge: 24 G Needle length: 10 cm Needle insertion depth: 10 cm Additional Notes Patient tolerated well. No immediate complications.

## 2018-07-31 NOTE — Telephone Encounter (Signed)
Confirmed remote transmission w/ pt daughter.   

## 2018-07-31 NOTE — Anesthesia Preprocedure Evaluation (Addendum)
Anesthesia Evaluation  Patient identified by MRN, date of birth, ID band Patient awake    Reviewed: Allergy & Precautions, NPO status , Patient's Chart, lab work & pertinent test results  History of Anesthesia Complications (+) PONV  Airway Mallampati: I  TM Distance: >3 FB Neck ROM: Full    Dental  (+) Teeth Intact, Dental Advisory Given   Pulmonary asthma , COPD,    breath sounds clear to auscultation       Cardiovascular hypertension, Pt. on medications + CAD  + dysrhythmias + pacemaker + Valvular Problems/Murmurs MVP and AS  Rhythm:Regular Rate:Normal  s/p AVR   Neuro/Psych  Headaches, Depression Dementia CVA, Residual Symptoms    GI/Hepatic Neg liver ROS, GERD  Medicated,  Endo/Other  Hypothyroidism   Renal/GU      Musculoskeletal  (+) Arthritis ,   Abdominal Normal abdominal exam  (+)   Peds  Hematology   Anesthesia Other Findings   Reproductive/Obstetrics                            Lab Results  Component Value Date   WBC 4.4 07/26/2018   HGB 13.5 07/26/2018   HCT 40.1 07/26/2018   MCV 91.6 07/26/2018   PLT 199 07/26/2018   Lab Results  Component Value Date   INR 0.92 07/26/2018   INR 1.00 10/29/2014   INR 1.4 04/29/2008   Echo: - Left ventricle: The cavity size was normal. Wall thickness was   normal. Systolic function was normal. The estimated ejection   fraction was in the range of 60% to 65%. Wall motion was normal;   there were no regional wall motion abnormalities. Doppler   parameters are consistent with pseudonormall left ventricular   relaxation (grade 2 diastolic dysfunction). The E/e&' ratio is   >20, suggesting markedly elevated LV filling pressure. - Aortic valve: Bioprosthetic AVR. No obstruction. Mean gradient   (S): 14 mm Hg. Peak gradient (S): 26 mm Hg. - Mitral valve: Mildly thickened leaflet, mild late systolic   prolapse of the AMVL. There was mild  regurgitation. - Left atrium: Moderately dilated. - Right ventricle: The cavity size was normal. Wall thickness was   normal. Pacer wire or catheter noted in right ventricle. - Right atrium: The atrium was normal in size. Pacer wire or   catheter noted in right atrium. - Atrial septum: Bows from left to right, suggesting a high LA   pressure. - Tricuspid valve: There was moderate regurgitation. - Pulmonary arteries: PA peak pressure: 31 mm Hg (S). - Inferior vena cava: The vessel was normal in size. The   respirophasic diameter changes were in the normal range (>= 50%),   consistent with normal central venous pressure.  Pacemaker Report: Notes recorded by [name], MD on 05/05/2018 at 8:49 AM EDT Remote reviewed.  Not pacemaker dependent. Battery status is good. Lead measurements are stable. Heart rate histogram is favorable. No clinically significant episodes of high ventricular rate. Occasional episodes of paroxysmal atrial tachycardia. Overall burden of months which is similar to previous downloads.  Anesthesia Physical Anesthesia Plan  ASA: III  Anesthesia Plan: Spinal   Post-op Pain Management:    Induction: Intravenous  PONV Risk Score and Plan: 4 or greater and Propofol infusion, Ondansetron and Treatment may vary due to age or medical condition  Airway Management Planned: Natural Airway and Simple Face Mask  Additional Equipment: None  Intra-op Plan:   Post-operative Plan:   Informed  Consent: I have reviewed the patients History and Physical, chart, labs and discussed the procedure including the risks, benefits and alternatives for the proposed anesthesia with the patient or authorized representative who has indicated his/her understanding and acceptance.     Plan Discussed with: CRNA  Anesthesia Plan Comments:        Anesthesia Quick Evaluation

## 2018-07-31 NOTE — Transfer of Care (Signed)
Immediate Anesthesia Transfer of Care Note  Patient: [name]  Procedure(s) Performed: RIGHT TOTAL HIP ARTHROPLASTY ANTERIOR APPROACH (Right Hip)  Patient Location: PACU  Anesthesia Type:MAC and Spinal  Level of Consciousness: awake and patient cooperative  Airway & Oxygen Therapy: Patient Spontanous Breathing and Patient connected to face mask oxygen  Post-op Assessment: Report given to RN and Post -op Vital signs reviewed and stable  Post vital signs: Reviewed and stable  Last Vitals:  Vitals Value Taken Time  BP 122/56 07/31/2018  5:27 PM  Temp 36.8 C 07/31/2018  5:27 PM  Pulse 60 07/31/2018  5:30 PM  Resp 14 07/31/2018  5:30 PM  SpO2 100 % 07/31/2018  5:30 PM  Vitals shown include unvalidated device data.  Last Pain:  Vitals:   07/31/18 1311  TempSrc:   PainSc: 3       Patients Stated Pain Goal: 4 (33/91/79 2178)  Complications: No apparent anesthesia complications

## 2018-08-01 ENCOUNTER — Other Ambulatory Visit: Payer: Self-pay | Admitting: Primary Care

## 2018-08-01 ENCOUNTER — Encounter (HOSPITAL_COMMUNITY): Payer: Self-pay | Admitting: Orthopedic Surgery

## 2018-08-01 LAB — BASIC METABOLIC PANEL
ANION GAP: 7 (ref 5–15)
BUN: 21 mg/dL (ref 8–23)
CO2: 25 mmol/L (ref 22–32)
Calcium: 8.2 mg/dL — ABNORMAL LOW (ref 8.9–10.3)
Chloride: 106 mmol/L (ref 98–111)
Creatinine, Ser: 1.22 mg/dL — ABNORMAL HIGH (ref 0.44–1.00)
GFR calc Af Amer: 47 mL/min — ABNORMAL LOW (ref >60)
GFR calc non Af Amer: 40 mL/min — ABNORMAL LOW (ref >60)
GLUCOSE: 338 mg/dL — AB (ref 70–99)
POTASSIUM: 3.6 mmol/L (ref 3.5–5.1)
Sodium: 138 mmol/L (ref 135–145)

## 2018-08-01 LAB — CBC
HEMATOCRIT: 28.8 % — AB (ref 36.0–46.0)
Hemoglobin: 9.7 g/dL — ABNORMAL LOW (ref 12.0–15.0)
MCH: 31.2 pg (ref 26.0–34.0)
MCHC: 33.7 g/dL (ref 30.0–36.0)
MCV: 92.6 fL (ref 80.0–100.0)
PLATELETS: 161 10*3/uL (ref 150–400)
RBC: 3.11 MIL/uL — AB (ref 3.87–5.11)
RDW: 13.3 % (ref 11.5–15.5)
WBC: 9.7 10*3/uL (ref 4.0–10.5)

## 2018-08-01 MED ORDER — SODIUM CHLORIDE 0.9 % IV BOLUS
300.0000 mL | Freq: Once | INTRAVENOUS | Status: AC
  Administered 2018-08-01: 300 mL via INTRAVENOUS

## 2018-08-01 NOTE — Evaluation (Addendum)
Physical Therapy Evaluation Patient Details Name: [name] MRN: [id number] DOB: 05/04/1934 Today's Date: 08/01/2018   History of Present Illness  Pt is an 84 year old female s/p R direct anterior THA with hx of CAD s/p CABG, hypertension, TIA, and Alzheimer's dementia  Clinical Impression  Pt admitted with above diagnosis. Pt currently with functional limitations due to the deficits listed below (see PT Problem List).  Pt will benefit from skilled PT to increase their independence and safety with mobility to allow discharge to the venue listed below.  Pt requiring min assist for mobility at this time as well as cues for safety.  Pt ambulated in hallway with her rollator from home (utilized since she is familiar with this).  Pt typically has caregivers or family for supervision at home.      Follow Up Recommendations Home health PT;Follow surgeon's recommendation for DC plan and follow-up therapies;Supervision/Assistance - 24 hour    Equipment Recommendations  None recommended by PT    Recommendations for Other Services       Precautions / Restrictions Precautions Precautions: None;Fall Restrictions Weight Bearing Restrictions: No Other Position/Activity Restrictions: WBAT      Mobility  Bed Mobility Overal bed mobility: Needs Assistance Bed Mobility: Supine to Sit     Supine to sit: Min assist     General bed mobility comments: assist for R LE  Transfers Overall transfer level: Needs assistance Equipment used: Rolling walker (2 wheeled) Transfers: Sit to/from Stand Sit to Stand: Min assist         General transfer comment: verbal cues for safe technique, assist to rise and steady  Ambulation/Gait Ambulation/Gait assistance: Min guard;Min assist Gait Distance (Feet): 220 Feet Assistive device: 4-wheeled walker Gait Pattern/deviations: Step-through pattern;Decreased stride length;Decreased weight shift to right;Decreased stance time - right     General  Gait Details: verbal cues for sequence, posture, recliner following for safety, pt denies any symptoms, no increase in pain reported  Stairs            Wheelchair Mobility    Modified Rankin (Stroke Patients Only)       Balance Overall balance assessment: History of Falls                                           Pertinent Vitals/Pain Pain Assessment: Faces Faces Pain Scale: Hurts a little bit Pain Location: right hip Pain Descriptors / Indicators: Aching;Sore Pain Intervention(s): Limited activity within patient's tolerance;Monitored during session;Repositioned;Ice applied    Home Living Family/patient expects to be discharged to:: Private residence Living Arrangements: Children Available Help at Discharge: Family;Available 24 hours/day;Personal care attendant Type of Home: House Home Access: Stairs to enter Entrance Stairs-Rails: Right Entrance Stairs-Number of Steps: 3 and 2 and 1 Home Layout: One level Home Equipment: Walker - 4 wheels      Prior Function Level of Independence: Independent with assistive device(s);Needs assistance   Gait / Transfers Assistance Needed: uses rollator, 24/7 supervision for cognition/safety           Hand Dominance        Extremity/Trunk Assessment        Lower Extremity Assessment Lower Extremity Assessment: RLE deficits/detail RLE Deficits / Details: anticipated post op hip weakness, fair quad contraction, able to perform ankle pumps       Communication   Communication: No difficulties  Cognition Arousal/Alertness: Awake/alert Behavior During  Therapy: WFL for tasks assessed/performed Overall Cognitive Status: History of cognitive impairments - at baseline                                 General Comments: hx Alzheimer's dementia      General Comments      Exercises Total Joint Exercises Ankle Circles/Pumps: AROM;10 reps;Both Quad Sets: AROM;10 reps;Both Short Arc Quad:  10 reps;AAROM;Right Heel Slides: AAROM;10 reps;Right Hip ABduction/ADduction: AAROM;10 reps;Right   Assessment/Plan    PT Assessment Patient needs continued PT services  PT Problem List Decreased strength;Decreased mobility;Decreased activity tolerance;Decreased knowledge of use of DME;Decreased balance;Pain       PT Treatment Interventions Stair training;Gait training;Therapeutic exercise;Patient/family education;Therapeutic activities;DME instruction;Functional mobility training;Balance training    PT Goals (Current goals can be found in the Care Plan section)  Acute Rehab PT Goals PT Goal Formulation: With patient/family Time For Goal Achievement: 08/05/18 Potential to Achieve Goals: Good    Frequency 7X/week   Barriers to discharge        Co-evaluation               AM-PAC PT "6 Clicks" Daily Activity  Outcome Measure Difficulty turning over in bed (including adjusting bedclothes, sheets and blankets)?: A Little Difficulty moving from lying on back to sitting on the side of the bed? : Unable Difficulty sitting down on and standing up from a chair with arms (e.g., wheelchair, bedside commode, etc,.)?: Unable Help needed moving to and from a bed to chair (including a wheelchair)?: A Little Help needed walking in hospital room?: A Little Help needed climbing 3-5 steps with a railing? : A Lot 6 Click Score: 13    End of Session Equipment Utilized During Treatment: Gait belt Activity Tolerance: Patient tolerated treatment well Patient left: in chair;with call bell/phone within reach;with chair alarm set;with family/visitor present Nurse Communication: Mobility status PT Visit Diagnosis: Other abnormalities of gait and mobility (R26.89)    Time: 5638-7564 PT Time Calculation (min) (ACUTE ONLY): 28 min   Charges:   PT Evaluation $PT Eval Low Complexity: 1 Low PT Treatments $Therapeutic Exercise: 8-22 mins        Zenovia Jarred, PT, DPT Acute Rehabilitation  Services Office: [phone] Pager: [phone]  Sarajane Jews 08/01/2018, 12:57 PM

## 2018-08-01 NOTE — Progress Notes (Signed)
PATIENT ID: [name]  MRN: [id number]  DOB/AGE:  12/27/1933 / 84 y.o.  1 Day Post-Op Procedure(s) (LRB): RIGHT TOTAL HIP ARTHROPLASTY ANTERIOR APPROACH (Right)    PROGRESS NOTE Subjective: Patient is alert, oriented, no Nausea, no Vomiting, yes passing gas, . Taking PO well. Denies SOB, Chest or Calf Pain. Using Incentive Spirometer, PAS in place. Nurse reported episode of near syncope this morning.  Lowest systolic was 98.  Administered 300 cc bolus of saline.  Patient denies any symptoms. Ambulate WBAT, Patient must be supervised at all times secondary totia Patient reports pain as  1/10    Objective: Vital signs in last 24 hours: Vitals:   07/31/18 2153 08/01/18 0134 08/01/18 0502 08/01/18 0510  BP: (Abnormal) 120/48 (Abnormal) 102/57 90/70 98/68   Pulse: 82 92 77   Resp:      Temp: 98.3 F (36.8 C) 98 F (36.7 C) 97.7 F (36.5 C)   TempSrc: Oral Oral Oral   SpO2: 99% 100% 100%   Weight:      Height:          Intake/Output from previous day: I/O last 3 completed shifts: In: 4099 [P.O.:174; I.V.:3025; Other:100; IV Piggyback:800] Out: 1200 [Urine:850; Blood:350]   Intake/Output this shift: No intake/output data recorded.   LABORATORY DATA: Recent Labs    07/31/18 1330 08/01/18 0456  WBC  --  9.7  HGB  --  9.7*  HCT  --  28.8*  PLT  --  161  NA 143 138  K 2.8* 3.6  CL 105 106  CO2 29 25  BUN 18 21  CREATININE 1.00 1.22*  GLUCOSE 108* 338*  CALCIUM 9.0 8.2*    Examination: Neurologically intact ABD soft Neurovascular intact Sensation intact distally Intact pulses distally Dorsiflexion/Plantar flexion intact Incision: dressing C/D/I No cellulitis present Compartment soft} XR AP&Lat of hip shows well placed\fixed THA  Assessment:   1 Day Post-Op Procedure(s) (LRB): RIGHT TOTAL HIP ARTHROPLASTY ANTERIOR APPROACH (Right) ADDITIONAL DIAGNOSIS:  Expected Acute Blood Loss Anemia, Dementia, Secondary gain a heart block, history of PAT, urinary  incontinence  Plan: PT/OT WBAT, THA  DVT Prophylaxis: SCDx72 hrs, ASA 81 mg BID x 2 weeks  DISCHARGE PLAN: Home  DISCHARGE NEEDS: HHPT, Walker and 3-in-1 comode seatPatient ID: [name], female   DOB: 1934-05-04, 84 y.o.   MRN: [id number]

## 2018-08-01 NOTE — Care Management Note (Signed)
Case Management Note  Patient Details  Name: [name] MRN: [id number] Date of Birth: June 15, 1934  Subjective/Objective:  Discharge planning, spoke with patient and daughter at bedside. Have chosen [facility] for [facility] PT, evaluate and treat.   Action/Plan: Contacted [facility] for referral. Has a rollator, will f/u with PT if any other needs. [phone]                  Expected Discharge Date:                  Expected Discharge Plan:  Home w Home Health Services  In-House Referral:  NA  Discharge planning Services  CM Consult  Post Acute Care Choice:  Home Health Choice offered to:  Patient, Adult Children  DME Arranged:  N/A DME Agency:  NA  HH Arranged:  PT HH Agency:  [facility] (formerly [facility]  Status of Service:  Completed, signed off  If discussed at [employer], dates discussed:    Additional Comments:  Alexis Goodell, RN 08/01/2018, 10:08 AM

## 2018-08-01 NOTE — Progress Notes (Signed)
Physical Therapy Treatment Patient Details Name: [name] MRN: [id number] DOB: 02-Apr-1934 Today's Date: 08/01/2018    History of Present Illness Pt is an 84 year old female s/p R direct anterior THA with hx of CAD s/p CABG, hypertension, TIA, and Alzheimer's dementia    PT Comments    Pt assisted with ambulating in hallway and requires cues for safety and decreasing speed.  Pt likely to d/c home tomorrow.  Pt will need to practice steps prior to d/c.   Follow Up Recommendations  Home health PT;Follow surgeon's recommendation for DC plan and follow-up therapies;Supervision/Assistance - 24 hour     Equipment Recommendations  None recommended by PT    Recommendations for Other Services       Precautions / Restrictions Precautions Precautions: None;Fall Restrictions Other Position/Activity Restrictions: WBAT    Mobility  Bed Mobility Overal bed mobility: Needs Assistance Bed Mobility: Sit to Supine     Supine to sit: Min assist Sit to supine: Min guard   General bed mobility comments: pt self assisted R LE  Transfers Overall transfer level: Needs assistance Equipment used: Rolling walker (2 wheeled) Transfers: Sit to/from Stand Sit to Stand: Min assist         General transfer comment: verbal cues for safe technique, assist to rise and steady, cues to wait for assistive device  Ambulation/Gait Ambulation/Gait assistance: Min guard;Min assist Gait Distance (Feet): 200 Feet Assistive device: 4-wheeled walker Gait Pattern/deviations: Step-through pattern;Decreased stride length;Decreased weight shift to right;Decreased stance time - right     General Gait Details: verbal cues for sequence, posture, cues to keep hands on rollator for safety (pt was talking with her hands)   Stairs             Wheelchair Mobility    Modified Rankin (Stroke Patients Only)       Balance Overall balance assessment: History of Falls                                           Cognition Arousal/Alertness: Awake/alert Behavior During Therapy: WFL for tasks assessed/performed;Impulsive Overall Cognitive Status: History of cognitive impairments - at baseline                                 General Comments: hx Alzheimer's dementia      Exercises     General Comments        Pertinent Vitals/Pain Pain Assessment: Faces Faces Pain Scale: Hurts a little bit Pain Location: right hip Pain Descriptors / Indicators: Aching;Sore Pain Intervention(s): Limited activity within patient's tolerance;Repositioned;Monitored during session    Home Living                      Prior Function            PT Goals (current goals can now be found in the care plan section) Acute Rehab PT Goals PT Goal Formulation: With patient/family Time For Goal Achievement: 08/05/18 Potential to Achieve Goals: Good Progress towards PT goals: Progressing toward goals    Frequency    7X/week      PT Plan Current plan remains appropriate    Co-evaluation              AM-PAC PT "6 Clicks" Daily Activity  Outcome Measure  Difficulty turning over  in bed (including adjusting bedclothes, sheets and blankets)?: A Little Difficulty moving from lying on back to sitting on the side of the bed? : A Lot Difficulty sitting down on and standing up from a chair with arms (e.g., wheelchair, bedside commode, etc,.)?: A Lot Help needed moving to and from a bed to chair (including a wheelchair)?: A Little Help needed walking in hospital room?: A Little Help needed climbing 3-5 steps with a railing? : A Lot 6 Click Score: 15    End of Session Equipment Utilized During Treatment: Gait belt Activity Tolerance: Patient tolerated treatment well Patient left: in bed;with call bell/phone within reach;with bed alarm set;with family/visitor present Nurse Communication: Mobility status PT Visit Diagnosis: Other abnormalities of gait and  mobility (R26.89)     Time: 1610-9604 PT Time Calculation (min) (ACUTE ONLY): 23 min  Charges:  $Gait Training: 8-22 mins                     Zenovia Jarred, PT, DPT Acute Rehabilitation Services Office: [phone] Pager: [phone]  [name] 08/01/2018, 4:11 PM

## 2018-08-01 NOTE — Progress Notes (Signed)
Remote pacemaker transmission.   

## 2018-08-02 ENCOUNTER — Inpatient Hospital Stay (HOSPITAL_COMMUNITY): Admission: RE | Admit: 2018-08-02 | Payer: Medicare Other | Source: Ambulatory Visit

## 2018-08-02 LAB — CBC
HCT: 24.3 % — ABNORMAL LOW (ref 36.0–46.0)
HEMOGLOBIN: 7.8 g/dL — AB (ref 12.0–15.0)
MCH: 31 pg (ref 26.0–34.0)
MCHC: 32.1 g/dL (ref 30.0–36.0)
MCV: 96.4 fL (ref 80.0–100.0)
Platelets: 140 10*3/uL — ABNORMAL LOW (ref 150–400)
RBC: 2.52 MIL/uL — ABNORMAL LOW (ref 3.87–5.11)
RDW: 13.7 % (ref 11.5–15.5)
WBC: 9.9 10*3/uL (ref 4.0–10.5)
nRBC: 0 % (ref 0.0–0.2)

## 2018-08-02 NOTE — Discharge Summary (Signed)
Patient ID: [name] MRN: [id number] DOB/AGE: 27-Sep-1934 84 y.o.  Admit date: 07/31/2018 Discharge date: 08/02/2018  Admission Diagnoses:  Principal Problem:   Avascular necrosis of bone of hip, right (HCC) Active Problems:   Primary osteoarthritis of right hip   Discharge Diagnoses:  Same  Past Medical History:  Diagnosis Date  . AAA (abdominal aortic aneurysm) (HCC) 01/03/13   3.5x3.3cm  . Alzheimer's dementia (HCC)    "probably middle stage" (11/05/2014)  . Anemia    "hx of chronic" (11/05/2014)  . Aortic stenosis 03/29/2008   biological prosthetic replacement  . Arthritis    "joints" (11/05/2014)  . Asthma   . Chronic asthmatic bronchitis (HCC)    "q fall and winter" (11/05/2014)  . Coronary artery disease    CABG 03/29/08  . Dyslipidemia   . Exertional shortness of breath   . Family history of adverse reaction to anesthesia    "daughter gets PONV too"  . Heart murmur   . Hypertension   . Hypothyroidism   . LBBB (left bundle branch block)   . Migraines    "none in years' (11/05/2014)  . Mobitz type 2 second degree AV block 10/31/2014  . MVP (mitral valve prolapse)    with mild mitral insufficiency/notes 08/17/2013  . PONV (postoperative nausea and vomiting)   . Presence of permanent cardiac pacemaker   . Sinus pause 10/31/2014  . Stroke [facility]) 03/2008   "2 light strokes"; denies residual on 11/05/2014  . Urinary frequency     Surgeries: Procedure(s): RIGHT TOTAL HIP ARTHROPLASTY ANTERIOR APPROACH on 07/31/2018   Consultants:   Discharged Condition: Improved  Hospital Course: BRETT SOZA is an 84 y.o. female who was admitted 07/31/2018 for operative treatment ofAvascular necrosis of bone of hip, right (HCC). Patient has severe unremitting pain that affects sleep, daily activities, and work/hobbies. After pre-op clearance the patient was taken to the operating room on 07/31/2018 and underwent  Procedure(s): RIGHT TOTAL HIP ARTHROPLASTY ANTERIOR APPROACH.     Patient was given perioperative antibiotics:  Anti-infectives (From admission, onward)   Start     Dose/Rate Route Frequency Ordered Stop   08/01/18 0600  ceFAZolin (ANCEF) IVPB 2g/100 mL premix     2 g 200 mL/hr over 30 Minutes Intravenous On call to O.R. 07/31/18 1235 07/31/18 1548       Patient was given sequential compression devices, early ambulation, and chemoprophylaxis to prevent DVT.  Patient benefited maximally from hospital stay and there were no complications.    Recent vital signs:  Patient Vitals for the past 24 hrs:  BP Temp Temp src Pulse Resp SpO2  08/02/18 0524 (!) 156/75 97.8 F (36.6 C) Oral 78 17 98 %  08/02/18 0518 (!) 127/47 98.6 F (37 C) Oral 71 17 98 %  08/01/18 2219 (!) 113/49 98.8 F (37.1 C) Oral 68 14 97 %  08/01/18 1958 - - - - - 97 %  08/01/18 1849 (!) 109/50 - - 72 16 97 %  08/01/18 1346 106/61 - - 74 16 99 %  08/01/18 1330 (!) 96/48 99.1 F (37.3 C) Oral 71 16 99 %  08/01/18 0948 (!) 116/53 98.7 F (37.1 C) Oral 67 14 99 %  08/01/18 0916 (!) 122/55 - - 72 14 100 %  08/01/18 0748 (!) 101/44 - - 63 14 97 %     Recent laboratory studies:  Recent Labs    07/31/18 1330 08/01/18 0456 08/02/18 0447  WBC  --  9.7 9.9  HGB  --  9.7* 7.8*  HCT  --  28.8* 24.3*  PLT  --  161 140*  NA 143 138  --   K 2.8* 3.6  --   CL 105 106  --   CO2 29 25  --   BUN 18 21  --   CREATININE 1.00 1.22*  --   GLUCOSE 108* 338*  --   CALCIUM 9.0 8.2*  --      Discharge Medications:   Allergies as of 08/02/2018      Reactions   Codeine Nausea And Vomiting, Other (See Comments)   Patient gets violently ill   Morphine And Related Nausea And Vomiting, Other (See Comments)   Patient get violently ill      Medication List    TAKE these medications   alendronate 70 MG tablet Commonly known as:  FOSAMAX TAKE 1 TAB BY MOUTH ONCE A WEEK. ON SUNDAYS, TAKE WITH A FULL GLASS OF WATER ON AN EMPTY STOMACH. What changed:  See the new instructions.    amLODipine 10 MG tablet Commonly known as:  NORVASC Take 1 tablet (10 mg total) by mouth daily.   amoxicillin 500 MG tablet Commonly known as:  AMOXIL Take 2,000 mg by mouth See admin instructions. Take 2,000mg  by mouth 1 hour prior to dental appointments   aspirin EC 81 MG tablet Take 1 tablet (81 mg total) by mouth 2 (two) times daily. What changed:    how much to take  when to take this   atorvastatin 40 MG tablet Commonly known as:  LIPITOR TAKE 1 TABLET BY MOUTH EVERY DAY IN THE EVENING FOR CHOLESTEROL What changed:    how much to take  how to take this  when to take this   brimonidine 0.2 % ophthalmic solution Commonly known as:  ALPHAGAN Place 1 drop into the left eye 2 (two) times daily.   cetirizine 10 MG tablet Commonly known as:  ZYRTEC Take 10 mg by mouth daily.   donepezil 10 MG tablet Commonly known as:  ARICEPT Take 1 tablet by mouth every night at bedtime for memory. What changed:    how much to take  how to take this  when to take this  additional instructions   FLUoxetine 10 MG capsule Commonly known as:  PROZAC TAKE 1 CAPSULE (10 MG TOTAL) BY MOUTH DAILY. FOR ANXIETY AND DEPRESSION   furosemide 40 MG tablet Commonly known as:  LASIX Take 1 tablet (40 mg total) by mouth daily.   levothyroxine 50 MCG tablet Commonly known as:  SYNTHROID, LEVOTHROID TAKE 1 TABLET BY MOUTH ON AN EMPTY STOMACH WITH A FULL GLASS OF WATER. What changed:  See the new instructions.   losartan 100 MG tablet Commonly known as:  COZAAR TAKE 1 TABLET BY MOUTH EVERY DAY IN THE MORNING What changed:  See the new instructions.   LUMIGAN 0.01 % Soln Generic drug:  bimatoprost Place 1 drop into the left eye at bedtime.   oxybutynin 5 MG 24 hr tablet Commonly known as:  DITROPAN-XL Take 1 tablet (5 mg total) by mouth at bedtime. For urinary incontinence.   oxyCODONE-acetaminophen 5-325 MG tablet Commonly known as:  PERCOCET/ROXICET Take 1 tablet by mouth  every 4 (four) hours as needed for severe pain.   tiZANidine 2 MG tablet Commonly known as:  ZANAFLEX Take 1 tablet (2 mg total) by mouth every 6 (six) hours as needed.   WIXELA INHUB 250-50 MCG/DOSE Aepb Generic drug:  Fluticasone-Salmeterol INHALE 1 PUFF INTO  THE LUNGS 2 (TWO) TIMES DAILY. FOR COPD What changed:  See the new instructions.   ZANTAC 150 MAXIMUM STRENGTH 150 MG tablet Generic drug:  ranitidine Take 150 mg by mouth 2 (two) times daily.            Durable Medical Equipment  (From admission, onward)         Start     Ordered   07/31/18 1835  DME Walker rolling  Once    Question:  Patient needs a walker to treat with the following condition  Answer:  Status post right hip replacement   07/31/18 1834   07/31/18 1835  DME 3 n 1  Once     07/31/18 1834           Discharge Care Instructions  (From admission, onward)         Start     Ordered   08/02/18 0000  Weight bearing as tolerated     08/02/18 0739          Diagnostic Studies: Dg Chest 2 View  Result Date: 07/26/2018 CLINICAL DATA:  Preop hip replacement. EXAM: CHEST - 2 VIEW COMPARISON:  12/16/2016 FINDINGS: Sequelae of prior CABG and aortic valve replacement are again identified. Pacemaker leads terminate over the right atrium and right ventricle, unchanged. The cardiac silhouette is borderline enlarged, unchanged. The lungs are mildly hyperinflated with chronic blunting of the costophrenic angles which may in part reflect pleural thickening. No sizable pleural effusion, airspace consolidation, edema, or pneumothorax is identified. No acute osseous abnormality is seen. IMPRESSION: No active cardiopulmonary disease. Electronically Signed   By: Sebastian Ache M.D.   On: 07/26/2018 11:01   Dg C-arm 1-60 Min-no Report  Result Date: 07/31/2018 CLINICAL DATA:  Right total hip arthroplasty EXAM: OPERATIVE RIGHT HIP (WITH PELVIS IF PERFORMED) 2 VIEWS TECHNIQUE: Fluoroscopic spot image(s) were submitted for  interpretation post-operatively. COMPARISON:  05/17/2018 FINDINGS: Fine bony detail is limited by the C-arm fluoroscopic technique. A total of 15 seconds of fluoroscopic time was utilized demonstrating placement of a right total uncemented hip arthroplasty without immediate postoperative complications. IMPRESSION: Fluoroscopic time utilized without immediate postoperative complications noted after right total uncemented hip arthroplasty. Electronically Signed   By: Tollie Eth M.D.   On: 07/31/2018 17:08   Dg Hip Operative Unilat W Or W/o Pelvis Right  Result Date: 07/31/2018 CLINICAL DATA:  Right total hip arthroplasty EXAM: OPERATIVE RIGHT HIP (WITH PELVIS IF PERFORMED) 2 VIEWS TECHNIQUE: Fluoroscopic spot image(s) were submitted for interpretation post-operatively. COMPARISON:  05/17/2018 FINDINGS: Fine bony detail is limited by the C-arm fluoroscopic technique. A total of 15 seconds of fluoroscopic time was utilized demonstrating placement of a right total uncemented hip arthroplasty without immediate postoperative complications. IMPRESSION: Fluoroscopic time utilized without immediate postoperative complications noted after right total uncemented hip arthroplasty. Electronically Signed   By: Tollie Eth M.D.   On: 07/31/2018 17:08    Disposition: Discharge disposition: 01-Home or Self Care       Discharge Instructions    Call MD / Call 911   Complete by:  As directed    If you experience chest pain or shortness of breath, CALL 911 and be transported to the hospital emergency room.  If you develope a fever above 101 F, pus (white drainage) or increased drainage or redness at the wound, or calf pain, call your surgeon's office.   Constipation Prevention   Complete by:  As directed    Drink plenty of fluids.  Prune  juice may be helpful.  You may use a stool softener, such as Colace (over the counter) 100 mg twice a day.  Use MiraLax (over the counter) for constipation as needed.   Diet - low  sodium heart healthy   Complete by:  As directed    Driving restrictions   Complete by:  As directed    No driving for 2 weeks   Increase activity slowly as tolerated   Complete by:  As directed    Patient may shower   Complete by:  As directed    You may shower without a dressing once there is no drainage.  Do not wash over the wound.  If drainage remains, cover wound with plastic wrap and then shower.   Weight bearing as tolerated   Complete by:  As directed       Follow-up Information    Gean Birchwood, MD In 2 weeks.   Specialty:  Orthopedic Surgery Contact information: [address] [place] [address] [phone]        [facility] Follow up.   Specialty:  Home Health Services Why:  physical therapy Contact information: [address] [place] [address] [phone]            Signed: Dannielle Burn 08/02/2018, 7:40 AM

## 2018-08-02 NOTE — Progress Notes (Signed)
PATIENT ID: [name]  MRN: [id number]  DOB/AGE:  82-07-1934 / 84 y.o.  2 Days Post-Op Procedure(s) (LRB): RIGHT TOTAL HIP ARTHROPLASTY ANTERIOR APPROACH (Right)    PROGRESS NOTE Subjective: Patient is alert, oriented, no Nausea, no Vomiting, yes passing gas, . Taking PO well. Denies SOB, Chest or Calf Pain. Using Incentive Spirometer, PAS in place. Ambulate WBAT with pt walking 220 ft with therapy.  She will practice steps today if able. Patient reports pain as  mild .    Objective: Vital signs in last 24 hours: Vitals:   08/01/18 1958 08/01/18 2219 08/02/18 0518 08/02/18 0524  BP:  (!) 113/49 (!) 127/47 (!) 156/75  Pulse:  68 71 78  Resp:  14 17 17   Temp:  98.8 F (37.1 C) 98.6 F (37 C) 97.8 F (36.6 C)  TempSrc:  Oral Oral Oral  SpO2: 97% 97% 98% 98%  Weight:      Height:          Intake/Output from previous day: I/O last 3 completed shifts: In: 4174 [P.O.:1074; I.V.:2500; IV Piggyback:600] Out: 1250 [Urine:1250]   Intake/Output this shift: No intake/output data recorded.   LABORATORY DATA: Recent Labs    07/31/18 1330 08/01/18 0456 08/02/18 0447  WBC  --  9.7 9.9  HGB  --  9.7* 7.8*  HCT  --  28.8* 24.3*  PLT  --  161 140*  NA 143 138  --   K 2.8* 3.6  --   CL 105 106  --   CO2 29 25  --   BUN 18 21  --   CREATININE 1.00 1.22*  --   GLUCOSE 108* 338*  --   CALCIUM 9.0 8.2*  --     Examination: Neurologically intact Neurovascular intact Sensation intact distally Intact pulses distally Dorsiflexion/Plantar flexion intact Incision: moderate drainage No cellulitis present Compartment soft} XR AP&Lat of hip shows well placed\fixed THA  Assessment:   2 Days Post-Op Procedure(s) (LRB): RIGHT TOTAL HIP ARTHROPLASTY ANTERIOR APPROACH (Right) ADDITIONAL DIAGNOSIS:  Expected Acute Blood Loss Anemia, Dementia, Secondary gain a heart block, history of PAT, urinary incontinence  Plan: PT/OT WBAT, THA  DVT Prophylaxis: SCDx72 hrs, ASA 81 mg BID x  2 weeks  DISCHARGE PLAN: Home, when pt passes therapy  DISCHARGE NEEDS: HHPT, Walker and 3-in-1 comode seat

## 2018-08-02 NOTE — Progress Notes (Signed)
Physical Therapy Treatment Patient Details Name: [name] MRN: [id number] DOB: [birth year] Today's Date: 08/02/2018    History of Present Illness Pt is an 84 year old female s/p R direct anterior THA with hx of CAD s/p CABG, hypertension, TIA, and Alzheimer's dementia    PT Comments    Pt ambulated in hallway and performed safe stair technique with daughter present and observing.  Pt also performed LE exercises.  Will return this afternoon to continue to emphasize safety and slowing down with pt prior to d/c.   Follow Up Recommendations  Home health PT;Follow surgeon's recommendation for DC plan and follow-up therapies;Supervision/Assistance - 24 hour     Equipment Recommendations  None recommended by PT    Recommendations for Other Services       Precautions / Restrictions Precautions Precautions: None;Fall Restrictions Other Position/Activity Restrictions: WBAT    Mobility  Bed Mobility Overal bed mobility: Needs Assistance Bed Mobility: Supine to Sit     Supine to sit: Supervision     General bed mobility comments: pt self assisted R LE  Transfers Overall transfer level: Needs assistance Equipment used: Rolling walker (2 wheeled) Transfers: Sit to/from Stand Sit to Stand: Min guard         General transfer comment: verbal cues for safe technique, cues to wait for assistive device  Ambulation/Gait Ambulation/Gait assistance: Min guard Gait Distance (Feet): 180 Feet Assistive device: 4-wheeled walker Gait Pattern/deviations: Step-through pattern;Decreased stride length;Decreased weight shift to right;Decreased stance time - right     General Gait Details: verbal cues for sequence, posture, cues to keep hands on rollator for safety   Stairs Stairs: Yes Stairs assistance: Min guard Stair Management: Step to pattern;Alternating pattern;Forwards;One rail Left;One rail Right Number of Stairs: 3 General stair comments: pt requires cues to slow down  and perform step to pattern for safety and healing; multiple safety cues required however pt only min/guard assist; performed x2   Wheelchair Mobility    Modified Rankin (Stroke Patients Only)       Balance                                            Cognition Arousal/Alertness: Awake/alert Behavior During Therapy: WFL for tasks assessed/performed Overall Cognitive Status: History of cognitive impairments - at baseline                                 General Comments: hx Alzheimer's dementia      Exercises Total Joint Exercises Ankle Circles/Pumps: AROM;10 reps;Both Quad Sets: AROM;10 reps;Both Short Arc Quad: 10 reps;Right;AROM Heel Slides: AAROM;10 reps;Right Hip ABduction/ADduction: AAROM;10 reps;Right Long Arc Quad: AROM;10 reps;Right;Seated    General Comments        Pertinent Vitals/Pain Pain Assessment: Faces Faces Pain Scale: Hurts a little bit Pain Location: right hip Pain Descriptors / Indicators: Aching;Sore Pain Intervention(s): Limited activity within patient's tolerance;Repositioned;Monitored during session    Home Living                      Prior Function            PT Goals (current goals can now be found in the care plan section) Progress towards PT goals: Progressing toward goals    Frequency    7X/week      PT  Plan Current plan remains appropriate    Co-evaluation              AM-PAC PT "6 Clicks" Daily Activity  Outcome Measure  Difficulty turning over in bed (including adjusting bedclothes, sheets and blankets)?: A Little Difficulty moving from lying on back to sitting on the side of the bed? : A Little Difficulty sitting down on and standing up from a chair with arms (e.g., wheelchair, bedside commode, etc,.)?: A Little Help needed moving to and from a bed to chair (including a wheelchair)?: A Little Help needed walking in hospital room?: A Little Help needed climbing 3-5 steps  with a railing? : A Little 6 Click Score: 18    End of Session Equipment Utilized During Treatment: Gait belt Activity Tolerance: Patient tolerated treatment well Patient left: with call bell/phone within reach;with family/visitor present;in chair;with chair alarm set Nurse Communication: Mobility status PT Visit Diagnosis: Other abnormalities of gait and mobility (R26.89)     Time: 4403-4742 PT Time Calculation (min) (ACUTE ONLY): 26 min  Charges:  $Gait Training: 8-22 mins $Therapeutic Exercise: 8-22 mins                    Zenovia Jarred, PT, DPT Acute Rehabilitation Services Office: [phone] Pager: [phone]   Sarajane Jews 08/02/2018, 3:24 PM

## 2018-08-02 NOTE — Progress Notes (Signed)
Physical Therapy Treatment Patient Details Name: [name] MRN: [id number] DOB: 09-Apr-1934 Today's Date: 08/02/2018    History of Present Illness Pt is an 84 year old female s/p R direct anterior THA with hx of CAD s/p CABG, hypertension, TIA, and Alzheimer's dementia    PT Comments    Emphasized safety and slowing down with pt as well as listening to caregivers upon d/c.  Pt ambulated and performed stairs again and requiring less safety cues this afternoon however still required cues.  Reinforced with pt that she will need assist for mobility at home upon d/c at this time for safety (due to dementia, forgetful).  Daughter educated on HEP handout and had questions about LE positioning, educated no extreme positions (pt wanted to perform standing hamstring stretch with hands to floor as well as sometimes sits with LEs crossed and knees near chest so recommended not performing these at this time (prevent dislocation) and ask at f/u visit with MD for further timeline.  Pt to d/c home today.   Follow Up Recommendations  Home health PT;Follow surgeon's recommendation for DC plan and follow-up therapies;Supervision/Assistance - 24 hour     Equipment Recommendations  None recommended by PT    Recommendations for Other Services       Precautions / Restrictions Precautions Precautions: None;Fall Restrictions Other Position/Activity Restrictions: WBAT    Mobility  Bed Mobility Overal bed mobility: Needs Assistance Bed Mobility: Supine to Sit     Supine to sit: Supervision     General bed mobility comments: pt self assisted R LE  Transfers Overall transfer level: Needs assistance Equipment used: Rolling walker (2 wheeled) Transfers: Sit to/from Stand Sit to Stand: Min guard         General transfer comment: verbal cues for safe technique, cues to wait for assistive device  Ambulation/Gait Ambulation/Gait assistance: Min guard Gait Distance (Feet): 240 Feet Assistive  device: 4-wheeled walker Gait Pattern/deviations: Step-through pattern;Decreased stride length;Decreased weight shift to right;Decreased stance time - right     General Gait Details: verbal cues for sequence, posture, cues to keep hands on rollator for safety   Stairs Stairs: Yes Stairs assistance: Min guard Stair Management: Step to pattern;Alternating pattern;Forwards;One rail Right Number of Stairs: 3 General stair comments: pt requires cues to slow down and perform step to pattern for safety and healing; moderate safety cues required however pt only min/guard assist; performed x2 again   Wheelchair Mobility    Modified Rankin (Stroke Patients Only)       Balance                                            Cognition Arousal/Alertness: Awake/alert Behavior During Therapy: WFL for tasks assessed/performed Overall Cognitive Status: History of cognitive impairments - at baseline                                 General Comments: hx Alzheimer's dementia      Exercises   General Comments        Pertinent Vitals/Pain Pain Assessment: Faces Faces Pain Scale: Hurts a little bit Pain Location: right hip Pain Descriptors / Indicators: Aching;Sore Pain Intervention(s): Repositioned;Monitored during session;Limited activity within patient's tolerance    Home Living  Prior Function            PT Goals (current goals can now be found in the care plan section) Progress towards PT goals: Progressing toward goals    Frequency    7X/week      PT Plan Current plan remains appropriate    Co-evaluation              AM-PAC PT "6 Clicks" Daily Activity  Outcome Measure  Difficulty turning over in bed (including adjusting bedclothes, sheets and blankets)?: A Little Difficulty moving from lying on back to sitting on the side of the bed? : A Little Difficulty sitting down on and standing up from a chair  with arms (e.g., wheelchair, bedside commode, etc,.)?: A Little Help needed moving to and from a bed to chair (including a wheelchair)?: A Little Help needed walking in hospital room?: A Little Help needed climbing 3-5 steps with a railing? : A Little 6 Click Score: 18    End of Session Equipment Utilized During Treatment: Gait belt Activity Tolerance: Patient tolerated treatment well Patient left: with call bell/phone within reach;with family/visitor present;in chair;with chair alarm set Nurse Communication: Mobility status PT Visit Diagnosis: Other abnormalities of gait and mobility (R26.89)     Time: 5852-7782 PT Time Calculation (min) (ACUTE ONLY): 24 min  Charges:  $Gait Training: 8-22 mins  $Self Care/Home Management: 8-22                    Zenovia Jarred, PT, DPT Acute Rehabilitation Services Office: [phone] Pager: [phone]  Sarajane Jews 08/02/2018, 3:28 PM

## 2018-08-08 ENCOUNTER — Encounter: Payer: Self-pay | Admitting: Neurology

## 2018-08-08 ENCOUNTER — Other Ambulatory Visit: Payer: Self-pay

## 2018-08-08 ENCOUNTER — Ambulatory Visit: Payer: Medicare Other | Admitting: Neurology

## 2018-08-08 VITALS — BP 116/58 | HR 89 | Ht 69.0 in | Wt 159.0 lb

## 2018-08-08 DIAGNOSIS — G301 Alzheimer's disease with late onset: Secondary | ICD-10-CM | POA: Diagnosis not present

## 2018-08-08 DIAGNOSIS — F0280 Dementia in other diseases classified elsewhere without behavioral disturbance: Secondary | ICD-10-CM

## 2018-08-08 MED ORDER — MEMANTINE HCL-DONEPEZIL HCL 7 & 14 & 21 &28 -10 MG PO C4PK: 1.0000 | EXTENDED_RELEASE_CAPSULE | Freq: Every day | ORAL | 0 refills | Status: DC

## 2018-08-08 MED ORDER — MEMANTINE HCL-DONEPEZIL HCL 28-10 MG PO CP24
1.0000 | ORAL_CAPSULE | Freq: Every day | ORAL | 3 refills | Status: DC
Start: 2018-08-08 — End: 2018-11-15
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
## 2018-08-08 NOTE — Patient Instructions (Addendum)
1. Switch to Namzaric daily 2. Continue 24/7 care 3. Follow-up in 6 months, call for any changes  FALL PRECAUTIONS: Be cautious when walking. Scan the area for obstacles that may increase the risk of trips and falls. When getting up in the mornings, sit up at the edge of the bed for a few minutes before getting out of bed. Consider elevating the bed at the head end to avoid drop of blood pressure when getting up. Walk always in a well-lit room (use night lights in the walls). Avoid area rugs or power cords from appliances in the middle of the walkways. Use a walker or a cane if necessary and consider physical therapy for balance exercise. Get your eyesight checked regularly.  FINANCIAL OVERSIGHT: Supervision, especially oversight when making financial decisions or transactions is also recommended.  HOME SAFETY: Consider the safety of the kitchen when operating appliances like stoves, microwave oven, and blender. Consider having supervision and share cooking responsibilities until no longer able to participate in those. Accidents with firearms and other hazards in the house should be identified and addressed as well.  DRIVING: Regarding driving, in patients with progressive memory problems, driving will be impaired. We advise to have someone else do the driving if trouble finding directions or if minor accidents are reported. Independent driving assessment is available to determine safety of driving.  ABILITY TO BE LEFT ALONE: If patient is unable to contact 911 operator, consider using LifeLine, or when the need is there, arrange for someone to stay with patients. Smoking is a fire hazard, consider supervision or cessation. Risk of wandering should be assessed by caregiver and if detected at any point, supervision and safe proof recommendations should be instituted.  MEDICATION SUPERVISION: Inability to self-administer medication needs to be constantly addressed. Implement a mechanism to ensure safe  administration of the medications.  RECOMMENDATIONS FOR ALL PATIENTS WITH MEMORY PROBLEMS: 1. Continue to exercise (Recommend 30 minutes of walking everyday, or 3 hours every week) 2. Increase social interactions - continue going to [place] and enjoy social gatherings with friends and family 3. Eat healthy, avoid fried foods and eat more fruits and vegetables 4. Maintain adequate blood pressure, blood sugar, and blood cholesterol level. Reducing the risk of stroke and cardiovascular disease also helps promoting better memory. 5. Avoid stressful situations. Live a simple life and avoid aggravations. Organize your time and prepare for the next day in anticipation. 6. Sleep well, avoid any interruptions of sleep and avoid any distractions in the bedroom that may interfere with adequate sleep quality 7. Avoid sugar, avoid sweets as there is a strong link between excessive sugar intake, diabetes, and cognitive impairment The Mediterranean diet has been shown to help patients reduce the risk of progressive memory disorders and reduces cardiovascular risk. This includes eating fish, eat fruits and green leafy vegetables, nuts like almonds and hazelnuts, walnuts, and also use olive oil. Avoid fast foods and fried foods as much as possible. Avoid sweets and sugar as sugar use has been linked to worsening of memory function.  There is always a concern of gradual progression of memory problems. If this is the case, then we may need to adjust level of care according to patient needs. Support, both to the patient and caregiver, should then be put into place.

## 2018-08-23 ENCOUNTER — Other Ambulatory Visit: Payer: Self-pay | Admitting: Primary Care

## 2018-08-23 DIAGNOSIS — R32 Unspecified urinary incontinence: Secondary | ICD-10-CM

## 2018-08-23 NOTE — Telephone Encounter (Signed)
Last prescribed on 06/03/2017 Last office visit on 06/20/2018

## 2018-08-23 NOTE — Telephone Encounter (Signed)
Noted, refills sent to pharmacy. 

## 2018-08-24 LAB — CUP PACEART REMOTE DEVICE CHECK
Battery Remaining Longevity: 153 mo
Battery Voltage: 2.79 V
Brady Statistic AP VS Percent: 23 %
Brady Statistic AS VS Percent: 76 %
Implantable Lead Implant Date: 20160112
Implantable Lead Location: 753860
Implantable Lead Model: 5076
Implantable Pulse Generator Implant Date: 20160112
Lead Channel Impedance Value: 527 Ohm
Lead Channel Pacing Threshold Amplitude: 0.75 V
Lead Channel Pacing Threshold Pulse Width: 0.4 ms
Lead Channel Pacing Threshold Pulse Width: 0.4 ms
Lead Channel Setting Pacing Pulse Width: 0.4 ms
MDC IDC LEAD IMPLANT DT: 20160112
MDC IDC LEAD LOCATION: 753859
MDC IDC MSMT BATTERY IMPEDANCE: 180 Ohm
MDC IDC MSMT LEADCHNL RA IMPEDANCE VALUE: 479 Ohm
MDC IDC MSMT LEADCHNL RV PACING THRESHOLD AMPLITUDE: 0.875 V
MDC IDC SESS DTM: 20191007144717
MDC IDC SET LEADCHNL RA PACING AMPLITUDE: 1.5 V
MDC IDC SET LEADCHNL RV PACING AMPLITUDE: 2 V
MDC IDC SET LEADCHNL RV SENSING SENSITIVITY: 2 mV
MDC IDC STAT BRADY AP VP PERCENT: 0 %
MDC IDC STAT BRADY AS VP PERCENT: 0 %

## 2018-08-29 ENCOUNTER — Other Ambulatory Visit: Payer: Self-pay | Admitting: Primary Care

## 2018-08-29 DIAGNOSIS — E039 Hypothyroidism, unspecified: Secondary | ICD-10-CM

## 2018-09-02 ENCOUNTER — Other Ambulatory Visit: Payer: Self-pay | Admitting: Primary Care

## 2018-09-02 DIAGNOSIS — F0390 Unspecified dementia without behavioral disturbance: Secondary | ICD-10-CM

## 2018-09-07 DIAGNOSIS — N289 Disorder of kidney and ureter, unspecified: Secondary | ICD-10-CM

## 2018-09-13 ENCOUNTER — Encounter: Payer: Self-pay | Admitting: Primary Care

## 2018-09-13 ENCOUNTER — Ambulatory Visit (INDEPENDENT_AMBULATORY_CARE_PROVIDER_SITE_OTHER): Payer: Medicare Other | Admitting: Primary Care

## 2018-09-13 VITALS — BP 114/60 | HR 65 | Temp 98.2°F | Ht 69.0 in | Wt 156.2 lb

## 2018-09-13 DIAGNOSIS — J22 Unspecified acute lower respiratory infection: Secondary | ICD-10-CM

## 2018-09-13 DIAGNOSIS — N289 Disorder of kidney and ureter, unspecified: Secondary | ICD-10-CM

## 2018-09-13 LAB — BASIC METABOLIC PANEL
BUN: 19 mg/dL (ref 6–23)
CHLORIDE: 103 meq/L (ref 96–112)
CO2: 33 mEq/L — ABNORMAL HIGH (ref 19–32)
Calcium: 8.8 mg/dL (ref 8.4–10.5)
Creatinine, Ser: 1.08 mg/dL (ref 0.40–1.20)
GFR: 51.62 mL/min — ABNORMAL LOW (ref >60.00)
Glucose, Bld: 106 mg/dL — ABNORMAL HIGH (ref 70–99)
POTASSIUM: 3.5 meq/L (ref 3.5–5.1)
SODIUM: 144 meq/L (ref 135–145)

## 2018-09-13 MED ORDER — AMOXICILLIN-POT CLAVULANATE 875-125 MG PO TABS: 1.0000 | ORAL_TABLET | Freq: Two times a day (BID) | ORAL | 0 refills | Status: DC

## 2018-09-13 NOTE — Addendum Note (Signed)
Addended by: Wendi Maya on: 09/13/2018 09:13 AM   Modules accepted: Orders

## 2018-09-13 NOTE — Patient Instructions (Signed)
Start Augmentin antibiotics for the infection Take 1 tablet by mouth twice daily for 7 days.  Continue Delsym as needed for cough.  Continue your inhaler twice daily.  It was a pleasure to see you today!

## 2018-09-13 NOTE — Progress Notes (Addendum)
Subjective:    Patient ID: [name], female    DOB: [birth year], 84 y.o.   MRN: [id number]  HPI  [name] is an 84 year old female with a history of hypertension, COPD, dementia, chronic cough who presents today with a chief complaint of cough. Her daughter is with her today who is providing most of the information for her HPI. Patient is able to answer some questions.   She also reports chest congestion. Her cough is mostly dry/hacky. She's been taking Delsym Day and Night with temporary relief. Her symptoms began 2 weeks ago. Her daughter denies fevers.   She is currently managed on fluticasone-salmeterol inhaler twice daily and Zyrtec at bedtime. She is s/p right hip replacement in early October 2019, feeling much better overall.   Review of Systems  Constitutional: Positive for fatigue. Negative for chills and fever.  HENT: Positive for congestion. Negative for sinus pain and sore throat.   Respiratory: Positive for cough, shortness of breath and wheezing.   Cardiovascular: Negative for chest pain.       Past Medical History:  Diagnosis Date  . AAA (abdominal aortic aneurysm) (HCC) 01/03/13   3.5x3.3cm  . Alzheimer's dementia (HCC)    "probably middle stage" (11/05/2014)  . Anemia    "hx of chronic" (11/05/2014)  . Aortic stenosis 03/29/2008   biological prosthetic replacement  . Arthritis    "joints" (11/05/2014)  . Asthma   . Chronic asthmatic bronchitis (HCC)    "q fall and winter" (11/05/2014)  . Coronary artery disease    CABG 03/29/08  . Dyslipidemia   . Exertional shortness of breath   . Family history of adverse reaction to anesthesia    "daughter gets PONV too"  . Heart murmur   . Hypertension   . Hypothyroidism   . LBBB (left bundle branch block)   . Migraines    "none in years' (11/05/2014)  . Mobitz type 2 second degree AV block 10/31/2014  . MVP (mitral valve prolapse)    with mild mitral insufficiency/notes 08/17/2013  . PONV (postoperative nausea  and vomiting)   . Presence of permanent cardiac pacemaker   . Sinus pause 10/31/2014  . Stroke [facility]) 03/2008   "2 light strokes"; denies residual on 11/05/2014  . Urinary frequency      Social History   Socioeconomic History  . Marital status: Divorced    Spouse name: Not on file  . Number of children: 1  . Years of education: Some college  . Highest education level: Not on file  Occupational History  . Occupation: Retired  Engineer, production  . Financial resource strain: Not on file  . Food insecurity:    Worry: Not on file    Inability: Not on file  . Transportation needs:    Medical: Not on file    Non-medical: Not on file  Tobacco Use  . Smoking status: Never Smoker  . Smokeless tobacco: Never Used  Substance and Sexual Activity  . Alcohol use: No    Alcohol/week: 0.0 standard drinks  . Drug use: No  . Sexual activity: Never  Lifestyle  . Physical activity:    Days per week: Not on file    Minutes per session: Not on file  . Stress: Not on file  Relationships  . Social connections:    Talks on phone: Not on file    Gets together: Not on file    Attends religious service: Not on file    Active  member of club or organization: Not on file    Attends meetings of clubs or organizations: Not on file    Relationship status: Not on file  . Intimate partner violence:    Fear of current or ex partner: Not on file    Emotionally abused: Not on file    Physically abused: Not on file    Forced sexual activity: Not on file  Other Topics Concern  . 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 ABDOMINAL HYSTERECTOMY  1977  . AORTIC VALVE REPLACEMENT  03/29/2008   PERICARDIAL TISSUE VALVE  . APPENDECTOMY  ?1977  . CARDIAC CATHETERIZATION  ~ 2009  .  CATARACT EXTRACTION W/ INTRAOCULAR LENS IMPLANT Left   . CORONARY ARTERY BYPASS GRAFT  03/29/2008   LIMA TO LAD,SVG TO CX,SVG TO LEFT POSTEROLATERAL BRANCH  . HIP FRACTURE SURGERY Right 06/2010   "3 metal screws"   . INSERT / REPLACE / REMOVE PACEMAKER  11/05/2014  . LOOP RECORDER EXPLANT N/A 11/05/2014   Procedure: LOOP RECORDER EXPLANT;  Surgeon: [name], MD;  Location: [facility];  Service: Cardiovascular;  Laterality: N/A;  . LOOP RECORDER IMPLANT N/A 09/17/2013   Procedure: LOOP RECORDER IMPLANT;  Surgeon: [name], MD;  Location: [facility];  Service: Cardiovascular;  Laterality: N/A;  . PERMANENT PACEMAKER INSERTION N/A 11/05/2014   Procedure: PERMANENT PACEMAKER INSERTION;  Surgeon: [name], MD;  Location: [facility];  Service: Cardiovascular;  Laterality: N/A;  . REFRACTIVE SURGERY Bilateral    [name] 04/28/2008 (08/17/2013)  . RETINAL DETACHMENT SURGERY Left 1994   [name] 04/10/2008 (08/17/2013)  . TIBIAL TUBERCLERPLASTY  11/05/2014  . tooth pulled   06/2018  . TOTAL HIP ARTHROPLASTY Right 07/31/2018   Procedure: RIGHT TOTAL HIP ARTHROPLASTY ANTERIOR APPROACH;  Surgeon: Gean Birchwood, MD;  Location: [facility];  Service: Orthopedics;  Laterality: Right;  . UTERINE FIBROID SURGERY  1960's    Family History  Problem Relation Age of Onset  . Heart failure Mother   . Diabetes Mother   . Hypertension Mother   . Hyperlipidemia Mother   . Melanoma Grandchild   . Colon cancer Neg Hx     Allergies  Allergen Reactions  . Codeine Nausea And Vomiting and Other (See Comments)    Patient gets violently ill  . Morphine And Related Nausea And Vomiting and Other (See Comments)    Patient get violently ill    Current Outpatient Medications on File Prior to Visit  Medication Sig Dispense Refill  . alendronate (FOSAMAX) 70 MG tablet TAKE 1 TAB BY MOUTH ONCE A WEEK. ON SUNDAYS, TAKE WITH A FULL GLASS OF WATER ON AN EMPTY STOMACH. (Patient taking differently: Take 70 mg by mouth  every Monday. ) 12 tablet 1  . amLODipine (NORVASC) 10 MG tablet Take 1 tablet (10 mg total) by mouth daily. 90 tablet 3  . aspirin EC 81 MG tablet Take 1 tablet (81 mg total) by mouth 2 (two) times daily. 60 tablet 0  . atorvastatin (LIPITOR) 40 MG tablet TAKE 1 TABLET BY MOUTH EVERY DAY IN THE EVENING FOR CHOLESTEROL (Patient taking differently: Take 40 mg by mouth every evening. TAKE 1 TABLET BY MOUTH EVERY DAY IN THE EVENING FOR  CHOLESTEROL) 90 tablet 3  . brimonidine (ALPHAGAN) 0.2 % ophthalmic solution Place 1 drop into the left eye 2 (two) times daily.  3  . cetirizine (ZYRTEC) 10 MG tablet Take 10 mg by mouth daily.    Marland Kitchen FLUoxetine (PROZAC) 10 MG capsule TAKE 1 CAPSULE (10 MG TOTAL) BY MOUTH DAILY. FOR ANXIETY AND DEPRESSION 90 capsule 1  . furosemide (LASIX) 40 MG tablet Take 1 tablet (40 mg total) by mouth daily. 90 tablet 2  . levothyroxine (SYNTHROID, LEVOTHROID) 50 MCG tablet Take 1 tablet (50 mcg total) by mouth daily before breakfast. 90 tablet 1  . losartan (COZAAR) 100 MG tablet TAKE 1 TABLET BY MOUTH EVERY DAY IN THE MORNING 90 tablet 1  . LUMIGAN 0.01 % SOLN Place 1 drop into the left eye at bedtime.  4  . Memantine HCl-Donepezil HCl (NAMZARIC) 28-10 MG CP24 Take 1 capsule by mouth daily. 90 capsule 3  . oxybutynin (DITROPAN-XL) 5 MG 24 hr tablet TAKE 1 TABLET (5 MG TOTAL) BY MOUTH AT BEDTIME. FOR URINARY INCONTINENCE. 90 tablet 3  . oxyCODONE-acetaminophen (PERCOCET/ROXICET) 5-325 MG tablet Take 1 tablet by mouth every 4 (four) hours as needed for severe pain. 30 tablet 0  . ranitidine (ZANTAC 150 MAXIMUM STRENGTH) 150 MG tablet Take 150 mg by mouth 2 (two) times daily.     Marland Kitchen tiZANidine (ZANAFLEX) 2 MG tablet Take 1 tablet (2 mg total) by mouth every 6 (six) hours as needed. 60 tablet 0  . WIXELA INHUB 250-50 MCG/DOSE AEPB INHALE 1 PUFF INTO THE LUNGS 2 (TWO) TIMES DAILY. FOR COPD (Patient taking differently: Inhale 1 puff into the lungs 2 (two) times daily. ) 180 each 3  .  amoxicillin (AMOXIL) 500 MG tablet Take 2,000 mg by mouth See admin instructions. Take 2,000mg  by mouth 1 hour prior to dental appointments  1  . Memantine HCl-Donepezil HCl (NAMZARIC) 7 & 14 & 21 &28 -10 MG C4PK Take 1 capsule by mouth daily. Follow instructions on starter pack (Patient not taking: Reported on 09/13/2018) 28 each 0   No current facility-administered medications on file prior to visit.     BP 114/60   Pulse 65   Temp 98.2 F (36.8 C) (Oral)   Ht 5\' 9"  (1.753 m)   Wt 156 lb 4 oz (70.9 kg)   SpO2 98%   BMI 23.07 kg/m    Objective:   Physical Exam  Constitutional: She appears well-nourished. She does not appear ill.  HENT:  Right Ear: Tympanic membrane and ear canal normal.  Left Ear: Tympanic membrane and ear canal normal.  Nose: No mucosal edema. Right sinus exhibits no maxillary sinus tenderness and no frontal sinus tenderness. Left sinus exhibits no maxillary sinus tenderness and no frontal sinus tenderness.  Mouth/Throat: Oropharynx is clear and moist.  Neck: Neck supple.  Cardiovascular: Normal rate and regular rhythm.  Respiratory: Effort normal. She has no wheezes. She has rhonchi in the right lower field and the left lower field.  Congested cough during exam  Neurological: She is alert.  Follows commands  Skin: Skin is warm and dry.           Assessment & Plan:  Acute Bronchitis:  Cough, congestion x 2 weeks. No improvement. Exam today suspicious for bacterial involvement. Rx for Augmentin course sent to pharmacy.  Follow up PRN.  Doreene Nest, NP

## 2018-09-14 ENCOUNTER — Other Ambulatory Visit: Payer: Self-pay | Admitting: Cardiovascular Disease

## 2018-09-24 ENCOUNTER — Other Ambulatory Visit: Payer: Self-pay | Admitting: Primary Care

## 2018-09-24 DIAGNOSIS — M8580 Other specified disorders of bone density and structure, unspecified site: Secondary | ICD-10-CM

## 2018-10-16 DIAGNOSIS — J449 Chronic obstructive pulmonary disease, unspecified: Secondary | ICD-10-CM

## 2018-10-16 MED ORDER — PREDNISONE 20 MG PO TABS: ORAL_TABLET | ORAL | 0 refills | Status: DC

## 2018-10-26 ENCOUNTER — Encounter: Payer: Self-pay | Admitting: Family Medicine

## 2018-10-26 ENCOUNTER — Ambulatory Visit (INDEPENDENT_AMBULATORY_CARE_PROVIDER_SITE_OTHER)
Admission: RE | Admit: 2018-10-26 | Discharge: 2018-10-26 | Disposition: A | Discharge status: Home / Self Care | Payer: Medicare Other | Source: Ambulatory Visit | Attending: Family Medicine | Admitting: Family Medicine

## 2018-10-26 ENCOUNTER — Ambulatory Visit (INDEPENDENT_AMBULATORY_CARE_PROVIDER_SITE_OTHER): Payer: Medicare Other | Admitting: Family Medicine

## 2018-10-26 VITALS — BP 104/52 | HR 68 | Temp 98.7°F | Ht 69.0 in | Wt 157.8 lb

## 2018-10-26 DIAGNOSIS — L570 Actinic keratosis: Secondary | ICD-10-CM | POA: Diagnosis not present

## 2018-10-26 DIAGNOSIS — J449 Chronic obstructive pulmonary disease, unspecified: Secondary | ICD-10-CM

## 2018-10-26 DIAGNOSIS — R05 Cough: Secondary | ICD-10-CM

## 2018-10-26 DIAGNOSIS — R059 Cough, unspecified: Secondary | ICD-10-CM

## 2018-10-26 MED ORDER — MONTELUKAST SODIUM 10 MG PO TABS: 10.0000 mg | ORAL_TABLET | Freq: Every day | ORAL | 3 refills | Status: DC

## 2018-10-26 NOTE — Patient Instructions (Addendum)
Your Chest X-ray did not show any signs of of infection.   As we discussed you have a chronic lung disease.   You cough is likely due to both the chronic lung disease and sinus congestion.   #Sinus Issue - Stop zyrtec and start Singular - Sometimes people with lung disease who need inhalers do better on singular - if you do not notice a difference - then stick with whichever is less expensive  -- Start a daily saline rinse (like a neti pot) -- this will flush out any sinus congestion. If not tolerated, then use a daily saline spray -- Start daily flonase -- if improving can use 2 sprays per nostril for a short period of time.   #Cough/Lung disease - I am placing a referral to pulmonology to see if medication for your long acting inhaler should be changed - You can try using the albuterol if actually short of breath, wheezing, or long coughing fit -- if it notice it does not help do no use it for those symptoms  #Buttock lesion - As we discussed this is a non-cancer lesion - we can remove it with liquid nitrogen if it is becoming bothersome or growing in size - follow-up with Doreene Nest, NP to discuss removal

## 2018-10-26 NOTE — Assessment & Plan Note (Signed)
Advised watch and wait and return if growing or severe

## 2018-10-26 NOTE — Progress Notes (Signed)
Subjective:     [name] is a 85 y.o. female presenting for Cough (Since 09/02/2018. Patient saw Raynelle Chary on 09/13/18 and was given Amox, symptoms subsided some but did not resolve. Patient was also given Prednisone in December and symptosm get better but then cough gets worse and chest congestion gets worse again as soon as she gets off the medications. Patient has also taking Mucinex DM, Nyquil and DayQuil.) and Bump on bottom area (noticed today. If able to address this today)     Cough  This is a new problem. The current episode started more than 1 month ago. The problem occurs hourly. The cough is non-productive. Associated symptoms include nasal congestion, postnasal drip, rhinorrhea and shortness of breath. Pertinent negatives include no chest pain, chills, ear congestion, ear pain, fever, headaches, heartburn, hemoptysis, sore throat or wheezing. Associated symptoms comments: Chest congestion. She has tried OTC cough suppressant and oral steroids (antibiotics) for the symptoms.   Was taking mucinex DM and prednisone which she completed on Sunday - cough was better when on medication and has returned  She had improvement when she was on the antibiotic  Cough medication if the symptoms are severe  Takes zyrtec daily No flonase, saline rinse  Review of Systems  Constitutional: Negative for chills and fever.  HENT: Positive for postnasal drip and rhinorrhea. Negative for ear pain and sore throat.   Respiratory: Positive for cough and shortness of breath. Negative for hemoptysis and wheezing.   Cardiovascular: Negative for chest pain.  Gastrointestinal: Negative for abdominal pain, constipation, diarrhea, heartburn, nausea and vomiting.  Neurological: Negative for headaches.     11 /20/2020: clinic - cough, Treated with augmentin in November  Social History   Tobacco Use  Smoking Status Never Smoker  Smokeless Tobacco Never Used        Objective:    BP  Readings from Last 3 Encounters:  10/26/18 (!) 104/52  09/13/18 114/60  08/08/18 (!) 116/58   Wt Readings from Last 3 Encounters:  10/26/18 157 lb 12 oz (71.6 kg)  09/13/18 156 lb 4 oz (70.9 kg)  08/08/18 159 lb (72.1 kg)    BP (!) 104/52   Pulse 68   Temp 98.7 F (37.1 C)   Ht 5\' 9"  (1.753 m)   Wt 157 lb 12 oz (71.6 kg)   SpO2 96%   BMI 23.30 kg/m    Physical Exam Constitutional:      General: She is not in acute distress.    Appearance: She is well-developed. She is not diaphoretic.  HENT:     Head: Normocephalic and atraumatic.     Right Ear: Tympanic membrane and ear canal normal.     Left Ear: Tympanic membrane and ear canal normal.     Nose: Mucosal edema and rhinorrhea present.     Right Sinus: No maxillary sinus tenderness or frontal sinus tenderness.     Left Sinus: No maxillary sinus tenderness or frontal sinus tenderness.     Mouth/Throat:     Pharynx: Uvula midline. Posterior oropharyngeal erythema present. No oropharyngeal exudate.     Tonsils: Swelling: 0 on the right. 0 on the left.  Eyes:     General: No scleral icterus.    Conjunctiva/sclera: Conjunctivae normal.  Neck:     Musculoskeletal: Neck supple.  Cardiovascular:     Rate and Rhythm: Normal rate and regular rhythm.     Heart sounds: Normal heart sounds. No murmur.  Pulmonary:  Effort: Pulmonary effort is normal. No respiratory distress.     Breath sounds: Rhonchi (diffuse) present.  Lymphadenopathy:     Cervical: No cervical adenopathy.  Skin:    General: Skin is warm and dry.     Capillary Refill: Capillary refill takes less than 2 seconds.     Comments: Left lower buttock with scaly patch of skin with mildly erythematous base.   Neurological:     Mental Status: She is alert.     CXR: no consolidations, appears somewhat hyperinflated lungs      Assessment & Plan:   Problem List Items Addressed This Visit      Respiratory   COPD (chronic obstructive pulmonary disease) (HCC)     Currently on one inhaler. Suspect this may be contributing to daily cough. No wheezes on exam but rhonchi. May benefit from pulmonology to see if she needs an additional daily inhaler as already on combo inhaler. Also trial of singular instead of zyrtec      Relevant Medications   montelukast (SINGULAIR) 10 MG tablet   Other Relevant Orders   Ambulatory referral to Pulmonology     Musculoskeletal and Integument   Actinic keratosis    Advised watch and wait and return if growing or severe       Other Visit Diagnoses    Cough    -  Primary   Relevant Medications   montelukast (SINGULAIR) 10 MG tablet   Other Relevant Orders   DG Chest 2 View (Completed)   Ambulatory referral to Pulmonology     Suspect cough may be multifactorial with COPD and post-nasal drip component. Already s/p Abx and prednisone but no signs of pneumonia or acute exacerbation today.   See patient instructions for plan  Pulm referral to further evaluate COPD and medication adjustments if needed  F/u final CXR read  Return if symptoms worsen or fail to improve.  Lynnda Child, MD

## 2018-10-26 NOTE — Assessment & Plan Note (Signed)
Currently on one inhaler. Suspect this may be contributing to daily cough. No wheezes on exam but rhonchi. May benefit from pulmonology to see if she needs an additional daily inhaler as already on combo inhaler. Also trial of singular instead of zyrtec

## 2018-10-30 ENCOUNTER — Ambulatory Visit (INDEPENDENT_AMBULATORY_CARE_PROVIDER_SITE_OTHER): Payer: Medicare Other

## 2018-10-30 DIAGNOSIS — I441 Atrioventricular block, second degree: Secondary | ICD-10-CM

## 2018-10-31 LAB — CUP PACEART REMOTE DEVICE CHECK
Brady Statistic AS VS Percent: 81 %
Implantable Lead Implant Date: 20160112
Implantable Lead Implant Date: 20160112
Implantable Lead Location: 753859
Implantable Pulse Generator Implant Date: 20160112
Lead Channel Impedance Value: 492 Ohm
Lead Channel Pacing Threshold Amplitude: 0.875 V
Lead Channel Pacing Threshold Pulse Width: 0.4 ms
Lead Channel Setting Pacing Amplitude: 1.5 V
Lead Channel Setting Sensing Sensitivity: 2 mV
MDC IDC LEAD LOCATION: 753860
MDC IDC MSMT BATTERY IMPEDANCE: 180 Ohm
MDC IDC MSMT BATTERY REMAINING LONGEVITY: 153 mo
MDC IDC MSMT BATTERY VOLTAGE: 2.78 V
MDC IDC MSMT LEADCHNL RA IMPEDANCE VALUE: 473 Ohm
MDC IDC MSMT LEADCHNL RA PACING THRESHOLD AMPLITUDE: 0.625 V
MDC IDC MSMT LEADCHNL RA PACING THRESHOLD PULSEWIDTH: 0.4 ms
MDC IDC SESS DTM: 20200106135818
MDC IDC SET LEADCHNL RV PACING AMPLITUDE: 2 V
MDC IDC SET LEADCHNL RV PACING PULSEWIDTH: 0.4 ms
MDC IDC STAT BRADY AP VP PERCENT: 0 %
MDC IDC STAT BRADY AP VS PERCENT: 19 %
MDC IDC STAT BRADY AS VP PERCENT: 0 %

## 2018-10-31 NOTE — Progress Notes (Signed)
Remote pacemaker transmission.   

## 2018-11-15 ENCOUNTER — Encounter: Payer: Self-pay | Admitting: Pulmonary Disease

## 2018-11-15 ENCOUNTER — Ambulatory Visit: Payer: Medicare Other | Admitting: Pulmonary Disease

## 2018-11-15 VITALS — BP 116/60 | HR 65 | Ht 68.0 in | Wt 159.4 lb

## 2018-11-15 DIAGNOSIS — R05 Cough: Secondary | ICD-10-CM | POA: Diagnosis not present

## 2018-11-15 DIAGNOSIS — R059 Cough, unspecified: Secondary | ICD-10-CM

## 2018-11-15 MED ORDER — FLUTICASONE PROPIONATE 50 MCG/ACT NA SUSP: 1.0000 | Freq: Every day | NASAL | 2 refills | Status: DC

## 2018-11-15 NOTE — Progress Notes (Signed)
   Subjective:    Patient ID: [name], female    DOB: November 17, 1933, 85 y.o.   MRN: [id number]  HPI    Review of Systems  Constitutional: Negative for fever and unexpected weight change.  HENT: Positive for congestion, dental problem and rhinorrhea. Negative for ear pain, nosebleeds, postnasal drip, sinus pressure, sneezing, sore throat and trouble swallowing.   Eyes: Positive for itching. Negative for redness.  Respiratory: Positive for cough and shortness of breath. Negative for chest tightness and wheezing.   Cardiovascular: Negative for palpitations and leg swelling.  Gastrointestinal: Negative for nausea and vomiting.  Genitourinary: Negative for dysuria.  Musculoskeletal: Negative for joint swelling.  Skin: Negative for rash.  Allergic/Immunologic: Positive for environmental allergies. Negative for food allergies and immunocompromised state.  Neurological: Negative for headaches.  Hematological: Bruises/bleeds easily.  Psychiatric/Behavioral: The patient is not nervous/anxious.        Objective:   Physical Exam        Assessment & Plan:

## 2018-11-15 NOTE — Progress Notes (Signed)
[facility]  Chief Complaint  Patient presents with  . Consult    hx seasonal allergies with cough - dx with asthma - Nov 2019 cough started and lingers    Constitutional:  BP 116/60 (BP Location: Left Arm, Patient Position: Sitting, Cuff Size: Normal)   Pulse 65   Ht 5\' 8"  (1.727 m)   Wt 159 lb 6.4 oz (72.3 kg)   SpO2 97%   BMI 24.24 kg/m   Past Medical History:  CVA, Mobitz 2 s/p PM, Hypothyroidism, HTN, HLD, CAD s/p CABG, OA, Aortic stenosis s/p bioprosthetic valve, Anemia, Alzheimer's dementia, AAA  Brief Summary:  Lynnette CaffeyRosalie S Grist is a 85 y.o. female with cough.  She was told she had allergies and asthma years ago.  She used to use albuterol like candy.  She changed PCP's and started on different asthma and allergy regimen.  Her cough got much better.  She was using advair and then changed to wixela.  She was also using generic zyrtec.  She typically has trouble in Spring and Fall.  In November she developed more cough, and this has lingered on.  She isn't aware of anything that triggered her cough.  She was tried on antibiotics, prednisone, mucinex, and generic dayquil/nyquil.  Her cough gets better when she uses these medicines, but recurs when she stops them.  She was seen by PCP earlier this month.  She had spirometry in 2017 that showed airflow obstruction.  She was started on singulair and advised to stop generic zyrtec.  She still has some cough and clears her throat, but overall she feels better.  She isn't having sinus pressure.  She denies trouble swallowing.  She takes medications for reflux, and feels this is under control.  She denies recent fever.  No skin rash, gland swelling, weight loss, hemoptysis, or leg swelling.  She uses a walker for mobility.  [place]  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  She never smoked, but her parents and her husband were smokers.  No animal/bird exposures.  No recent travel.   Denies history of TB.  She thinks she had pneumonia years ago.  CXR 10/26/18 (reviewed by me) showed stable scarring in Lt lung base when compared to previous imaging studies.   Physical Exam:   Appearance - well kempt   ENMT - pale nasal mucosa, midline nasal  septum, no oral exudates, no LAN, trachea midline, narrow nasal angles  Respiratory - normal chest wall, normal respiratory effort, no accessory muscle use, no wheeze/rales  CV - s1s2 regular rate and rhythm, no murmurs, no peripheral edema, radial pulses symmetric  GI - soft, non tender, no masses  Lymph - no adenopathy noted in neck and axillary areas  MSK - uses a walker  Ext - no cyanosis, clubbing, or joint inflammation noted  Skin - no rashes, lesions, or ulcers  Neuro - normal strength, oriented x 3  Psych - normal mood and affect  Discussion:  She has persistent cough.  She has history of allergies and asthma.  She has second hand tobacco exposure.  Previous spirometry was suggestive of air flow obstruction.  She has some improvement in her symptoms with addition of singulair.  Assessment/Plan:   Persistent asthma. - continue wixela and singulair - will arrange for full PFTs to better assess whether she has COPD - prn albuterol - discussed roles for her different medications  Upper airway cough with post nasal drip. -  nasal irrigation and flonase - advised her to avoid forcing cough or throat clearing - if her cough persists, then she can resume OTC antihistamine; explained how she can develop tolerance to antihistamines with prolonged use  Chronic cough. - can continue prn mucinex - advised her to try limiting use of dayquil/nyquil  History of reflux. - this doesn't seem to be a prominent issue at present - if cough persists, then she might need further GI assessment   Patient Instructions  Continue using wixela and singulair Ventolin two puffs every 6 hours as needed for cough, wheeze, chest  congestion, or shortness of breath Nasal irrigation (saline nasal spray) daily for next two weeks, then as needed Flonase 1 spray in each nostril daily for next two weeks, then as needed Can use generic version of zyrtec as needed for allergies or sinus congestion Will schedule pulmonary function test  Follow up in 3 to 4 weeks    Coralyn Helling, MD [facility] Pager: [phone] 11/15/2018, 1:06 PM  Flow Sheet     Pulmonary tests:  Spirometry 8/02/17FEV1 1.84 (87%), FEV1% 63  Cardiac tests:  Echo 01/26/17 >> EF 60 to 65%, grade 2 DD, bioprosthetic AV   Review of Systems:  Constitutional: Negative for fever and unexpected weight change.  HENT: Positive for congestion, dental problem and rhinorrhea. Negative for ear pain, nosebleeds, postnasal drip, sinus pressure, sneezing, sore throat and trouble swallowing.   Eyes: Positive for itching. Negative for redness.  Respiratory: Positive for cough and shortness of breath. Negative for chest tightness and wheezing.   Cardiovascular: Negative for palpitations and leg swelling.  Gastrointestinal: Negative for nausea and vomiting.  Genitourinary: Negative for dysuria.  Musculoskeletal: Negative for joint swelling.  Skin: Negative for rash.  Allergic/Immunologic: Positive for environmental allergies. Negative for food allergies and immunocompromised state.  Neurological: Negative for headaches.  Hematological: Bruises/bleeds easily.  Psychiatric/Behavioral: The patient is not nervous/anxious.    Medications:   Allergies as of 11/15/2018      Reactions   Codeine Nausea And Vomiting, Other (See Comments)   Patient gets violently ill   Morphine And Related Nausea And Vomiting, Other (See Comments)   Patient get violently ill      Medication List       Accurate as of November 15, 2018  1:06 PM. Always use your most recent med list.        alendronate 70 MG tablet Commonly known as:  FOSAMAX TAKE 1 TAB BY  MOUTH ONCE A WEEK. ON SUNDAYS, TAKE WITH A FULL GLASS OF WATER ON AN EMPTY STOMACH.   amLODipine 10 MG tablet Commonly known as:  NORVASC Take 1 tablet (10 mg total) by mouth daily.   amoxicillin 500 MG tablet Commonly known as:  AMOXIL Take 2,000 mg by mouth See admin instructions. Take 2,000mg  by mouth 1 hour prior to dental appointments   aspirin EC 81 MG tablet Take 1 tablet (81 mg total) by mouth 2 (two) times daily.   atorvastatin 40 MG tablet Commonly known as:  LIPITOR TAKE 1 TABLET BY MOUTH EVERY DAY IN THE EVENING FOR CHOLESTEROL   brimonidine 0.2 % ophthalmic solution Commonly known as:  ALPHAGAN Place 1 drop into the left eye 2 (two) times daily.   FLUoxetine 10 MG capsule Commonly known as:  PROZAC TAKE 1 CAPSULE (10 MG TOTAL) BY MOUTH DAILY. FOR ANXIETY AND DEPRESSION   fluticasone 50 MCG/ACT nasal spray Commonly known as:  FLONASE Place 1 spray into both nostrils  daily.   furosemide 40 MG tablet Commonly known as:  LASIX TAKE 1 TABLET BY MOUTH EVERY DAY   levothyroxine 50 MCG tablet Commonly known as:  SYNTHROID, LEVOTHROID Take 1 tablet (50 mcg total) by mouth daily before breakfast.   losartan 100 MG tablet Commonly known as:  COZAAR TAKE 1 TABLET BY MOUTH EVERY DAY IN THE MORNING   montelukast 10 MG tablet Commonly known as:  SINGULAIR Take 1 tablet (10 mg total) by mouth at bedtime.   oxybutynin 5 MG 24 hr tablet Commonly known as:  DITROPAN-XL TAKE 1 TABLET (5 MG TOTAL) BY MOUTH AT BEDTIME. FOR URINARY INCONTINENCE.   WIXELA INHUB 250-50 MCG/DOSE Aepb Generic drug:  Fluticasone-Salmeterol INHALE 1 PUFF INTO THE LUNGS 2 (TWO) TIMES DAILY. FOR COPD   ZANTAC 150 MAXIMUM STRENGTH 150 MG tablet Generic drug:  ranitidine Take 150 mg by mouth 2 (two) times daily.       Past Surgical History:  She  has a past surgical history that includes Coronary artery bypass graft (03/29/2008); Aortic valve replacement (03/29/2008); Uterine fibroid surgery  (1960's); Retinal detachment surgery (Left, 1994); Cataract extraction w/ intraocular lens implant (Left); Abdominal hysterectomy (1977); Appendectomy (?1977); Hip fracture surgery (Right, 06/2010); Refractive surgery (Bilateral); Cardiac catheterization (~ 2009); loop recorder implant (N/A, 09/17/2013); Insert / replace / remove pacemaker (11/05/2014); Tibial tuberclerplasty (11/05/2014); loop recorder explant (N/A, 11/05/2014); permanent pacemaker insertion (N/A, 11/05/2014); tooth pulled  (06/2018); and Total hip arthroplasty (Right, 07/31/2018).  Family History:  Her family history includes Diabetes in her mother; Heart failure in her mother; Hyperlipidemia in her mother; Hypertension in her mother; Melanoma in her grandchild.  Social History:  She  reports that she has never smoked. She has never used smokeless tobacco. She reports that she does not drink alcohol or use drugs.

## 2018-11-15 NOTE — Patient Instructions (Signed)
Continue using wixela and singulair Ventolin two puffs every 6 hours as needed for cough, wheeze, chest congestion, or shortness of breath Nasal irrigation (saline nasal spray) daily for next two weeks, then as needed Flonase 1 spray in each nostril daily for next two weeks, then as needed Can use generic version of zyrtec as needed for allergies or sinus congestion Will schedule pulmonary function test  Follow up in 3 to 4 weeks

## 2018-12-06 ENCOUNTER — Ambulatory Visit (INDEPENDENT_AMBULATORY_CARE_PROVIDER_SITE_OTHER): Payer: Medicare Other | Admitting: Pulmonary Disease

## 2018-12-06 ENCOUNTER — Ambulatory Visit: Payer: Medicare Other | Admitting: Pulmonary Disease

## 2018-12-06 ENCOUNTER — Encounter: Payer: Self-pay | Admitting: Pulmonary Disease

## 2018-12-06 VITALS — BP 114/58 | HR 80 | Ht 67.5 in | Wt 161.0 lb

## 2018-12-06 DIAGNOSIS — J4530 Mild persistent asthma, uncomplicated: Secondary | ICD-10-CM | POA: Diagnosis not present

## 2018-12-06 DIAGNOSIS — R059 Cough, unspecified: Secondary | ICD-10-CM

## 2018-12-06 DIAGNOSIS — R05 Cough: Secondary | ICD-10-CM

## 2018-12-06 DIAGNOSIS — J301 Allergic rhinitis due to pollen: Secondary | ICD-10-CM | POA: Diagnosis not present

## 2018-12-06 LAB — PULMONARY FUNCTION TEST
DL/VA % PRED: 96 %
DL/VA: 3.82 ml/min/mmHg/L
DLCO unc % pred: 75 %
DLCO unc: 15.98 ml/min/mmHg
FEF 25-75 POST: 1.99 L/s
FEF 25-75 Pre: 1.9 L/sec
FEF2575-%Change-Post: 5 %
FEF2575-%Pred-Post: 130 %
FEF2575-%Pred-Pre: 124 %
FEV1-%CHANGE-POST: 0 %
FEV1-%PRED-PRE: 102 %
FEV1-%Pred-Post: 101 %
FEV1-Post: 2.27 L
FEV1-Pre: 2.29 L
FEV1FVC-%Change-Post: -1 %
FEV1FVC-%PRED-PRE: 106 %
FEV6-%Change-Post: 2 %
FEV6-%PRED-POST: 103 %
FEV6-%Pred-Pre: 100 %
FEV6-Post: 2.93 L
FEV6-Pre: 2.86 L
FEV6FVC-%Change-Post: 2 %
FEV6FVC-%Pred-Post: 105 %
FEV6FVC-%Pred-Pre: 103 %
FVC-%Change-Post: 0 %
FVC-%Pred-Post: 98 %
FVC-%Pred-Pre: 97 %
FVC-Post: 2.94 L
FVC-Pre: 2.93 L
PRE FEV6/FVC RATIO: 98 %
Post FEV1/FVC ratio: 77 %
Post FEV6/FVC ratio: 100 %
Pre FEV1/FVC ratio: 78 %
RV % pred: 133 %
RV: 3.48 L
TLC % PRED: 111 %
TLC: 6.29 L

## 2018-12-06 NOTE — Patient Instructions (Signed)
Follow up in 4 months 

## 2018-12-06 NOTE — Progress Notes (Signed)
[name]  Chief Complaint  Patient presents with  . Follow-up    pt had pft today; having shortness of breath with activity    Constitutional:  BP (!) 114/58 (BP Location: Right Arm, Cuff Size: Normal)   Pulse 80   Ht 5' 7.5" (1.715 m)   Wt 161 lb (73 kg)   SpO2 95%   BMI 24.84 kg/m   Past Medical History:  CVA, Mobitz 2 s/p PM, Hypothyroidism, HTN, HLD, CAD s/p CABG, OA, Aortic stenosis s/p bioprosthetic valve, Anemia, Alzheimer's dementia, AAA  Brief Summary:  [name] is a 85 y.o. female with cough.  She is with her daughter.    She had PFT today.  Mild diffusion defect that corrects for lung volumes.  Otherwise normal.  Still has intermittent cough, but not as bad as before.  Usually in the morning.  Will last a brief period of time and then is okay.  Not having wheeze, chest pain, fever, or hemoptysis.    Physical Exam:   Appearance - well kempt   ENMT - no sinus tenderness, no nasal discharge, no oral exudate, narrow nasal angles  Neck - no masses, trachea midline, no thyromegaly, no elevation in JVP  Respiratory - normal appearance of chest wall, normal respiratory effort w/o accessory muscle use, no dullness on percussion, no wheezing or rales  CV - s1s2 regular rate and rhythm, no murmurs, no peripheral edema, radial pulses symmetric  GI - soft, non tender  Lymph - no adenopathy noted in neck and axillary areas  MSK - uses a walker  Ext - no cyanosis, clubbing, or joint inflammation noted  Skin - no rashes, lesions, or ulcers  Neuro - normal strength, oriented x 3  Psych - normal mood and affect   Assessment/Plan:   Persistent asthma. - has improvement with addition of singulair to wixela - PFT unremarkable; no evidence for COPD as was previous concern - prn albuterol - if symptoms remain stable, then consider d/c'ing LABA at next visit and continue ICS and leukotriene inhibitor  Upper airway  cough with post nasal drip. - improved - prn nasal irrigation and flonase  History of reflux. - stable - zantac prescribed by PCP   Patient Instructions  Follow up in 4 months  A total of  26 minutes were spent face to face with the patient and more than half of that time involved counseling or coordination of care.   Coralyn Helling, MD [facility] Pager: [phone] 12/06/2018, 4:15 PM  Flow Sheet     Pulmonary tests:  Spirometry 05/26/16 >> FEV1 1.84 (87%), FEV1% 63 PFT 12/06/18 >> FEV1 2.29 (102%), FEV1% 78, TLC 6.29 (111%), DLCO 75%, no BD  Cardiac tests:  Echo 01/26/17 >> EF 60 to 65%, grade 2 DD, bioprosthetic AV  Medications:   Allergies as of 12/06/2018      Reactions   Codeine Nausea And Vomiting, Other (See Comments)   Patient gets violently ill   Morphine And Related Nausea And Vomiting, Other (See Comments)   Patient get violently ill      Medication List       Accurate as of December 06, 2018  4:15 PM. Always use your most recent med list.        alendronate 70 MG tablet Commonly known as:  FOSAMAX TAKE 1 TAB BY MOUTH ONCE A WEEK. ON SUNDAYS, TAKE WITH A FULL GLASS OF WATER ON AN EMPTY STOMACH.  amLODipine 10 MG tablet Commonly known as:  NORVASC Take 1 tablet (10 mg total) by mouth daily.   amoxicillin 500 MG tablet Commonly known as:  AMOXIL Take 2,000 mg by mouth See admin instructions. Take 2,000mg  by mouth 1 hour prior to dental appointments   aspirin EC 81 MG tablet Take 1 tablet (81 mg total) by mouth 2 (two) times daily.   atorvastatin 40 MG tablet Commonly known as:  LIPITOR TAKE 1 TABLET BY MOUTH EVERY DAY IN THE EVENING FOR CHOLESTEROL   brimonidine 0.2 % ophthalmic solution Commonly known as:  ALPHAGAN Place 1 drop into the left eye 2 (two) times daily.   FLUoxetine 10 MG capsule Commonly known as:  PROZAC TAKE 1 CAPSULE (10 MG TOTAL) BY MOUTH DAILY. FOR ANXIETY AND DEPRESSION   fluticasone 50 MCG/ACT  nasal spray Commonly known as:  FLONASE Place 1 spray into both nostrils daily.   furosemide 40 MG tablet Commonly known as:  LASIX TAKE 1 TABLET BY MOUTH EVERY DAY   levothyroxine 50 MCG tablet Commonly known as:  SYNTHROID, LEVOTHROID Take 1 tablet (50 mcg total) by mouth daily before breakfast.   losartan 100 MG tablet Commonly known as:  COZAAR TAKE 1 TABLET BY MOUTH EVERY DAY IN THE MORNING   montelukast 10 MG tablet Commonly known as:  SINGULAIR Take 1 tablet (10 mg total) by mouth at bedtime.   oxybutynin 5 MG 24 hr tablet Commonly known as:  DITROPAN-XL TAKE 1 TABLET (5 MG TOTAL) BY MOUTH AT BEDTIME. FOR URINARY INCONTINENCE.   WIXELA INHUB 250-50 MCG/DOSE Aepb Generic drug:  Fluticasone-Salmeterol INHALE 1 PUFF INTO THE LUNGS 2 (TWO) TIMES DAILY. FOR COPD   ZANTAC 150 MAXIMUM STRENGTH 150 MG tablet Generic drug:  ranitidine Take 150 mg by mouth 2 (two) times daily.       Past Surgical History:  She  has a past surgical history that includes Coronary artery bypass graft (03/29/2008); Aortic valve replacement (03/29/2008); Uterine fibroid surgery (1960's); Retinal detachment surgery (Left, 1994); Cataract extraction w/ intraocular lens implant (Left); Abdominal hysterectomy (1977); Appendectomy (?1977); Hip fracture surgery (Right, 06/2010); Refractive surgery (Bilateral); Cardiac catheterization (~ 2009); loop recorder implant (N/A, 09/17/2013); Insert / replace / remove pacemaker (11/05/2014); Tibial tuberclerplasty (11/05/2014); loop recorder explant (N/A, 11/05/2014); permanent pacemaker insertion (N/A, 11/05/2014); tooth pulled  (06/2018); and Total hip arthroplasty (Right, 07/31/2018).  Family History:  Her family history includes Diabetes in her mother; Heart failure in her mother; Hyperlipidemia in her mother; Hypertension in her mother; Melanoma in her grandchild.  Social History:  She  reports that she has never smoked. She has never used smokeless tobacco. She  reports that she does not drink alcohol or use drugs.

## 2018-12-06 NOTE — Progress Notes (Signed)
PFT done today. 

## 2018-12-11 ENCOUNTER — Other Ambulatory Visit: Payer: Self-pay | Admitting: Primary Care

## 2018-12-11 DIAGNOSIS — F3342 Major depressive disorder, recurrent, in full remission: Secondary | ICD-10-CM

## 2018-12-18 ENCOUNTER — Other Ambulatory Visit: Payer: Self-pay | Admitting: Primary Care

## 2018-12-18 DIAGNOSIS — I10 Essential (primary) hypertension: Secondary | ICD-10-CM

## 2018-12-27 ENCOUNTER — Encounter: Payer: Self-pay | Admitting: Primary Care

## 2018-12-27 ENCOUNTER — Telehealth: Payer: Self-pay | Admitting: Primary Care

## 2018-12-27 ENCOUNTER — Ambulatory Visit (INDEPENDENT_AMBULATORY_CARE_PROVIDER_SITE_OTHER): Payer: Medicare Other | Admitting: Primary Care

## 2018-12-27 DIAGNOSIS — I471 Supraventricular tachycardia: Secondary | ICD-10-CM | POA: Diagnosis not present

## 2018-12-27 DIAGNOSIS — I10 Essential (primary) hypertension: Secondary | ICD-10-CM

## 2018-12-27 DIAGNOSIS — N183 Chronic kidney disease, stage 3 unspecified: Secondary | ICD-10-CM | POA: Insufficient documentation

## 2018-12-27 DIAGNOSIS — G301 Alzheimer's disease with late onset: Secondary | ICD-10-CM

## 2018-12-27 DIAGNOSIS — F3342 Major depressive disorder, recurrent, in full remission: Secondary | ICD-10-CM

## 2018-12-27 DIAGNOSIS — M1611 Unilateral primary osteoarthritis, right hip: Secondary | ICD-10-CM

## 2018-12-27 DIAGNOSIS — F0280 Dementia in other diseases classified elsewhere without behavioral disturbance: Secondary | ICD-10-CM

## 2018-12-27 DIAGNOSIS — E7800 Pure hypercholesterolemia, unspecified: Secondary | ICD-10-CM

## 2018-12-27 DIAGNOSIS — I2510 Atherosclerotic heart disease of native coronary artery without angina pectoris: Secondary | ICD-10-CM

## 2018-12-27 DIAGNOSIS — Z950 Presence of cardiac pacemaker: Secondary | ICD-10-CM

## 2018-12-27 NOTE — Progress Notes (Signed)
Subjective:    Patient ID: [name], female    DOB: 11-15-1933, 85 y.o.   MRN: [id number]  HPI  Ms. Longwell is an 85 year old female who presents today for follow up.  1) AAA, CAD, MPV, Second Degree Heart Block, Hyperlipidemia: Currently following with cardiology and electrophysiology. Pacemaker in place. Last pacemaker check was in early January 2020, not pacemaker dependent with occasional atrial tachycardia. Managed on furosemide 40 mg, losartan 100 mg, amlodipine 10 mg, atorvastatin 40 mg. LDL of 60 from labs in August 2019. Due for follow up due in April 2020. She denies chest pain, shortness of breath, dizziness.   BP Readings from Last 3 Encounters:  12/27/18 124/72  12/06/18 (!) 114/58  11/15/18 116/60     2) Chronic Cough/COPD: Currently managed on Wixela 250-50 BID, Singulair 10 mg daily. Also managed on ranitidine 150 mg BID. Following with pulmonology with last visit being in mid February 2020. PFT's during that visit with mild diffusion defect. Endorsed chronic cough that was overall improved.   Her daughter endorses that her breathing is labored when doing mild activities. She is mostly sedentary during the day. Her pulmonologist believes that her symptoms are moreso from allergies rather than COPD/asthma. The plan is for her to return in June to determine the need for changes in inhaler from LABA/ICS to ICS.  3) Anxiety and Depression: Currently managed on fluoxetine 10 mg. Denies SI/HI.   4) CKD Stage III: GFR ranging low 40's to low 50's. Last BMP was in November 2019 with GFR of 51 and creatinine of 1.08, improved from prior. She is drinking more water, less diet soda.   5) Dementia: Currently managed on memanitine-donepezil per neurology. Next follow up visit is scheduled for June. Her daughter continues to struggle with encouraging safety with her mother, reminding her to use her walker and allowing other to assist. Her mother is sedentary for most of the day  despite encouragement from caregiver to become active. She is compliant to her medication. No recent falls. No depression/anxiety during the evening, she is sleeping well.   Review of Systems  Respiratory: Negative for shortness of breath and wheezing.        Cough improved  Cardiovascular: Negative for chest pain.  Allergic/Immunologic: Positive for environmental allergies.  Neurological: Negative for dizziness and headaches.       See HPI.  Psychiatric/Behavioral: Negative for sleep disturbance. The patient is not nervous/anxious.        Past Medical History:  Diagnosis Date  . AAA (abdominal aortic aneurysm) (HCC) 01/03/13   3.5x3.3cm  . Alzheimer's dementia (HCC)    "probably middle stage" (11/05/2014)  . Anemia    "hx of chronic" (11/05/2014)  . Aortic stenosis 03/29/2008   biological prosthetic replacement  . Arthritis    "joints" (11/05/2014)  . Asthma   . Chronic asthmatic bronchitis (HCC)    "q fall and winter" (11/05/2014)  . Coronary artery disease    CABG 03/29/08  . Dyslipidemia   . Exertional shortness of breath   . Family history of adverse reaction to anesthesia    "daughter gets PONV too"  . Heart murmur   . Hypertension   . Hypothyroidism   . LBBB (left bundle branch block)   . Migraines    "none in years' (11/05/2014)  . Mobitz type 2 second degree AV block 10/31/2014  . MVP (mitral valve prolapse)    with mild mitral insufficiency/notes 08/17/2013  . PONV (postoperative nausea  and vomiting)   . Presence of permanent cardiac pacemaker   . Sinus pause 10/31/2014  . Stroke [facility]) 03/2008   "2 light strokes"; denies residual on 11/05/2014  . Urinary frequency      Social History   Socioeconomic History  . Marital status: Divorced    Spouse name: Not on file  . Number of children: 1  . Years of education: Some college  . Highest education level: Not on file  Occupational History  . Occupation: Retired  Engineer, production  . Financial resource strain: Not on file    . Food insecurity:    Worry: Not on file    Inability: Not on file  . Transportation needs:    Medical: Not on file    Non-medical: Not on file  Tobacco Use  . Smoking status: Never Smoker  . Smokeless tobacco: Never Used  Substance and Sexual Activity  . Alcohol use: No    Alcohol/week: 0.0 standard drinks  . Drug use: No  . Sexual activity: Never  Lifestyle  . Physical activity:    Days per week: Not on file    Minutes per session: Not on file  . Stress: Not on file  Relationships  . Social connections:    Talks on phone: Not on file    Gets together: Not on file    Attends religious service: Not on file    Active member of club or organization: Not on file    Attends meetings of clubs or organizations: Not on file    Relationship status: Not on file  . Intimate partner violence:    Fear of current or ex partner: Not on file    Emotionally abused: Not on file    Physically abused: Not on file    Forced sexual activity: Not on file  Other Topics Concern  . 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 ABDOMINAL HYSTERECTOMY  1977  . AORTIC VALVE REPLACEMENT  03/29/2008   PERICARDIAL TISSUE VALVE  . APPENDECTOMY  ?1977  . CARDIAC CATHETERIZATION  ~ 2009  . CATARACT EXTRACTION W/ INTRAOCULAR LENS IMPLANT Left   . CORONARY ARTERY BYPASS GRAFT  03/29/2008   LIMA TO LAD,SVG TO CX,SVG TO LEFT POSTEROLATERAL BRANCH  . HIP FRACTURE SURGERY Right 06/2010   "3 metal screws"   . INSERT / REPLACE / REMOVE PACEMAKER  11/05/2014  . LOOP RECORDER EXPLANT N/A 11/05/2014   Procedure: LOOP RECORDER EXPLANT;  Surgeon: [name], MD;  Location: [facility];  Service: Cardiovascular;  Laterality: N/A;  . LOOP RECORDER IMPLANT N/A 09/17/2013   Procedure: LOOP  RECORDER IMPLANT;  Surgeon: [name], MD;  Location: [facility];  Service: Cardiovascular;  Laterality: N/A;  . PERMANENT PACEMAKER INSERTION N/A 11/05/2014   Procedure: PERMANENT PACEMAKER INSERTION;  Surgeon: [name], MD;  Location: [facility];  Service: Cardiovascular;  Laterality: N/A;  . REFRACTIVE SURGERY Bilateral    [name] 04/28/2008 (08/17/2013)  . RETINAL DETACHMENT SURGERY Left 1994   [name] 04/10/2008 (08/17/2013)  . TIBIAL TUBERCLERPLASTY  11/05/2014  . tooth pulled   06/2018  . TOTAL HIP ARTHROPLASTY Right 07/31/2018   Procedure: RIGHT TOTAL HIP ARTHROPLASTY ANTERIOR APPROACH;  Surgeon: Gean Birchwood, MD;  Location: [facility];  Service: Orthopedics;  Laterality: Right;  . UTERINE FIBROID SURGERY  1960's    Family History  Problem Relation Age of Onset  . Heart failure Mother   . Diabetes Mother   . Hypertension Mother   . Hyperlipidemia Mother   . Melanoma Grandchild   . Colon cancer Neg Hx     Allergies  Allergen Reactions  . Codeine Nausea And Vomiting and Other (See Comments)    Patient gets violently ill  . Morphine And Related Nausea And Vomiting and Other (See Comments)    Patient get violently ill    Current Outpatient Medications on File Prior to Visit  Medication Sig Dispense Refill  . alendronate (FOSAMAX) 70 MG tablet TAKE 1 TAB BY MOUTH ONCE A WEEK. ON SUNDAYS, TAKE WITH A FULL GLASS OF WATER ON AN EMPTY STOMACH. 12 tablet 1  . amLODipine (NORVASC) 10 MG tablet TAKE 1 TABLET BY MOUTH EVERY DAY 90 tablet 1  . amoxicillin (AMOXIL) 500 MG tablet Take 2,000 mg by mouth See admin instructions. Take 2,000mg  by mouth 1 hour prior to dental appointments  1  . aspirin EC 81 MG tablet Take 1 tablet (81 mg total) by mouth 2 (two) times daily. 60 tablet 0  . atorvastatin (LIPITOR) 40 MG tablet TAKE 1 TABLET BY MOUTH EVERY DAY IN THE EVENING FOR CHOLESTEROL (Patient taking differently: Take 40 mg by mouth every evening. TAKE 1 TABLET BY MOUTH EVERY DAY IN THE  EVENING FOR CHOLESTEROL) 90 tablet 3  . brimonidine (ALPHAGAN) 0.2 % ophthalmic solution Place 1 drop into the left eye 2 (two) times daily.  3  . FLUoxetine (PROZAC) 10 MG capsule TAKE 1 CAPSULE (10 MG TOTAL) BY MOUTH DAILY. FOR ANXIETY AND DEPRESSION 90 capsule 1  . fluticasone (FLONASE) 50 MCG/ACT nasal spray Place 1 spray into both nostrils daily. 16 g 2  . furosemide (LASIX) 40 MG tablet TAKE 1 TABLET BY MOUTH EVERY DAY 90 tablet 2  . levothyroxine (SYNTHROID, LEVOTHROID) 50 MCG tablet Take 1 tablet (50 mcg total) by mouth daily before breakfast. 90 tablet 1  . losartan (COZAAR) 100 MG tablet TAKE 1 TABLET BY MOUTH EVERY DAY IN THE MORNING 90 tablet 1  . montelukast (SINGULAIR) 10 MG tablet Take 1 tablet (10 mg total) by mouth at bedtime. 30 tablet 3  . oxybutynin (DITROPAN-XL) 5 MG 24 hr tablet TAKE 1 TABLET (5 MG TOTAL) BY MOUTH AT BEDTIME. FOR URINARY INCONTINENCE. 90 tablet 3  . WIXELA INHUB 250-50 MCG/DOSE AEPB INHALE 1 PUFF INTO THE LUNGS 2 (TWO) TIMES DAILY. FOR COPD (Patient taking differently: Inhale 1 puff into the lungs 2 (two) times daily. ) 180 each 3   No current facility-administered medications on file prior to visit.     BP 124/72   Pulse 76   Temp 98.2 F (36.8 C) (Oral)   Ht 5' 7.5" (1.715 m)   Wt 160 lb 12 oz (72.9 kg)   SpO2 97%   BMI 24.81 kg/m    Objective:   Physical Exam  Constitutional: She appears well-nourished.  Neck: Neck supple.  Cardiovascular: Normal rate and regular rhythm.  Respiratory: Effort normal and breath sounds normal.  Musculoskeletal: Normal range of motion.  Neurological: She is alert.  Follows commands, does tend to talk in tangents when asked questions  Skin: Skin is warm and dry.  Psychiatric: She has a normal mood and affect.           Assessment &  Plan:

## 2018-12-27 NOTE — Assessment & Plan Note (Signed)
Following with cardiology, asymptomatic. LDL at goal from labs in August 2019. Continue statin.

## 2018-12-27 NOTE — Telephone Encounter (Signed)
fmla paperwork in [employer] in box  [name] (daughter) would like to have  2 times a month  Duration of 8 hours.  She also stated pt has 4 dr and dentisit that she sees every 6 months that she takes to appointments

## 2018-12-27 NOTE — Assessment & Plan Note (Signed)
Seems to be doing well overall on fluoxetine, continue same.

## 2018-12-27 NOTE — Patient Instructions (Addendum)
Follow up with pulmonology as scheduled, update me on the decision for inhaler choice. Continue Singulair daily for allergies.  Make sure to drink plenty of water, limit diet soda when possible.   Start exercising. You should be getting 150 minutes of exercise weekly.  Please schedule a physical with me in 6 months. You may also schedule a lab only appointment 3-4 days prior. We will discuss your lab results in detail during your physical.  It was a pleasure to see you today!

## 2018-12-27 NOTE — Assessment & Plan Note (Signed)
Significant improvement since surgery.

## 2018-12-27 NOTE — Assessment & Plan Note (Signed)
Stable in the office today, continue current regimen. 

## 2018-12-27 NOTE — Assessment & Plan Note (Signed)
Continued decline. Long discussion today regarding importance of accepting assistance, encouraging activity. Following with neurology, continue current regimen.

## 2018-12-27 NOTE — Assessment & Plan Note (Signed)
No pacer dependant. Last remote check from January 2020 reviewed.

## 2018-12-27 NOTE — Assessment & Plan Note (Signed)
Pacemaker in place. Recent remote check reviewed from January 2020. Following with cardiology.

## 2018-12-27 NOTE — Assessment & Plan Note (Signed)
LDL at goal on prior labs. Continue statin.

## 2018-12-27 NOTE — Assessment & Plan Note (Signed)
Overall stable, improved on prior labs. Encouraged water consumption, less soda.  Continue to monitor.

## 2018-12-28 ENCOUNTER — Telehealth: Payer: Self-pay | Admitting: Primary Care

## 2018-12-28 NOTE — Telephone Encounter (Signed)
Completed and placed on [facility] desk. 

## 2018-12-29 NOTE — Telephone Encounter (Signed)
Johny Drilling, can you find this to add to her list? I could not find when searching.

## 2019-01-01 NOTE — Telephone Encounter (Signed)
Daughter aware paperwork was faxed Copy for pt Copy for scan

## 2019-01-01 NOTE — Telephone Encounter (Signed)
Noted and added to patient's current med list

## 2019-01-22 ENCOUNTER — Other Ambulatory Visit: Payer: Self-pay | Admitting: Family Medicine

## 2019-01-22 DIAGNOSIS — R05 Cough: Secondary | ICD-10-CM

## 2019-01-22 DIAGNOSIS — R059 Cough, unspecified: Secondary | ICD-10-CM

## 2019-01-22 DIAGNOSIS — J449 Chronic obstructive pulmonary disease, unspecified: Secondary | ICD-10-CM

## 2019-01-22 NOTE — Telephone Encounter (Signed)
Spoke with [name], patient's daughter, patient is taking Singulair instead of Zyrtec. This was switched by Dr. Selena Batten in January when patient was seen for acute visit.

## 2019-01-29 ENCOUNTER — Other Ambulatory Visit: Payer: Self-pay

## 2019-01-29 ENCOUNTER — Encounter: Payer: Medicare Other | Admitting: *Deleted

## 2019-01-29 ENCOUNTER — Telehealth: Payer: Self-pay

## 2019-01-29 NOTE — Telephone Encounter (Signed)
Left message for patient to remind of missed remote transmission.  

## 2019-02-01 ENCOUNTER — Ambulatory Visit (INDEPENDENT_AMBULATORY_CARE_PROVIDER_SITE_OTHER): Payer: Medicare Other | Admitting: *Deleted

## 2019-02-01 ENCOUNTER — Other Ambulatory Visit: Payer: Self-pay

## 2019-02-01 DIAGNOSIS — I441 Atrioventricular block, second degree: Secondary | ICD-10-CM

## 2019-02-01 LAB — CUP PACEART REMOTE DEVICE CHECK
Battery Impedance: 180 Ohm
Battery Remaining Longevity: 154 mo
Battery Voltage: 2.78 V
Brady Statistic AP VP Percent: 0 %
Brady Statistic AP VS Percent: 20 %
Brady Statistic AS VP Percent: 0 %
Brady Statistic AS VS Percent: 80 %
Date Time Interrogation Session: 20200409122138
Implantable Lead Implant Date: 20160112
Implantable Lead Implant Date: 20160112
Implantable Lead Location: 753859
Implantable Lead Location: 753860
Implantable Lead Model: 5076
Implantable Lead Model: 5076
Implantable Pulse Generator Implant Date: 20160112
Lead Channel Impedance Value: 507 Ohm
Lead Channel Impedance Value: 539 Ohm
Lead Channel Pacing Threshold Amplitude: 0.625 V
Lead Channel Pacing Threshold Amplitude: 0.875 V
Lead Channel Pacing Threshold Pulse Width: 0.4 ms
Lead Channel Pacing Threshold Pulse Width: 0.4 ms
Lead Channel Sensing Intrinsic Amplitude: 2.8 mV
Lead Channel Sensing Intrinsic Amplitude: 5.6 mV
Lead Channel Setting Pacing Amplitude: 1.5 V
Lead Channel Setting Pacing Amplitude: 2 V
Lead Channel Setting Pacing Pulse Width: 0.4 ms
Lead Channel Setting Sensing Sensitivity: 2 mV

## 2019-02-05 ENCOUNTER — Other Ambulatory Visit: Payer: Self-pay | Admitting: Primary Care

## 2019-02-09 NOTE — Progress Notes (Signed)
Remote pacemaker transmission.   

## 2019-02-20 ENCOUNTER — Telehealth: Payer: Self-pay | Admitting: Cardiovascular Disease

## 2019-02-21 ENCOUNTER — Encounter: Payer: Self-pay | Admitting: Cardiovascular Disease

## 2019-02-21 ENCOUNTER — Other Ambulatory Visit: Payer: Self-pay

## 2019-02-21 ENCOUNTER — Telehealth (INDEPENDENT_AMBULATORY_CARE_PROVIDER_SITE_OTHER): Payer: Medicare Other | Admitting: Cardiovascular Disease

## 2019-02-21 VITALS — BP 122/58 | HR 63 | Wt 157.5 lb

## 2019-02-21 DIAGNOSIS — I471 Supraventricular tachycardia: Secondary | ICD-10-CM

## 2019-02-21 DIAGNOSIS — Z952 Presence of prosthetic heart valve: Secondary | ICD-10-CM

## 2019-02-21 DIAGNOSIS — I2510 Atherosclerotic heart disease of native coronary artery without angina pectoris: Secondary | ICD-10-CM | POA: Diagnosis not present

## 2019-02-21 DIAGNOSIS — I714 Abdominal aortic aneurysm, without rupture, unspecified: Secondary | ICD-10-CM

## 2019-02-21 DIAGNOSIS — I10 Essential (primary) hypertension: Secondary | ICD-10-CM

## 2019-02-21 DIAGNOSIS — R0600 Dyspnea, unspecified: Secondary | ICD-10-CM

## 2019-02-21 DIAGNOSIS — E7800 Pure hypercholesterolemia, unspecified: Secondary | ICD-10-CM

## 2019-02-21 DIAGNOSIS — I441 Atrioventricular block, second degree: Secondary | ICD-10-CM

## 2019-02-21 DIAGNOSIS — Z950 Presence of cardiac pacemaker: Secondary | ICD-10-CM

## 2019-02-21 DIAGNOSIS — I341 Nonrheumatic mitral (valve) prolapse: Secondary | ICD-10-CM

## 2019-02-21 MED ORDER — FUROSEMIDE 40 MG PO TABS: 60.0000 mg | ORAL_TABLET | Freq: Every day | ORAL | 1 refills | Status: DC

## 2019-02-21 MED ORDER — POTASSIUM CHLORIDE ER 10 MEQ PO TBCR: 10.0000 meq | EXTENDED_RELEASE_TABLET | Freq: Every day | ORAL | 1 refills | Status: DC

## 2019-02-21 NOTE — Patient Instructions (Signed)
Medication Instructions:  Increase Furosemide to 60 mg daily.  START Potassium 10 mEq--1 tab daily.  If you need a refill on your cardiac medications before your next appointment, please call your pharmacy.   Testing/Procedures: Your physician has requested that you have an echocardiogram in July. Echocardiography is a painless test that uses sound waves to create images of your heart. It provides your doctor with information about the size and shape of your heart and how well your heart's chambers and valves are working. This procedure takes approximately one hour. There are no restrictions for this procedure.  Follow-Up: At [facility], you and your health needs are our priority.  As part of our continuing mission to provide you with exceptional heart care, we have created designated Provider Care Teams.  These Care Teams include your primary Cardiologist (physician) and Advanced Practice Providers (APPs -  Physician Assistants and Nurse Practitioners) who all work together to provide you with the care you need, when you need it. You will need a follow up appointment in 4 months (August).  Please call our office 2 months in advance to schedule this appointment.  You may see [name], MD or one of the following Advanced Practice Providers on your designated Care Team: [place], [place] . Micah Flesher, PA-C  Any Other Special Instructions Will Be Listed Below (If Applicable). Please call our office or send via MyChart--daily weight and symptoms in 2 weeks.

## 2019-02-23 NOTE — Progress Notes (Signed)
Virtual Visit via Video Note   This visit type was conducted due to national recommendations for restrictions regarding the COVID-19 Pandemic (e.g. social distancing) in an effort to limit this patient's exposure and mitigate transmission in our community.  Due to her co-morbid illnesses, this patient is at least at moderate risk for complications without adequate follow up.  This format is felt to be most appropriate for this patient at this time.  All issues noted in this document were discussed and addressed.  A limited physical exam was performed with this format.  Please refer to the patient's chart for her consent to telehealth for [facility].   The patient's daughter [name] participated in the visit  Date:  02/23/2019   ID:  CAYLOR HECKAMAN, DOB [birth year], MRN [id number]  Patient Location: Home Provider Location: Home  PCP:  Doreene Nest, NP  Cardiologist:  [name], MD  Electrophysiologist:  None   Evaluation Performed:  Follow-Up Visit  Chief Complaint:  CHF, AFib  History of Present Illness:    [name] is a 85 y.o. female with chronic heart failure with preserved left ventricular ejection fraction, history of aortic valve replacement with a biological prosthesis, mild mitral valve prolapse with mild mitral insufficiency, remote history of syncope due to Mobitz type II second-degree AV block with implantation of a dual-chamber permanent pacemaker ([employer], 2016), small AAA.  She is generally been doing well, but recently her daughter has noticed that she becomes tired and short of breath even with showering.  She can still walk at a slow pace and 2-3 times a week she will walk for 1/4 mile or a half a mile.  Her weight is 157 pounds.  Several pounds higher than before.  In April 2018 she had an echocardiogram that showed pseudo-normal mitral inflow and at that point she weighed 151 pounds.  She has an adjustable bed and sleeps with the head of the bed  typically up at about 30 degrees elevation.  She has not had angina, palpitations, dizziness, syncope, leg edema or focal neurological complaints.  She denies any bleeding problems.  Comprehensive pacemaker interrogation on April 9 shows normal findings.  She is not pacemaker dependent.  She has a very low burden of atrial fibrillation at less than 0.1% although she has had episodes lasting for a few hours.  Atrial fibrillation is generally associated with good ventricular rate control, but she has an occasional burst of paroxysmal atrial tachycardia with 1: 1 AV conduction with rates as fast as 180 bpm.  Lead parameters are excellent.  The generator longevity is estimated at 13 years.  Despite the previous problems with second-degree AV block Mobitz type II she only requires ventricular pacing less than 0.3% of the time.  The patient does not have symptoms concerning for COVID-19 infection (fever, chills, cough, or new shortness of breath).    Past Medical History:  Diagnosis Date   AAA (abdominal aortic aneurysm) (HCC) 01/03/13   3.5x3.3cm   Alzheimer's dementia (HCC)    "probably middle stage" (11/05/2014)   Anemia    "hx of chronic" (11/05/2014)   Aortic stenosis 03/29/2008   biological prosthetic replacement   Arthritis    "joints" (11/05/2014)   Asthma    Chronic asthmatic bronchitis (HCC)    "q fall and winter" (11/05/2014)   Coronary artery disease    CABG 03/29/08   Dyslipidemia    Exertional shortness of breath    Family history of adverse reaction to  anesthesia    "daughter gets PONV too"   Heart murmur    Hypertension    Hypothyroidism    LBBB (left bundle branch block)    Migraines    "none in years' (11/05/2014)   Mobitz type 2 second degree AV block 10/31/2014   MVP (mitral valve prolapse)    with mild mitral insufficiency/notes 08/17/2013   PONV (postoperative nausea and vomiting)    Presence of permanent cardiac pacemaker    Sinus pause 10/31/2014    Stroke (HCC) 03/2008   "2 light strokes"; denies residual on 11/05/2014   Urinary frequency    Past Surgical History:  Procedure Laterality Date   ABDOMINAL HYSTERECTOMY  1977   AORTIC VALVE REPLACEMENT  03/29/2008   PERICARDIAL TISSUE VALVE   APPENDECTOMY  ?1977   CARDIAC CATHETERIZATION  ~ 2009   CATARACT EXTRACTION W/ INTRAOCULAR LENS IMPLANT Left    CORONARY ARTERY BYPASS GRAFT  03/29/2008   LIMA TO LAD,SVG TO CX,SVG TO LEFT POSTEROLATERAL BRANCH   HIP FRACTURE SURGERY Right 06/2010   "3 metal screws"    INSERT / REPLACE / REMOVE PACEMAKER  11/05/2014   LOOP RECORDER EXPLANT N/A 11/05/2014   Procedure: LOOP RECORDER EXPLANT;  Surgeon: [name], MD;  Location: [facility];  Service: Cardiovascular;  Laterality: N/A;   LOOP RECORDER IMPLANT N/A 09/17/2013   Procedure: LOOP RECORDER IMPLANT;  Surgeon: [name], MD;  Location: [facility];  Service: Cardiovascular;  Laterality: N/A;   PERMANENT PACEMAKER INSERTION N/A 11/05/2014   Procedure: PERMANENT PACEMAKER INSERTION;  Surgeon: [name], MD;  Location: [facility];  Service: Cardiovascular;  Laterality: N/A;   REFRACTIVE SURGERY Bilateral    /notes 04/28/2008 (08/17/2013)   RETINAL DETACHMENT SURGERY Left 1994   [name] 04/10/2008 (08/17/2013)   TIBIAL TUBERCLERPLASTY  11/05/2014   tooth pulled   06/2018   TOTAL HIP ARTHROPLASTY Right 07/31/2018   Procedure: RIGHT TOTAL HIP ARTHROPLASTY ANTERIOR APPROACH;  Surgeon: Gean Birchwood, MD;  Location: [facility];  Service: Orthopedics;  Laterality: Right;   UTERINE FIBROID SURGERY  1960's     Current Meds  Medication Sig   alendronate (FOSAMAX) 70 MG tablet TAKE 1 TAB BY MOUTH ONCE A WEEK. ON SUNDAYS, TAKE WITH A FULL GLASS OF WATER ON AN EMPTY STOMACH.   amLODipine (NORVASC) 10 MG tablet TAKE 1 TABLET BY MOUTH EVERY DAY   amoxicillin (AMOXIL) 500 MG tablet Take 2,000 mg by mouth See admin instructions. Take 2,000mg  by mouth 1 hour prior to dental appointments    aspirin EC 81 MG tablet Take 1 tablet (81 mg total) by mouth 2 (two) times daily.   atorvastatin (LIPITOR) 40 MG tablet TAKE 1 TABLET BY MOUTH EVERY DAY IN THE EVENING FOR CHOLESTEROL (Patient taking differently: Take 40 mg by mouth every evening. TAKE 1 TABLET BY MOUTH EVERY DAY IN THE EVENING FOR CHOLESTEROL)   brimonidine (ALPHAGAN) 0.2 % ophthalmic solution Place 1 drop into the left eye 2 (two) times daily.   cetirizine (ZYRTEC) 10 MG chewable tablet Chew 10 mg by mouth daily.   FLUoxetine (PROZAC) 10 MG capsule TAKE 1 CAPSULE (10 MG TOTAL) BY MOUTH DAILY. FOR ANXIETY AND DEPRESSION   furosemide (LASIX) 40 MG tablet Take 1.5 tablets (60 mg total) by mouth daily.   levothyroxine (SYNTHROID, LEVOTHROID) 50 MCG tablet Take 1 tablet (50 mcg total) by mouth daily before breakfast.   losartan (COZAAR) 100 MG tablet TAKE 1 TABLET BY MOUTH EVERY DAY IN THE MORNING  Memantine HCl-Donepezil HCl 21-10 MG CP24 Take 1 tablet by mouth daily.    montelukast (SINGULAIR) 10 MG tablet TAKE 1 TABLET BY MOUTH EVERYDAY AT BEDTIME   NAMZARIC 28-10 MG CP24 Take 1 capsule by mouth daily.   oxybutynin (DITROPAN-XL) 5 MG 24 hr tablet TAKE 1 TABLET (5 MG TOTAL) BY MOUTH AT BEDTIME. FOR URINARY INCONTINENCE.   WIXELA INHUB 250-50 MCG/DOSE AEPB INHALE 1 PUFF INTO THE LUNGS 2 (TWO) TIMES DAILY. FOR COPD (Patient taking differently: Inhale 1 puff into the lungs 2 (two) times daily. )   [DISCONTINUED] furosemide (LASIX) 40 MG tablet TAKE 1 TABLET BY MOUTH EVERY DAY     Allergies:   Codeine and Morphine and related   Social History   Tobacco Use   Smoking status: Never Smoker   Smokeless tobacco: Never Used  Substance Use Topics   Alcohol use: No    Alcohol/week: 0.0 standard drinks   Drug use: No     Family Hx: The patient's family history includes Diabetes in her mother; Heart failure in her mother; Hyperlipidemia in her mother; Hypertension in her mother; Melanoma in her grandchild. There is  no history of Colon cancer.  ROS:   Please see the history of present illness.    All other systems reviewed and are negative.   Prior CV studies:   The following studies were reviewed today: Comprehensive pacemaker download January 30 2019.  Echo 2018  Labs/Other Tests and Data Reviewed:    EKG:  An ECG dated February 01, 2018 was personally reviewed today and demonstrated:  Sinus rhythm with a long PR interval, right bundle branch block, left anterior fascicular block no ischemic changes  Recent Labs: 06/20/2018: ALT 14; TSH 1.82 08/02/2018: Hemoglobin 7.8; Platelets 140 09/13/2018: BUN 19; Creatinine, Ser 1.08; Potassium 3.5; Sodium 144   Recent Lipid Panel Lab Results  Component Value Date/Time   CHOL 137 06/20/2018 01:33 PM   TRIG 84.0 06/20/2018 01:33 PM   HDL 60.40 06/20/2018 01:33 PM   CHOLHDL 2 06/20/2018 01:33 PM   LDLCALC 60 06/20/2018 01:33 PM    Wt Readings from Last 3 Encounters:  02/21/19 157 lb 8 oz (71.4 kg)  12/27/18 160 lb 12 oz (72.9 kg)  12/06/18 161 lb (73 kg)     Objective:    Vital Signs:  BP (!) 122/58 (BP Location: Right Arm)    Pulse 63    Wt 157 lb 8 oz (71.4 kg)    SpO2 98%    BMI 24.30 kg/m    VITAL SIGNS:  reviewed GEN:  no acute distress EYES:  sclerae anicteric, EOMI - Extraocular Movements Intact RESPIRATORY:  normal respiratory effort, symmetric expansion CARDIOVASCULAR:  no peripheral edema SKIN:  no rash, lesions or ulcers. MUSCULOSKELETAL:  no obvious deformities. NEURO:  alert and oriented x 3, no obvious focal deficit PSYCH:  normal affect  ASSESSMENT & PLAN:    1. CHF: She has shortness of breath with minimal activity and possibly some orthopnea.  She has gained a few pounds which may represent fluid.  Will increase her furosemide slightly to 60 mg daily.  We will repeat her echocardiogram when the coronavirus-related restrictions are loosened. 2. Hx AVR: Aortic valve prosthesis showed normal gradients when last evaluated by echo  in 2018. 3. CAD s/p CABG: Denies angina pectoris. 4. MVP/MR: Without physical exam is impossible to say whether there is any sign of worsening mitral regurgitation that could explain her reduced exercise tolerance.  We will rely on  the echocardiogram. 5. AFib: The overall burden of arrhythmia is very low.  She is on appropriate anticoagulation and has good ventricular rate control.  While she does have tachycardia during episodes of regular paroxysmal atrial tachycardia, these are too brief to warrant any change in therapy. 6. PM: Normal device function. 7. AAA: Relatively small with a maximum diameter of 3.7 cm, last evaluated by ultrasound in September 2018. 8. PAD: Denies intermittent claudication did have some mid aortic stenosis as well as a 50% stenosis in both right and left common iliac arteries. 9. HLP: LDL at target on statin.  COVID-19 Education: The signs and symptoms of COVID-19 were discussed with the patient and how to seek care for testing (follow up with PCP or arrange E-visit).  The importance of social distancing was discussed today.  Time:   Today, I have spent 26 minutes with the patient with telehealth technology discussing the above problems.     Medication Adjustments/Labs and Tests Ordered: Current medicines are reviewed at length with the patient today.  Concerns regarding medicines are outlined above.   Tests Ordered: Orders Placed This Encounter  Procedures   ECHOCARDIOGRAM COMPLETE    Medication Changes: Meds ordered this encounter  Medications   furosemide (LASIX) 40 MG tablet    Sig: Take 1.5 tablets (60 mg total) by mouth daily.    Dispense:  135 tablet    Refill:  1   potassium chloride (K-DUR) 10 MEQ tablet    Sig: Take 1 tablet (10 mEq total) by mouth daily.    Dispense:  90 tablet    Refill:  1    Disposition:  Follow up 3 months  Signed, Thurmon FairMihai Anieya Helman, MD  02/23/2019 4:05 PM    [facility]

## 2019-03-03 ENCOUNTER — Other Ambulatory Visit: Payer: Self-pay | Admitting: Primary Care

## 2019-03-03 DIAGNOSIS — E039 Hypothyroidism, unspecified: Secondary | ICD-10-CM

## 2019-03-28 ENCOUNTER — Ambulatory Visit: Payer: Medicare Other | Admitting: Pulmonary Disease

## 2019-03-28 ENCOUNTER — Ambulatory Visit: Payer: Medicare Other | Admitting: Neurology

## 2019-03-30 NOTE — Telephone Encounter (Signed)
From [name] To Croitoru, Rachelle Hora, MD Sent 03/29/2019 7:18 PM  When she takes her daily walks it tires her out and she breathes a little heavy. Other than that her breathing seems okay. She has had a cough for about a month. You can hear congestion in it. Usually a cough drop or a sip of water releaves the cough. She has an appointment with Jalene Mullet from Dr. Evlyn Courier office on July 8.

## 2019-04-01 ENCOUNTER — Other Ambulatory Visit: Payer: Self-pay | Admitting: Primary Care

## 2019-04-01 DIAGNOSIS — M8580 Other specified disorders of bone density and structure, unspecified site: Secondary | ICD-10-CM

## 2019-04-10 ENCOUNTER — Telehealth: Payer: Self-pay | Admitting: *Deleted

## 2019-04-10 NOTE — Telephone Encounter (Signed)
The patient's daughter sent a message to MyChart inquiring if the patient should still be on potassium 20 mEq. When asked how the patient is doing on the 80 mg of lasix, the daughter replied:  She appears to be doing fine, and the breathing appears to be better.  Weight is about the same, at 152 to 156.  According to the message on 03/08/2019: Hello [name], Please go ahead and increase the Lasix to 80 mg daily ( 2 tablets) also increase her potassium chloride to 2 tablets (a total of 20 mEq ) daily and let me know how her weight and symptoms are doing in another week. Stay safe! Dr. Loletha Grayer  The patient is currently taking Furosemide 80 mg daily and Potassium 20 mEq once daily. The daughter would like to know if she should stay on this dosage or go back to what she was taking.

## 2019-04-13 NOTE — Telephone Encounter (Signed)
Please continue the KCl supplement

## 2019-04-15 ENCOUNTER — Other Ambulatory Visit: Payer: Self-pay | Admitting: *Deleted

## 2019-04-15 MED ORDER — POTASSIUM CHLORIDE ER 10 MEQ PO TBCR: 20.0000 meq | EXTENDED_RELEASE_TABLET | Freq: Every day | ORAL | 1 refills | Status: DC

## 2019-04-15 NOTE — Telephone Encounter (Signed)
Message sent through MyChart with Dr. Victorino December response. Potassium 20 mEq has been refilled.

## 2019-04-23 NOTE — Telephone Encounter (Signed)
Opened in error

## 2019-04-29 ENCOUNTER — Other Ambulatory Visit: Payer: Self-pay | Admitting: Neurology

## 2019-05-01 NOTE — Progress Notes (Signed)
 @Patient  ID: Kathryn Caffeyosalie S Feger, female    DOB: 05/30/1934, 85 y.o.   MRN: [id number]  Chief Complaint  Patient presents with  . Follow-up    Asthma 37mo     Referring provider: [name], [name], NP  HPI:  85 year old female never smoker followed in our office for asthma  PMH: AAA, hyperlipidemia, osteoporosis, altered gait, at risk for falls, chronic kidney disease Smoker/ Smoking History: Never smoker Maintenance:  Wixela  Pt of: Dr. Craige CottaSood  05/02/2019  - Visit   85 year old female never smoker presenting to office today as a four-month follow-up visit.  Patient is maintained on Wixela.  Patient reports that breathing has been relatively stable but has had some increased shortness of breath with physical exertion.  Patient unfortunately has fallen 2 times over the last week.  Patient is working with cardiology as well as with primary care regarding this as blood pressure has been running low.  Blood pressure today is 96/50.  Patient has a planned echocardiogram today with cardiology.  Overall patient reporting that breathing has been stable she has not needed to use her rescue inhaler.  Patient has fallen 2 times earlier in the morning when she is gotten up quickly to go to the bathroom.  Patient is following up with cardiology later on today and has been also following up with primary care regarding this.  Patient's daughter who is present with patient today reports that patient's breathing has been much better since starting a daily antihistamine as well as regulating her medications to ensure she is using as prescribed.  Patient's daughter confirms patient has been using inhalers as recommended.  She has not needed to use her rescue inhaler.   Tests:   Pulmonary tests:  Spirometry 05/26/16 >> FEV1 1.84 (87%), FEV1% 63 PFT 12/06/18 >> FEV1 2.29 (102%), FEV1% 78, TLC 6.29 (111%), DLCO 75%, no BD  Cardiac tests:  Echo 01/26/17 >> EF 60 to 65%, grade 2 DD, bioprosthetic AV  FENO:  No  results found for: NITRICOXIDE  PFT: PFT Results Latest Ref Rng & Units 12/06/2018  FVC-Pre L 2.93  FVC-Predicted Pre % 97  FVC-Post L 2.94  FVC-Predicted Post % 98  Pre FEV1/FVC % % 78  Post FEV1/FCV % % 77  FEV1-Pre L 2.29  FEV1-Predicted Pre % 102  FEV1-Post L 2.27  DLCO UNC% % 75  DLCO COR %Predicted % 96  TLC L 6.29  TLC % Predicted % 111  RV % Predicted % 133    Imaging: No results found.    Specialty Problems      Pulmonary Problems   Mild persistent asthma      Allergies  Allergen Reactions  . Codeine Nausea And Vomiting and Other (See Comments)    Patient gets violently ill  . Morphine And Related Nausea And Vomiting and Other (See Comments)    Patient get violently ill    Immunization History  Administered Date(s) Administered  . Influenza,inj,Quad PF,6+ Mos 08/18/2013  . Influenza-Unspecified 07/25/2014, 08/11/2018  . Pneumococcal Conjugate-13 05/14/2014  . Pneumococcal Polysaccharide-23 08/04/2010  . Tdap 02/19/2011  . Zoster 12/15/2006    Past Medical History:  Diagnosis Date  . AAA (abdominal aortic aneurysm) (HCC) 01/03/13   3.5x3.3cm  . Alzheimer's dementia (HCC)    "probably middle stage" (11/05/2014)  . Anemia    "hx of chronic" (11/05/2014)  . Aortic stenosis 03/29/2008   biological prosthetic replacement  . Arthritis    "joints" (11/05/2014)  . Asthma   .  Chronic asthmatic bronchitis (HCC)    "q fall and winter" (11/05/2014)  . Coronary artery disease    CABG 03/29/08  . Dyslipidemia   . Exertional shortness of breath   . Family history of adverse reaction to anesthesia    "daughter gets PONV too"  . Heart murmur   . Hypertension   . Hypothyroidism   . LBBB (left bundle branch block)   . Migraines    "none in years' (11/05/2014)  . Mobitz type 2 second degree AV block 10/31/2014  . MVP (mitral valve prolapse)    with mild mitral insufficiency/notes 08/17/2013  . PONV (postoperative nausea and vomiting)   . Presence of permanent  cardiac pacemaker   . Sinus pause 10/31/2014  . Stroke [facility](HCC) 03/2008   "2 light strokes"; denies residual on 11/05/2014  . Urinary frequency     Tobacco History: Social History   Tobacco Use  Smoking Status Never Smoker  Smokeless Tobacco Never Used   Counseling given: Yes   Continue to not smoke  Outpatient Encounter Medications as of 05/02/2019  Medication Sig  . alendronate (FOSAMAX) 70 MG tablet TAKE 1 TAB BY MOUTH ONCE A WEEK. ON SUNDAYS, TAKE WITH A FULL GLASS OF WATER ON AN EMPTY STOMACH.  Marland Kitchen. amLODipine (NORVASC) 10 MG tablet TAKE 1 TABLET BY MOUTH EVERY DAY  . amoxicillin (AMOXIL) 500 MG tablet Take 2,000 mg by mouth See admin instructions. Take 2,000mg  by mouth 1 hour prior to dental appointments  . aspirin EC 81 MG tablet Take 1 tablet (81 mg total) by mouth 2 (two) times daily.  Marland Kitchen. atorvastatin (LIPITOR) 40 MG tablet TAKE 1 TABLET BY MOUTH EVERY DAY IN THE EVENING FOR CHOLESTEROL (Patient taking differently: Take 40 mg by mouth every evening. TAKE 1 TABLET BY MOUTH EVERY DAY IN THE EVENING FOR CHOLESTEROL)  . brimonidine (ALPHAGAN) 0.2 % ophthalmic solution Place 1 drop into the left eye 2 (two) times daily.  . cetirizine (ZYRTEC) 10 MG chewable tablet Chew 10 mg by mouth daily.  Marland Kitchen. FLUoxetine (PROZAC) 10 MG capsule TAKE 1 CAPSULE (10 MG TOTAL) BY MOUTH DAILY. FOR ANXIETY AND DEPRESSION  . furosemide (LASIX) 40 MG tablet Take 1.5 tablets (60 mg total) by mouth daily.  Marland Kitchen. levothyroxine (SYNTHROID) 50 MCG tablet TAKE 1 TABLET BY MOUTH DAILY BEFORE BREAKFAST  . losartan (COZAAR) 100 MG tablet TAKE 1 TABLET BY MOUTH EVERY DAY IN THE MORNING  . montelukast (SINGULAIR) 10 MG tablet TAKE 1 TABLET BY MOUTH EVERYDAY AT BEDTIME  . NAMZARIC 28-10 MG CP24 Take 1 capsule by mouth daily.  Marland Kitchen. oxybutynin (DITROPAN-XL) 5 MG 24 hr tablet TAKE 1 TABLET (5 MG TOTAL) BY MOUTH AT BEDTIME. FOR URINARY INCONTINENCE.  Marland Kitchen. potassium chloride (K-DUR) 10 MEQ tablet Take 2 tablets (20 mEq total) by mouth daily.   Monte Fantasia. WIXELA INHUB 250-50 MCG/DOSE AEPB INHALE 1 PUFF INTO THE LUNGS 2 (TWO) TIMES DAILY. FOR COPD (Patient taking differently: Inhale 1 puff into the lungs 2 (two) times daily. )  . fluticasone (FLONASE) 50 MCG/ACT nasal spray Place 1 spray into both nostrils daily. (Patient not taking: Reported on 02/21/2019)  . [DISCONTINUED] Memantine HCl-Donepezil HCl 21-10 MG CP24 Take 1 tablet by mouth daily.    No facility-administered encounter medications on file as of 05/02/2019.      Review of Systems  Review of Systems  Constitutional: Negative for chills, fatigue, fever and unexpected weight change.  HENT: Negative for congestion, ear pain, postnasal drip, sinus pressure and sinus pain.  Respiratory: Positive for shortness of breath (with exertion ). Negative for cough, chest tightness and wheezing.   Cardiovascular: Negative for chest pain and palpitations.  Gastrointestinal: Negative for diarrhea, nausea and vomiting.  Musculoskeletal: Positive for gait problem (History of falls, at risk). Negative for arthralgias.  Skin: Negative for color change.  Allergic/Immunologic: Negative for environmental allergies and food allergies.  Neurological: Positive for dizziness (? orthostatic hypotension). Negative for light-headedness and headaches.  Psychiatric/Behavioral: Negative for dysphoric mood. The patient is not nervous/anxious.   All other systems reviewed and are negative.    Physical Exam  BP (!) 96/50 (BP Location: Left Arm, Cuff Size: Normal)   Pulse 67   Temp 97.8 F (36.6 C) (Oral)   Ht 5\' 9"  (1.753 m)   Wt 159 lb (72.1 kg)   SpO2 97%   BMI 23.48 kg/m   Wt Readings from Last 5 Encounters:  05/02/19 159 lb (72.1 kg)  02/21/19 157 lb 8 oz (71.4 kg)  12/27/18 160 lb 12 oz (72.9 kg)  12/06/18 161 lb (73 kg)  11/15/18 159 lb 6.4 oz (72.3 kg)    Physical Exam  Constitutional: She is oriented to person, place, and time and well-developed, well-nourished, and in no distress. No  distress.  Elderly female  HENT:  Head: Normocephalic and atraumatic.  Right Ear: Hearing, tympanic membrane, external ear and ear canal normal.  Left Ear: Hearing, tympanic membrane, external ear and ear canal normal.  Nose: Nose normal.  Mouth/Throat: Uvula is midline and oropharynx is clear and moist. No oropharyngeal exudate.  Eyes: Pupils are equal, round, and reactive to light.  Neck: Normal range of motion. Neck supple.  Cardiovascular: Normal rate, regular rhythm and normal heart sounds.  Pulmonary/Chest: Effort normal and breath sounds normal. No accessory muscle usage. No respiratory distress. She has no decreased breath sounds. She has no wheezes. She has no rhonchi. She has no rales.  Musculoskeletal: Normal range of motion.        General: No edema.  Lymphadenopathy:    She has no cervical adenopathy.  Neurological: She is alert and oriented to person, place, and time. Gait normal.  Skin: Skin is warm and dry. She is not diaphoretic. No erythema.  Psychiatric: Mood, memory and judgment normal. She has a flat affect.  Nursing note and vitals reviewed.     Lab Results:  CBC    Component Value Date/Time   WBC 9.9 08/02/2018 0447   RBC 2.52 (L) 08/02/2018 0447   HGB 7.8 (L) 08/02/2018 0447   HCT 24.3 (L) 08/02/2018 0447   PLT 140 (L) 08/02/2018 0447   MCV 96.4 08/02/2018 0447   MCH 31.0 08/02/2018 0447   MCHC 32.1 08/02/2018 0447   RDW 13.7 08/02/2018 0447   LYMPHSABS 0.6 (L) 07/26/2018 0957   MONOABS 0.5 07/26/2018 0957   EOSABS 0.1 07/26/2018 0957   BASOSABS 0.0 07/26/2018 0957    BMET    Component Value Date/Time   NA 144 09/13/2018 0913   K 3.5 09/13/2018 0913   CL 103 09/13/2018 0913   CO2 33 (H) 09/13/2018 0913   GLUCOSE 106 (H) 09/13/2018 0913   BUN 19 09/13/2018 0913   CREATININE 1.08 09/13/2018 0913   CREATININE 0.96 (H) 11/03/2017 1536   CALCIUM 8.8 09/13/2018 0913   GFRNONAA 40 (L) 08/01/2018 0456   GFRAA 47 (L) 08/01/2018 0456    BNP No  results found for: BNP  ProBNP    Component Value Date/Time   PROBNP 456.3 (H)  08/17/2013 2115      Assessment & Plan:   Mild persistent asthma Plan: Continue Wixella  Continue rescue inhaler as needed Follow-up with our office in 4 months  If patient still has stable breathing 4 months from now and is continuing to not need rescue inhaler then would recommend switching from ICS/LABA to ICS.  Will not change today as patient has upcoming cardiology appointment with recurrent falls suspect patient's medications will be changed by cardiology to help limit confusion.  HTN (hypertension) Plan: Stable but lower blood pressure today in office Recent falls at home, suspect from orthostatic hypotension Following up with cardiology later on today with echocardiogram May need orthostatics or change to blood pressure medications, will defer to cardiology  Altered gait 2 recent falls, with dizziness with patient quickly getting up from bed to move to the bathroom in the morning  Plan: Complete follow-up with cardiology later on today    Return in about 4 months (around 09/02/2019), or if symptoms worsen or fail to improve, for Follow up with Dr. Halford Chessman.   Lauraine Rinne, NP 05/02/2019   This appointment was 26 minutes long with over 50% of the time in direct face-to-face patient care, assessment, plan of care, and follow-up.

## 2019-05-02 ENCOUNTER — Other Ambulatory Visit: Payer: Self-pay

## 2019-05-02 ENCOUNTER — Ambulatory Visit (HOSPITAL_COMMUNITY): Payer: Medicare Other | Attending: Cardiovascular Disease

## 2019-05-02 ENCOUNTER — Encounter: Payer: Self-pay | Admitting: Pulmonary Disease

## 2019-05-02 ENCOUNTER — Ambulatory Visit: Payer: Medicare Other | Admitting: Pulmonary Disease

## 2019-05-02 DIAGNOSIS — R0600 Dyspnea, unspecified: Secondary | ICD-10-CM | POA: Diagnosis not present

## 2019-05-02 DIAGNOSIS — I10 Essential (primary) hypertension: Secondary | ICD-10-CM

## 2019-05-02 DIAGNOSIS — Z952 Presence of prosthetic heart valve: Secondary | ICD-10-CM | POA: Insufficient documentation

## 2019-05-02 DIAGNOSIS — J4530 Mild persistent asthma, uncomplicated: Secondary | ICD-10-CM | POA: Diagnosis not present

## 2019-05-02 DIAGNOSIS — R269 Unspecified abnormalities of gait and mobility: Secondary | ICD-10-CM

## 2019-05-02 LAB — ECHOCARDIOGRAM COMPLETE
Height: 69 in
Weight: 2544 oz

## 2019-05-02 NOTE — Assessment & Plan Note (Signed)
Plan: Stable but lower blood pressure today in office Recent falls at home, suspect from orthostatic hypotension Following up with cardiology later on today with echocardiogram May need orthostatics or change to blood pressure medications, will defer to cardiology

## 2019-05-02 NOTE — Assessment & Plan Note (Signed)
2 recent falls, with dizziness with patient quickly getting up from bed to move to the bathroom in the morning  Plan: Complete follow-up with cardiology later on today

## 2019-05-02 NOTE — Patient Instructions (Addendum)
Keep follow-up with cardiology and complete echocardiogram today  Discussed with cardiology recent falls as well as potential orthostatic hypotension  Continue Wixella inhaler as prescribed   Only use your albuterol as a rescue medication to be used if you can't catch your breath by resting or doing a relaxed purse lip breathing pattern.  - The less you use it, the better it will work when you need it. - Ok to use up to 2 puffs  every 4 hours if you must but call for immediate appointment if use goes up over your usual need - Don't leave home without it !!  (think of it like the spare tire for your car)     If symptoms remain stable and you have another good 4 months with your breathing then we can consider decreasing your inhaler to a simple ICS inhaler.  We will not make these changes today as I suspect he will have medication changes from cardiology.    Return in about 4 months (around 09/02/2019), or if symptoms worsen or fail to improve, for Follow up with Dr. Craige Cotta.   Coronavirus (COVID-19) Are you at risk?  Are you at risk for the Coronavirus (COVID-19)?  To be considered HIGH RISK for Coronavirus (COVID-19), you have to meet the following criteria:  . Traveled to [place], [place], [place], [place] or [place]; or in the [place] to [place], [place], [place], or [place]; and have fever, cough, and shortness of breath within the last 2 weeks of travel OR . Been in close contact with a person diagnosed with COVID-19 within the last 2 weeks and have fever, cough, and shortness of breath . IF YOU DO NOT MEET THESE CRITERIA, YOU ARE CONSIDERED LOW RISK FOR COVID-19.  What to do if you are HIGH RISK for COVID-19?  Marland Kitchen If you are having a medical emergency, call 911. . Seek medical care right away. Before you go to a doctor's office, urgent care or emergency department, call ahead and tell them about your recent travel, contact with someone diagnosed with COVID-19, and your  symptoms. You should receive instructions from your physician's office regarding next steps of care.  . When you arrive at healthcare provider, tell the healthcare staff immediately you have returned from visiting [place], [place], [place], [place] or [place]; or traveled in the [place] to [place], [place], [place], or [place]; in the last two weeks or you have been in close contact with a person diagnosed with COVID-19 in the last 2 weeks.   . Tell the health care staff about your symptoms: fever, cough and shortness of breath. . After you have been seen by a medical provider, you will be either: o Tested for (COVID-19) and discharged home on quarantine except to seek medical care if symptoms worsen, and asked to  - Stay home and avoid contact with others until you get your results (4-5 days)  - Avoid travel on public transportation if possible (such as bus, train, or airplane) or o Sent to the Emergency Department by EMS for evaluation, COVID-19 testing, and possible admission depending on your condition and test results.  What to do if you are LOW RISK for COVID-19?  Reduce your risk of any infection by using the same precautions used for avoiding the common cold or flu:  Marland Kitchen Wash your hands often with soap and warm water for at least 20 seconds.  If soap and water are not readily available, use an  alcohol-based hand sanitizer with at least 60% alcohol.  . If coughing or sneezing, cover your mouth and nose by coughing or sneezing into the elbow areas of your shirt or coat, into a tissue or into your sleeve (not your hands). . Avoid shaking hands with others and consider head nods or verbal greetings only. . Avoid touching your eyes, nose, or mouth with unwashed hands.  . Avoid close contact with people who are sick. . Avoid places or events with large numbers of people in one location, like concerts or sporting events. . Carefully consider travel plans you have or are making. . If you  are planning any travel outside or inside the [place], visit the CDC's Travelers' Health webpage for the latest health notices. . If you have some symptoms but not all symptoms, continue to monitor at home and seek medical attention if your symptoms worsen. . If you are having a medical emergency, call 911.   [place] / e-Visit: [email or web address]         [facility]: [facility]: [phone]                   [facility]: [phone]           It is flu season:   >>> Best ways to protect herself from the flu: Receive the yearly flu vaccine, practice good hand hygiene washing with soap and also using hand sanitizer when available, eat a nutritious meals, get adequate rest, hydrate appropriately   Please contact the office if your symptoms worsen or you have concerns that you are not improving.   Thank you for choosing [facility] for your healthcare, and for allowing [place] to partner with you on your healthcare journey. I am thankful to be able to provide care to you today.   Wyn Quaker FNP-C

## 2019-05-02 NOTE — Assessment & Plan Note (Signed)
Plan: Continue Wixella  Continue rescue inhaler as needed Follow-up with our office in 4 months  If patient still has stable breathing 4 months from now and is continuing to not need rescue inhaler then would recommend switching from ICS/LABA to ICS.  Will not change today as patient has upcoming cardiology appointment with recurrent falls suspect patient's medications will be changed by cardiology to help limit confusion.

## 2019-05-03 ENCOUNTER — Telehealth: Payer: Self-pay | Admitting: *Deleted

## 2019-05-03 ENCOUNTER — Ambulatory Visit (INDEPENDENT_AMBULATORY_CARE_PROVIDER_SITE_OTHER): Payer: Medicare Other | Admitting: *Deleted

## 2019-05-03 DIAGNOSIS — I441 Atrioventricular block, second degree: Secondary | ICD-10-CM | POA: Diagnosis not present

## 2019-05-03 DIAGNOSIS — I10 Essential (primary) hypertension: Secondary | ICD-10-CM

## 2019-05-03 DIAGNOSIS — I2510 Atherosclerotic heart disease of native coronary artery without angina pectoris: Secondary | ICD-10-CM

## 2019-05-03 DIAGNOSIS — I471 Supraventricular tachycardia: Secondary | ICD-10-CM

## 2019-05-03 NOTE — Telephone Encounter (Signed)
Let's do furosemide 80 mg AM + 40 mg PM, make sure she stops the amlodipine and increases the KCl. Thanks

## 2019-05-03 NOTE — Telephone Encounter (Signed)
-----   Message from Sanda Klein, MD sent at 05/02/2019  5:18 PM EDT ----- Echo still shows excessive fluid on board which explains the shortness of breath. I saw in note from pulmonary visit today that BP is low and she has fallen.  Please stop the amlodipine, but increase the furosemide to 80 mg daily and the KCl to 20 mEq three times daily. Check BMET and BNP in 2 weeks, please. Will kepp appt in Sptember, but we can move it up sooner if this does not make her feel better.

## 2019-05-03 NOTE — Telephone Encounter (Signed)
Call placed to the patient's daughter, per dpr, to inform her of the results. Amlodipine has been discontinued and lab orders placed.   The patient is currently taking potassium 20 mEq daily and Furosemide 80 mg daily (the medication list was not updated). She has been taking the 80 mg daily of furosemide for a month and half now. The daughter stated that the patient does get winded easily but she has also been mainly sedentary. She stated that the nurse has a hard time convincing the patient to ambulate.

## 2019-05-04 ENCOUNTER — Telehealth: Payer: Self-pay

## 2019-05-04 MED ORDER — POTASSIUM CHLORIDE ER 10 MEQ PO TBCR: 20.0000 meq | EXTENDED_RELEASE_TABLET | Freq: Three times a day (TID) | ORAL | 1 refills | Status: DC

## 2019-05-04 MED ORDER — FUROSEMIDE 40 MG PO TABS: ORAL_TABLET | ORAL | 1 refills | Status: DC

## 2019-05-04 NOTE — Telephone Encounter (Signed)
The patient's daughter has been made aware to:  Stop the amlodipine Increase the Furosemide to 80 mg in the morning and 40 mg in the afternoon Increase the Potassium to 20 mEq three times daily.  If she has any questions she will send [place] a message through [facility].

## 2019-05-04 NOTE — Telephone Encounter (Signed)
Left message for patient to remind of missed remote transmission.  

## 2019-05-06 ENCOUNTER — Other Ambulatory Visit: Payer: Self-pay | Admitting: Primary Care

## 2019-05-07 LAB — CUP PACEART REMOTE DEVICE CHECK
Battery Impedance: 180 Ohm
Battery Remaining Longevity: 153 mo
Battery Voltage: 2.78 V
Brady Statistic AP VP Percent: 0 %
Brady Statistic AP VS Percent: 21 %
Brady Statistic AS VP Percent: 0 %
Brady Statistic AS VS Percent: 79 %
Date Time Interrogation Session: 20200712225216
Implantable Lead Implant Date: 20160112
Implantable Lead Implant Date: 20160112
Implantable Lead Location: 753859
Implantable Lead Location: 753860
Implantable Lead Model: 5076
Implantable Lead Model: 5076
Implantable Pulse Generator Implant Date: 20160112
Lead Channel Impedance Value: 487 Ohm
Lead Channel Impedance Value: 516 Ohm
Lead Channel Pacing Threshold Amplitude: 0.625 V
Lead Channel Pacing Threshold Amplitude: 0.875 V
Lead Channel Pacing Threshold Pulse Width: 0.4 ms
Lead Channel Pacing Threshold Pulse Width: 0.4 ms
Lead Channel Setting Pacing Amplitude: 1.5 V
Lead Channel Setting Pacing Amplitude: 2 V
Lead Channel Setting Pacing Pulse Width: 0.4 ms
Lead Channel Setting Sensing Sensitivity: 2 mV

## 2019-05-09 NOTE — Progress Notes (Signed)
Remote pacemaker transmission.   

## 2019-05-16 MED ORDER — LOSARTAN POTASSIUM 50 MG PO TABS: 100.0000 mg | ORAL_TABLET | Freq: Every day | ORAL | 0 refills | Status: DC

## 2019-05-16 NOTE — Telephone Encounter (Signed)
Spoken to patient's daughter. She stated that Losartan 100 mg on back order and [facility] do have 50 mg.  Per Anda Kraft okay to change.

## 2019-05-28 MED ORDER — POTASSIUM CHLORIDE ER 10 MEQ PO TBCR: 20.0000 meq | EXTENDED_RELEASE_TABLET | Freq: Three times a day (TID) | ORAL | 1 refills | Status: DC

## 2019-05-30 ENCOUNTER — Other Ambulatory Visit: Payer: Self-pay

## 2019-05-30 DIAGNOSIS — I2510 Atherosclerotic heart disease of native coronary artery without angina pectoris: Secondary | ICD-10-CM

## 2019-05-30 DIAGNOSIS — I10 Essential (primary) hypertension: Secondary | ICD-10-CM

## 2019-05-31 ENCOUNTER — Telehealth: Payer: Self-pay | Admitting: *Deleted

## 2019-05-31 DIAGNOSIS — I5032 Chronic diastolic (congestive) heart failure: Secondary | ICD-10-CM

## 2019-05-31 DIAGNOSIS — Z8639 Personal history of other endocrine, nutritional and metabolic disease: Secondary | ICD-10-CM

## 2019-05-31 LAB — BASIC METABOLIC PANEL
BUN/Creatinine Ratio: 22 (ref 12–28)
BUN: 26 mg/dL (ref 8–27)
CO2: 24 mmol/L (ref 20–29)
Calcium: 8.8 mg/dL (ref 8.7–10.3)
Chloride: 99 mmol/L (ref 96–106)
Creatinine, Ser: 1.2 mg/dL — ABNORMAL HIGH (ref 0.57–1.00)
GFR calc Af Amer: 49 mL/min/{1.73_m2} — ABNORMAL LOW (ref >59)
GFR calc non Af Amer: 42 mL/min/{1.73_m2} — ABNORMAL LOW (ref >59)
Glucose: 99 mg/dL (ref 65–99)
Potassium: 3.2 mmol/L — ABNORMAL LOW (ref 3.5–5.2)
Sodium: 142 mmol/L (ref 134–144)

## 2019-05-31 LAB — BRAIN NATRIURETIC PEPTIDE: BNP: 157.8 pg/mL — ABNORMAL HIGH (ref 0.0–100.0)

## 2019-05-31 NOTE — Telephone Encounter (Signed)
Left a message for the patient to call back.  

## 2019-05-31 NOTE — Telephone Encounter (Signed)
-----   Message from Sanda Klein, MD sent at 05/31/2019 11:08 AM EDT ----- Kidney function unchanged from average baseline of last 12 months, bu K is low. Please confirm she is taking KCl 20 mEq three times a day. (That's a total of 6 of the 10 mEq tabs every day). If yes, please add another 20 mEq every day. Recheck BMET in 2 weeks.

## 2019-06-01 ENCOUNTER — Encounter: Payer: Self-pay | Admitting: *Deleted

## 2019-06-01 MED ORDER — POTASSIUM CHLORIDE ER 10 MEQ PO TBCR: EXTENDED_RELEASE_TABLET | ORAL | 1 refills | Status: DC

## 2019-06-01 NOTE — Telephone Encounter (Signed)
Daughter is returning your call.

## 2019-06-01 NOTE — Telephone Encounter (Signed)
Patient's daughter has been made aware of results and verbalized understanding. Message also sent to MyChart

## 2019-06-03 ENCOUNTER — Other Ambulatory Visit: Payer: Self-pay | Admitting: Primary Care

## 2019-06-03 DIAGNOSIS — F3342 Major depressive disorder, recurrent, in full remission: Secondary | ICD-10-CM

## 2019-06-15 ENCOUNTER — Encounter: Payer: Self-pay | Admitting: *Deleted

## 2019-06-15 LAB — BASIC METABOLIC PANEL WITH GFR
BUN/Creatinine Ratio: 19 (ref 12–28)
BUN: 24 mg/dL (ref 8–27)
CO2: 27 mmol/L (ref 20–29)
Calcium: 9 mg/dL (ref 8.7–10.3)
Chloride: 101 mmol/L (ref 96–106)
Creatinine, Ser: 1.25 mg/dL — ABNORMAL HIGH (ref 0.57–1.00)
GFR calc Af Amer: 46 mL/min/1.73 — ABNORMAL LOW (ref >59)
GFR calc non Af Amer: 40 mL/min/1.73 — ABNORMAL LOW (ref >59)
Glucose: 110 mg/dL — ABNORMAL HIGH (ref 65–99)
Potassium: 4.1 mmol/L (ref 3.5–5.2)
Sodium: 143 mmol/L (ref 134–144)

## 2019-06-15 NOTE — Progress Notes (Signed)
Reviewed and agree with assessment/plan.   Roosvelt Churchwell, MD [facility] 10/20/2016, 12:24 PM Pager:  [phone]  

## 2019-06-21 ENCOUNTER — Other Ambulatory Visit: Payer: Self-pay | Admitting: Cardiovascular Disease

## 2019-06-22 ENCOUNTER — Other Ambulatory Visit: Payer: Self-pay | Admitting: Primary Care

## 2019-06-22 DIAGNOSIS — I10 Essential (primary) hypertension: Secondary | ICD-10-CM

## 2019-06-22 NOTE — Telephone Encounter (Signed)
Noted.  Refill denied.

## 2019-06-22 NOTE — Telephone Encounter (Signed)
Spoken to patient's daughter and stated that her grandson was checked out by 4 providers and proved that he did not have chickenpox. She still believe that it was and wants to further discuss with Anda Kraft on 06/25/2019

## 2019-06-22 NOTE — Telephone Encounter (Signed)
Last prescribed on 12/18/2018 . Looks like remove from cardio. No future appointment

## 2019-06-24 ENCOUNTER — Other Ambulatory Visit: Payer: Self-pay | Admitting: Cardiovascular Disease

## 2019-06-25 ENCOUNTER — Other Ambulatory Visit: Payer: Self-pay | Admitting: *Deleted

## 2019-06-25 MED ORDER — POTASSIUM CHLORIDE ER 10 MEQ PO TBCR: EXTENDED_RELEASE_TABLET | ORAL | 6 refills | Status: DC

## 2019-06-25 MED ORDER — FUROSEMIDE 40 MG PO TABS: ORAL_TABLET | ORAL | 11 refills | Status: DC

## 2019-06-26 ENCOUNTER — Ambulatory Visit (INDEPENDENT_AMBULATORY_CARE_PROVIDER_SITE_OTHER): Payer: Medicare Other | Admitting: Primary Care

## 2019-06-26 ENCOUNTER — Encounter: Payer: Self-pay | Admitting: Primary Care

## 2019-06-26 ENCOUNTER — Ambulatory Visit: Payer: Medicare Other

## 2019-06-26 ENCOUNTER — Other Ambulatory Visit: Payer: Self-pay

## 2019-06-26 ENCOUNTER — Ambulatory Visit (INDEPENDENT_AMBULATORY_CARE_PROVIDER_SITE_OTHER): Payer: Medicare Other

## 2019-06-26 VITALS — BP 110/70 | HR 63 | Temp 98.0°F | Ht 70.0 in | Wt 157.2 lb

## 2019-06-26 VITALS — BP 121/72 | Ht 70.0 in | Wt 153.0 lb

## 2019-06-26 DIAGNOSIS — Z950 Presence of cardiac pacemaker: Secondary | ICD-10-CM

## 2019-06-26 DIAGNOSIS — I341 Nonrheumatic mitral (valve) prolapse: Secondary | ICD-10-CM

## 2019-06-26 DIAGNOSIS — Z0001 Encounter for general adult medical examination with abnormal findings: Secondary | ICD-10-CM

## 2019-06-26 DIAGNOSIS — Z0000 Encounter for general adult medical examination without abnormal findings: Secondary | ICD-10-CM

## 2019-06-26 DIAGNOSIS — I714 Abdominal aortic aneurysm, without rupture, unspecified: Secondary | ICD-10-CM

## 2019-06-26 DIAGNOSIS — Z23 Encounter for immunization: Secondary | ICD-10-CM

## 2019-06-26 DIAGNOSIS — E7800 Pure hypercholesterolemia, unspecified: Secondary | ICD-10-CM

## 2019-06-26 DIAGNOSIS — N183 Chronic kidney disease, stage 3 unspecified: Secondary | ICD-10-CM

## 2019-06-26 DIAGNOSIS — L989 Disorder of the skin and subcutaneous tissue, unspecified: Secondary | ICD-10-CM

## 2019-06-26 DIAGNOSIS — I10 Essential (primary) hypertension: Secondary | ICD-10-CM

## 2019-06-26 DIAGNOSIS — M810 Age-related osteoporosis without current pathological fracture: Secondary | ICD-10-CM

## 2019-06-26 DIAGNOSIS — E2839 Other primary ovarian failure: Secondary | ICD-10-CM

## 2019-06-26 DIAGNOSIS — R42 Dizziness and giddiness: Secondary | ICD-10-CM

## 2019-06-26 DIAGNOSIS — E039 Hypothyroidism, unspecified: Secondary | ICD-10-CM

## 2019-06-26 DIAGNOSIS — J4530 Mild persistent asthma, uncomplicated: Secondary | ICD-10-CM

## 2019-06-26 DIAGNOSIS — F3342 Major depressive disorder, recurrent, in full remission: Secondary | ICD-10-CM

## 2019-06-26 DIAGNOSIS — Z8639 Personal history of other endocrine, nutritional and metabolic disease: Secondary | ICD-10-CM

## 2019-06-26 DIAGNOSIS — R32 Unspecified urinary incontinence: Secondary | ICD-10-CM

## 2019-06-26 DIAGNOSIS — F0280 Dementia in other diseases classified elsewhere without behavioral disturbance: Secondary | ICD-10-CM

## 2019-06-26 DIAGNOSIS — I5032 Chronic diastolic (congestive) heart failure: Secondary | ICD-10-CM

## 2019-06-26 DIAGNOSIS — I2510 Atherosclerotic heart disease of native coronary artery without angina pectoris: Secondary | ICD-10-CM

## 2019-06-26 DIAGNOSIS — G301 Alzheimer's disease with late onset: Secondary | ICD-10-CM

## 2019-06-26 LAB — CBC
HCT: 39.1 % (ref 36.0–46.0)
Hemoglobin: 13.2 g/dL (ref 12.0–15.0)
MCHC: 33.8 g/dL (ref 30.0–36.0)
MCV: 94.7 fl (ref 78.0–100.0)
Platelets: 156 10*3/uL (ref 150.0–400.0)
RBC: 4.13 Mil/uL (ref 3.87–5.11)
RDW: 13.5 % (ref 11.5–15.5)
WBC: 4.9 10*3/uL (ref 4.0–10.5)

## 2019-06-26 LAB — LIPID PANEL
Cholesterol: 128 mg/dL (ref 0–200)
HDL: 53.2 mg/dL (ref >39.00)
LDL Cholesterol: 36 mg/dL (ref 0–99)
NonHDL: 75.03
Total CHOL/HDL Ratio: 2
Triglycerides: 194 mg/dL — ABNORMAL HIGH (ref 0.0–149.0)
VLDL: 38.8 mg/dL (ref 0.0–40.0)

## 2019-06-26 LAB — COMPREHENSIVE METABOLIC PANEL
ALT: 14 U/L (ref 0–35)
AST: 18 U/L (ref 0–37)
Albumin: 3.7 g/dL (ref 3.5–5.2)
Alkaline Phosphatase: 63 U/L (ref 39–117)
BUN: 29 mg/dL — ABNORMAL HIGH (ref 6–23)
CO2: 31 mEq/L (ref 19–32)
Calcium: 9 mg/dL (ref 8.4–10.5)
Chloride: 108 mEq/L (ref 96–112)
Creatinine, Ser: 1.3 mg/dL — ABNORMAL HIGH (ref 0.40–1.20)
GFR: 39.13 mL/min — ABNORMAL LOW (ref >60.00)
Glucose, Bld: 105 mg/dL — ABNORMAL HIGH (ref 70–99)
Potassium: 3.4 mEq/L — ABNORMAL LOW (ref 3.5–5.1)
Sodium: 147 mEq/L — ABNORMAL HIGH (ref 135–145)
Total Bilirubin: 0.6 mg/dL (ref 0.2–1.2)
Total Protein: 6.2 g/dL (ref 6.0–8.3)

## 2019-06-26 LAB — TSH: TSH: 0.86 u[IU]/mL (ref 0.35–4.50)

## 2019-06-26 NOTE — Progress Notes (Signed)
Subjective:    Patient ID: [name], female    DOB: 06-10-1934, 85 y.o.   MRN: [id number]  HPI  [name] is an 85 year old female who presents today for complete physical.  She is with her daughter who is providing most of the information for her HPI.  She is a caregiver that sits with her Monday through Friday from 7:30 AM to 5 PM.  Her daughter endorses that her caregiver makes notes about her mother experiencing dizziness in the morning when she wakes up.  Her mother has fallen 3 times within the last month, 2 of which were when ambulating to get to the bathroom at night.  Her daughter states that her mother was moving around while sitting up in bed due to dizziness.  She continues to experience urinary incontinence despite oxybutynin.  She is on furosemide 40 mg per cardiology.  She is saturating 3-4 pads nightly.  History of hysterectomy.  Her daughter also endorses a lesion to the right buttocks, also redness.  She will sit in her saturated pads before changing.  Immunizations: -Tetanus: Completed in 2012 -Influenza: Due, will get from her daughter's work -Pneumonia: Completed in 2011, 2015 -Shingles: Completed Zostavax in 2008, would like Shingrix.  Diet: She endorses a healthy diet.  Exercise: She is not exercising. Some walking.  Eye exam: Due, she will reschedule. Typically goes 3-4 times weekly. Dental exam: Completes semi-annually typically, no recent visit.  Colonoscopy: Declines given age, Mammogram: Completed in 2017, declines  Dexa: Completed in 2018  BP Readings from Last 3 Encounters:  06/26/19 110/70  06/26/19 121/72  05/02/19 (!) 96/50     Review of Systems  Constitutional: Negative for unexpected weight change.  HENT: Negative for rhinorrhea.   Respiratory: Negative for cough.        Chronic dyspnea upon exertion.  Cardiovascular: Negative for chest pain.  Gastrointestinal: Negative for constipation and diarrhea.  Genitourinary: Negative  for difficulty urinating.       Urinary incontinence  Musculoskeletal: Positive for arthralgias.  Skin:       Skin lesion to buttocks, skin redness to buttocks  Allergic/Immunologic: Negative for environmental allergies.  Neurological: Positive for dizziness. Negative for numbness and headaches.  Psychiatric/Behavioral: The patient is not nervous/anxious.        Past Medical History:  Diagnosis Date  . AAA (abdominal aortic aneurysm) (HCC) 01/03/13   3.5x3.3cm  . Alzheimer's dementia (HCC)    "probably middle stage" (11/05/2014)  . Anemia    "hx of chronic" (11/05/2014)  . Aortic stenosis 03/29/2008   biological prosthetic replacement  . Arthritis    "joints" (11/05/2014)  . Asthma   . Chronic asthmatic bronchitis (HCC)    "q fall and winter" (11/05/2014)  . Coronary artery disease    CABG 03/29/08  . Dyslipidemia   . Exertional shortness of breath   . Family history of adverse reaction to anesthesia    "daughter gets PONV too"  . Heart murmur   . Hypertension   . Hypothyroidism   . LBBB (left bundle branch block)   . Migraines    "none in years' (11/05/2014)  . Mobitz type 2 second degree AV block 10/31/2014  . MVP (mitral valve prolapse)    with mild mitral insufficiency/notes 08/17/2013  . PONV (postoperative nausea and vomiting)   . Presence of permanent cardiac pacemaker   . Sinus pause 10/31/2014  . Stroke [facility]) 03/2008   "2 light strokes"; denies residual on 11/05/2014  .  Urinary frequency      Social History   Socioeconomic History  . Marital status: Divorced    Spouse name: Not on file  . Number of children: 1  . Years of education: Some college  . Highest education level: Not on file  Occupational History  . Occupation: Retired  Scientific laboratory technician  . Financial resource strain: Not hard at all  . Food insecurity    Worry: Never true    Inability: Never true  . Transportation needs    Medical: No    Non-medical: No  Tobacco Use  . Smoking status: Never Smoker  .  Smokeless tobacco: Never Used  Substance and Sexual Activity  . Alcohol use: No    Alcohol/week: 0.0 standard drinks  . Drug use: No  . Sexual activity: Not Currently  Lifestyle  . Physical activity    Days per week: 0 days    Minutes per session: 0 min  . Stress: Not at all  Relationships  . Social Herbalist on phone: Not on file    Gets together: Not on file    Attends religious service: Not on file    Active member of club or organization: Not on file    Attends meetings of clubs or organizations: Not on file    Relationship status: Not on file  . Intimate partner violence    Fear of current or ex partner: No    Emotionally abused: No    Physically abused: No    Forced sexual activity: No  Other Topics Concern  . Not on file  Social History Narrative   Pt lives in single story home with her daughter and son-in-law   Has 1 child   Some college education   Retired Clinical cytogeneticist for the city of [place]     Past Surgical History:  Procedure Laterality Date  . ABDOMINAL HYSTERECTOMY  1977  . AORTIC VALVE REPLACEMENT  03/29/2008   PERICARDIAL TISSUE VALVE  . APPENDECTOMY  ?1977  . CARDIAC CATHETERIZATION  ~ 2009  . CATARACT EXTRACTION W/ INTRAOCULAR LENS IMPLANT Left   . CORONARY ARTERY BYPASS GRAFT  03/29/2008   LIMA TO LAD,SVG TO CX,SVG TO LEFT POSTEROLATERAL BRANCH  . HIP FRACTURE SURGERY Right 06/2010   "3 metal screws"   . INSERT / REPLACE / REMOVE PACEMAKER  11/05/2014  . LOOP RECORDER EXPLANT N/A 11/05/2014   Procedure: LOOP RECORDER EXPLANT;  Surgeon: Sanda Klein, MD;  Location: [facility];  Service: Cardiovascular;  Laterality: N/A;  . LOOP RECORDER IMPLANT N/A 09/17/2013   Procedure: LOOP RECORDER IMPLANT;  Surgeon: Sanda Klein, MD;  Location: [facility];  Service: Cardiovascular;  Laterality: N/A;  . PERMANENT PACEMAKER INSERTION N/A 11/05/2014   Procedure: PERMANENT PACEMAKER INSERTION;  Surgeon: Sanda Klein, MD;   Location: [facility] C[facility];  Service: Cardiovascular;  Laterality: N/A;  . REFRACTIVE SURGERY Bilateral    Archie Endo 04/28/2008 (08/17/2013)  . RETINAL DETACHMENT SURGERY Left 1994   Archie Endo 04/10/2008 (08/17/2013)  . TIBIAL TUBERCLERPLASTY  11/05/2014  . tooth pulled   06/2018  . TOTAL HIP ARTHROPLASTY Right 07/31/2018   Procedure: RIGHT TOTAL HIP ARTHROPLASTY ANTERIOR APPROACH;  Surgeon: Frederik Pear, MD;  Location: [facility] ORS;  Service: Orthopedics;  Laterality: Right;  . UTERINE FIBROID SURGERY  1960's    Family History  Problem Relation Age of Onset  . Heart failure Mother   . Diabetes Mother   . Hypertension Mother   . Hyperlipidemia Mother   .  Melanoma Grandchild   . Colon cancer Neg Hx     Allergies  Allergen Reactions  . Codeine Nausea And Vomiting and Other (See Comments)    Patient gets violently ill  . Morphine And Related Nausea And Vomiting and Other (See Comments)    Patient get violently ill    Current Outpatient Medications on File Prior to Visit  Medication Sig Dispense Refill  . alendronate (FOSAMAX) 70 MG tablet TAKE 1 TAB BY MOUTH ONCE A WEEK. ON SUNDAYS, TAKE WITH A FULL GLASS OF WATER ON AN EMPTY STOMACH. 12 tablet 1  . amoxicillin (AMOXIL) 500 MG tablet Take 2,000 mg by mouth See admin instructions. Take 2,000mg  by mouth 1 hour prior to dental appointments  1  . aspirin EC 81 MG tablet Take 1 tablet (81 mg total) by mouth 2 (two) times daily. 60 tablet 0  . atorvastatin (LIPITOR) 40 MG tablet TAKE 1 TABLET BY MOUTH EVERY DAY IN THE EVENING FOR CHOLESTEROL (Patient taking differently: Take 40 mg by mouth every evening. TAKE 1 TABLET BY MOUTH EVERY DAY IN THE EVENING FOR CHOLESTEROL) 90 tablet 3  . brimonidine (ALPHAGAN) 0.2 % ophthalmic solution Place 1 drop into the left eye 2 (two) times daily.  3  . cetirizine (ZYRTEC) 10 MG chewable tablet Chew 10 mg by mouth daily.    Marland Kitchen. FLUoxetine (PROZAC) 10 MG capsule TAKE 1 CAPSULE (10 MG TOTAL) BY MOUTH DAILY FOR ANXIETY AND  DEPRESSION 90 capsule 1  . fluticasone (FLONASE) 50 MCG/ACT nasal spray Place 1 spray into both nostrils daily. 16 g 2  . furosemide (LASIX) 40 MG tablet Please take 80 mg (2 tablets) in the morning and then 40 mg in the evening. 90 tablet 11  . levothyroxine (SYNTHROID) 50 MCG tablet TAKE 1 TABLET BY MOUTH DAILY BEFORE BREAKFAST 90 tablet 1  . losartan (COZAAR) 50 MG tablet Take 2 tablets (100 mg total) by mouth daily. For blood pressure 180 tablet 0  . montelukast (SINGULAIR) 10 MG tablet TAKE 1 TABLET BY MOUTH EVERYDAY AT BEDTIME 90 tablet 1  . NAMZARIC 28-10 MG CP24 Take 1 capsule by mouth daily.    Marland Kitchen. oxybutynin (DITROPAN-XL) 5 MG 24 hr tablet TAKE 1 TABLET (5 MG TOTAL) BY MOUTH AT BEDTIME. FOR URINARY INCONTINENCE. 90 tablet 3  . potassium chloride (K-DUR) 10 MEQ tablet Take 20 mEq (2 tablets) in the morning, 3 tablets (30 mEq) in the afternoon, and 3 tablets (30 mEq) in the evening. 240 tablet 6  . WIXELA INHUB 250-50 MCG/DOSE AEPB INHALE 1 PUFF INTO THE LUNGS 2 (TWO) TIMES DAILY. FOR COPD (Patient taking differently: Inhale 1 puff into the lungs 2 (two) times daily. ) 180 each 3   No current facility-administered medications on file prior to visit.     BP 110/70   Pulse 63   Temp 98 F (36.7 C) (Temporal)   Ht 5\' 10"  (1.778 m)   Wt 157 lb 4 oz (71.3 kg)   SpO2 97%   BMI 22.56 kg/m    Objective:   Physical Exam  Constitutional: She appears well-nourished.  HENT:  Right Ear: Tympanic membrane and ear canal normal.  Left Ear: Tympanic membrane and ear canal normal.  Mouth/Throat: Oropharynx is clear and moist.  Eyes: Pupils are equal, round, and reactive to light. EOM are normal.  Neck: Neck supple.  Cardiovascular: Normal rate and regular rhythm.  Respiratory: Effort normal and breath sounds normal.  GI: Soft. Bowel sounds are normal. There is  no abdominal tenderness.  Musculoskeletal: Normal range of motion.  Neurological: She is alert.  Answers some questions  appropriately  Skin: Skin is warm and dry.     Crater, scaly appearing lesion to right lower buttocks.  Psychiatric: She has a normal mood and affect.           Assessment & Plan:

## 2019-06-26 NOTE — Progress Notes (Signed)
Subjective:   [name] is a 85 y.o. female who presents for Medicare Annual (Subsequent) preventive examination.  This visit type was conducted due to national recommendations for restrictions regarding the COVID-19 Pandemic (e.g. social distancing). This format is felt to be most appropriate for this patient at this time. All issues noted in this document were discussed and addressed. No physical exam was performed (except for noted visual exam findings with Video Visits). This patient's daughter, Ms. Maylon Cos, has given permission to perform this visit via telephone. Vital signs may be absent or patient reported.  Patient location:  At home  Nurse location:  At home     Review of Systems:  n/a Cardiac Risk Factors include: advanced age (>28men, >35 women);dyslipidemia;hypertension     Objective:     Vitals: BP 121/72 Comment: per daughter  Ht 5\' 10"  (1.778 m) Comment: per daughter  Wt 153 lb (69.4 kg) Comment: per daughter  BMI 21.95 kg/m   Body mass index is 21.95 kg/m.  Advanced Directives 06/26/2019 07/31/2018 07/26/2018 06/20/2018 11/05/2014 09/17/2013 08/17/2013  Does Patient Have a Medical Advance Directive? Yes Yes Yes Yes No Patient does not have advance directive Patient does not have advance directive;Patient would not like information  Type of Public librarian Power of [place];Living will Healthcare Power of [place];Living will Healthcare Power of [place];Living will Healthcare Power of [place];Living will - - -  Does patient want to make changes to medical advance directive? - No - Patient declined No - Patient declined - - - -  Copy of Healthcare Power of Attorney in Chart? Yes - validated most recent copy scanned in chart (See row information) Yes - Yes - - -  Would patient like information on creating a medical advance directive? - - - - Yes - Educational materials given - -  Pre-existing out of facility DNR order (yellow form or pink  MOST form) - - - - - No -    Tobacco Social History   Tobacco Use  Smoking Status Never Smoker  Smokeless Tobacco Never Used     Counseling given: Not Answered   Clinical Intake:  Pre-visit preparation completed: Yes  Pain : No/denies pain     Nutritional Status: BMI of 19-24  Normal Nutritional Risks: None Diabetes: No  How often do you need to have someone help you when you read instructions, pamphlets, or other written materials from your doctor or pharmacy?: 1 - Never What is the last grade level you completed in school?: some college  Interpreter Needed?: No  Information entered by :: NAllen LPN  Past Medical History:  Diagnosis Date  . AAA (abdominal aortic aneurysm) (HCC) 01/03/13   3.5x3.3cm  . Alzheimer's dementia (HCC)    "probably middle stage" (11/05/2014)  . Anemia    "hx of chronic" (11/05/2014)  . Aortic stenosis 03/29/2008   biological prosthetic replacement  . Arthritis    "joints" (11/05/2014)  . Asthma   . Chronic asthmatic bronchitis (HCC)    "q fall and winter" (11/05/2014)  . Coronary artery disease    CABG 03/29/08  . Dyslipidemia   . Exertional shortness of breath   . Family history of adverse reaction to anesthesia    "daughter gets PONV too"  . Heart murmur   . Hypertension   . Hypothyroidism   . LBBB (left bundle branch block)   . Migraines    "none in years' (11/05/2014)  . Mobitz type 2 second degree AV block 10/31/2014  .  MVP (mitral valve prolapse)    with mild mitral insufficiency/notes 08/17/2013  . PONV (postoperative nausea and vomiting)   . Presence of permanent cardiac pacemaker   . Sinus pause 10/31/2014  . Stroke [facility](HCC) 03/2008   "2 light strokes"; denies residual on 11/05/2014  . Urinary frequency    Past Surgical History:  Procedure Laterality Date  . ABDOMINAL HYSTERECTOMY  1977  . AORTIC VALVE REPLACEMENT  03/29/2008   PERICARDIAL TISSUE VALVE  . APPENDECTOMY  ?1977  . CARDIAC CATHETERIZATION  ~ 2009  . CATARACT  EXTRACTION W/ INTRAOCULAR LENS IMPLANT Left   . CORONARY ARTERY BYPASS GRAFT  03/29/2008   LIMA TO LAD,SVG TO CX,SVG TO LEFT POSTEROLATERAL BRANCH  . HIP FRACTURE SURGERY Right 06/2010   "3 metal screws"   . INSERT / REPLACE / REMOVE PACEMAKER  11/05/2014  . LOOP RECORDER EXPLANT N/A 11/05/2014   Procedure: LOOP RECORDER EXPLANT;  Surgeon: Thurmon FairMihai Croitoru, MD;  Location: [facility];  Service: Cardiovascular;  Laterality: N/A;  . LOOP RECORDER IMPLANT N/A 09/17/2013   Procedure: LOOP RECORDER IMPLANT;  Surgeon: Thurmon FairMihai Croitoru, MD;  Location: [facility];  Service: Cardiovascular;  Laterality: N/A;  . PERMANENT PACEMAKER INSERTION N/A 11/05/2014   Procedure: PERMANENT PACEMAKER INSERTION;  Surgeon: Thurmon FairMihai Croitoru, MD;  Location: [facility];  Service: Cardiovascular;  Laterality: N/A;  . REFRACTIVE SURGERY Bilateral    [name]/notes 04/28/2008 (08/17/2013)  . RETINAL DETACHMENT SURGERY Left 1994   [name]/notes 04/10/2008 (08/17/2013)  . TIBIAL TUBERCLERPLASTY  11/05/2014  . tooth pulled   06/2018  . TOTAL HIP ARTHROPLASTY Right 07/31/2018   Procedure: RIGHT TOTAL HIP ARTHROPLASTY ANTERIOR APPROACH;  Surgeon: Gean Birchwoodowan, Frank, MD;  Location: [facility] OR[facility];  Service: Orthopedics;  Laterality: Right;  . UTERINE FIBROID SURGERY  1960's   Family History  Problem Relation Age of Onset  . Heart failure Mother   . Diabetes Mother   . Hypertension Mother   . Hyperlipidemia Mother   . Melanoma Grandchild   . Colon cancer Neg Hx    Social History   Socioeconomic History  . Marital status: Divorced    Spouse name: Not on file  . Number of children: 1  . Years of education: Some college  . Highest education level: Not on file  Occupational History  . Occupation: Retired  Engineer, productionocial Needs  . Financial resource strain: Not hard at all  . Food insecurity    Worry: Never true    Inability: Never true  . Transportation needs    Medical: No    Non-medical: No  Tobacco Use  . Smoking status: Never Smoker  . Smokeless tobacco:  Never Used  Substance and Sexual Activity  . Alcohol use: No    Alcohol/week: 0.0 standard drinks  . Drug use: No  . Sexual activity: Not Currently  Lifestyle  . Physical activity    Days per week: 0 days    Minutes per session: 0 min  . Stress: Not at all  Relationships  . Social Musicianconnections    Talks on phone: Not on file    Gets together: Not on file    Attends religious service: Not on file    Active member of club or organization: Not on file    Attends meetings of clubs or organizations: Not on file    Relationship status: Not on file  Other Topics Concern  . Not on file  Social History Narrative   Pt lives in single story home with her daughter and  son-in-law   Has 1 child   Some college education   Retired Catering managerdental insurance claims processor for [place]     Outpatient Encounter Medications as of 06/26/2019  Medication Sig  . alendronate (FOSAMAX) 70 MG tablet TAKE 1 TAB BY MOUTH ONCE A WEEK. ON SUNDAYS, TAKE WITH A FULL GLASS OF WATER ON AN EMPTY STOMACH.  Marland Kitchen. amoxicillin (AMOXIL) 500 MG tablet Take 2,000 mg by mouth See admin instructions. Take 2,000mg  by mouth 1 hour prior to dental appointments  . aspirin EC 81 MG tablet Take 1 tablet (81 mg total) by mouth 2 (two) times daily.  Marland Kitchen. atorvastatin (LIPITOR) 40 MG tablet TAKE 1 TABLET BY MOUTH EVERY DAY IN THE EVENING FOR CHOLESTEROL (Patient taking differently: Take 40 mg by mouth every evening. TAKE 1 TABLET BY MOUTH EVERY DAY IN THE EVENING FOR CHOLESTEROL)  . brimonidine (ALPHAGAN) 0.2 % ophthalmic solution Place 1 drop into the left eye 2 (two) times daily.  . cetirizine (ZYRTEC) 10 MG chewable tablet Chew 10 mg by mouth daily.  Marland Kitchen. FLUoxetine (PROZAC) 10 MG capsule TAKE 1 CAPSULE (10 MG TOTAL) BY MOUTH DAILY FOR ANXIETY AND DEPRESSION  . furosemide (LASIX) 40 MG tablet Please take 80 mg (2 tablets) in the morning and then 40 mg in the evening.  Marland Kitchen. levothyroxine (SYNTHROID) 50 MCG tablet TAKE 1 TABLET BY MOUTH  DAILY BEFORE BREAKFAST  . losartan (COZAAR) 50 MG tablet Take 2 tablets (100 mg total) by mouth daily. For blood pressure  . montelukast (SINGULAIR) 10 MG tablet TAKE 1 TABLET BY MOUTH EVERYDAY AT BEDTIME  . NAMZARIC 28-10 MG CP24 Take 1 capsule by mouth daily.  Marland Kitchen. oxybutynin (DITROPAN-XL) 5 MG 24 hr tablet TAKE 1 TABLET (5 MG TOTAL) BY MOUTH AT BEDTIME. FOR URINARY INCONTINENCE.  Marland Kitchen. potassium chloride (K-DUR) 10 MEQ tablet Take 20 mEq (2 tablets) in the morning, 3 tablets (30 mEq) in the afternoon, and 3 tablets (30 mEq) in the evening.  Monte Fantasia. WIXELA INHUB 250-50 MCG/DOSE AEPB INHALE 1 PUFF INTO THE LUNGS 2 (TWO) TIMES DAILY. FOR COPD (Patient taking differently: Inhale 1 puff into the lungs 2 (two) times daily. )  . fluticasone (FLONASE) 50 MCG/ACT nasal spray Place 1 spray into both nostrils daily. (Patient not taking: Reported on 02/21/2019)   No facility-administered encounter medications on file as of 06/26/2019.     Activities of Daily Living In your present state of health, do you have any difficulty performing the following activities: 06/26/2019 07/31/2018  Hearing? Y N  Vision? N N  Difficulty concentrating or making decisions? Malvin JohnsY Y  Walking or climbing stairs? N Y  Dressing or bathing? N N  Doing errands, shopping? Y N  Comment always has someone with -  Preparing Food and eating ? Y -  Using the Toilet? N -  In the past six months, have you accidently leaked urine? Y -  Do you have problems with loss of bowel control? N -  Managing your Medications? Y -  Managing your Finances? Y -  Housekeeping or managing your Housekeeping? Y -  Some recent data might be hidden    Patient Care Team: [name], [name], NP as PCP - General (Internal Medicine) Thurmon Fairroitoru, Mihai, MD as PCP - Cardiology (Cardiology)    Assessment:   This is a routine wellness examination for Adely.  Exercise Activities and Dietary recommendations Current Exercise Habits: The patient does not participate in  regular exercise at present  Goals    .  Patient Stated     Starting 06/20/2018, I will continue to take medications as prescribed.     . Patient Stated     06/26/2019, no goals       Fall Risk Fall Risk  06/26/2019 08/08/2018 06/20/2018 06/03/2017  Falls in the past year? 1 Yes Yes No  Comment 3 falls, loss of balance - 3 falls in December 2018; bruising to left hip and face -  Number falls in past yr: - 2 or more 2 or more -  Injury with Fall? - Yes Yes -  Risk Factor Category  - High Fall Risk High Fall Risk -  Risk for fall due to : History of fall(s);Impaired balance/gait;Medication side effect - History of fall(s);Impaired balance/gait -  Follow up Falls evaluation completed;Falls prevention discussed - - -   Is the patient's home free of loose throw rugs in walkways, pet beds, electrical cords, etc?   yes      Grab bars in the bathroom? no      Handrails on the stairs?   yes      Adequate lighting?   yes  Timed Get Up and Go performed: n/a  Depression Screen PHQ 2/9 Scores 06/26/2019 06/20/2018 06/03/2017  PHQ - 2 Score 0 0 0  PHQ- 9 Score - 0 -     Cognitive Function MMSE - Mini Mental State Exam 06/26/2019 06/20/2018 12/16/2016 11/15/2016  Not completed: (No Data) (No Data) - -  Orientation to time - - 4 5  Orientation to Place - - 5 5  Registration - - 3 3  Attention/ Calculation - - 5 5  Recall - - 1 2  Language- name 2 objects - - 2 2  Language- repeat - - 1 1  Language- follow 3 step command - - 3 3  Language- read & follow direction - - 1 1  Write a sentence - - 1 1  Copy design - - 1 1  Total score - - 27 29   Montreal Cognitive Assessment  08/08/2018  Visuospatial/ Executive (0/5) 5  Naming (0/3) 3  Attention: Read list of digits (0/2) 2  Attention: Read list of letters (0/1) 0  Attention: Serial 7 subtraction starting at 100 (0/3) 3  Language: Repeat phrase (0/2) 2  Language : Fluency (0/1) 1  Abstraction (0/2) 2  Delayed Recall (0/5) 0  Orientation (0/6)  4  Total 22      Immunization History  Administered Date(s) Administered  . Influenza,inj,Quad PF,6+ Mos 08/18/2013  . Influenza-Unspecified 07/25/2014, 08/11/2018  . Pneumococcal Conjugate-13 05/14/2014  . Pneumococcal Polysaccharide-23 08/04/2010  . Tdap 02/19/2011  . Zoster 12/15/2006    Qualifies for Shingles Vaccine? yes  Screening Tests Health Maintenance  Topic Date Due  . INFLUENZA VACCINE  05/26/2019  . TETANUS/TDAP  02/18/2021  . DEXA SCAN  Completed  . PNA vac Low Risk Adult  Completed    Cancer Screenings: Lung: Low Dose CT Chest recommended if Age 18-80 years, 30 pack-year currently smoking OR have quit w/in 15years. Patient does not qualify. Breast:  Up to date on Mammogram? No   Up to date of Bone Density/Dexa? Yes Colorectal: not required  Additional Screenings: : Hepatitis C Screening: n/a     Plan:    No goals set for patient at this time. Mini-Cog not completed, patient has dementia.  I have personally reviewed and noted the following in the patient's chart:   . Medical and social history . Use of alcohol,  tobacco or illicit drugs  . Current medications and supplements . Functional ability and status . Nutritional status . Physical activity . Advanced directives . List of other physicians . Hospitalizations, surgeries, and ER visits in previous 12 months . Vitals . Screenings to include cognitive, depression, and falls . Referrals and appointments  In addition, I have reviewed and discussed with patient certain preventive protocols, quality metrics, and best practice recommendations. A written personalized care plan for preventive services as well as general preventive health recommendations were provided to patient.     Kellie Simmering, LPN  06/27/7901

## 2019-06-26 NOTE — Assessment & Plan Note (Signed)
Chronic per daughter. Appears somewhat suspicious, referral placed to dermatology for further evaluation.

## 2019-06-26 NOTE — Assessment & Plan Note (Signed)
First Shingrix vaccination provided today.  Pneumonia vaccination up-to-date.  She will get her flu shot from her daughter's work. Bone density scan pending. Declines mammogram and colonoscopy given age, this seems appropriate. Recommended some weightbearing activity. Continue to work on diet. Exam today stable. Labs pending.

## 2019-06-26 NOTE — Assessment & Plan Note (Signed)
Seems to occur mostly in the morning when getting out of bed, this could be more orthostatic than anything.  Repeat labs pending. I recommended she have her caregiver check her blood pressure in the morning before getting out of bed. Her daughter also mentioned this to her cardiologist. I will CC both cardiology and neurology as FYI.  Neuro exam today stable.

## 2019-06-26 NOTE — Assessment & Plan Note (Signed)
Overall seems to be doing well on fluoxetine daily.  Continue same.

## 2019-06-26 NOTE — Assessment & Plan Note (Signed)
Continued despite oxybutynin. Also now on higher doses of furosemide which could be counteracting oxybutynin.  We will have her daughter hold oxybutynin for now.

## 2019-06-26 NOTE — Assessment & Plan Note (Signed)
Following with pulmonology, continue current regimen.  

## 2019-06-26 NOTE — Assessment & Plan Note (Signed)
Following with cardiology. Based off of ultrasound from 2018 she was to have repeat ultrasound in October 2019. Her daughter reports that she has an appointment with cardiology next week and will mention.

## 2019-06-26 NOTE — Progress Notes (Signed)
PCP notes:  Health Maintenance:  Flu is due.  Abnormal Screenings:  None  Patient concerns:  None  Nurse concerns:  None  Next PCP appt.: 06/26/2019 at 11:20

## 2019-06-26 NOTE — Patient Instructions (Signed)
[name] , Thank you for taking time to come for your Medicare Wellness Visit. I appreciate your ongoing commitment to your health goals. Please review the following plan we discussed and let me know if I can assist you in the future.   Screening recommendations/referrals: Colonoscopy: not required Mammogram: not required Bone Density: 06/2017 Recommended yearly ophthalmology/optometry visit for glaucoma screening and checkup Recommended yearly dental visit for hygiene and checkup  Vaccinations: Influenza vaccine: due Pneumococcal vaccine: 04/2014 Tdap vaccine: 01/2011 Shingles vaccine: discussed    Advanced directives: copy in chart  Conditions/risks identified: dementia  Next appointment: 06/26/2019 at 11:20   Preventive Care 83 Years and Older, Female Preventive care refers to lifestyle choices and visits with your health care provider that can promote health and wellness. What does preventive care include?  A yearly physical exam. This is also called an annual well check.  Dental exams once or twice a year.  Routine eye exams. Ask your health care provider how often you should have your eyes checked.  Personal lifestyle choices, including:  Daily care of your teeth and gums.  Regular physical activity.  Eating a healthy diet.  Avoiding tobacco and drug use.  Limiting alcohol use.  Practicing safe sex.  Taking low-dose aspirin every day.  Taking vitamin and mineral supplements as recommended by your health care provider. What happens during an annual well check? The services and screenings done by your health care provider during your annual well check will depend on your age, overall health, lifestyle risk factors, and family history of disease. Counseling  Your health care provider may ask you questions about your:  Alcohol use.  Tobacco use.  Drug use.  Emotional well-being.  Home and relationship well-being.  Sexual activity.  Eating habits.   History of falls.  Memory and ability to understand (cognition).  Work and work Statistician.  Reproductive health. Screening  You may have the following tests or measurements:  Height, weight, and BMI.  Blood pressure.  Lipid and cholesterol levels. These may be checked every 5 years, or more frequently if you are over 55 years old.  Skin check.  Lung cancer screening. You may have this screening every year starting at age 53 if you have a 30-pack-year history of smoking and currently smoke or have quit within the past 15 years.  Fecal occult blood test (FOBT) of the stool. You may have this test every year starting at age 63.  Flexible sigmoidoscopy or colonoscopy. You may have a sigmoidoscopy every 5 years or a colonoscopy every 10 years starting at age 80.  Hepatitis C blood test.  Hepatitis B blood test.  Sexually transmitted disease (STD) testing.  Diabetes screening. This is done by checking your blood sugar (glucose) after you have not eaten for a while (fasting). You may have this done every 1-3 years.  Bone density scan. This is done to screen for osteoporosis. You may have this done starting at age 24.  Mammogram. This may be done every 1-2 years. Talk to your health care provider about how often you should have regular mammograms. Talk with your health care provider about your test results, treatment options, and if necessary, the need for more tests. Vaccines  Your health care provider may recommend certain vaccines, such as:  Influenza vaccine. This is recommended every year.  Tetanus, diphtheria, and acellular pertussis (Tdap, Td) vaccine. You may need a Td booster every 10 years.  Zoster vaccine. You may need this after age 29.  Pneumococcal 13-valent conjugate (PCV13) vaccine. One dose is recommended after age 40.  Pneumococcal polysaccharide (PPSV23) vaccine. One dose is recommended after age 76. Talk to your health care provider about which screenings  and vaccines you need and how often you need them. This information is not intended to replace advice given to you by your health care provider. Make sure you discuss any questions you have with your health care provider. Document Released: 11/07/2015 Document Revised: 06/30/2016 Document Reviewed: 08/12/2015 Elsevier Interactive Patient Education  2017 [employer] Prevention in the Home Falls can cause injuries. They can happen to people of all ages. There are many things you can do to make your home safe and to help prevent falls. What can I do on the outside of my home?  Regularly fix the edges of walkways and driveways and fix any cracks.  Remove anything that might make you trip as you walk through a door, such as a raised step or threshold.  Trim any bushes or trees on the path to your home.  Use bright outdoor lighting.  Clear any walking paths of anything that might make someone trip, such as rocks or tools.  Regularly check to see if handrails are loose or broken. Make sure that both sides of any steps have handrails.  Any raised decks and porches should have guardrails on the edges.  Have any leaves, snow, or ice cleared regularly.  Use sand or salt on walking paths during winter.  Clean up any spills in your garage right away. This includes oil or grease spills. What can I do in the bathroom?  Use night lights.  Install grab bars by the toilet and in the tub and shower. Do not use towel bars as grab bars.  Use non-skid mats or decals in the tub or shower.  If you need to sit down in the shower, use a plastic, non-slip stool.  Keep the floor dry. Clean up any water that spills on the floor as soon as it happens.  Remove soap buildup in the tub or shower regularly.  Attach bath mats securely with double-sided non-slip rug tape.  Do not have throw rugs and other things on the floor that can make you trip. What can I do in the bedroom?  Use night lights.   Make sure that you have a light by your bed that is easy to reach.  Do not use any sheets or blankets that are too big for your bed. They should not hang down onto the floor.  Have a firm chair that has side arms. You can use this for support while you get dressed.  Do not have throw rugs and other things on the floor that can make you trip. What can I do in the kitchen?  Clean up any spills right away.  Avoid walking on wet floors.  Keep items that you use a lot in easy-to-reach places.  If you need to reach something above you, use a strong step stool that has a grab bar.  Keep electrical cords out of the way.  Do not use floor polish or wax that makes floors slippery. If you must use wax, use non-skid floor wax.  Do not have throw rugs and other things on the floor that can make you trip. What can I do with my stairs?  Do not leave any items on the stairs.  Make sure that there are handrails on both sides of the stairs and use them.  Fix handrails that are broken or loose. Make sure that handrails are as long as the stairways.  Check any carpeting to make sure that it is firmly attached to the stairs. Fix any carpet that is loose or worn.  Avoid having throw rugs at the top or bottom of the stairs. If you do have throw rugs, attach them to the floor with carpet tape.  Make sure that you have a light switch at the top of the stairs and the bottom of the stairs. If you do not have them, ask someone to add them for you. What else can I do to help prevent falls?  Wear shoes that:  Do not have high heels.  Have rubber bottoms.  Are comfortable and fit you well.  Are closed at the toe. Do not wear sandals.  If you use a stepladder:  Make sure that it is fully opened. Do not climb a closed stepladder.  Make sure that both sides of the stepladder are locked into place.  Ask someone to hold it for you, if possible.  Clearly mark and make sure that you can see:  Any  grab bars or handrails.  First and last steps.  Where the edge of each step is.  Use tools that help you move around (mobility aids) if they are needed. These include:  Canes.  Walkers.  Scooters.  Crutches.  Turn on the lights when you go into a dark area. Replace any light bulbs as soon as they burn out.  Set up your furniture so you have a clear path. Avoid moving your furniture around.  If any of your floors are uneven, fix them.  If there are any pets around you, be aware of where they are.  Review your medicines with your doctor. Some medicines can make you feel dizzy. This can increase your chance of falling. Ask your doctor what other things that you can do to help prevent falls. This information is not intended to replace advice given to you by your health care provider. Make sure you discuss any questions you have with your health care provider. Document Released: 08/07/2009 Document Revised: 03/18/2016 Document Reviewed: 11/15/2014 Elsevier Interactive Patient Education  2017 [employer].

## 2019-06-26 NOTE — Assessment & Plan Note (Signed)
Stable in the office today. Question if dizziness is secondary to orthostatic hypotension in the morning or during the night which is causing dizziness and falls.  We will have her mention the dizziness to her cardiologist during her upcoming visit next week.  I also recommended that the caregiver check her blood pressure in the morning before she gets out of bed.  Continue current regimen for now.

## 2019-06-26 NOTE — Assessment & Plan Note (Signed)
Appears stable compared to labs drawn in August. Repeat renal function pending today.

## 2019-06-26 NOTE — Patient Instructions (Signed)
Stop by the lab prior to leaving today. I will notify you of your results once received.   I will reach out to Dr. Delice Lesch in regards to your dizziness.  Have your caregiver check your blood pressure before you get out of bed in the morning and/or when you feel dizzy.  Schedule a nurse visit for 2-6 months from today to get your second shingles vaccination.  You will be contacted regarding your referral to Dermatology.  Please let us know if you have not been contacted within one week.   Call the breast center to schedule the bone density scan.  It was a pleasure to see you today!

## 2019-06-26 NOTE — Assessment & Plan Note (Signed)
Repeat lipids pending. Continue atorvastatin.  

## 2019-06-26 NOTE — Assessment & Plan Note (Signed)
Following with cardiology. Repeat lipid panel pending. Continue statin therapy, aspirin, blood pressure control.

## 2019-06-26 NOTE — Addendum Note (Signed)
Addended by: Jacqualin Combes on: 06/26/2019 03:34 PM   Modules accepted: Orders

## 2019-06-26 NOTE — Assessment & Plan Note (Signed)
Progressive, is able to contribute somewhat to HPI. Following with neurology.  Continue current regimen. We will send cardiology and neurology and update regarding dizziness.

## 2019-06-26 NOTE — Assessment & Plan Note (Signed)
Following with electrophysiology, reviewed recent remote pacer check.

## 2019-06-26 NOTE — Assessment & Plan Note (Signed)
Compliant to Fosamax once weekly. Repeat bone density pending.

## 2019-06-26 NOTE — Assessment & Plan Note (Signed)
Her daughter is providing her levothyroxine with her 12 other medications in the morning.  Discussed appropriate instructions for administration.  Last TSH was normal, repeat TSH pending.

## 2019-06-27 ENCOUNTER — Other Ambulatory Visit: Payer: Self-pay

## 2019-06-27 DIAGNOSIS — I5032 Chronic diastolic (congestive) heart failure: Secondary | ICD-10-CM

## 2019-06-27 DIAGNOSIS — Z79899 Other long term (current) drug therapy: Secondary | ICD-10-CM

## 2019-06-27 MED ORDER — FUROSEMIDE 80 MG PO TABS: ORAL_TABLET | ORAL | 3 refills | Status: DC

## 2019-06-27 NOTE — Progress Notes (Signed)
Bmet 

## 2019-06-29 ENCOUNTER — Encounter: Payer: Self-pay | Admitting: *Deleted

## 2019-06-29 ENCOUNTER — Encounter: Payer: Self-pay | Admitting: Primary Care

## 2019-07-03 ENCOUNTER — Encounter: Payer: Self-pay | Admitting: Cardiovascular Disease

## 2019-07-03 ENCOUNTER — Ambulatory Visit (INDEPENDENT_AMBULATORY_CARE_PROVIDER_SITE_OTHER): Payer: Medicare Other | Admitting: Cardiovascular Disease

## 2019-07-03 ENCOUNTER — Other Ambulatory Visit: Payer: Self-pay

## 2019-07-03 VITALS — BP 128/64 | HR 70 | Ht 67.5 in | Wt 156.0 lb

## 2019-07-03 DIAGNOSIS — I10 Essential (primary) hypertension: Secondary | ICD-10-CM

## 2019-07-03 DIAGNOSIS — I341 Nonrheumatic mitral (valve) prolapse: Secondary | ICD-10-CM

## 2019-07-03 DIAGNOSIS — I471 Supraventricular tachycardia: Secondary | ICD-10-CM

## 2019-07-03 DIAGNOSIS — I5032 Chronic diastolic (congestive) heart failure: Secondary | ICD-10-CM | POA: Diagnosis not present

## 2019-07-03 DIAGNOSIS — Z950 Presence of cardiac pacemaker: Secondary | ICD-10-CM

## 2019-07-03 DIAGNOSIS — I714 Abdominal aortic aneurysm, without rupture, unspecified: Secondary | ICD-10-CM

## 2019-07-03 DIAGNOSIS — I4719 Other supraventricular tachycardia: Secondary | ICD-10-CM

## 2019-07-03 DIAGNOSIS — E7800 Pure hypercholesterolemia, unspecified: Secondary | ICD-10-CM

## 2019-07-03 DIAGNOSIS — R2232 Localized swelling, mass and lump, left upper limb: Secondary | ICD-10-CM

## 2019-07-03 DIAGNOSIS — Z953 Presence of xenogenic heart valve: Secondary | ICD-10-CM

## 2019-07-03 DIAGNOSIS — I2510 Atherosclerotic heart disease of native coronary artery without angina pectoris: Secondary | ICD-10-CM

## 2019-07-03 DIAGNOSIS — I739 Peripheral vascular disease, unspecified: Secondary | ICD-10-CM

## 2019-07-03 NOTE — Patient Instructions (Signed)
Medication Instructions:  Do not take the furosemide or potassium if your weight is 153 pounds or less.  If you need a refill on your cardiac medications before your next appointment, please call your pharmacy.   Lab work: None ordered If you have labs (blood work) drawn today and your tests are completely normal, you will receive your results only by: Marland Kitchen MyChart Message (if you have MyChart) OR . A paper copy in the mail If you have any lab test that is abnormal or we need to change your treatment, we will call you to review the results.  Testing/Procedures: None ordered  Follow-Up: At [facility], you and your health needs are our priority.  As part of our continuing mission to provide you with exceptional heart care, we have created designated Provider Care Teams.  These Care Teams include your primary Cardiologist (physician) and Advanced Practice Providers (APPs -  Physician Assistants and Nurse Practitioners) who all work together to provide you with the care you need, when you need it. You will need a follow up appointment in 6 months.  Please call our office 2 months in advance to schedule this appointment.  You may see Sanda Klein, MD or one of the following Advanced Practice Providers on your designated Care Team: [place], [place] . Fabian Sharp, PA-C  Please continue to monitor your daily weights daily.

## 2019-07-03 NOTE — Progress Notes (Signed)
Cardiology office note     Date:  07/04/2019   ID:  Kathryn CaffeyRosalie S Stanton, DOB 12/15/1933, MRN [id number]  PCP:  [name], [name], NP  Cardiologist:  Thurmon FairMihai Shawanna Zanders, MD  Electrophysiologist:  None   Evaluation Performed:  Follow-Up Visit diastolic heart failure, s/p AVR and pacemaker  Chief Complaint: Weakness and falls  History of Present Illness:     The patient's daughter, [name], participated in the visit, due to the patient's short-term memory issues.  [name] is a 85 y.o. female with chronic heart failure with preserved left ventricular ejection fraction, history of aortic valve replacement with a biological prosthesis, mild mitral valve prolapse with mild mitral insufficiency, remote history of syncope due to Mobitz type II second-degree AV block with implantation of a dual-chamber permanent pacemaker (Medtronic, 2016), small AAA.  She is recently had a few falls and her nurse documented some orthostatic hypotension.  Her renal parameters suggested mild hypovolemia.  She was also having cramps in her hands.  She seems to be doing a little better after we reduced her furosemide dose last week and labs performed today confirm improvement in renal parameters.  Her daughter still sees her huffing and puffing after she takes a shower, but she does not have orthopnea, PND or any leg edema.  She continues to deny dizziness and palpitations, syncope, angina at rest or with activity or claudication.  She has not had any serious injuries with her falls.  She denies bleeding.  Her weight has been between 151 and 156 pounds on her home scale.  She has recently noticed a 6-7 cm ovoid soft lump on her left upper arm and wondered whether it was from 1 of her falls, but on exam it appears to be more consistent with a lipoma.  I had not noticed it before either.  Continues to have problems with short-term memory loss, very slowly worsening.  Comprehensive pacemaker interrogation on  July 12 shows normal pacemaker function and extremely low burden of atrial fibrillation (0.1%, long episode 12 minutes in April, since then mostly brief bursts of nonsustained atrial tachycardia; more than a year ago she did have episodes of atrial fibrillation lasting for a few hours.).  She is not pacemaker dependent.  Shows normal findings.  She is not pacemaker dependent.  Although the device was implanted for second-degree AV block she almost never paces the ventricle.   The aortic valve is a 21 mm Edwards MagnaEase bovine bioprosthesis placed in 2009.  Her dual-chamber Medtronic Adapta permanent pacemaker was implanted in 2016 for sinus pauses and second-degree AV block.  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  The patient does not have symptoms concerning for COVID-19 infection (fever, chills, cough, or new shortness of breath).    Past Medical History:  Diagnosis Date  . AAA (abdominal aortic aneurysm) (HCC) 01/03/13   3.5x3.3cm  . Alzheimer's dementia (HCC)    "probably middle stage" (11/05/2014)  . Anemia    "hx of chronic" (11/05/2014)  . Aortic stenosis 03/29/2008   biological prosthetic replacement  . Arthritis    "joints" (11/05/2014)  . Asthma   . Chronic asthmatic bronchitis (HCC)    "q fall and winter" (11/05/2014)  . Coronary artery disease    CABG 03/29/08  . Dyslipidemia   . Exertional shortness of breath   . Family history of adverse reaction to anesthesia    "daughter gets PONV too"  . Heart murmur   . Hypertension   .  Hypothyroidism   . LBBB (left bundle branch block)   . Migraines    "none in years' (11/05/2014)  . Mobitz type 2 second degree AV block 10/31/2014  . MVP (mitral valve prolapse)    with mild mitral insufficiency/notes 08/17/2013  . PONV (postoperative nausea and vomiting)   . Presence of permanent cardiac pacemaker   . Sinus pause 10/31/2014  . Stroke [facility]) 03/2008   "2 light strokes"; denies residual on 11/05/2014  .  Urinary frequency    Past Surgical History:  Procedure Laterality Date  . ABDOMINAL HYSTERECTOMY  1977  . AORTIC VALVE REPLACEMENT  03/29/2008   PERICARDIAL TISSUE VALVE  . APPENDECTOMY  ?1977  . CARDIAC CATHETERIZATION  ~ 2009  . CATARACT EXTRACTION W/ INTRAOCULAR LENS IMPLANT Left   . CORONARY ARTERY BYPASS GRAFT  03/29/2008   LIMA TO LAD,SVG TO CX,SVG TO LEFT POSTEROLATERAL BRANCH  . HIP FRACTURE SURGERY Right 06/2010   "3 metal screws"   . INSERT / REPLACE / REMOVE PACEMAKER  11/05/2014  . LOOP RECORDER EXPLANT N/A 11/05/2014   Procedure: LOOP RECORDER EXPLANT;  Surgeon: Sanda Klein, MD;  Location: [place] CATH LAB;  Service: Cardiovascular;  Laterality: N/A;  . LOOP RECORDER IMPLANT N/A 09/17/2013   Procedure: LOOP RECORDER IMPLANT;  Surgeon: Sanda Klein, MD;  Location: [place] CATH [facility];  Service: Cardiovascular;  Laterality: N/A;  . PERMANENT PACEMAKER INSERTION N/A 11/05/2014   Procedure: PERMANENT PACEMAKER INSERTION;  Surgeon: Sanda Klein, MD;  Location: [place] CATH LAB;  Service: Cardiovascular;  Laterality: N/A;  . REFRACTIVE SURGERY Bilateral    Archie Endo 04/28/2008 (08/17/2013)  . RETINAL DETACHMENT SURGERY Left 1994   Archie Endo 04/10/2008 (08/17/2013)  . TIBIAL TUBERCLERPLASTY  11/05/2014  . tooth pulled   06/2018  . TOTAL HIP ARTHROPLASTY Right 07/31/2018   Procedure: RIGHT TOTAL HIP ARTHROPLASTY ANTERIOR APPROACH;  Surgeon: Frederik Pear, MD;  Location: [facility];  Service: Orthopedics;  Laterality: Right;  . UTERINE FIBROID SURGERY  1960's     Current Meds  Medication Sig  . alendronate (FOSAMAX) 70 MG tablet TAKE 1 TAB BY MOUTH ONCE A WEEK. ON SUNDAYS, TAKE WITH A FULL GLASS OF WATER ON AN EMPTY STOMACH.  Marland Kitchen amoxicillin (AMOXIL) 500 MG tablet Take 2,000 mg by mouth See admin instructions. Take 2,000mg  by mouth 1 hour prior to dental appointments  . aspirin EC 81 MG tablet Take 1 tablet (81 mg total) by mouth 2 (two) times daily.  Marland Kitchen atorvastatin (LIPITOR) 40 MG tablet TAKE 1 TABLET BY MOUTH  EVERY DAY IN THE EVENING FOR CHOLESTEROL (Patient taking differently: Take 40 mg by mouth every evening. TAKE 1 TABLET BY MOUTH EVERY DAY IN THE EVENING FOR CHOLESTEROL)  . brimonidine (ALPHAGAN) 0.2 % ophthalmic solution Place 1 drop into the left eye 2 (two) times daily.  . cetirizine (ZYRTEC) 10 MG chewable tablet Chew 10 mg by mouth daily.  Marland Kitchen FLUoxetine (PROZAC) 10 MG capsule TAKE 1 CAPSULE (10 MG TOTAL) BY MOUTH DAILY FOR ANXIETY AND DEPRESSION  . fluticasone (FLONASE) 50 MCG/ACT nasal spray Place 1 spray into both nostrils daily.  . furosemide (LASIX) 80 MG tablet Please take 80 mg once a day.  . levothyroxine (SYNTHROID) 50 MCG tablet TAKE 1 TABLET BY MOUTH DAILY BEFORE BREAKFAST  . losartan (COZAAR) 50 MG tablet Take 2 tablets (100 mg total) by mouth daily. For blood pressure  . montelukast (SINGULAIR) 10 MG tablet TAKE 1 TABLET BY MOUTH EVERYDAY AT BEDTIME  . NAMZARIC 28-10 MG CP24 Take 1 capsule  by mouth daily.  . potassium chloride (K-DUR) 10 MEQ tablet Take 20 mEq (2 tablets) in the morning, 3 tablets (30 mEq) in the afternoon, and 3 tablets (30 mEq) in the evening.  Monte Fantasia INHUB 250-50 MCG/DOSE AEPB INHALE 1 PUFF INTO THE LUNGS 2 (TWO) TIMES DAILY. FOR COPD (Patient taking differently: Inhale 1 puff into the lungs 2 (two) times daily. )     Allergies:   Codeine and Morphine and related   Social History   Tobacco Use  . Smoking status: Never Smoker  . Smokeless tobacco: Never Used  Substance Use Topics  . Alcohol use: No    Alcohol/week: 0.0 standard drinks  . Drug use: No     Family Hx: The patient's family history includes Diabetes in her mother; Heart failure in her mother; Hyperlipidemia in her mother; Hypertension in her mother; Melanoma in her grandchild. There is no history of Colon cancer.  ROS:   Please see the history of present illness.    All other systems are reviewed and are negative  Prior CV studies:   The following studies were reviewed today: July  pacemaker download, echo 2018  Labs/Other Tests and Data Reviewed:    EKG:   ECG ordered today shows normal sinus rhythm and bifascicular block (right bundle branch block, left anterior fascicular block), prolonged QTC 483 ms.  No real change from previous tracing Recent Labs: 05/30/2019: BNP 157.8 06/26/2019: ALT 14; Hemoglobin 13.2; Platelets 156.0; TSH 0.86 07/03/2019: BUN 22; Creatinine, Ser 1.24; Potassium 4.3; Sodium 141   Recent Lipid Panel Lab Results  Component Value Date/Time   CHOL 128 06/26/2019 12:49 PM   TRIG 194.0 (H) 06/26/2019 12:49 PM   HDL 53.20 06/26/2019 12:49 PM   CHOLHDL 2 06/26/2019 12:49 PM   LDLCALC 36 06/26/2019 12:49 PM    Wt Readings from Last 3 Encounters:  07/03/19 156 lb (70.8 kg)  06/26/19 157 lb 4 oz (71.3 kg)  06/26/19 153 lb (69.4 kg)     Objective:    Vital Signs:  BP 128/64 (BP Location: Left Arm, Patient Position: Sitting, Cuff Size: Normal)   Pulse 70   Ht 5' 7.5" (1.715 m)   Wt 156 lb (70.8 kg)   BMI 24.07 kg/m     General: Alert, oriented x3, no distress Head: no evidence of trauma, PERRL, EOMI, no exophtalmos or lid lag, no myxedema, no xanthelasma; normal ears, nose and oropharynx Neck: normal jugular venous pulsations and no hepatojugular reflux; brisk carotid pulses without delay and no carotid bruits Chest: clear to auscultation, no signs of consolidation by percussion or palpation, normal fremitus, symmetrical and full respiratory excursions Cardiovascular: normal position and quality of the apical impulse, regular rhythm, normal first and second heart sounds, 2/6 early peaking systolic ejection murmur at the right upper sternal border, 1/6 apical late systolic murmur radiating to axilla, no diastolic murmurs, rubs or gallops Abdomen: no tenderness or distention, no masses by palpation, no abnormal pulsatility or arterial bruits, normal bowel sounds, no hepatosplenomegaly Extremities: no clubbing, cyanosis or edema; 2+ radial, ulnar  and brachial pulses bilaterally; 2+ right femoral, posterior tibial and dorsalis pedis pulses; 2+ left femoral, posterior tibial and dorsalis pedis pulses; no subclavian or femoral bruits There is a very soft and mobile subcutaneous mass that is mildly lobulated, not attached to the deep tissue planes, 6-7 cm in diameter and roughly circular on the external surface of her left upper arm, probably a lipoma Neurological: grossly nonfocal Psych: Normal mood and  affect   ASSESSMENT & PLAN:    1. Chronic diastolic heart failure (HCC)   2. S/P aortic valve replacement with bioprosthetic valve   3. Coronary artery disease involving native coronary artery of native heart without angina pectoris   4. MVP (mitral valve prolapse) with mild mitral insufficiency   5. PAT (paroxysmal atrial tachycardia) (HCC)   6. Pacemaker   7. AAA (abdominal aortic aneurysm) without rupture (HCC)   8. PAD (peripheral artery disease) (HCC)   9. Hypercholesterolemia   10. Essential hypertension   11. Arm mass, left      1. CHF: It seems that we may have overdone it with diuresis and she had symptoms of orthostatic hypotension.  We will cut back the furosemide to 80 mg daily, to be taken only on days that she weighs more than 153 pounds.  Hypokalemia and creatinine improved on labs ordered today. 2. Hx AVR: Normal prosthetic valve gradients, last evaluated by echo roughly 2 years ago 3. CAD s/p CABG: Asymptomatic/angina free 4. MVP/MR: Not yet hemodynamically significant. 5. PAT/AFib: On aspirin rather than full anticoagulation due to increased bleeding risk, falls and very low burden of arrhythmia.  If she has neurological symptoms or lengthy episodes of atrial fibrillation are documented, we may have to reconsider this strategy.  She has brief bursts of paroxysmal atrial tachycardia.  Avoiding beta blockers due to history of reactive airway disease. 6. PM:  normal device function not pacemaker dependent 7. AAA:  Relatively small with a maximum diameter of 3.7 cm, last evaluated by ultrasound in September 2018. 8. PAD: She has some stenosis of the mid aorta and 50% bilateral common iliac artery stenosis, but does not have any symptoms of claudication.  9. HLP:  On statin.  At target LDL less than 70. 10. HTN: Recent symptoms of orthostatic hypotension due to excessive diuresis, now improved 11. A dermatologist will take a look at her left upper extremity mass next week.  I think it is a lipoma and we can treated with observation.   COVID-19 Education: The signs and symptoms of COVID-19 were discussed with the patient and how to seek care for testing (follow up with PCP or arrange E-visit).  The importance of social distancing was discussed today.   Medication Adjustments/Labs and Tests Ordered: Current medicines are reviewed at length with the patient today.  Concerns regarding medicines are outlined above.   Tests Ordered: Orders Placed This Encounter  Procedures  . EKG 12-Lead    Medication Changes: No orders of the defined types were placed in this encounter.  Patient Instructions  Medication Instructions:  Do not take the furosemide or potassium if your weight is 153 pounds or less.  If you need a refill on your cardiac medications before your next appointment, please call your pharmacy.   Lab work: None ordered If you have labs (blood work) drawn today and your tests are completely normal, you will receive your results only by: Marland Kitchen. MyChart Message (if you have MyChart) OR . A paper copy in the mail If you have any lab test that is abnormal or we need to change your treatment, we will call you to review the results.  Testing/Procedures: None ordered  Follow-Up: At [facility], you and your health needs are our priority.  As part of our continuing mission to provide you with exceptional heart care, we have created designated Provider Care Teams.  These Care Teams include your  primary Cardiologist (physician) and Advanced Practice Providers (APPs -  Physician Assistants and Nurse Practitioners) who all work together to provide you with the care you need, when you need it. You will need a follow up appointment in 6 months.  Please call our office 2 months in advance to schedule this appointment.  You may see Taimur Fier CThurmon Fair or one of the following Advanced Practice Providers on your designated Care Team: [place], [place] . Micah Flesher, PA-C  Please continue to monitor your daily weights daily.      Disposition:  Follow up 3 months  Signed, [name], MD  07/04/2019 9:19 AM    [facility]

## 2019-07-04 ENCOUNTER — Other Ambulatory Visit (INDEPENDENT_AMBULATORY_CARE_PROVIDER_SITE_OTHER): Payer: Medicare Other

## 2019-07-04 DIAGNOSIS — I5032 Chronic diastolic (congestive) heart failure: Secondary | ICD-10-CM | POA: Diagnosis not present

## 2019-07-04 DIAGNOSIS — I10 Essential (primary) hypertension: Secondary | ICD-10-CM

## 2019-07-04 DIAGNOSIS — Z79899 Other long term (current) drug therapy: Secondary | ICD-10-CM | POA: Diagnosis not present

## 2019-07-04 DIAGNOSIS — I2510 Atherosclerotic heart disease of native coronary artery without angina pectoris: Secondary | ICD-10-CM | POA: Diagnosis not present

## 2019-07-04 LAB — BASIC METABOLIC PANEL
BUN/Creatinine Ratio: 18 (ref 12–28)
BUN: 22 mg/dL (ref 8–27)
CO2: 23 mmol/L (ref 20–29)
Calcium: 9 mg/dL (ref 8.7–10.3)
Chloride: 103 mmol/L (ref 96–106)
Creatinine, Ser: 1.24 mg/dL — ABNORMAL HIGH (ref 0.57–1.00)
GFR calc Af Amer: 47 mL/min/{1.73_m2} — ABNORMAL LOW (ref >59)
GFR calc non Af Amer: 41 mL/min/{1.73_m2} — ABNORMAL LOW (ref >59)
Glucose: 84 mg/dL (ref 65–99)
Potassium: 4.3 mmol/L (ref 3.5–5.2)
Sodium: 141 mmol/L (ref 134–144)

## 2019-07-24 ENCOUNTER — Other Ambulatory Visit: Payer: Self-pay | Admitting: Neurology

## 2019-07-29 ENCOUNTER — Other Ambulatory Visit: Payer: Self-pay | Admitting: Primary Care

## 2019-07-29 DIAGNOSIS — R059 Cough, unspecified: Secondary | ICD-10-CM

## 2019-07-29 DIAGNOSIS — J449 Chronic obstructive pulmonary disease, unspecified: Secondary | ICD-10-CM

## 2019-07-29 DIAGNOSIS — R05 Cough: Secondary | ICD-10-CM

## 2019-08-03 ENCOUNTER — Ambulatory Visit (INDEPENDENT_AMBULATORY_CARE_PROVIDER_SITE_OTHER): Payer: Medicare Other | Admitting: *Deleted

## 2019-08-03 DIAGNOSIS — I471 Supraventricular tachycardia: Secondary | ICD-10-CM | POA: Diagnosis not present

## 2019-08-03 DIAGNOSIS — I441 Atrioventricular block, second degree: Secondary | ICD-10-CM

## 2019-08-03 LAB — CUP PACEART REMOTE DEVICE CHECK
Battery Impedance: 180 Ohm
Battery Remaining Longevity: 153 mo
Battery Voltage: 2.78 V
Brady Statistic AP VP Percent: 0 %
Brady Statistic AP VS Percent: 22 %
Brady Statistic AS VP Percent: 0 %
Brady Statistic AS VS Percent: 77 %
Date Time Interrogation Session: 20201009100434
Implantable Lead Implant Date: 20160112
Implantable Lead Implant Date: 20160112
Implantable Lead Location: 753859
Implantable Lead Location: 753860
Implantable Lead Model: 5076
Implantable Lead Model: 5076
Implantable Pulse Generator Implant Date: 20160112
Lead Channel Impedance Value: 465 Ohm
Lead Channel Impedance Value: 514 Ohm
Lead Channel Pacing Threshold Amplitude: 0.625 V
Lead Channel Pacing Threshold Amplitude: 0.875 V
Lead Channel Pacing Threshold Pulse Width: 0.4 ms
Lead Channel Pacing Threshold Pulse Width: 0.4 ms
Lead Channel Setting Pacing Amplitude: 1.5 V
Lead Channel Setting Pacing Amplitude: 2 V
Lead Channel Setting Pacing Pulse Width: 0.4 ms
Lead Channel Setting Sensing Sensitivity: 2 mV

## 2019-08-07 ENCOUNTER — Other Ambulatory Visit: Payer: Self-pay | Admitting: Primary Care

## 2019-08-12 ENCOUNTER — Other Ambulatory Visit: Payer: Self-pay | Admitting: Primary Care

## 2019-08-12 DIAGNOSIS — E785 Hyperlipidemia, unspecified: Secondary | ICD-10-CM

## 2019-08-14 NOTE — Progress Notes (Signed)
Remote pacemaker transmission.   

## 2019-09-02 ENCOUNTER — Other Ambulatory Visit: Payer: Self-pay | Admitting: Primary Care

## 2019-09-02 DIAGNOSIS — E039 Hypothyroidism, unspecified: Secondary | ICD-10-CM

## 2019-09-02 NOTE — Progress Notes (Signed)
@Patient  ID: , female    DOB: 1933/11/23, 85 y.o.   MRN: [id number]  Chief Complaint  Patient presents with  . Follow-up    4 month f/u for asthma. States her breathing has been more labored. Cardiology increased Lasix for a while, noticed no difference.     Referring provider: [id number], NP  HPI:  85 year old female never smoker followed in our office for asthma  PMH: AAA, hyperlipidemia, osteoporosis, altered gait, at risk for falls, chronic kidney disease Smoker/ Smoking History: Never smoker Maintenance:  Wixela  Pt of: Dr. 91  09/03/2019  - Visit   85 year old female never smoker presenting to office today as a 32-month follow-up visit.  Patient reports she has been doing well since last office visit.  She has not had a significant increase in the amount of falls as she was having at last office visit.  She continues to be maintained on Wixela.  She has had some increased nasal congestion and increased cough over the last couple of days.  They believe this may be related to allergies.  Patient reports she has not noticed any changes with her breathing but her family who is with her today at the office visit reports that she has had increased work of breath with physical exertion.  Tests:   Pulmonary tests:  Spirometry 05/26/16 >> FEV1 1.84 (87%), FEV1% 63 PFT 12/06/18 >> FEV1 2.29 (102%), FEV1% 78, TLC 6.29 (111%), DLCO 75%, no BD  Cardiac tests:  Echo 01/26/17 >> EF 60 to 65%, grade 2 DD, bioprosthetic AV  FENO:  No results found for: NITRICOXIDE  PFT: PFT Results Latest Ref Rng & Units 12/06/2018  FVC-Pre L 2.93  FVC-Predicted Pre % 97  FVC-Post L 2.94  FVC-Predicted Post % 98  Pre FEV1/FVC % % 78  Post FEV1/FCV % % 77  FEV1-Pre L 2.29  FEV1-Predicted Pre % 102  FEV1-Post L 2.27  DLCO UNC% % 75  DLCO COR %Predicted % 96  TLC L 6.29  TLC % Predicted % 111  RV % Predicted % 133    WALK:  No flowsheet data found.  Imaging: No results  found.  Lab Results:  CBC    Component Value Date/Time   WBC 4.9 06/26/2019 1249   RBC 4.13 06/26/2019 1249   HGB 13.2 06/26/2019 1249   HCT 39.1 06/26/2019 1249   PLT 156.0 06/26/2019 1249   MCV 94.7 06/26/2019 1249   MCH 31.0 08/02/2018 0447   MCHC 33.8 06/26/2019 1249   RDW 13.5 06/26/2019 1249   LYMPHSABS 0.6 (L) 07/26/2018 0957   MONOABS 0.5 07/26/2018 0957   EOSABS 0.1 07/26/2018 0957   BASOSABS 0.0 07/26/2018 0957    BMET    Component Value Date/Time   NA 141 07/03/2019 1614   K 4.3 07/03/2019 1614   CL 103 07/03/2019 1614   CO2 23 07/03/2019 1614   GLUCOSE 84 07/03/2019 1614   GLUCOSE 105 (H) 06/26/2019 1249   BUN 22 07/03/2019 1614   CREATININE 1.24 (H) 07/03/2019 1614   CREATININE 0.96 (H) 11/03/2017 1536   CALCIUM 9.0 07/03/2019 1614   GFRNONAA 41 (L) 07/03/2019 1614   GFRAA 47 (L) 07/03/2019 1614    BNP    Component Value Date/Time   BNP 157.8 (H) 05/30/2019 1611    ProBNP    Component Value Date/Time   PROBNP 456.3 (H) 08/17/2013 2115    Specialty Problems      Pulmonary Problems   Mild  persistent asthma      Allergies  Allergen Reactions  . Codeine Nausea And Vomiting and Other (See Comments)    Patient gets violently ill  . Morphine And Related Nausea And Vomiting and Other (See Comments)    Patient get violently ill    Immunization History  Administered Date(s) Administered  . Influenza,inj,Quad PF,6+ Mos 08/18/2013  . Influenza-Unspecified 07/25/2014, 08/11/2018  . Pneumococcal Conjugate-13 05/14/2014  . Pneumococcal Polysaccharide-23 08/04/2010  . Tdap 02/19/2011  . Zoster 12/15/2006  . Zoster Recombinat (Shingrix) 06/26/2019    Past Medical History:  Diagnosis Date  . AAA (abdominal aortic aneurysm) (HCC) 01/03/13   3.5x3.3cm  . Alzheimer's dementia (HCC)    "probably middle stage" (11/05/2014)  . Anemia    "hx of chronic" (11/05/2014)  . Aortic stenosis 03/29/2008   biological prosthetic replacement  . Arthritis     "joints" (11/05/2014)  . Asthma   . Chronic asthmatic bronchitis (HCC)    "q fall and winter" (11/05/2014)  . Coronary artery disease    CABG 03/29/08  . Dyslipidemia   . Exertional shortness of breath   . Family history of adverse reaction to anesthesia    "daughter gets PONV too"  . Heart murmur   . Hypertension   . Hypothyroidism   . LBBB (left bundle branch block)   . Migraines    "none in years' (11/05/2014)  . Mobitz type 2 second degree AV block 10/31/2014  . MVP (mitral valve prolapse)    with mild mitral insufficiency/notes 08/17/2013  . PONV (postoperative nausea and vomiting)   . Presence of permanent cardiac pacemaker   . Sinus pause 10/31/2014  . Stroke [facility](HCC) 03/2008   "2 light strokes"; denies residual on 11/05/2014  . Urinary frequency     Tobacco History: Social History   Tobacco Use  Smoking Status Never Smoker  Smokeless Tobacco Never Used   Counseling given: Yes   Continue to not smoke  Outpatient Encounter Medications as of 09/03/2019  Medication Sig  . alendronate (FOSAMAX) 70 MG tablet TAKE 1 TAB BY MOUTH ONCE A WEEK. ON SUNDAYS, TAKE WITH A FULL GLASS OF WATER ON AN EMPTY STOMACH.  Marland Kitchen. amoxicillin (AMOXIL) 500 MG tablet Take 2,000 mg by mouth See admin instructions. Take 2,000mg  by mouth 1 hour prior to dental appointments  . aspirin EC 81 MG tablet Take 1 tablet (81 mg total) by mouth 2 (two) times daily.  Marland Kitchen. atorvastatin (LIPITOR) 40 MG tablet TAKE 1 TABLET BY MOUTH EVERY DAY IN THE EVENING FOR CHOLESTEROL  . brimonidine (ALPHAGAN) 0.2 % ophthalmic solution Place 1 drop into the left eye 2 (two) times daily.  . cetirizine (ZYRTEC) 10 MG chewable tablet Chew 10 mg by mouth daily.  Marland Kitchen. FLUoxetine (PROZAC) 10 MG capsule TAKE 1 CAPSULE (10 MG TOTAL) BY MOUTH DAILY FOR ANXIETY AND DEPRESSION  . fluticasone (FLONASE) 50 MCG/ACT nasal spray Place 1 spray into both nostrils daily.  . furosemide (LASIX) 80 MG tablet Please take 80 mg once a day.  . levothyroxine  (SYNTHROID) 50 MCG tablet TAKE 1 TABLET BY MOUTH DAILY BEFORE BREAKFAST  . losartan (COZAAR) 50 MG tablet TAKE 2 TABLETS (100 MG TOTAL) BY MOUTH DAILY. FOR BLOOD PRESSURE  . montelukast (SINGULAIR) 10 MG tablet TAKE 1 TABLET BY MOUTH EVERYDAY AT BEDTIME  . NAMZARIC 28-10 MG CP24 TAKE 1 CAPSULE BY MOUTH EVERY DAY  . potassium chloride (K-DUR) 10 MEQ tablet Take 20 mEq (2 tablets) in the morning, 3 tablets (30 mEq)  in the afternoon, and 3 tablets (30 mEq) in the evening.  Grant Ruts INHUB 250-50 MCG/DOSE AEPB INHALE 1 PUFF INTO THE LUNGS 2 (TWO) TIMES DAILY. FOR COPD  . [DISCONTINUED] fluticasone (FLONASE) 50 MCG/ACT nasal spray Place 1 spray into both nostrils daily.   No facility-administered encounter medications on file as of 09/03/2019.      Review of Systems  Review of Systems  Constitutional: Positive for fatigue. Negative for activity change and fever.  HENT: Negative for sinus pressure, sinus pain and sore throat.   Respiratory: Positive for shortness of breath and wheezing. Negative for cough.   Cardiovascular: Negative for chest pain and palpitations.  Gastrointestinal: Negative for diarrhea, nausea and vomiting.  Musculoskeletal: Negative for arthralgias.  Neurological: Negative for dizziness.  Psychiatric/Behavioral: Negative for sleep disturbance. The patient is not nervous/anxious.      Physical Exam  BP 112/66 (BP Location: Left Arm, Patient Position: Sitting, Cuff Size: Normal)   Pulse 76   Temp (!) 97.3 F (36.3 C) (Temporal)   Ht 5' 7.5" (1.715 m)   Wt 155 lb (70.3 kg)   SpO2 96%   BMI 23.92 kg/m   Wt Readings from Last 5 Encounters:  09/03/19 155 lb (70.3 kg)  07/03/19 156 lb (70.8 kg)  06/26/19 157 lb 4 oz (71.3 kg)  06/26/19 153 lb (69.4 kg)  05/02/19 159 lb (72.1 kg)    BMI Readings from Last 5 Encounters:  09/03/19 23.92 kg/m  07/03/19 24.07 kg/m  06/26/19 22.56 kg/m  06/26/19 21.95 kg/m  05/02/19 23.48 kg/m     Physical Exam Vitals signs  and nursing note reviewed.  Constitutional:      General: She is not in acute distress.    Appearance: Normal appearance. She is normal weight.  HENT:     Head: Normocephalic and atraumatic.     Right Ear: Tympanic membrane, ear canal and external ear normal. There is no impacted cerumen.     Left Ear: Tympanic membrane, ear canal and external ear normal. There is no impacted cerumen.     Nose: Congestion and rhinorrhea present.     Mouth/Throat:     Mouth: Mucous membranes are moist.     Pharynx: Oropharynx is clear.     Comments: Postnasal drip Eyes:     Comments: Large sunglasses on for most of the visit  Neck:     Musculoskeletal: Normal range of motion.  Cardiovascular:     Rate and Rhythm: Normal rate and regular rhythm.     Pulses: Normal pulses.     Heart sounds: Normal heart sounds. No murmur.  Pulmonary:     Effort: Pulmonary effort is normal. No respiratory distress.     Breath sounds: Normal breath sounds. No decreased air movement. No decreased breath sounds, wheezing or rales.  Skin:    General: Skin is warm and dry.     Capillary Refill: Capillary refill takes less than 2 seconds.  Neurological:     General: No focal deficit present.     Mental Status: She is alert and oriented to person, place, and time. Mental status is at baseline.     Gait: Gait abnormal (Walks with walker).  Psychiatric:        Mood and Affect: Mood normal. Affect is flat.        Behavior: Behavior normal.        Thought Content: Thought content normal.        Judgment: Judgment normal.  Assessment & Plan:   Mild persistent asthma Plan: Continue Wixella  Continue rescue inhaler as needed Restart Flonase nasal spray to help with allergy-like symptoms Follow-up with our office in 2 months    Altered gait Plan: Continue to walk with walker Increase overall physical activity    Return in about 2 months (around 11/03/2019), or if symptoms worsen or fail to improve, for  Follow up with Dr. Craige Cotta.   Coral Ceo, NP 09/03/2019   This appointment was 28 minutes long with over 50% of the time in direct face-to-face patient care, assessment, plan of care, and follow-up.

## 2019-09-03 ENCOUNTER — Encounter: Payer: Self-pay | Admitting: Pulmonary Disease

## 2019-09-03 ENCOUNTER — Other Ambulatory Visit: Payer: Self-pay

## 2019-09-03 ENCOUNTER — Ambulatory Visit: Payer: Medicare Other | Admitting: Pulmonary Disease

## 2019-09-03 VITALS — BP 112/66 | HR 76 | Temp 97.3°F | Ht 67.5 in | Wt 155.0 lb

## 2019-09-03 DIAGNOSIS — J4530 Mild persistent asthma, uncomplicated: Secondary | ICD-10-CM

## 2019-09-03 DIAGNOSIS — R269 Unspecified abnormalities of gait and mobility: Secondary | ICD-10-CM | POA: Diagnosis not present

## 2019-09-03 MED ORDER — FLUTICASONE PROPIONATE 50 MCG/ACT NA SUSP: 1.0000 | Freq: Every day | NASAL | 2 refills | Status: DC

## 2019-09-03 NOTE — Progress Notes (Signed)
Reviewed and agree with assessment/plan.   Herson Prichard, MD [facility] 10/20/2016, 12:24 PM Pager:  [phone]  

## 2019-09-03 NOTE — Assessment & Plan Note (Signed)
Plan: Continue Wixella  Continue rescue inhaler as needed Restart Flonase nasal spray to help with allergy-like symptoms Follow-up with our office in 2 months

## 2019-09-03 NOTE — Assessment & Plan Note (Signed)
Plan: Continue to walk with walker Increase overall physical activity

## 2019-09-03 NOTE — Patient Instructions (Addendum)
You were seen today by Lauraine Rinne, NP  for:   1. Mild persistent asthma without complication  Continue Wixela  I have called in Flonase for you to use 1 spray each nostril daily for nasal congestion and postnasal drip  Continue Zyrtec  Continue Singulair  Work on increasing your overall physical activity  2. Altered gait  Continue to walk with a walker  Work on increasing your overall physical activity    Follow Up:    Return in about 2 months (around 11/03/2019), or if symptoms worsen or fail to improve, for Follow up with Dr. Halford Chessman.  Okay to schedule II weeks sooner or 2 weeks later based off of Dr. Juanetta Gosling availability  Please do your part to reduce the spread of COVID-19:      Reduce your risk of any infection  and COVID19 by using the similar precautions used for avoiding the common cold or flu:  Marland Kitchen Wash your hands often with soap and warm water for at least 20 seconds.  If soap and water are not readily available, use an alcohol-based hand sanitizer with at least 60% alcohol.  . If coughing or sneezing, cover your mouth and nose by coughing or sneezing into the elbow areas of your shirt or coat, into a tissue or into your sleeve (not your hands). Langley Gauss A MASK when in public  . Avoid shaking hands with others and consider head nods or verbal greetings only. . Avoid touching your eyes, nose, or mouth with unwashed hands.  . Avoid close contact with people who are sick. . Avoid places or events with large numbers of people in one location, like concerts or sporting events. . If you have some symptoms but not all symptoms, continue to monitor at home and seek medical attention if your symptoms worsen. . If you are having a medical emergency, call 911.   [place] / e-Visit: [email or web address]         [facility]: [facility]:  [phone]                   [facility]: [phone]     It is flu season:   >>> Best ways to protect herself from the flu: Receive the yearly flu vaccine, practice good hand hygiene washing with soap and also using hand sanitizer when available, eat a nutritious meals, get adequate rest, hydrate appropriately   Please contact the office if your symptoms worsen or you have concerns that you are not improving.   Thank you for choosing [facility] for your healthcare, and for allowing [place] to partner with you on your healthcare journey. I am thankful to be able to provide care to you today.   Wyn Quaker FNP-C

## 2019-09-04 ENCOUNTER — Ambulatory Visit: Payer: Medicare Other | Admitting: *Deleted

## 2019-09-04 DIAGNOSIS — Z23 Encounter for immunization: Secondary | ICD-10-CM

## 2019-09-04 NOTE — Telephone Encounter (Signed)
Spoke with pts daughter. States that pt has become incontinent. Pt will say she has to go to the bathroom and by the time she says it she has already had a BM in her underwear.. This can be at home, during the night or at a store. Daughter is trying to limit fiber and liquids to help.  I suggested a bedside commode for home and setting a schedule or somehow keeping a record of when pt goes to the bathroom.  The daughter states that she is a Insurance account manager. She wants to know what is going to happen next with the pt. States that this incontinence may last 3-6 months. So then what?  Pt has a follow up scheduled in Jan.

## 2019-09-04 NOTE — Progress Notes (Addendum)
Pt here today for her Shingrix #2 and it was determined after talking to the patient/daughter and Kathryn Reel, NP that the second vaccine is not necessary OR if she wants to get it she can wait until closer to the 6 month time frame to get... Injection not given today.  When initially speaking to the patient I was explaining the need of signing a Waiver form, which is where the patient agrees to take over the responsibility of any bill that she may receive since she is Medicare - Some Medicare plans do not cover the vaccine and there is a charge upwards of $275-$400. Pt daughter stated that this was not explained at the visit where she got the first of the Shingrix vaccine. Last visit was on 06/26/2019 where the patient received her first vaccine in office - pt has yet to receive a bill.  It was brought to my attention that the patient has been having pain in her arm since her last vaccine injection - soreness every morning when she wakes up and there is some visible bruising still at the injection site. I advised that she might be having a slight reaction to the vaccine is that we are only at month two and she might not need to get the 2nd shot today. The patients daughter became more frustrated because of the previous conversation held at her last OV about coming back in two months to get the second shot and the importance of it and they are here now and we are not wanting to give her the shot - I advised that we are trying to provide the best patient care given the circumstances and if she has possibly had a reaction to the previous vaccine then it may be safer to wait until closer to Jan to have this done. I advised that I would go speak with [name] my RN Supervisor and Kathryn Reel, NP for further recommendations - I also offered an office visit to have her arm checked out, this was declined as they did NOT want to be seen in the car - I did offer an appt inside - still declined.   **The daughter was very  frustrated with the process our office has set up and that she feels all this is very impersonal and not good patient care. I apologized for any inconvenience and for her frustration with this process. I offered for [place] to go inside to have the discussion  Discussed with [name] the current symptoms and issues and it was agreed upon for that patient to wait either 6 months to get the 2nd part of the vaccine or to not get it all together.  She had Zostavax in 2008 - okay to not get at all if she is okay with that.  I went back out to discuss with the patient and daughter and the conversation escalated - I then apologized that I could not give the answers she wanted so I was going to see if [name] would come out and talk to her.   Discussed again with [name] and she went out and spoke with the patient.   Ending result was to wait until 6 month mark to get Shingrix #2 - Jan 2021  [name], CMA 09/04/19  Patient with right sided pain at injection site since initial injection in September 2020, arm appeared normal on exam with normal ROM. We discussed that given residual arm pain it would be best to revisit the second injection around January  2021 which would be four months from initial injection. Both mother and daughter agreed, we will contact them around that time. Daughter was much calmer at the end of our visit and thanked [place] for the information.   Kathryn Koch, NP

## 2019-09-26 ENCOUNTER — Telehealth: Payer: Self-pay | Admitting: Neurology

## 2019-09-26 ENCOUNTER — Other Ambulatory Visit: Payer: Self-pay

## 2019-09-26 ENCOUNTER — Telehealth (INDEPENDENT_AMBULATORY_CARE_PROVIDER_SITE_OTHER): Payer: Medicare Other | Admitting: Neurology

## 2019-09-26 ENCOUNTER — Encounter: Payer: Self-pay | Admitting: Neurology

## 2019-09-26 NOTE — Progress Notes (Signed)
Patient has an appointment in January, spoke to daughter on phone regarding concerns for upcoming visit.

## 2019-09-26 NOTE — Telephone Encounter (Signed)
Spoke to daughter Sharyn Lull regarding concerns. Sharyn Lull reports that since November, the patient has had bowel incontinence. Last week she messed up all her clothes and tried to clean herself, making a mess with stains all over the bathroom. She has always had bladder issues that have now worsened. She is verbal and very mobile. She is more stubborn and defiant, refusing to eat. Sharyn Lull asks for advice regarding eating. She would wake up in the morning, medications are administered, stays in bed until noon, then would not eat anything until supper. She would say she is not hungry. Sometimes if food is put in front of her, she would eat it, but if they tell her to eat, she would not want to eat, having daily arguments with her caregiver. There is no stomach or mouth pain. She does not refuse medications. No hallucinations. She has 2 caregivers during the day, then her daughter takes care of her the rest of the time. She does not want to be with her daughter, but does not want to be with anyone else. They are concerned that if she moves to a SNF, she would not want to participate with activities/socialize, and would wither/die soon after. She does not sleep until 2AM, which has always been the case, taking naps during the day. Instructed to increase Fluoxetine to 20mg  daily. Discussed having a good day/night routine, routine helps in dementia patients, consider sleep medication (mirtazapine or Trazodone). Dtr will discuss with husband, f/u as scheduled in January

## 2019-09-30 ENCOUNTER — Other Ambulatory Visit: Payer: Self-pay | Admitting: Primary Care

## 2019-09-30 DIAGNOSIS — M8580 Other specified disorders of bone density and structure, unspecified site: Secondary | ICD-10-CM

## 2019-10-01 NOTE — Telephone Encounter (Signed)
Refill sent to pharmacy.   

## 2019-10-01 NOTE — Telephone Encounter (Signed)
Last prescribed on 04/03/2019. Last appointment on  06/26/2019. No future appointment

## 2019-10-30 ENCOUNTER — Other Ambulatory Visit: Payer: Self-pay | Admitting: Primary Care

## 2019-10-30 DIAGNOSIS — R05 Cough: Secondary | ICD-10-CM

## 2019-10-30 DIAGNOSIS — R059 Cough, unspecified: Secondary | ICD-10-CM

## 2019-10-30 DIAGNOSIS — J449 Chronic obstructive pulmonary disease, unspecified: Secondary | ICD-10-CM

## 2019-11-02 ENCOUNTER — Telehealth: Payer: Medicare Other | Admitting: Neurology

## 2019-11-02 ENCOUNTER — Ambulatory Visit (INDEPENDENT_AMBULATORY_CARE_PROVIDER_SITE_OTHER): Payer: Medicare Other | Admitting: *Deleted

## 2019-11-02 DIAGNOSIS — I471 Supraventricular tachycardia: Secondary | ICD-10-CM

## 2019-11-02 LAB — CUP PACEART REMOTE DEVICE CHECK
Battery Impedance: 204 Ohm
Battery Remaining Longevity: 148 mo
Battery Voltage: 2.78 V
Brady Statistic AP VP Percent: 0 %
Brady Statistic AP VS Percent: 23 %
Brady Statistic AS VP Percent: 0 %
Brady Statistic AS VS Percent: 77 %
Date Time Interrogation Session: 20210108071042
Implantable Lead Implant Date: 20160112
Implantable Lead Implant Date: 20160112
Implantable Lead Location: 753859
Implantable Lead Location: 753860
Implantable Lead Model: 5076
Implantable Lead Model: 5076
Implantable Pulse Generator Implant Date: 20160112
Lead Channel Impedance Value: 466 Ohm
Lead Channel Impedance Value: 501 Ohm
Lead Channel Pacing Threshold Amplitude: 0.625 V
Lead Channel Pacing Threshold Amplitude: 0.875 V
Lead Channel Pacing Threshold Pulse Width: 0.4 ms
Lead Channel Pacing Threshold Pulse Width: 0.4 ms
Lead Channel Setting Pacing Amplitude: 1.5 V
Lead Channel Setting Pacing Amplitude: 2 V
Lead Channel Setting Pacing Pulse Width: 0.4 ms
Lead Channel Setting Sensing Sensitivity: 2 mV

## 2019-11-03 ENCOUNTER — Other Ambulatory Visit: Payer: Self-pay | Admitting: Primary Care

## 2019-11-04 ENCOUNTER — Other Ambulatory Visit: Payer: Self-pay | Admitting: Primary Care

## 2019-11-04 DIAGNOSIS — F3342 Major depressive disorder, recurrent, in full remission: Secondary | ICD-10-CM

## 2019-11-07 ENCOUNTER — Ambulatory Visit: Payer: Medicare Other | Admitting: Pulmonary Disease

## 2019-11-07 ENCOUNTER — Other Ambulatory Visit: Payer: Self-pay

## 2019-11-07 ENCOUNTER — Encounter: Payer: Self-pay | Admitting: Pulmonary Disease

## 2019-11-07 VITALS — BP 124/62 | HR 82 | Temp 97.3°F | Ht 67.5 in | Wt 157.0 lb

## 2019-11-07 DIAGNOSIS — J4530 Mild persistent asthma, uncomplicated: Secondary | ICD-10-CM | POA: Diagnosis not present

## 2019-11-07 DIAGNOSIS — J301 Allergic rhinitis due to pollen: Secondary | ICD-10-CM

## 2019-11-07 NOTE — Progress Notes (Signed)
[name], Critical Care, and Sleep Medicine  Chief Complaint  Patient presents with   Follow-up    Mild persistent asthma without complication    Constitutional:  BP 124/62 (BP Location: Right Arm, Patient Position: Sitting, Cuff Size: Normal)    Pulse 82    Temp (!) 97.3 F (36.3 C)    Ht 5' 7.5" (1.715 m)    Wt 157 lb (71.2 kg)    SpO2 96% Comment: on room air   BMI 24.23 kg/m   Past Medical History:  CVA, Mobitz 2 s/p PM, Hypothyroidism, HTN, HLD, CAD s/p CABG, OA, Aortic stenosis s/p bioprosthetic valve, Anemia, Alzheimer's dementia, AAA  Brief Summary:  [name] is a 86 y.o. female with asthma.  She is here with her daughter.  Cough is better. Not having sinus congestion, wheeze, or sputum.  Gets winded quickly, but recovers after resting.  Not very active since weather is colder and not going out much due to COVID.  Physical Exam:   Appearance - well kempt   ENMT - no sinus tenderness, no nasal discharge, no oral exudate  Neck - no masses, trachea midline, no thyromegaly, no elevation in JVP  Respiratory - normal appearance of chest wall, normal respiratory effort w/o accessory muscle use, no dullness on percussion, no wheezing or rales  CV - s1s2 regular rate and rhythm, no murmurs, no peripheral edema, radial pulses symmetric  GI - soft, non tender  Lymph - no adenopathy noted in neck and axillary areas  MSK - normal gait  Ext - no cyanosis, clubbing, or joint inflammation noted  Skin - no rashes, lesions, or ulcers  Neuro - normal strength, oriented x 3  Psych - normal mood and affect     Assessment/Plan:   Persistent asthma. - stable - continue wixela, singulair - prn albuterol  Upper airway cough with post nasal drip. - stable  - change zytrec to prn - continue singulair, flonase  Advice for COVID 19. - discussed pros/cons of vaccines, and known side effect profile - advised that she should get vaccine   Patient  Instructions  Can try switching zyrtec to as needed  Follow up in 6 months   A total of  24 minutes were spent face to face and non face to face with the patient and more than half of that time involved counseling or coordination of care.   Coralyn Helling, MD  [facility]/Critical Care Pager: [phone] 11/07/2019, 12:38 PM  Flow Sheet     [facility] tests:  Spirometry 05/26/16 >> FEV1 1.84 (87%), FEV1% 63 PFT 12/06/18 >> FEV1 2.29 (102%), FEV1% 78, TLC 6.29 (111%), DLCO 75%, no BD  Cardiac tests:  Echo 01/26/17 >> EF 60 to 65%, grade 2 DD, bioprosthetic AV  Medications:   Allergies as of 11/07/2019      Reactions   Codeine Nausea And Vomiting, Other (See Comments)   Patient gets violently ill   Morphine And Related Nausea And Vomiting, Other (See Comments)   Patient get violently ill      Medication List       Accurate as of November 07, 2019 12:38 PM. If you have any questions, ask your nurse or doctor.        alendronate 70 MG tablet Commonly known as: FOSAMAX TAKE 1 TAB BY MOUTH ONCE A WEEK. ON SUNDAYS, TAKE WITH A FULL GLASS OF WATER ON AN EMPTY STOMACH.   amoxicillin 500 MG tablet Commonly known as: AMOXIL Take 2,000 mg by  mouth See admin instructions. Take 2,000mg  by mouth 1 hour prior to dental appointments   aspirin EC 81 MG tablet Take 1 tablet (81 mg total) by mouth 2 (two) times daily.   atorvastatin 40 MG tablet Commonly known as: LIPITOR TAKE 1 TABLET BY MOUTH EVERY DAY IN THE EVENING FOR CHOLESTEROL   brimonidine 0.2 % ophthalmic solution Commonly known as: ALPHAGAN Place 1 drop into the left eye 2 (two) times daily.   cetirizine 10 MG chewable tablet Commonly known as: ZYRTEC Chew 10 mg by mouth daily.   FLUoxetine 10 MG capsule Commonly known as: PROZAC TAKE 1 CAPSULE (10 MG TOTAL) BY MOUTH DAILY FOR ANXIETY AND DEPRESSION   fluticasone 50 MCG/ACT nasal spray Commonly known as: FLONASE Place 1 spray into both nostrils daily.     furosemide 80 MG tablet Commonly known as: LASIX Please take 80 mg once a day.   levothyroxine 50 MCG tablet Commonly known as: SYNTHROID TAKE 1 TABLET BY MOUTH EVERY DAY BEFORE BREAKFAST   losartan 50 MG tablet Commonly known as: COZAAR TAKE 2 TABLETS (100 MG TOTAL) BY MOUTH DAILY. FOR BLOOD PRESSURE   montelukast 10 MG tablet Commonly known as: SINGULAIR TAKE 1 TABLET BY MOUTH EVERYDAY AT BEDTIME   Namzaric 28-10 MG Cp24 Generic drug: Memantine HCl-Donepezil HCl TAKE 1 CAPSULE BY MOUTH EVERY DAY   potassium chloride 10 MEQ tablet Commonly known as: KLOR-CON Take 20 mEq (2 tablets) in the morning, 3 tablets (30 mEq) in the afternoon, and 3 tablets (30 mEq) in the evening.   Wixela Inhub 250-50 MCG/DOSE Aepb Generic drug: Fluticasone-Salmeterol INHALE 1 PUFF INTO THE LUNGS 2 (TWO) TIMES DAILY. FOR COPD       Past Surgical History:  She  has a past surgical history that includes Coronary artery bypass graft (03/29/2008); Aortic valve replacement (03/29/2008); Uterine fibroid surgery (1960's); Retinal detachment surgery (Left, 1994); Cataract extraction w/ intraocular lens implant (Left); Abdominal hysterectomy (1977); Appendectomy (?1977); Hip fracture surgery (Right, 06/2010); Refractive surgery (Bilateral); Cardiac catheterization (~ 2009); loop recorder implant (N/A, 09/17/2013); Insert / replace / remove pacemaker (11/05/2014); Tibial tuberclerplasty (11/05/2014); loop recorder explant (N/A, 11/05/2014); permanent pacemaker insertion (N/A, 11/05/2014); tooth pulled  (06/2018); and Total hip arthroplasty (Right, 07/31/2018).  Family History:  Her family history includes Diabetes in her mother; Heart failure in her mother; Hyperlipidemia in her mother; Hypertension in her mother; Melanoma in her grandchild.  Social History:  She  reports that she has never smoked. She has never used smokeless tobacco. She reports that she does not drink alcohol or use drugs.

## 2019-11-07 NOTE — Patient Instructions (Signed)
Can try switching zyrtec to as needed  Follow up in 6 months

## 2019-12-07 ENCOUNTER — Telehealth: Payer: Medicare Other | Admitting: Neurology

## 2019-12-18 DIAGNOSIS — Z961 Presence of intraocular lens: Secondary | ICD-10-CM | POA: Diagnosis not present

## 2019-12-18 DIAGNOSIS — H40011 Open angle with borderline findings, low risk, right eye: Secondary | ICD-10-CM | POA: Diagnosis not present

## 2019-12-18 DIAGNOSIS — H401122 Primary open-angle glaucoma, left eye, moderate stage: Secondary | ICD-10-CM | POA: Diagnosis not present

## 2019-12-18 DIAGNOSIS — H33052 Total retinal detachment, left eye: Secondary | ICD-10-CM | POA: Diagnosis not present

## 2019-12-18 DIAGNOSIS — H43811 Vitreous degeneration, right eye: Secondary | ICD-10-CM | POA: Diagnosis not present

## 2019-12-26 ENCOUNTER — Ambulatory Visit (INDEPENDENT_AMBULATORY_CARE_PROVIDER_SITE_OTHER): Payer: Medicare Other | Admitting: Primary Care

## 2019-12-26 ENCOUNTER — Encounter: Payer: Self-pay | Admitting: Primary Care

## 2019-12-26 ENCOUNTER — Other Ambulatory Visit: Payer: Self-pay

## 2019-12-26 VITALS — BP 120/70 | HR 62 | Temp 97.2°F | Ht 67.5 in | Wt 157.5 lb

## 2019-12-26 DIAGNOSIS — I10 Essential (primary) hypertension: Secondary | ICD-10-CM

## 2019-12-26 DIAGNOSIS — Z950 Presence of cardiac pacemaker: Secondary | ICD-10-CM

## 2019-12-26 DIAGNOSIS — G301 Alzheimer's disease with late onset: Secondary | ICD-10-CM

## 2019-12-26 DIAGNOSIS — I2510 Atherosclerotic heart disease of native coronary artery without angina pectoris: Secondary | ICD-10-CM

## 2019-12-26 DIAGNOSIS — Z23 Encounter for immunization: Secondary | ICD-10-CM | POA: Diagnosis not present

## 2019-12-26 DIAGNOSIS — I441 Atrioventricular block, second degree: Secondary | ICD-10-CM | POA: Diagnosis not present

## 2019-12-26 DIAGNOSIS — J4530 Mild persistent asthma, uncomplicated: Secondary | ICD-10-CM

## 2019-12-26 DIAGNOSIS — F0280 Dementia in other diseases classified elsewhere without behavioral disturbance: Secondary | ICD-10-CM

## 2019-12-26 NOTE — Assessment & Plan Note (Signed)
Stable in the office today, continue current regimen. 

## 2019-12-26 NOTE — Assessment & Plan Note (Addendum)
Progressing gradually, obvious today. Continue Namzaric as prescribed. Agree to take over care.  Will renew FMLA paperwork for her daughter.

## 2019-12-26 NOTE — Progress Notes (Signed)
Subjective:    Patient ID: [name], female    DOB: Apr 11, 1934, 86 y.o.   MRN: [id number]  HPI  This visit occurred during the SARS-CoV-2 public health emergency.  Safety protocols were in place, including screening questions prior to the visit, additional usage of staff PPE, and extensive cleaning of exam room while observing appropriate contact time as indicated for disinfecting solutions.   Ms. Paulsen is a 86 year old female with a history of AAA, CAD, hypertension, MVP, PAT, hypothyroidism, pacemaker in place, dementia, depression, CKD who presents today with her daughter who is needed FMLA renewed. Her daughter is providing all information for HPI.  The patient has a slowly progressing dementia, resides with her daughter and son in law. During the day she has a caregiver who can take her to appointments and supervises her at home, but there are times when the caregiver is not available. During these times her daughter will have to either take her to appointments or remain home. She is needing FMLA for work excuse so that she can take her mother to appointments or supervise as needed. Her mother cannot be by herself.   Patient is also due for her second Shingrix vaccine.  Patient's daughter does not wish to continue to follow with neurology or pulmonology given her mother's stable condition with asthma and overall gradual decline in memory. Memory and functional ability to care for herself is declining. She has to be reminded to eat, needs guidance to take care of her ADL's which she does participate.   The patient has no complaints today.   BP Readings from Last 3 Encounters:  12/26/19 120/70  11/07/19 124/62  09/03/19 112/66      Review of Systems  Respiratory: Negative for cough and shortness of breath.   Cardiovascular: Negative for chest pain.  Neurological: Negative for dizziness and headaches.       See HPI       Past Medical History:  Diagnosis Date  . AAA  (abdominal aortic aneurysm) ([place]) 01/03/13   3.5x3.3cm  . Alzheimer's dementia ([place])    "probably middle stage" (11/05/2014)  . Anemia    "hx of chronic" (11/05/2014)  . Aortic stenosis 03/29/2008   biological prosthetic replacement  . Arthritis    "joints" (11/05/2014)  . Asthma   . Chronic asthmatic bronchitis ([place])    "q fall and winter" (11/05/2014)  . Coronary artery disease    CABG 03/29/08  . Dyslipidemia   . Exertional shortness of breath   . Family history of adverse reaction to anesthesia    "daughter gets PONV too"  . Heart murmur   . Hypertension   . Hypothyroidism   . LBBB (left bundle branch block)   . Migraines    "none in years' (11/05/2014)  . Mobitz type 2 second degree AV block 10/31/2014  . MVP (mitral valve prolapse)    with mild mitral insufficiency/notes 08/17/2013  . PONV (postoperative nausea and vomiting)   . Presence of permanent cardiac pacemaker   . Sinus pause 10/31/2014  . Stroke [facility]) 03/2008   "2 light strokes"; denies residual on 11/05/2014  . Urinary frequency      Social History   Socioeconomic History  . Marital status: Divorced    Spouse name: Not on file  . Number of children: 1  . Years of education: Some college  . Highest education level: Not on file  Occupational History  . Occupation: Retired  Tobacco Use  .  Smoking status: Never Smoker  . Smokeless tobacco: Never Used  Substance and Sexual Activity  . Alcohol use: No    Alcohol/week: 0.0 standard drinks  . Drug use: No  . Sexual activity: Not Currently  Other Topics Concern  . 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 Difficulty of Paying Living Expenses: Not hard at all  Food Insecurity: No Food Insecurity  . Worried About  Programme researcher, broadcasting/film/video in the Last Year: Never true  . Ran Out of Food in the Last Year: Never true  Transportation Needs: No Transportation Needs  . Lack of Transportation (Medical): No  . Lack of Transportation (Non-Medical): No  Physical Activity: Inactive  . Days of Exercise per Week: 0 days  . Minutes of Exercise per Session: 0 min  Stress: No Stress Concern Present  . Feeling of Stress : Not at all  Social Connections:   . Frequency of Communication with Friends and Family: Not on file  . Frequency of Social Gatherings with Friends and Family: Not on file  . Attends Religious Services: Not on file  . Active Member of Clubs or Organizations: Not on file  . Attends Banker Meetings: Not on file  . Marital Status: Not on file  Intimate Partner Violence: Not At Risk  . Fear of Current or Ex-Partner: No  . Emotionally Abused: No  . Physically Abused: No  . Sexually Abused: No    Past Surgical History:  Procedure Laterality Date  . ABDOMINAL HYSTERECTOMY  1977  . AORTIC VALVE REPLACEMENT  03/29/2008   PERICARDIAL TISSUE VALVE  . APPENDECTOMY  ?1977  . CARDIAC CATHETERIZATION  ~ 2009  . CATARACT EXTRACTION W/ INTRAOCULAR LENS IMPLANT Left   . CORONARY ARTERY BYPASS GRAFT  03/29/2008   LIMA TO LAD,SVG TO CX,SVG TO LEFT POSTEROLATERAL BRANCH  . HIP FRACTURE SURGERY Right 06/2010   "3 metal screws"   . INSERT / REPLACE / REMOVE PACEMAKER  11/05/2014  . LOOP RECORDER EXPLANT N/A 11/05/2014   Procedure: LOOP RECORDER EXPLANT;  Surgeon: [name], MD;  Location: [facility];  Service: Cardiovascular;  Laterality: N/A;  . LOOP RECORDER IMPLANT N/A 09/17/2013   Procedure: LOOP RECORDER IMPLANT;  Surgeon: [name], MD;  Location: [facility];  Service: Cardiovascular;  Laterality: N/A;  . PERMANENT PACEMAKER INSERTION N/A 11/05/2014   Procedure: PERMANENT PACEMAKER INSERTION;  Surgeon: [name], MD;  Location: [facility];  Service: Cardiovascular;  Laterality: N/A;   . REFRACTIVE SURGERY Bilateral    [name] 04/28/2008 (08/17/2013)  . RETINAL DETACHMENT SURGERY Left 1994   [name] 04/10/2008 (08/17/2013)  . TIBIAL TUBERCLERPLASTY  11/05/2014  . tooth pulled   06/2018  . TOTAL HIP ARTHROPLASTY Right 07/31/2018   Procedure: RIGHT TOTAL HIP ARTHROPLASTY ANTERIOR APPROACH;  Surgeon: Gean Birchwood, MD;  Location: [facility];  Service: Orthopedics;  Laterality: Right;  . UTERINE FIBROID SURGERY  1960's    Family History  Problem Relation Age of Onset  . Heart failure Mother   . Diabetes Mother   . Hypertension Mother   . Hyperlipidemia Mother   . Melanoma Grandchild   . Colon cancer Neg Hx     Allergies  Allergen Reactions  . Codeine Nausea And Vomiting and Other (See Comments)    Patient gets violently ill  . Morphine And Related Nausea And Vomiting and Other (See Comments)    Patient get violently ill    Current Outpatient Medications on File Prior to Visit  Medication Sig Dispense Refill  . alendronate (FOSAMAX) 70 MG tablet TAKE 1 TAB BY MOUTH ONCE A WEEK. ON SUNDAYS, TAKE WITH A FULL GLASS OF WATER ON AN EMPTY STOMACH. 12 tablet 3  . aspirin EC 81 MG tablet Take 1 tablet (81 mg total) by mouth 2 (two) times daily. 60 tablet 0  . atorvastatin (LIPITOR) 40 MG tablet TAKE 1 TABLET BY MOUTH EVERY DAY IN THE EVENING FOR CHOLESTEROL 90 tablet 1  . brimonidine (ALPHAGAN) 0.2 % ophthalmic solution Place 1 drop into the left eye 2 (two) times daily.  3  . cetirizine (ZYRTEC) 10 MG chewable tablet Chew 10 mg by mouth daily.    Marland Kitchen FLUoxetine (PROZAC) 10 MG capsule TAKE 1 CAPSULE (10 MG TOTAL) BY MOUTH DAILY FOR ANXIETY AND DEPRESSION 90 capsule 1  . fluticasone (FLONASE) 50 MCG/ACT nasal spray Place 1 spray into both nostrils daily. 16 g 2  . furosemide (LASIX) 80 MG tablet Please take 80 mg once a day. 90 tablet 3  . levothyroxine (SYNTHROID) 50 MCG tablet TAKE 1 TABLET BY MOUTH EVERY DAY BEFORE BREAKFAST 90 tablet 1  . losartan (COZAAR) 50 MG tablet TAKE 2  TABLETS (100 MG TOTAL) BY MOUTH DAILY. FOR BLOOD PRESSURE 180 tablet 0  . montelukast (SINGULAIR) 10 MG tablet TAKE 1 TABLET BY MOUTH EVERYDAY AT BEDTIME 90 tablet 0  . NAMZARIC 28-10 MG CP24 TAKE 1 CAPSULE BY MOUTH EVERY DAY 90 capsule 3  . potassium chloride (K-DUR) 10 MEQ tablet Take 20 mEq (2 tablets) in the morning, 3 tablets (30 mEq) in the afternoon, and 3 tablets (30 mEq) in the evening. 240 tablet 6  . WIXELA INHUB 250-50 MCG/DOSE AEPB INHALE 1 PUFF INTO THE LUNGS 2 (TWO) TIMES DAILY. FOR COPD 180 each 1  . amoxicillin (AMOXIL) 500 MG tablet Take 2,000 mg by mouth See admin instructions. Take 2,000mg  by mouth 1 hour prior to dental appointments  1   No current facility-administered medications on file prior to visit.    BP 120/70   Pulse 62   Temp (!) 97.2 F (36.2 C) (Temporal)   Ht 5' 7.5" (1.715 m)   Wt 157 lb 8 oz (71.4 kg)   SpO2 97%   BMI 24.30 kg/m    Objective:   Physical Exam  Constitutional: She appears well-nourished.  Cardiovascular: Normal rate and regular rhythm.  Respiratory: Effort normal and breath sounds normal.  Musculoskeletal:     Cervical back: Neck supple.  Neurological: She is alert.  Follows commands, does not know information for HPI.  Skin: Skin is warm and dry.  Psychiatric: She has a normal mood and affect.           Assessment & Plan:

## 2019-12-26 NOTE — Assessment & Plan Note (Signed)
Asymptomatic. Continue statin therapy. Check LDL next visit.

## 2019-12-26 NOTE — Assessment & Plan Note (Signed)
Followed by EP 

## 2019-12-26 NOTE — Patient Instructions (Signed)
Please schedule a follow up appointment in 6 months for your annual physical.   It was a pleasure to see you today!   

## 2019-12-26 NOTE — Assessment & Plan Note (Signed)
Pacemaker in place. Following with EP and cardiology.

## 2019-12-26 NOTE — Assessment & Plan Note (Addendum)
Stable and doing well on current regimen. Will continue to refill inhalers. She will no longer follow up with pulmonology per daughters request.

## 2019-12-29 ENCOUNTER — Other Ambulatory Visit: Payer: Self-pay | Admitting: Cardiovascular Disease

## 2019-12-31 ENCOUNTER — Ambulatory Visit (INDEPENDENT_AMBULATORY_CARE_PROVIDER_SITE_OTHER): Payer: Medicare Other | Admitting: Cardiovascular Disease

## 2019-12-31 ENCOUNTER — Encounter: Payer: Self-pay | Admitting: Cardiovascular Disease

## 2019-12-31 ENCOUNTER — Other Ambulatory Visit: Payer: Self-pay

## 2019-12-31 ENCOUNTER — Telehealth: Payer: Self-pay | Admitting: Primary Care

## 2019-12-31 VITALS — BP 118/72 | HR 61 | Ht 69.0 in | Wt 158.6 lb

## 2019-12-31 DIAGNOSIS — I5032 Chronic diastolic (congestive) heart failure: Secondary | ICD-10-CM

## 2019-12-31 DIAGNOSIS — I341 Nonrheumatic mitral (valve) prolapse: Secondary | ICD-10-CM

## 2019-12-31 DIAGNOSIS — I471 Supraventricular tachycardia: Secondary | ICD-10-CM | POA: Diagnosis not present

## 2019-12-31 DIAGNOSIS — I2510 Atherosclerotic heart disease of native coronary artery without angina pectoris: Secondary | ICD-10-CM | POA: Diagnosis not present

## 2019-12-31 DIAGNOSIS — Z953 Presence of xenogenic heart valve: Secondary | ICD-10-CM | POA: Diagnosis not present

## 2019-12-31 DIAGNOSIS — I739 Peripheral vascular disease, unspecified: Secondary | ICD-10-CM

## 2019-12-31 DIAGNOSIS — I714 Abdominal aortic aneurysm, without rupture, unspecified: Secondary | ICD-10-CM

## 2019-12-31 DIAGNOSIS — Z950 Presence of cardiac pacemaker: Secondary | ICD-10-CM

## 2019-12-31 DIAGNOSIS — E7800 Pure hypercholesterolemia, unspecified: Secondary | ICD-10-CM

## 2019-12-31 DIAGNOSIS — I10 Essential (primary) hypertension: Secondary | ICD-10-CM

## 2019-12-31 NOTE — Telephone Encounter (Signed)
fmla paperwork in [employer] in box for review and signature °

## 2019-12-31 NOTE — Telephone Encounter (Signed)
Completed and handed to Robin. °

## 2019-12-31 NOTE — Progress Notes (Signed)
Cardiology office note     Date:  01/01/2020   ID:  [name], DOB December 26, 1933, MRN [id number]  PCP:  Doreene Nest, NP  Cardiologist:  [name], MD  Electrophysiologist:  None   Evaluation Performed:  Follow-Up Visit diastolic heart failure, s/p AVR and pacemaker  Chief Complaint: CHF  History of Present Illness:     As with previous visits the patient's daughter, [name], participated in the visit, due to the patient's short-term memory issues.  [name] is a 86 y.o. female with chronic heart failure with preserved left ventricular ejection fraction, history of aortic valve replacement with a biological prosthesis, mild mitral valve prolapse with mild mitral insufficiency, remote history of syncope due to Mobitz type II second-degree AV block with implantation of a dual-chamber permanent pacemaker (Medtronic, 2016), small AAA.  She is done better since her last appointment.  She uses a walker and has not had any serious falls.  She takes 80 mg of furosemide daily and has not had issues with edema orthopnea or PND.  She does not appear to have any breathing limitations with activity in the house, although her daughter points out that she huffs and puffs when she takes a bath.  She has not had problems with orthostatic hypotension symptoms recently.  She denies angina or any new focal neurological complaints.  Her memory issues are status quo.  She has not had any serious bleeding problems.  Her weight has been very steady at 152-153 pounds on her home scale, our office scale overestimates this by about 5 pounds.   Comprehensive pacemaker interrogation on November 12, 2019 shows normal pacemaker function and extremely low burden of atrial fibrillation and no significant recent episodes of atrial fibrillation.  She has 23% atrial pacing and virtually no ventricular pacing.  She is not pacemaker dependent.  Normal lead and generator parameters, estimated longevity  over 12 years.  Although the device was implanted for second-degree AV block she almost never paces the ventricle.   The aortic valve is a 21 mm Edwards MagnaEase bovine bioprosthesis placed in 2009. Her echocardiogram was last performed in July 2020 and shows normal left ventricular systolic function and normal gradients across the aortic valve biological prosthesis (Mean gradient 12 mmHg, dimensionless index 0.47).  Her dual-chamber Medtronic Adapta permanent pacemaker was implanted in 2016 for sinus pauses and second-degree AV block.  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  The patient does not have symptoms concerning for COVID-19 infection (fever, chills, cough, or new shortness of breath).    Past Medical History:  Diagnosis Date  . AAA (abdominal aortic aneurysm) (HCC) 01/03/13   3.5x3.3cm  . Alzheimer's dementia (HCC)    "probably middle stage" (11/05/2014)  . Anemia    "hx of chronic" (11/05/2014)  . Aortic stenosis 03/29/2008   biological prosthetic replacement  . Arthritis    "joints" (11/05/2014)  . Asthma   . Chronic asthmatic bronchitis (HCC)    "q fall and winter" (11/05/2014)  . Coronary artery disease    CABG 03/29/08  . Dyslipidemia   . Exertional shortness of breath   . Family history of adverse reaction to anesthesia    "daughter gets PONV too"  . Heart murmur   . Hypertension   . Hypothyroidism   . LBBB (left bundle branch block)   . Migraines    "none in years' (11/05/2014)  . Mobitz type 2 second degree AV block 10/31/2014  .  MVP (mitral valve prolapse)    with mild mitral insufficiency/notes 08/17/2013  . PONV (postoperative nausea and vomiting)   . Presence of permanent cardiac pacemaker   . Sinus pause 10/31/2014  . Stroke [facility]) 03/2008   "2 light strokes"; denies residual on 11/05/2014  . Urinary frequency    Past Surgical History:  Procedure Laterality Date  . ABDOMINAL HYSTERECTOMY  1977  . AORTIC VALVE REPLACEMENT  03/29/2008     PERICARDIAL TISSUE VALVE  . APPENDECTOMY  ?1977  . CARDIAC CATHETERIZATION  ~ 2009  . CATARACT EXTRACTION W/ INTRAOCULAR LENS IMPLANT Left   . CORONARY ARTERY BYPASS GRAFT  03/29/2008   LIMA TO LAD,SVG TO CX,SVG TO LEFT POSTEROLATERAL BRANCH  . HIP FRACTURE SURGERY Right 06/2010   "3 metal screws"   . INSERT / REPLACE / REMOVE PACEMAKER  11/05/2014  . LOOP RECORDER EXPLANT N/A 11/05/2014   Procedure: LOOP RECORDER EXPLANT;  Surgeon: [name], MD;  Location: [facility];  Service: Cardiovascular;  Laterality: N/A;  . LOOP RECORDER IMPLANT N/A 09/17/2013   Procedure: LOOP RECORDER IMPLANT;  Surgeon: [name], MD;  Location: [facility];  Service: Cardiovascular;  Laterality: N/A;  . PERMANENT PACEMAKER INSERTION N/A 11/05/2014   Procedure: PERMANENT PACEMAKER INSERTION;  Surgeon: [name], MD;  Location: [facility];  Service: Cardiovascular;  Laterality: N/A;  . REFRACTIVE SURGERY Bilateral    [name] 04/28/2008 (08/17/2013)  . RETINAL DETACHMENT SURGERY Left 1994   [name] 04/10/2008 (08/17/2013)  . TIBIAL TUBERCLERPLASTY  11/05/2014  . tooth pulled   06/2018  . TOTAL HIP ARTHROPLASTY Right 07/31/2018   Procedure: RIGHT TOTAL HIP ARTHROPLASTY ANTERIOR APPROACH;  Surgeon: Gean Birchwood, MD;  Location: [facility];  Service: Orthopedics;  Laterality: Right;  . UTERINE FIBROID SURGERY  1960's     Current Meds  Medication Sig  . alendronate (FOSAMAX) 70 MG tablet TAKE 1 TAB BY MOUTH ONCE A WEEK. ON SUNDAYS, TAKE WITH A FULL GLASS OF WATER ON AN EMPTY STOMACH.  Marland Kitchen amoxicillin (AMOXIL) 500 MG tablet Take 2,000 mg by mouth See admin instructions. Take 2,000mg  by mouth 1 hour prior to dental appointments  . aspirin EC 81 MG tablet Take 1 tablet (81 mg total) by mouth 2 (two) times daily.  Marland Kitchen atorvastatin (LIPITOR) 40 MG tablet TAKE 1 TABLET BY MOUTH EVERY DAY IN THE EVENING FOR CHOLESTEROL  . brimonidine (ALPHAGAN) 0.2 % ophthalmic solution Place 1 drop into the left eye 2 (two) times daily.   . cetirizine (ZYRTEC) 10 MG chewable tablet Chew 10 mg by mouth daily.  Marland Kitchen FLUoxetine (PROZAC) 10 MG capsule TAKE 1 CAPSULE (10 MG TOTAL) BY MOUTH DAILY FOR ANXIETY AND DEPRESSION  . fluticasone (FLONASE) 50 MCG/ACT nasal spray Place 1 spray into both nostrils daily.  . furosemide (LASIX) 80 MG tablet Please take 80 mg once a day.  . levothyroxine (SYNTHROID) 50 MCG tablet TAKE 1 TABLET BY MOUTH EVERY DAY BEFORE BREAKFAST  . losartan (COZAAR) 50 MG tablet TAKE 2 TABLETS (100 MG TOTAL) BY MOUTH DAILY. FOR BLOOD PRESSURE  . montelukast (SINGULAIR) 10 MG tablet TAKE 1 TABLET BY MOUTH EVERYDAY AT BEDTIME  . NAMZARIC 28-10 MG CP24 TAKE 1 CAPSULE BY MOUTH EVERY DAY  . WIXELA INHUB 250-50 MCG/DOSE AEPB INHALE 1 PUFF INTO THE LUNGS 2 (TWO) TIMES DAILY. FOR COPD  . [DISCONTINUED] potassium chloride (K-DUR) 10 MEQ tablet Take 20 mEq (2 tablets) in the morning, 3 tablets (30 mEq) in the afternoon, and 3 tablets (30 mEq)  in the evening.     Allergies:   Codeine and Morphine and related   Social History   Tobacco Use  . Smoking status: Never Smoker  . Smokeless tobacco: Never Used  Substance Use Topics  . Alcohol use: No    Alcohol/week: 0.0 standard drinks  . Drug use: No     Family Hx: The patient's family history includes Diabetes in her mother; Heart failure in her mother; Hyperlipidemia in her mother; Hypertension in her mother; Melanoma in her grandchild. There is no history of Colon cancer.  ROS:   Please see the history of present illness.    All other systems are reviewed and are negative.   Prior CV studies:   The following studies were reviewed today: January pacemaker download,   Echo July 2020 1. The left ventricle has normal systolic function with an ejection  fraction of 60-65%. The cavity size was normal. Left ventricular diastolic  Doppler parameters are consistent with pseudonormalization. Elevated mean  left atrial pressure No evidence of  left ventricular regional  wall motion abnormalities.  2. The right ventricle has normal systolic function. The cavity was  normal. There is no increase in right ventricular wall thickness. Right  ventricular systolic pressure is normal with an estimated pressure of 21.7  mmHg.  3. Left atrial size was moderately dilated.  4. A pericardial bioprosthesis valve is present in the aortic position.  Procedure Date: 03/29/2008.  5. When compared to the prior study: 01/26/2017, left ventricular  systolic function and aortic valve gradients are in normal range and  stable. There continues to be evidence of increased left atrial pressure.   Labs/Other Tests and Data Reviewed:    EKG:   ECG ordered today shows normal sinus rhythm, right bundle branch block and left anterior fascicular block similar to previous tracings. Recent Labs: 05/30/2019: BNP 157.8 06/26/2019: ALT 14; Hemoglobin 13.2; Platelets 156.0; TSH 0.86 07/03/2019: BUN 22; Creatinine, Ser 1.24; Potassium 4.3; Sodium 141   Recent Lipid Panel Lab Results  Component Value Date/Time   CHOL 128 06/26/2019 12:49 PM   TRIG 194.0 (H) 06/26/2019 12:49 PM   HDL 53.20 06/26/2019 12:49 PM   CHOLHDL 2 06/26/2019 12:49 PM   LDLCALC 36 06/26/2019 12:49 PM    Wt Readings from Last 3 Encounters:  12/31/19 158 lb 9.6 oz (71.9 kg)  12/26/19 157 lb 8 oz (71.4 kg)  11/07/19 157 lb (71.2 kg)     Objective:    Vital Signs:  BP 118/72   Pulse 61   Ht 5\' 9"  (1.753 m)   Wt 158 lb 9.6 oz (71.9 kg)   SpO2 97%   BMI 23.42 kg/m     General: Alert, oriented x3, no distress, lean, appears comfortable.  Healthy left subclavian pacemaker site Head: no evidence of trauma, PERRL, EOMI, no exophtalmos or lid lag, no myxedema, no xanthelasma; normal ears, nose and oropharynx Neck: normal jugular venous pulsations and no hepatojugular reflux; brisk carotid pulses without delay and no carotid bruits Chest: clear to auscultation, no signs of consolidation by percussion or palpation,  normal fremitus, symmetrical and full respiratory excursions Cardiovascular: normal position and quality of the apical impulse, regular rhythm, normal first and second heart sounds, no murmurs, rubs or gallops Abdomen: no tenderness or distention, no masses by palpation, no abnormal pulsatility or arterial bruits, normal bowel sounds, no hepatosplenomegaly Extremities: no clubbing, cyanosis or edema; 2+ radial, ulnar and brachial pulses bilaterally; 2+ right femoral, posterior tibial and dorsalis pedis  pulses; 2+ left femoral, posterior tibial and dorsalis pedis pulses; no subclavian or femoral bruits Neurological: grossly nonfocal Psych: Normal mood and affect    ASSESSMENT & PLAN:    1. Chronic diastolic heart failure (HCC)   2. PAT (paroxysmal atrial tachycardia) (HCC)   3. S/P aortic valve replacement with bioprosthetic valve   4. Coronary artery disease involving native coronary artery of native heart without angina pectoris   5. MVP (mitral valve prolapse) with mild mitral insufficiency   6. Pacemaker   7. AAA (abdominal aortic aneurysm) without rupture (HCC)   8. PAD (peripheral artery disease) (HCC)   9. Hypercholesterolemia   10. Essential hypertension      1. CHF: Seems to be well compensated on furosemide 80 mg daily, clinically euvolemic without edema or symptoms of orthostatic hypotension.  Ideal home weight appears to be around 153 pounds.  Preserved left ventricular systolic function. 2. Hx AVR: Normal prosthetic valve gradients, last evaluated by echo in July 2020 3. CAD s/p CABG: He does not have angina pectoris 4. MVP/MR: Not yet hemodynamically significant.  Appeared even less of an issue on the most recent echocardiogram 5. PAT/AFib: He is not on anticoagulation since the burden of true atrial fibrillation is extremely low he has never had stroke or TIA or other embolic events, at the same time being at increased risk of bleeding due to age and frequent falls.  Using  the pacemaker to monitor for increasing burden of atrial fibrillation, where we may have to reevaluate the benefit of anticoagulation.  Avoiding beta blockers due to history of reactive airway disease. 6. PM: Normal device function, continue remote downloads every 3 months. 7. AAA: Relatively small with a maximum diameter of 3.7 cm, last evaluated by ultrasound in September 2018.  Neither the patient nor her daughter [name] seem to particularly interested in the idea of elective aneurysm treatment so will not perform yearly checks. 8. PAD: She has some stenosis of the mid aorta and 50% bilateral common iliac artery stenosis.  She does not have claudication, possibly because she is now more sedentary. 9. HLP: On statin with excellent LDL cholesterol last checked 10. HTN: Blood pressure is optimal range.  She does not have symptoms of heart static hypotension anymore.   COVID-19 Education: The signs and symptoms of COVID-19 were discussed with the patient and how to seek care for testing (follow up with PCP or arrange E-visit).  The importance of social distancing was discussed today.   Medication Adjustments/Labs and Tests Ordered: Current medicines are reviewed at length with the patient today.  Concerns regarding medicines are outlined above.   Tests Ordered: Orders Placed This Encounter  Procedures  . EKG 12-Lead    Medication Changes: No orders of the defined types were placed in this encounter.  Patient Instructions  Medication Instructions:  No changes *If you need a refill on your cardiac medications before your next appointment, please call your pharmacy*   Lab Work: None ordered If you have labs (blood work) drawn today and your tests are completely normal, you will receive your results only by: Marland Kitchen MyChart Message (if you have MyChart) OR . A paper copy in the mail If you have any lab test that is abnormal or we need to change your treatment, we will call you to review the  results.   Testing/Procedures: None ordered   Follow-Up: At [facility], you and your health needs are our priority.  As part of our continuing mission to  provide you with exceptional heart care, we have created designated Provider Care Teams.  These Care Teams include your primary Cardiologist (physician) and Advanced Practice Providers (APPs -  Physician Assistants and Nurse Practitioners) who all work together to provide you with the care you need, when you need it.  We recommend signing up for the patient portal called "MyChart".  Sign up information is provided on this After Visit Summary.  MyChart is used to connect with patients for Virtual Visits (Telemedicine).  Patients are able to view lab/test results, encounter notes, upcoming appointments, etc.  Non-urgent messages can be sent to your provider as well.   To learn more about what you can do with MyChart, go to [email or web address].    Your next appointment:   6 month(s)  The format for your next appointment:   In Person  Provider:   [name], MD       Disposition:  Follow up 3 months  Signed, [name], MD  01/01/2020 4:45 PM    [facility]

## 2019-12-31 NOTE — Patient Instructions (Signed)

## 2020-01-01 NOTE — Telephone Encounter (Signed)
Michele aware  Copy for pt Copy for scan

## 2020-01-01 NOTE — Telephone Encounter (Signed)
Paperwork faxed °

## 2020-01-26 ENCOUNTER — Other Ambulatory Visit: Payer: Self-pay | Admitting: Primary Care

## 2020-01-26 DIAGNOSIS — J449 Chronic obstructive pulmonary disease, unspecified: Secondary | ICD-10-CM

## 2020-01-26 DIAGNOSIS — R059 Cough, unspecified: Secondary | ICD-10-CM

## 2020-01-26 DIAGNOSIS — R05 Cough: Secondary | ICD-10-CM

## 2020-01-27 ENCOUNTER — Other Ambulatory Visit: Payer: Self-pay | Admitting: Primary Care

## 2020-01-27 DIAGNOSIS — J449 Chronic obstructive pulmonary disease, unspecified: Secondary | ICD-10-CM

## 2020-02-01 ENCOUNTER — Ambulatory Visit (INDEPENDENT_AMBULATORY_CARE_PROVIDER_SITE_OTHER): Payer: Medicare Other | Admitting: *Deleted

## 2020-02-01 DIAGNOSIS — I471 Supraventricular tachycardia: Secondary | ICD-10-CM

## 2020-02-02 LAB — CUP PACEART REMOTE DEVICE CHECK
Battery Impedance: 228 Ohm
Battery Remaining Longevity: 144 mo
Battery Voltage: 2.78 V
Brady Statistic AP VP Percent: 0 %
Brady Statistic AP VS Percent: 23 %
Brady Statistic AS VP Percent: 0 %
Brady Statistic AS VS Percent: 76 %
Date Time Interrogation Session: 20210409231907
Implantable Lead Implant Date: 20160112
Implantable Lead Implant Date: 20160112
Implantable Lead Location: 753859
Implantable Lead Location: 753860
Implantable Lead Model: 5076
Implantable Lead Model: 5076
Implantable Pulse Generator Implant Date: 20160112
Lead Channel Impedance Value: 493 Ohm
Lead Channel Impedance Value: 512 Ohm
Lead Channel Pacing Threshold Amplitude: 0.625 V
Lead Channel Pacing Threshold Amplitude: 0.75 V
Lead Channel Pacing Threshold Pulse Width: 0.4 ms
Lead Channel Pacing Threshold Pulse Width: 0.4 ms
Lead Channel Setting Pacing Amplitude: 1.5 V
Lead Channel Setting Pacing Amplitude: 2 V
Lead Channel Setting Pacing Pulse Width: 0.4 ms
Lead Channel Setting Sensing Sensitivity: 2 mV

## 2020-02-10 ENCOUNTER — Other Ambulatory Visit: Payer: Self-pay | Admitting: Primary Care

## 2020-02-10 DIAGNOSIS — E785 Hyperlipidemia, unspecified: Secondary | ICD-10-CM

## 2020-02-27 NOTE — Telephone Encounter (Signed)
Please print off new med list and attach to [facility] form.

## 2020-02-27 NOTE — Telephone Encounter (Signed)
Done

## 2020-03-01 ENCOUNTER — Other Ambulatory Visit: Payer: Self-pay | Admitting: Primary Care

## 2020-03-01 DIAGNOSIS — E039 Hypothyroidism, unspecified: Secondary | ICD-10-CM

## 2020-03-17 ENCOUNTER — Ambulatory Visit (INDEPENDENT_AMBULATORY_CARE_PROVIDER_SITE_OTHER): Payer: Medicare Other | Admitting: Primary Care

## 2020-03-17 ENCOUNTER — Other Ambulatory Visit: Payer: Self-pay

## 2020-03-17 ENCOUNTER — Encounter: Payer: Self-pay | Admitting: Primary Care

## 2020-03-17 VITALS — BP 118/74 | HR 78 | Temp 95.9°F | Ht 67.5 in | Wt 157.0 lb

## 2020-03-17 DIAGNOSIS — R350 Frequency of micturition: Secondary | ICD-10-CM | POA: Insufficient documentation

## 2020-03-17 LAB — POC URINALSYSI DIPSTICK (AUTOMATED)
Bilirubin, UA: NEGATIVE
Blood, UA: NEGATIVE
Glucose, UA: NEGATIVE
Ketones, UA: NEGATIVE
Leukocytes, UA: NEGATIVE
Nitrite, UA: NEGATIVE
Protein, UA: NEGATIVE
Spec Grav, UA: 1.015 (ref 1.010–1.025)
Urobilinogen, UA: 0.2 E.U./dL
pH, UA: 6 (ref 5.0–8.0)

## 2020-03-17 NOTE — Assessment & Plan Note (Addendum)
Acute for the last week, also with some behavorial changes and foul smelling urine.   UA today negative. Will send culture given symptoms.  Await results.   She appears stable for treatment.

## 2020-03-17 NOTE — Progress Notes (Signed)
Subjective:    Patient ID: [name], female    DOB: 04-Feb-1934, 86 y.o.   MRN: [id number]  HPI  This visit occurred during the SARS-CoV-2 public health emergency.  Safety protocols were in place, including screening questions prior to the visit, additional usage of staff PPE, and extensive cleaning of exam room while observing appropriate contact time as indicated for disinfecting solutions.   [name] is a 86 year old female with a history of asthma, hypothyroidism, dementia, CKD, heart block with pacemaker insertion, urinary incontinence, who presents today with her daughter for a chief complaint of urinary frequency. Her daughter supplies most all of the information for HPI typically due to her dementia.   She also reports foul smelling urine. Her daughter has noticed that she is more argumentative/defensive over the last 1-2 weeks, sleeping more than usual. Symptoms began about one week ago. She does drink water, diet soda, tea throughout the day, but her daughter doesn't believe she drinks much during the day. She does wear pads during the day for urinary incontinence.   BP Readings from Last 3 Encounters:  03/17/20 118/74  12/31/19 118/72  12/26/19 120/70     Review of Systems  Constitutional: Negative for fever.  Genitourinary: Positive for frequency. Negative for flank pain and hematuria.       Foul smelling urine       Past Medical History:  Diagnosis Date  . AAA (abdominal aortic aneurysm) (HCC) 01/03/13   3.5x3.3cm  . Alzheimer's dementia (HCC)    "probably middle stage" (11/05/2014)  . Anemia    "hx of chronic" (11/05/2014)  . Aortic stenosis 03/29/2008   biological prosthetic replacement  . Arthritis    "joints" (11/05/2014)  . Asthma   . Chronic asthmatic bronchitis (HCC)    "q fall and winter" (11/05/2014)  . Coronary artery disease    CABG 03/29/08  . Dyslipidemia   . Exertional shortness of breath   . Family history of adverse reaction to anesthesia      "daughter gets PONV too"  . Heart murmur   . Hypertension   . Hypothyroidism   . LBBB (left bundle branch block)   . Migraines    "none in years' (11/05/2014)  . Mobitz type 2 second degree AV block 10/31/2014  . MVP (mitral valve prolapse)    with mild mitral insufficiency/notes 08/17/2013  . PONV (postoperative nausea and vomiting)   . Presence of permanent cardiac pacemaker   . Sinus pause 10/31/2014  . Stroke [facility]) 03/2008   "2 light strokes"; denies residual on 11/05/2014  . Urinary frequency      Social History   Socioeconomic History  . Marital status: Divorced    Spouse name: Not on file  . Number of children: 1  . Years of education: Some college  . Highest education level: Not on file  Occupational History  . Occupation: Retired  Tobacco Use  . Smoking status: Never Smoker  . Smokeless tobacco: Never Used  Substance and Sexual Activity  . Alcohol use: No    Alcohol/week: 0.0 standard drinks  . Drug use: No  . Sexual activity: Not Currently  Other Topics Concern  . 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 Difficulty of Paying Living Expenses: Not hard at all  Food Insecurity: No Food Insecurity  . Worried About Programme researcher, broadcasting/film/video in the Last Year: Never true  . Ran Out of Food in the Last Year: Never true  Transportation Needs: No Transportation Needs  . Lack of Transportation (Medical): No  . Lack of Transportation (Non-Medical): No  Physical Activity: Inactive  . Days of Exercise per Week: 0 days  . Minutes of Exercise per Session: 0 min  Stress: No Stress Concern Present  . Feeling of Stress : Not at all  Social Connections:   . Frequency of Communication with Friends and Family:   . Frequency of Social  Gatherings with Friends and Family:   . Attends Religious Services:   . Active Member of Clubs or Organizations:   . Attends Banker Meetings:   Marland Kitchen Marital Status:   Intimate Partner Violence: Not At Risk  . Fear of Current or Ex-Partner: No  . Emotionally Abused: No  . Physically Abused: No  . Sexually Abused: No    Past Surgical History:  Procedure Laterality Date  . ABDOMINAL HYSTERECTOMY  1977  . AORTIC VALVE REPLACEMENT  03/29/2008   PERICARDIAL TISSUE VALVE  . APPENDECTOMY  ?1977  . CARDIAC CATHETERIZATION  ~ 2009  . CATARACT EXTRACTION W/ INTRAOCULAR LENS IMPLANT Left   . CORONARY ARTERY BYPASS GRAFT  03/29/2008   LIMA TO LAD,SVG TO CX,SVG TO LEFT POSTEROLATERAL BRANCH  . HIP FRACTURE SURGERY Right 06/2010   "3 metal screws"   . INSERT / REPLACE / REMOVE PACEMAKER  11/05/2014  . LOOP RECORDER EXPLANT N/A 11/05/2014   Procedure: LOOP RECORDER EXPLANT;  Surgeon: [name], MD;  Location: [facility];  Service: Cardiovascular;  Laterality: N/A;  . LOOP RECORDER IMPLANT N/A 09/17/2013   Procedure: LOOP RECORDER IMPLANT;  Surgeon: [name], MD;  Location: [facility];  Service: Cardiovascular;  Laterality: N/A;  . PERMANENT PACEMAKER INSERTION N/A 11/05/2014   Procedure: PERMANENT PACEMAKER INSERTION;  Surgeon: [name], MD;  Location: [facility];  Service: Cardiovascular;  Laterality: N/A;  . REFRACTIVE SURGERY Bilateral    [name] 04/28/2008 (08/17/2013)  . RETINAL DETACHMENT SURGERY Left 1994   [name] 04/10/2008 (08/17/2013)  . TIBIAL TUBERCLERPLASTY  11/05/2014  . tooth pulled   06/2018  . TOTAL HIP ARTHROPLASTY Right 07/31/2018   Procedure: RIGHT TOTAL HIP ARTHROPLASTY ANTERIOR APPROACH;  Surgeon: Gean Birchwood, MD;  Location: [facility];  Service: Orthopedics;  Laterality: Right;  . UTERINE FIBROID SURGERY  1960's    Family History  Problem Relation Age of Onset  . Heart failure Mother   . Diabetes Mother   . Hypertension Mother   . Hyperlipidemia  Mother   . Melanoma Grandchild   . Colon cancer Neg Hx     Allergies  Allergen Reactions  . Codeine Nausea And Vomiting and Other (See Comments)    Patient gets violently ill  . Morphine And Related Nausea And Vomiting and Other (See Comments)    Patient get violently ill    Current Outpatient Medications on File Prior to Visit  Medication Sig Dispense Refill  . alendronate (FOSAMAX) 70 MG tablet TAKE 1 TAB BY MOUTH ONCE A WEEK. ON SUNDAYS, TAKE WITH A FULL GLASS OF WATER ON AN EMPTY STOMACH. 12 tablet 3  . amoxicillin (AMOXIL) 500 MG tablet Take 2,000 mg by mouth See admin instructions. Take 2,000mg  by mouth 1 hour prior to  dental appointments  1  . aspirin EC 81 MG tablet Take 1 tablet (81 mg total) by mouth 2 (two) times daily. 60 tablet 0  . atorvastatin (LIPITOR) 40 MG tablet TAKE 1 TABLET BY MOUTH EVERY DAY IN THE EVENING FOR CHOLESTEROL 90 tablet 1  . brimonidine (ALPHAGAN) 0.2 % ophthalmic solution Place 1 drop into the left eye 2 (two) times daily.  3  . cetirizine (ZYRTEC) 10 MG chewable tablet Chew 10 mg by mouth daily.    Marland Kitchen FLUoxetine (PROZAC) 10 MG capsule TAKE 1 CAPSULE (10 MG TOTAL) BY MOUTH DAILY FOR ANXIETY AND DEPRESSION 90 capsule 1  . fluticasone (FLONASE) 50 MCG/ACT nasal spray Place 1 spray into both nostrils daily. 16 g 2  . furosemide (LASIX) 80 MG tablet Please take 80 mg once a day. 90 tablet 3  . KLOR-CON M10 10 MEQ tablet TAKE 2 TABS IN THE MORNING, 3 TABS IN THE AFTERNOON, AND 3 TABS IN THE EVENING.    Marland Kitchen levothyroxine (SYNTHROID) 50 MCG tablet TAKE 1 TABLET BY MOUTH EVERY DAY BEFORE BREAKFAST 90 tablet 1  . losartan (COZAAR) 50 MG tablet TAKE 2 TABLETS (100 MG TOTAL) BY MOUTH DAILY. FOR BLOOD PRESSURE 180 tablet 1  . montelukast (SINGULAIR) 10 MG tablet TAKE 1 TABLET BY MOUTH EVERYDAY AT BEDTIME 90 tablet 1  . NAMZARIC 28-10 MG CP24 TAKE 1 CAPSULE BY MOUTH EVERY DAY 90 capsule 3  . potassium chloride (KLOR-CON) 10 MEQ tablet TAKE 2 TABS IN THE MORNING, 3 TABS  IN THE AFTERNOON, AND 3 TABS IN THE EVENING. 720 tablet 2  . WIXELA INHUB 250-50 MCG/DOSE AEPB INHALE 1 PUFF INTO THE LUNGS 2 (TWO) TIMES DAILY. FOR COPD 180 each 1   No current facility-administered medications on file prior to visit.    BP 118/74   Pulse 78   Temp (!) 95.9 F (35.5 C) (Temporal)   Ht 5' 7.5" (1.715 m)   Wt 157 lb (71.2 kg)   SpO2 98%   BMI 24.23 kg/m    Objective:   Physical Exam  Constitutional: She appears well-nourished.  Cardiovascular: Normal rate and regular rhythm.  Respiratory: Effort normal and breath sounds normal.  Musculoskeletal:     Cervical back: Neck supple.  Skin: Skin is warm and dry.  Psychiatric: She has a normal mood and affect.           Assessment & Plan:

## 2020-03-17 NOTE — Patient Instructions (Signed)
We will be in touch with your urine culture results Wednesday this week.  Increase water consumption to at least 3 full [employer] daily.   It was a pleasure to see you today!

## 2020-03-18 LAB — URINE CULTURE
MICRO NUMBER:: 10511993
SPECIMEN QUALITY:: ADEQUATE

## 2020-04-01 ENCOUNTER — Other Ambulatory Visit: Payer: Self-pay

## 2020-04-01 ENCOUNTER — Ambulatory Visit (INDEPENDENT_AMBULATORY_CARE_PROVIDER_SITE_OTHER): Payer: Medicare Other

## 2020-04-01 DIAGNOSIS — Z111 Encounter for screening for respiratory tuberculosis: Secondary | ICD-10-CM

## 2020-04-01 NOTE — Progress Notes (Signed)
PPD Placement note [name], 86 y.o. female is here today for placement of PPD test Reason for PPD test: screening Pt taken PPD test before: yes Verified in allergy area and with patient that they are not allergic to the products PPD is made of (Phenol or Tween). No allergy Is patient taking any oral or IV steroid medication now or have they taken it in the last month? no Has the patient ever received the BCG vaccine?: no Has the patient been in recent contact with anyone known or suspected of having active TB disease?: no      Date of exposure (if applicable): N/A      Name of person they were exposed to (if applicable): N/A Patient's Country of origin?: [place] O: Alert and oriented in NAD. P:  PPD placed on 04/01/2020.  Patient advised to return for reading within 48-72 hours. Placed on R forearm. Pt has return visit for 6/10 @ 230

## 2020-04-03 ENCOUNTER — Other Ambulatory Visit: Payer: Self-pay

## 2020-04-03 ENCOUNTER — Ambulatory Visit: Payer: Medicare Other | Admitting: *Deleted

## 2020-04-03 DIAGNOSIS — Z111 Encounter for screening for respiratory tuberculosis: Secondary | ICD-10-CM

## 2020-04-03 LAB — TB SKIN TEST
Induration: 0 mm
TB Skin Test: NEGATIVE

## 2020-04-14 DIAGNOSIS — R531 Weakness: Secondary | ICD-10-CM | POA: Diagnosis not present

## 2020-04-15 ENCOUNTER — Telehealth: Payer: Self-pay | Admitting: Primary Care

## 2020-04-15 DIAGNOSIS — R531 Weakness: Secondary | ICD-10-CM | POA: Diagnosis not present

## 2020-04-15 NOTE — Telephone Encounter (Signed)
[name], with [facility]  called today She was following up on paperwork that was faxed over to [place] earlier this morning. She wanted to check status and make sure we received the papers to be filled out     C/B # [phone]

## 2020-04-15 NOTE — Telephone Encounter (Signed)
Called and nofitied papers were received. When completed, we will fax

## 2020-04-17 DIAGNOSIS — R531 Weakness: Secondary | ICD-10-CM | POA: Diagnosis not present

## 2020-04-21 DIAGNOSIS — R531 Weakness: Secondary | ICD-10-CM | POA: Diagnosis not present

## 2020-04-23 DIAGNOSIS — R531 Weakness: Secondary | ICD-10-CM | POA: Diagnosis not present

## 2020-04-26 ENCOUNTER — Other Ambulatory Visit: Payer: Self-pay | Admitting: Primary Care

## 2020-04-26 DIAGNOSIS — F3342 Major depressive disorder, recurrent, in full remission: Secondary | ICD-10-CM

## 2020-04-28 DIAGNOSIS — Z8781 Personal history of (healed) traumatic fracture: Secondary | ICD-10-CM | POA: Diagnosis not present

## 2020-04-28 DIAGNOSIS — M1611 Unilateral primary osteoarthritis, right hip: Secondary | ICD-10-CM | POA: Diagnosis not present

## 2020-04-28 DIAGNOSIS — N189 Chronic kidney disease, unspecified: Secondary | ICD-10-CM | POA: Diagnosis not present

## 2020-04-28 DIAGNOSIS — Z7982 Long term (current) use of aspirin: Secondary | ICD-10-CM | POA: Diagnosis not present

## 2020-04-28 DIAGNOSIS — Z9181 History of falling: Secondary | ICD-10-CM | POA: Diagnosis not present

## 2020-04-28 DIAGNOSIS — I2510 Atherosclerotic heart disease of native coronary artery without angina pectoris: Secondary | ICD-10-CM | POA: Diagnosis not present

## 2020-04-28 DIAGNOSIS — Z7951 Long term (current) use of inhaled steroids: Secondary | ICD-10-CM | POA: Diagnosis not present

## 2020-04-28 DIAGNOSIS — G8929 Other chronic pain: Secondary | ICD-10-CM | POA: Diagnosis not present

## 2020-04-28 DIAGNOSIS — Z950 Presence of cardiac pacemaker: Secondary | ICD-10-CM | POA: Diagnosis not present

## 2020-04-30 DIAGNOSIS — Z7982 Long term (current) use of aspirin: Secondary | ICD-10-CM | POA: Diagnosis not present

## 2020-04-30 DIAGNOSIS — Z9181 History of falling: Secondary | ICD-10-CM | POA: Diagnosis not present

## 2020-04-30 DIAGNOSIS — M1611 Unilateral primary osteoarthritis, right hip: Secondary | ICD-10-CM | POA: Diagnosis not present

## 2020-04-30 DIAGNOSIS — G8929 Other chronic pain: Secondary | ICD-10-CM | POA: Diagnosis not present

## 2020-04-30 DIAGNOSIS — Z8781 Personal history of (healed) traumatic fracture: Secondary | ICD-10-CM | POA: Diagnosis not present

## 2020-04-30 DIAGNOSIS — Z950 Presence of cardiac pacemaker: Secondary | ICD-10-CM | POA: Diagnosis not present

## 2020-04-30 DIAGNOSIS — N189 Chronic kidney disease, unspecified: Secondary | ICD-10-CM | POA: Diagnosis not present

## 2020-04-30 DIAGNOSIS — Z7951 Long term (current) use of inhaled steroids: Secondary | ICD-10-CM | POA: Diagnosis not present

## 2020-04-30 DIAGNOSIS — I2510 Atherosclerotic heart disease of native coronary artery without angina pectoris: Secondary | ICD-10-CM | POA: Diagnosis not present

## 2020-05-02 ENCOUNTER — Ambulatory Visit (INDEPENDENT_AMBULATORY_CARE_PROVIDER_SITE_OTHER): Payer: Medicare Other | Admitting: *Deleted

## 2020-05-02 DIAGNOSIS — I441 Atrioventricular block, second degree: Secondary | ICD-10-CM | POA: Diagnosis not present

## 2020-05-03 LAB — CUP PACEART REMOTE DEVICE CHECK
Battery Impedance: 228 Ohm
Battery Remaining Longevity: 142 mo
Battery Voltage: 2.78 V
Brady Statistic AP VP Percent: 0 %
Brady Statistic AP VS Percent: 24 %
Brady Statistic AS VP Percent: 0 %
Brady Statistic AS VS Percent: 75 %
Date Time Interrogation Session: 20210709163758
Implantable Lead Implant Date: 20160112
Implantable Lead Implant Date: 20160112
Implantable Lead Location: 753859
Implantable Lead Location: 753860
Implantable Lead Model: 5076
Implantable Lead Model: 5076
Implantable Pulse Generator Implant Date: 20160112
Lead Channel Impedance Value: 451 Ohm
Lead Channel Impedance Value: 509 Ohm
Lead Channel Pacing Threshold Amplitude: 0.625 V
Lead Channel Pacing Threshold Amplitude: 1.125 V
Lead Channel Pacing Threshold Pulse Width: 0.4 ms
Lead Channel Pacing Threshold Pulse Width: 0.4 ms
Lead Channel Setting Pacing Amplitude: 1.5 V
Lead Channel Setting Pacing Amplitude: 2.25 V
Lead Channel Setting Pacing Pulse Width: 0.4 ms
Lead Channel Setting Sensing Sensitivity: 2 mV

## 2020-05-05 ENCOUNTER — Other Ambulatory Visit: Payer: Self-pay | Admitting: Primary Care

## 2020-05-05 DIAGNOSIS — Z7951 Long term (current) use of inhaled steroids: Secondary | ICD-10-CM | POA: Diagnosis not present

## 2020-05-05 DIAGNOSIS — Z8781 Personal history of (healed) traumatic fracture: Secondary | ICD-10-CM | POA: Diagnosis not present

## 2020-05-05 DIAGNOSIS — N189 Chronic kidney disease, unspecified: Secondary | ICD-10-CM | POA: Diagnosis not present

## 2020-05-05 DIAGNOSIS — Z9181 History of falling: Secondary | ICD-10-CM | POA: Diagnosis not present

## 2020-05-05 DIAGNOSIS — I2510 Atherosclerotic heart disease of native coronary artery without angina pectoris: Secondary | ICD-10-CM | POA: Diagnosis not present

## 2020-05-05 DIAGNOSIS — G8929 Other chronic pain: Secondary | ICD-10-CM | POA: Diagnosis not present

## 2020-05-05 DIAGNOSIS — Z7982 Long term (current) use of aspirin: Secondary | ICD-10-CM | POA: Diagnosis not present

## 2020-05-05 DIAGNOSIS — Z950 Presence of cardiac pacemaker: Secondary | ICD-10-CM | POA: Diagnosis not present

## 2020-05-05 DIAGNOSIS — M1611 Unilateral primary osteoarthritis, right hip: Secondary | ICD-10-CM | POA: Diagnosis not present

## 2020-05-05 NOTE — Progress Notes (Signed)
Remote pacemaker transmission.   

## 2020-05-06 NOTE — Telephone Encounter (Signed)
Please advise Have not prescribed  Last OV (acute) with [name] on 03/17/2020 Future OV scheduled on 07/04/2020

## 2020-05-07 DIAGNOSIS — Z7951 Long term (current) use of inhaled steroids: Secondary | ICD-10-CM | POA: Diagnosis not present

## 2020-05-07 DIAGNOSIS — Z8781 Personal history of (healed) traumatic fracture: Secondary | ICD-10-CM | POA: Diagnosis not present

## 2020-05-07 DIAGNOSIS — G8929 Other chronic pain: Secondary | ICD-10-CM | POA: Diagnosis not present

## 2020-05-07 DIAGNOSIS — Z950 Presence of cardiac pacemaker: Secondary | ICD-10-CM | POA: Diagnosis not present

## 2020-05-07 DIAGNOSIS — M1611 Unilateral primary osteoarthritis, right hip: Secondary | ICD-10-CM | POA: Diagnosis not present

## 2020-05-07 DIAGNOSIS — Z9181 History of falling: Secondary | ICD-10-CM | POA: Diagnosis not present

## 2020-05-07 DIAGNOSIS — N189 Chronic kidney disease, unspecified: Secondary | ICD-10-CM | POA: Diagnosis not present

## 2020-05-07 DIAGNOSIS — Z7982 Long term (current) use of aspirin: Secondary | ICD-10-CM | POA: Diagnosis not present

## 2020-05-07 DIAGNOSIS — I2510 Atherosclerotic heart disease of native coronary artery without angina pectoris: Secondary | ICD-10-CM | POA: Diagnosis not present

## 2020-05-07 NOTE — Telephone Encounter (Signed)
Potassium script needs to go to cardiology. Please clarify the aspirin order with her daughter, is she actually taking this BID? Okay to refill Zyrtec for 90, 3 refills

## 2020-05-08 ENCOUNTER — Other Ambulatory Visit: Payer: Self-pay | Admitting: Cardiovascular Disease

## 2020-05-08 NOTE — Telephone Encounter (Signed)
Spoken to patient's daughter and confirm that patient is taking 2 tablet once a day not BID.  Sent refill of aspirin and zyrtec.  Also inform [facility] that potassium Rx needs to go to Dr [employer].

## 2020-05-09 DIAGNOSIS — Z7982 Long term (current) use of aspirin: Secondary | ICD-10-CM | POA: Diagnosis not present

## 2020-05-09 DIAGNOSIS — Z7951 Long term (current) use of inhaled steroids: Secondary | ICD-10-CM | POA: Diagnosis not present

## 2020-05-09 DIAGNOSIS — G8929 Other chronic pain: Secondary | ICD-10-CM | POA: Diagnosis not present

## 2020-05-09 DIAGNOSIS — Z9181 History of falling: Secondary | ICD-10-CM | POA: Diagnosis not present

## 2020-05-09 DIAGNOSIS — Z8781 Personal history of (healed) traumatic fracture: Secondary | ICD-10-CM | POA: Diagnosis not present

## 2020-05-09 DIAGNOSIS — M1611 Unilateral primary osteoarthritis, right hip: Secondary | ICD-10-CM | POA: Diagnosis not present

## 2020-05-09 DIAGNOSIS — N189 Chronic kidney disease, unspecified: Secondary | ICD-10-CM | POA: Diagnosis not present

## 2020-05-09 DIAGNOSIS — I2510 Atherosclerotic heart disease of native coronary artery without angina pectoris: Secondary | ICD-10-CM | POA: Diagnosis not present

## 2020-05-09 DIAGNOSIS — Z950 Presence of cardiac pacemaker: Secondary | ICD-10-CM | POA: Diagnosis not present

## 2020-05-12 ENCOUNTER — Other Ambulatory Visit: Payer: Self-pay | Admitting: Primary Care

## 2020-05-12 DIAGNOSIS — M1611 Unilateral primary osteoarthritis, right hip: Secondary | ICD-10-CM | POA: Diagnosis not present

## 2020-05-12 DIAGNOSIS — I2510 Atherosclerotic heart disease of native coronary artery without angina pectoris: Secondary | ICD-10-CM | POA: Diagnosis not present

## 2020-05-12 DIAGNOSIS — N189 Chronic kidney disease, unspecified: Secondary | ICD-10-CM | POA: Diagnosis not present

## 2020-05-12 DIAGNOSIS — Z950 Presence of cardiac pacemaker: Secondary | ICD-10-CM | POA: Diagnosis not present

## 2020-05-12 DIAGNOSIS — Z8781 Personal history of (healed) traumatic fracture: Secondary | ICD-10-CM | POA: Diagnosis not present

## 2020-05-12 DIAGNOSIS — Z9181 History of falling: Secondary | ICD-10-CM | POA: Diagnosis not present

## 2020-05-12 DIAGNOSIS — Z7982 Long term (current) use of aspirin: Secondary | ICD-10-CM | POA: Diagnosis not present

## 2020-05-12 DIAGNOSIS — G8929 Other chronic pain: Secondary | ICD-10-CM | POA: Diagnosis not present

## 2020-05-12 DIAGNOSIS — Z7951 Long term (current) use of inhaled steroids: Secondary | ICD-10-CM | POA: Diagnosis not present

## 2020-05-12 NOTE — Telephone Encounter (Signed)
This does not go to me for refill, needs to go to her eye doctor.

## 2020-05-12 NOTE — Telephone Encounter (Signed)
Have not prescribed. Last OV (acute) with [name] on 03/17/2020 Future OV scheduled on 07/04/2020

## 2020-05-13 NOTE — Telephone Encounter (Signed)
Spoke with [name] who reports patient began PT/OT since arrival to assisted living in mid June 2020. She is needing a face to face visit per Medicare requirements for coverage. She's been doing very well with PT/OT in regards to balance and coordination and would like to continue.  She will do a face to face with the members of the assisted living facility tomorrow at 12 pm.  Will you please schedule, Johny Drilling?

## 2020-05-13 NOTE — Telephone Encounter (Signed)
Noted. scheduled patient on Wed 05/14/2020 at 12 pm

## 2020-05-14 ENCOUNTER — Telehealth (INDEPENDENT_AMBULATORY_CARE_PROVIDER_SITE_OTHER): Payer: Medicare Other | Admitting: Primary Care

## 2020-05-14 DIAGNOSIS — R269 Unspecified abnormalities of gait and mobility: Secondary | ICD-10-CM | POA: Diagnosis not present

## 2020-05-14 DIAGNOSIS — M810 Age-related osteoporosis without current pathological fracture: Secondary | ICD-10-CM

## 2020-05-14 DIAGNOSIS — G8929 Other chronic pain: Secondary | ICD-10-CM

## 2020-05-14 DIAGNOSIS — M25551 Pain in right hip: Secondary | ICD-10-CM | POA: Diagnosis not present

## 2020-05-14 DIAGNOSIS — G301 Alzheimer's disease with late onset: Secondary | ICD-10-CM | POA: Diagnosis not present

## 2020-05-14 DIAGNOSIS — F0280 Dementia in other diseases classified elsewhere without behavioral disturbance: Secondary | ICD-10-CM

## 2020-05-14 NOTE — Progress Notes (Signed)
Subjective:    Patient ID: [name], female    DOB: 07/23/1934, 86 y.o.   MRN: [id number]  HPI  Virtual Visit via Video Note  I connected with [name] on 05/14/20 at 12:00 PM EDT by a video enabled telemedicine application and verified that I am speaking with the correct person using two identifiers.  Location: Patient: Assisted Living Provider: Office Participants: Myself, patient, patient's daughter   I discussed the limitations of evaluation and management by telemedicine and the availability of in person appointments. The patient expressed understanding and agreed to proceed.  History of Present Illness:  [name] is a 86 year old female with a history of CAD, hypertension, hypothyroidism, dementia, osteoarthritis, osteoporosis, depression, urinary frequency who presents today for a face to face visit to continue occupational and physical therapy.  She is currently residing in assisted living and is needing a face to face visit for insurance purposes. Her daughter is helping with HPI today. She's been active with OT and PT since mid June 2021 and has greatly benefited. She wishes to continue with OT and PT given improvements.   Improvements include better balance, increased strength to upper and lower extremities, ability to complete more ADL's. She is hardly having to rely on her walker. No recent falls.  We need to fax our notes to [phone] attention to [facility].    Observations/Objective:  Alert and oriented. Appears well, not sickly. No distress. Speaking in complete sentences.   Assessment and Plan:  I approve continued PT/OT as she is clearly benefiting. Will fax over office notes as requested. She has fulfilled the face to face requirements.   Follow Up Instructions:  Continue to work with physical and occupational therapy.  It was a pleasure to see you today! Mayra Reel, NP-C    I discussed the assessment and treatment plan with  the patient. The patient was provided an opportunity to ask questions and all were answered. The patient agreed with the plan and demonstrated an understanding of the instructions.   The patient was advised to call back or seek an in-person evaluation if the symptoms worsen or if the condition fails to improve as anticipated.    Doreene Nest, NP    Review of Systems  Respiratory: Negative for shortness of breath.   Cardiovascular: Negative for chest pain.  Musculoskeletal: Positive for arthralgias.  Neurological: Negative for weakness and light-headedness.       Past Medical History:  Diagnosis Date  . AAA (abdominal aortic aneurysm) (HCC) 01/03/13   3.5x3.3cm  . Alzheimer's dementia (HCC)    "probably middle stage" (11/05/2014)  . Anemia    "hx of chronic" (11/05/2014)  . Aortic stenosis 03/29/2008   biological prosthetic replacement  . Arthritis    "joints" (11/05/2014)  . Asthma   . Chronic asthmatic bronchitis (HCC)    "q fall and winter" (11/05/2014)  . Coronary artery disease    CABG 03/29/08  . Dyslipidemia   . Exertional shortness of breath   . Family history of adverse reaction to anesthesia    "daughter gets PONV too"  . Heart murmur   . Hypertension   . Hypothyroidism   . LBBB (left bundle branch block)   . Migraines    "none in years' (11/05/2014)  . Mobitz type 2 second degree AV block 10/31/2014  . MVP (mitral valve prolapse)    with mild mitral insufficiency/notes 08/17/2013  . PONV (postoperative nausea and vomiting)   . Presence  of permanent cardiac pacemaker   . Sinus pause 10/31/2014  . Stroke [facility]) 03/2008   "2 light strokes"; denies residual on 11/05/2014  . Urinary frequency      Social History   Socioeconomic History  . Marital status: Divorced    Spouse name: Not on file  . Number of children: 1  . Years of education: Some college  . Highest education level: Not on file  Occupational History  . Occupation: Retired  Tobacco Use  . Smoking  status: Never Smoker  . Smokeless tobacco: Never Used  Vaping Use  . Vaping Use: Never used  Substance and Sexual Activity  . Alcohol use: No    Alcohol/week: 0.0 standard drinks  . Drug use: No  . Sexual activity: Not Currently  Other Topics Concern  . 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 Difficulty of Paying Living Expenses: Not hard at all  Food Insecurity: No Food Insecurity  . Worried About Programme researcher, broadcasting/film/video in the Last Year: Never true  . Ran Out of Food in the Last Year: Never true  Transportation Needs: No Transportation Needs  . Lack of Transportation (Medical): No  . Lack of Transportation (Non-Medical): No  Physical Activity: Inactive  . Days of Exercise per Week: 0 days  . Minutes of Exercise per Session: 0 min  Stress: No Stress Concern Present  . Feeling of Stress : Not at all  Social Connections:   . Frequency of Communication with Friends and Family:   . Frequency of Social Gatherings with Friends and Family:   . Attends Religious Services:   . Active Member of Clubs or Organizations:   . Attends Banker Meetings:   Marland Kitchen Marital Status:   Intimate Partner Violence: Not At Risk  . Fear of Current or Ex-Partner: No  . Emotionally Abused: No  . Physically Abused: No  . Sexually Abused: No    Past Surgical History:  Procedure Laterality Date  . ABDOMINAL HYSTERECTOMY  1977  . AORTIC VALVE REPLACEMENT  03/29/2008   PERICARDIAL TISSUE VALVE  . APPENDECTOMY  ?1977  . CARDIAC CATHETERIZATION  ~ 2009  . CATARACT EXTRACTION W/ INTRAOCULAR LENS IMPLANT Left   . CORONARY ARTERY BYPASS GRAFT  03/29/2008   LIMA TO LAD,SVG TO CX,SVG TO LEFT POSTEROLATERAL BRANCH  . HIP FRACTURE SURGERY Right 06/2010   "3  metal screws"   . INSERT / REPLACE / REMOVE PACEMAKER  11/05/2014  . LOOP RECORDER EXPLANT N/A 11/05/2014   Procedure: LOOP RECORDER EXPLANT;  Surgeon: [name], MD;  Location: [facility];  Service: Cardiovascular;  Laterality: N/A;  . LOOP RECORDER IMPLANT N/A 09/17/2013   Procedure: LOOP RECORDER IMPLANT;  Surgeon: [name], MD;  Location: [facility];  Service: Cardiovascular;  Laterality: N/A;  . PERMANENT PACEMAKER INSERTION N/A 11/05/2014   Procedure: PERMANENT PACEMAKER INSERTION;  Surgeon: [name], MD;  Location: [facility];  Service: Cardiovascular;  Laterality: N/A;  . REFRACTIVE SURGERY Bilateral    [name] 04/28/2008 (08/17/2013)  . RETINAL DETACHMENT SURGERY Left 1994   [name] 04/10/2008 (08/17/2013)  . TIBIAL TUBERCLERPLASTY  11/05/2014  . tooth pulled   06/2018  . TOTAL HIP ARTHROPLASTY  Right 07/31/2018   Procedure: RIGHT TOTAL HIP ARTHROPLASTY ANTERIOR APPROACH;  Surgeon: Gean Birchwood, MD;  Location: [facility];  Service: Orthopedics;  Laterality: Right;  . UTERINE FIBROID SURGERY  1960's    Family History  Problem Relation Age of Onset  . Heart failure Mother   . Diabetes Mother   . Hypertension Mother   . Hyperlipidemia Mother   . Melanoma Grandchild   . Colon cancer Neg Hx     Allergies  Allergen Reactions  . Codeine Nausea And Vomiting and Other (See Comments)    Patient gets violently ill  . Morphine And Related Nausea And Vomiting and Other (See Comments)    Patient get violently ill    Current Outpatient Medications on File Prior to Visit  Medication Sig Dispense Refill  . alendronate (FOSAMAX) 70 MG tablet TAKE 1 TAB BY MOUTH ONCE A WEEK. ON SUNDAYS, TAKE WITH A FULL GLASS OF WATER ON AN EMPTY STOMACH. 12 tablet 3  . amoxicillin (AMOXIL) 500 MG tablet Take 2,000 mg by mouth See admin instructions. Take 2,000mg  by mouth 1 hour prior to dental appointments  1  . aspirin (ASPIRIN LOW DOSE) 81 MG EC tablet Take 2 tablets (162 mg total) by mouth  daily. 180 tablet 3  . atorvastatin (LIPITOR) 40 MG tablet TAKE 1 TABLET BY MOUTH EVERY DAY IN THE EVENING FOR CHOLESTEROL 90 tablet 1  . brimonidine (ALPHAGAN) 0.2 % ophthalmic solution Place 1 drop into the left eye 2 (two) times daily.  3  . cetirizine (ZYRTEC) 10 MG chewable tablet Chew 10 mg by mouth daily.    . cetirizine (ZYRTEC) 10 MG tablet TAKE 1 TABLET BY MOUTH ONCE DAILY 90 tablet 3  . FLUoxetine (PROZAC) 10 MG capsule TAKE 1 CAPSULE (10 MG TOTAL) BY MOUTH DAILY FOR ANXIETY AND DEPRESSION 90 capsule 1  . fluticasone (FLONASE) 50 MCG/ACT nasal spray Place 1 spray into both nostrils daily. 16 g 2  . furosemide (LASIX) 80 MG tablet Please take 80 mg once a day. 90 tablet 3  . KLOR-CON M10 10 MEQ tablet TAKE 2 TABS IN THE MORNING, 3 TABS IN THE AFTERNOON, AND 3 TABS IN THE EVENING.    Marland Kitchen levothyroxine (SYNTHROID) 50 MCG tablet TAKE 1 TABLET BY MOUTH EVERY DAY BEFORE BREAKFAST 90 tablet 1  . losartan (COZAAR) 50 MG tablet TAKE 2 TABLETS (100 MG TOTAL) BY MOUTH DAILY. FOR BLOOD PRESSURE 180 tablet 1  . montelukast (SINGULAIR) 10 MG tablet TAKE 1 TABLET BY MOUTH EVERYDAY AT BEDTIME 90 tablet 1  . NAMZARIC 28-10 MG CP24 TAKE 1 CAPSULE BY MOUTH EVERY DAY 90 capsule 3  . potassium chloride (KLOR-CON) 10 MEQ tablet TAKE 2 TABLETS IN THE MORNING, 3 TABLETSIN THE AFTERNOON, AND 3 TABLETS IN THE EVENING 240 tablet 1  . WIXELA INHUB 250-50 MCG/DOSE AEPB INHALE 1 PUFF INTO THE LUNGS 2 (TWO) TIMES DAILY. FOR COPD 180 each 1   No current facility-administered medications on file prior to visit.    There were no vitals taken for this visit.    Objective:   Physical Exam Pulmonary:     Effort: Pulmonary effort is normal.  Neurological:     Mental Status: She is alert.     Comments: Oriented but not answering most questions correctly  Psychiatric:        Mood and Affect: Mood normal.            Assessment & Plan:

## 2020-05-14 NOTE — Telephone Encounter (Signed)
Chan, FYI.  °

## 2020-05-14 NOTE — Assessment & Plan Note (Signed)
I approve continued PT/OT as she is clearly benefiting. Will fax over office notes as requested. She has fulfilled the face to face requirements.  

## 2020-05-14 NOTE — Assessment & Plan Note (Signed)
I approve continued PT/OT as she is clearly benefiting. Will fax over office notes as requested. She has fulfilled the face to face requirements.

## 2020-05-14 NOTE — Patient Instructions (Signed)
Continue to work with physical and occupational therapy.  It was a pleasure to see you today! Mayra Reel, NP-C

## 2020-05-17 ENCOUNTER — Other Ambulatory Visit: Payer: Self-pay | Admitting: Primary Care

## 2020-05-17 DIAGNOSIS — J4530 Mild persistent asthma, uncomplicated: Secondary | ICD-10-CM

## 2020-05-19 NOTE — Telephone Encounter (Signed)
Have not prescribed  Last OV (follow up ) with [name] on 05/14/2020 Future OV scheduled on 07/04/2020

## 2020-05-19 NOTE — Telephone Encounter (Signed)
Refills sent to pharmacy. 

## 2020-05-20 ENCOUNTER — Telehealth: Payer: Self-pay | Admitting: *Deleted

## 2020-05-20 NOTE — Telephone Encounter (Signed)
Cindy with [facility] left a voicemail stating that she has faxed over a request for additional information for patient's home health plan of treatment. Arline Asp stated that the coder has asked what medical diagnosis is precipitating the weakness and balance deficit to warrant the need for therapy.

## 2020-05-21 ENCOUNTER — Other Ambulatory Visit: Payer: Self-pay | Admitting: Primary Care

## 2020-05-21 ENCOUNTER — Other Ambulatory Visit: Payer: Self-pay | Admitting: Neurology

## 2020-05-21 DIAGNOSIS — I2510 Atherosclerotic heart disease of native coronary artery without angina pectoris: Secondary | ICD-10-CM | POA: Diagnosis not present

## 2020-05-21 DIAGNOSIS — G8929 Other chronic pain: Secondary | ICD-10-CM | POA: Diagnosis not present

## 2020-05-21 DIAGNOSIS — Z950 Presence of cardiac pacemaker: Secondary | ICD-10-CM | POA: Diagnosis not present

## 2020-05-21 DIAGNOSIS — Z7951 Long term (current) use of inhaled steroids: Secondary | ICD-10-CM | POA: Diagnosis not present

## 2020-05-21 DIAGNOSIS — Z9181 History of falling: Secondary | ICD-10-CM | POA: Diagnosis not present

## 2020-05-21 DIAGNOSIS — N189 Chronic kidney disease, unspecified: Secondary | ICD-10-CM | POA: Diagnosis not present

## 2020-05-21 DIAGNOSIS — M1611 Unilateral primary osteoarthritis, right hip: Secondary | ICD-10-CM | POA: Diagnosis not present

## 2020-05-21 DIAGNOSIS — Z8781 Personal history of (healed) traumatic fracture: Secondary | ICD-10-CM | POA: Diagnosis not present

## 2020-05-21 DIAGNOSIS — Z7982 Long term (current) use of aspirin: Secondary | ICD-10-CM | POA: Diagnosis not present

## 2020-05-21 NOTE — Telephone Encounter (Signed)
I have not yet seen this fax, but the diagnoses are: chronic hip pain, osteoarthritis of right hip, dementia

## 2020-05-21 NOTE — Telephone Encounter (Signed)
Spoken to rep at [facility] that this refill request needs to go to Dr Patrcia Dolly not Mayra Reel

## 2020-05-22 NOTE — Telephone Encounter (Signed)
Spoken and notified Arline Asp of Graylon Gunning comments.

## 2020-05-23 ENCOUNTER — Other Ambulatory Visit: Payer: Self-pay | Admitting: Primary Care

## 2020-05-23 DIAGNOSIS — G301 Alzheimer's disease with late onset: Secondary | ICD-10-CM

## 2020-05-23 DIAGNOSIS — R059 Cough, unspecified: Secondary | ICD-10-CM

## 2020-05-23 DIAGNOSIS — J449 Chronic obstructive pulmonary disease, unspecified: Secondary | ICD-10-CM

## 2020-05-23 DIAGNOSIS — F3342 Major depressive disorder, recurrent, in full remission: Secondary | ICD-10-CM

## 2020-05-23 MED ORDER — NAMZARIC 28-10 MG PO CP24: 1.0000 | ORAL_CAPSULE | Freq: Every day | ORAL | 3 refills | Status: DC

## 2020-05-26 NOTE — Telephone Encounter (Signed)
Rx placed in Chan's inbox to fax to facility.

## 2020-05-27 DIAGNOSIS — M1611 Unilateral primary osteoarthritis, right hip: Secondary | ICD-10-CM | POA: Diagnosis not present

## 2020-05-27 DIAGNOSIS — Z950 Presence of cardiac pacemaker: Secondary | ICD-10-CM | POA: Diagnosis not present

## 2020-05-27 DIAGNOSIS — Z7982 Long term (current) use of aspirin: Secondary | ICD-10-CM | POA: Diagnosis not present

## 2020-05-27 DIAGNOSIS — N189 Chronic kidney disease, unspecified: Secondary | ICD-10-CM | POA: Diagnosis not present

## 2020-05-27 DIAGNOSIS — Z7951 Long term (current) use of inhaled steroids: Secondary | ICD-10-CM | POA: Diagnosis not present

## 2020-05-27 DIAGNOSIS — G8929 Other chronic pain: Secondary | ICD-10-CM | POA: Diagnosis not present

## 2020-05-27 DIAGNOSIS — I2510 Atherosclerotic heart disease of native coronary artery without angina pectoris: Secondary | ICD-10-CM | POA: Diagnosis not present

## 2020-05-27 DIAGNOSIS — Z9181 History of falling: Secondary | ICD-10-CM | POA: Diagnosis not present

## 2020-05-27 DIAGNOSIS — Z8781 Personal history of (healed) traumatic fracture: Secondary | ICD-10-CM | POA: Diagnosis not present

## 2020-05-27 NOTE — Telephone Encounter (Signed)
Faxed Rx as instructed. °

## 2020-06-03 DIAGNOSIS — Z8781 Personal history of (healed) traumatic fracture: Secondary | ICD-10-CM | POA: Diagnosis not present

## 2020-06-03 DIAGNOSIS — I2510 Atherosclerotic heart disease of native coronary artery without angina pectoris: Secondary | ICD-10-CM | POA: Diagnosis not present

## 2020-06-03 DIAGNOSIS — Z7982 Long term (current) use of aspirin: Secondary | ICD-10-CM | POA: Diagnosis not present

## 2020-06-03 DIAGNOSIS — M1611 Unilateral primary osteoarthritis, right hip: Secondary | ICD-10-CM | POA: Diagnosis not present

## 2020-06-03 DIAGNOSIS — Z950 Presence of cardiac pacemaker: Secondary | ICD-10-CM | POA: Diagnosis not present

## 2020-06-03 DIAGNOSIS — Z7951 Long term (current) use of inhaled steroids: Secondary | ICD-10-CM | POA: Diagnosis not present

## 2020-06-03 DIAGNOSIS — G8929 Other chronic pain: Secondary | ICD-10-CM | POA: Diagnosis not present

## 2020-06-03 DIAGNOSIS — Z9181 History of falling: Secondary | ICD-10-CM | POA: Diagnosis not present

## 2020-06-03 DIAGNOSIS — N189 Chronic kidney disease, unspecified: Secondary | ICD-10-CM | POA: Diagnosis not present

## 2020-06-09 ENCOUNTER — Ambulatory Visit: Payer: Medicare Other | Admitting: Neurology

## 2020-06-09 DIAGNOSIS — Z7951 Long term (current) use of inhaled steroids: Secondary | ICD-10-CM | POA: Diagnosis not present

## 2020-06-09 DIAGNOSIS — Z7982 Long term (current) use of aspirin: Secondary | ICD-10-CM | POA: Diagnosis not present

## 2020-06-09 DIAGNOSIS — N189 Chronic kidney disease, unspecified: Secondary | ICD-10-CM | POA: Diagnosis not present

## 2020-06-09 DIAGNOSIS — G8929 Other chronic pain: Secondary | ICD-10-CM | POA: Diagnosis not present

## 2020-06-09 DIAGNOSIS — I2510 Atherosclerotic heart disease of native coronary artery without angina pectoris: Secondary | ICD-10-CM | POA: Diagnosis not present

## 2020-06-09 DIAGNOSIS — Z950 Presence of cardiac pacemaker: Secondary | ICD-10-CM | POA: Diagnosis not present

## 2020-06-09 DIAGNOSIS — M1611 Unilateral primary osteoarthritis, right hip: Secondary | ICD-10-CM | POA: Diagnosis not present

## 2020-06-09 DIAGNOSIS — Z9181 History of falling: Secondary | ICD-10-CM | POA: Diagnosis not present

## 2020-06-09 DIAGNOSIS — Z8781 Personal history of (healed) traumatic fracture: Secondary | ICD-10-CM | POA: Diagnosis not present

## 2020-06-10 ENCOUNTER — Other Ambulatory Visit: Payer: Self-pay | Admitting: Primary Care

## 2020-06-13 ENCOUNTER — Other Ambulatory Visit: Payer: Self-pay | Admitting: Primary Care

## 2020-06-13 DIAGNOSIS — E039 Hypothyroidism, unspecified: Secondary | ICD-10-CM

## 2020-06-24 ENCOUNTER — Other Ambulatory Visit: Payer: Self-pay | Admitting: Primary Care

## 2020-06-24 DIAGNOSIS — J449 Chronic obstructive pulmonary disease, unspecified: Secondary | ICD-10-CM

## 2020-06-24 DIAGNOSIS — E785 Hyperlipidemia, unspecified: Secondary | ICD-10-CM

## 2020-06-27 ENCOUNTER — Other Ambulatory Visit: Payer: Self-pay | Admitting: Cardiovascular Disease

## 2020-07-04 ENCOUNTER — Other Ambulatory Visit: Payer: Self-pay

## 2020-07-04 ENCOUNTER — Ambulatory Visit (INDEPENDENT_AMBULATORY_CARE_PROVIDER_SITE_OTHER): Payer: Medicare Other | Admitting: Primary Care

## 2020-07-04 ENCOUNTER — Encounter: Payer: Self-pay | Admitting: Primary Care

## 2020-07-04 ENCOUNTER — Ambulatory Visit: Payer: Medicare Other | Admitting: Primary Care

## 2020-07-04 VITALS — BP 118/74 | HR 75 | Ht 67.5 in | Wt 162.0 lb

## 2020-07-04 DIAGNOSIS — F0280 Dementia in other diseases classified elsewhere without behavioral disturbance: Secondary | ICD-10-CM

## 2020-07-04 DIAGNOSIS — Z0000 Encounter for general adult medical examination without abnormal findings: Secondary | ICD-10-CM

## 2020-07-04 DIAGNOSIS — N183 Chronic kidney disease, stage 3 unspecified: Secondary | ICD-10-CM

## 2020-07-04 DIAGNOSIS — F3342 Major depressive disorder, recurrent, in full remission: Secondary | ICD-10-CM

## 2020-07-04 DIAGNOSIS — H40011 Open angle with borderline findings, low risk, right eye: Secondary | ICD-10-CM | POA: Diagnosis not present

## 2020-07-04 DIAGNOSIS — I714 Abdominal aortic aneurysm, without rupture, unspecified: Secondary | ICD-10-CM

## 2020-07-04 DIAGNOSIS — J4530 Mild persistent asthma, uncomplicated: Secondary | ICD-10-CM

## 2020-07-04 DIAGNOSIS — I10 Essential (primary) hypertension: Secondary | ICD-10-CM | POA: Diagnosis not present

## 2020-07-04 DIAGNOSIS — H401122 Primary open-angle glaucoma, left eye, moderate stage: Secondary | ICD-10-CM | POA: Diagnosis not present

## 2020-07-04 DIAGNOSIS — H33052 Total retinal detachment, left eye: Secondary | ICD-10-CM | POA: Diagnosis not present

## 2020-07-04 DIAGNOSIS — G301 Alzheimer's disease with late onset: Secondary | ICD-10-CM

## 2020-07-04 DIAGNOSIS — E039 Hypothyroidism, unspecified: Secondary | ICD-10-CM

## 2020-07-04 DIAGNOSIS — Z950 Presence of cardiac pacemaker: Secondary | ICD-10-CM

## 2020-07-04 DIAGNOSIS — I2510 Atherosclerotic heart disease of native coronary artery without angina pectoris: Secondary | ICD-10-CM

## 2020-07-04 DIAGNOSIS — H43811 Vitreous degeneration, right eye: Secondary | ICD-10-CM | POA: Diagnosis not present

## 2020-07-04 DIAGNOSIS — M810 Age-related osteoporosis without current pathological fracture: Secondary | ICD-10-CM

## 2020-07-04 DIAGNOSIS — Z961 Presence of intraocular lens: Secondary | ICD-10-CM | POA: Diagnosis not present

## 2020-07-04 DIAGNOSIS — I441 Atrioventricular block, second degree: Secondary | ICD-10-CM

## 2020-07-04 DIAGNOSIS — E7800 Pure hypercholesterolemia, unspecified: Secondary | ICD-10-CM | POA: Diagnosis not present

## 2020-07-04 DIAGNOSIS — R269 Unspecified abnormalities of gait and mobility: Secondary | ICD-10-CM

## 2020-07-04 NOTE — Assessment & Plan Note (Signed)
Compliant to statin therapy, repeat lipids pending. °

## 2020-07-04 NOTE — Assessment & Plan Note (Signed)
Repeat renal panel pending, continue losartan for renal protection and blood pressure control.

## 2020-07-04 NOTE — Assessment & Plan Note (Signed)
Following with cardiology and electrophysiology.

## 2020-07-04 NOTE — Assessment & Plan Note (Addendum)
She seems to be receiving levothyroxine at the correct time, she is now residing in a skilled nursing facility.  Repeat TSH pending.  Continue levothyroxine 50 mcg.

## 2020-07-04 NOTE — Progress Notes (Signed)
Subjective:    Patient ID: [name], female    DOB: 1934-01-28, 86 y.o.   MRN: [id number]  HPI  This visit occurred during the SARS-CoV-2 public health emergency.  Safety protocols were in place, including screening questions prior to the visit, additional usage of staff PPE, and extensive cleaning of exam room while observing appropriate contact time as indicated for disinfecting solutions.   [name] is an 86 year old female who presents today for complete physical.  Immunizations: -Tetanus: Completed in 2012 -Influenza: Due, will get free from the [place] next week -Shingles: Completed in 2021 -Pneumonia: Completed series  -Covid-19: Completed   Diet:  She endorses a healthy diet.  Exercise: Active, no regular exercise  Eye exam: Completed today Dental exam: Completed semi-annually   Mammogram: Completed last in 2017 Dexa: Completed in 2020 Colonoscopy: N/A due to age Hep C Screen: Negative   BP Readings from Last 3 Encounters:  07/04/20 118/74  03/17/20 118/74  12/31/19 118/72     Review of Systems  Constitutional: Negative for unexpected weight change.  HENT: Negative for rhinorrhea.   Respiratory: Negative for cough and shortness of breath.   Cardiovascular: Negative for chest pain.  Gastrointestinal: Negative for constipation and diarrhea.  Genitourinary: Negative for difficulty urinating.  Musculoskeletal: Positive for arthralgias.  Skin: Negative for rash.  Allergic/Immunologic: Negative for environmental allergies.  Neurological: Negative for dizziness, numbness and headaches.  Psychiatric/Behavioral: The patient is not nervous/anxious.        Past Medical History:  Diagnosis Date  . AAA (abdominal aortic aneurysm) (HCC) 01/03/13   3.5x3.3cm  . Alzheimer's dementia (HCC)    "probably middle stage" (11/05/2014)  . Anemia    "hx of chronic" (11/05/2014)  . Aortic stenosis 03/29/2008   biological prosthetic replacement  . Arthritis    "joints"  (11/05/2014)  . Asthma   . Chronic asthmatic bronchitis (HCC)    "q fall and winter" (11/05/2014)  . Coronary artery disease    CABG 03/29/08  . Dyslipidemia   . Exertional shortness of breath   . Family history of adverse reaction to anesthesia    "daughter gets PONV too"  . Heart murmur   . Hypertension   . Hypothyroidism   . LBBB (left bundle branch block)   . Migraines    "none in years' (11/05/2014)  . Mobitz type 2 second degree AV block 10/31/2014  . MVP (mitral valve prolapse)    with mild mitral insufficiency/notes 08/17/2013  . PONV (postoperative nausea and vomiting)   . Presence of permanent cardiac pacemaker   . Sinus pause 10/31/2014  . Stroke [facility]) 03/2008   "2 light strokes"; denies residual on 11/05/2014  . Urinary frequency      Social History   Socioeconomic History  . Marital status: Divorced    Spouse name: Not on file  . Number of children: 1  . Years of education: Some college  . Highest education level: Not on file  Occupational History  . Occupation: Retired  Tobacco Use  . Smoking status: Never Smoker  . Smokeless tobacco: Never Used  Vaping Use  . Vaping Use: Never used  Substance and Sexual Activity  . Alcohol use: No    Alcohol/week: 0.0 standard drinks  . Drug use: No  . Sexual activity: Not Currently  Other Topics Concern  . 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 Difficulty of Paying Living Expenses: Not on file  Food Insecurity:   . Worried About Programme researcher, broadcasting/film/video in the Last Year: Not on file  . Ran Out of Food in the Last Year: Not on file  Transportation Needs:   . Lack of Transportation (Medical): Not on file  . Lack of Transportation (Non-Medical): Not on file  Physical Activity:   . Days  of Exercise per Week: Not on file  . Minutes of Exercise per Session: Not on file  Stress:   . Feeling of Stress : Not on file  Social Connections:   . Frequency of Communication with Friends and Family: Not on file  . Frequency of Social Gatherings with Friends and Family: Not on file  . Attends Religious Services: Not on file  . Active Member of Clubs or Organizations: Not on file  . Attends Banker Meetings: Not on file  . Marital Status: Not on file  Intimate Partner Violence:   . Fear of Current or Ex-Partner: Not on file  . Emotionally Abused: Not on file  . Physically Abused: Not on file  . Sexually Abused: Not on file    Past Surgical History:  Procedure Laterality Date  . ABDOMINAL HYSTERECTOMY  1977  . AORTIC VALVE REPLACEMENT  03/29/2008   PERICARDIAL TISSUE VALVE  . APPENDECTOMY  ?1977  . CARDIAC CATHETERIZATION  ~ 2009  . CATARACT EXTRACTION W/ INTRAOCULAR LENS IMPLANT Left   . CORONARY ARTERY BYPASS GRAFT  03/29/2008   LIMA TO LAD,SVG TO CX,SVG TO LEFT POSTEROLATERAL BRANCH  . HIP FRACTURE SURGERY Right 06/2010   "3 metal screws"   . INSERT / REPLACE / REMOVE PACEMAKER  11/05/2014  . LOOP RECORDER EXPLANT N/A 11/05/2014   Procedure: LOOP RECORDER EXPLANT;  Surgeon: [name], MD;  Location: [facility];  Service: Cardiovascular;  Laterality: N/A;  . LOOP RECORDER IMPLANT N/A 09/17/2013   Procedure: LOOP RECORDER IMPLANT;  Surgeon: [name], MD;  Location: [facility];  Service: Cardiovascular;  Laterality: N/A;  . PERMANENT PACEMAKER INSERTION N/A 11/05/2014   Procedure: PERMANENT PACEMAKER INSERTION;  Surgeon: [name], MD;  Location: [facility];  Service: Cardiovascular;  Laterality: N/A;  . REFRACTIVE SURGERY Bilateral    [name] 04/28/2008 (08/17/2013)  . RETINAL DETACHMENT SURGERY Left 1994   [name] 04/10/2008 (08/17/2013)  . TIBIAL TUBERCLERPLASTY  11/05/2014  . tooth pulled   06/2018  . TOTAL HIP ARTHROPLASTY Right 07/31/2018    Procedure: RIGHT TOTAL HIP ARTHROPLASTY ANTERIOR APPROACH;  Surgeon: Gean Birchwood, MD;  Location: [facility];  Service: Orthopedics;  Laterality: Right;  . UTERINE FIBROID SURGERY  1960's    Family History  Problem Relation Age of Onset  . Heart failure Mother   . Diabetes Mother   . Hypertension Mother   . Hyperlipidemia Mother   . Melanoma Grandchild   . Colon cancer Neg Hx     Allergies  Allergen Reactions  . Codeine Nausea And Vomiting and Other (See Comments)    Patient gets violently ill  . Morphine And Related Nausea And Vomiting and Other (See Comments)    Patient get violently ill    Current Outpatient Medications on File Prior to Visit  Medication Sig Dispense Refill  . alendronate (FOSAMAX) 70 MG tablet TAKE 1 TAB BY MOUTH ONCE  A WEEK. ON SUNDAYS, TAKE WITH A FULL GLASS OF WATER ON AN EMPTY STOMACH. 12 tablet 3  . amoxicillin (AMOXIL) 500 MG tablet Take 2,000 mg by mouth See admin instructions. Take 2,000mg  by mouth 1 hour prior to dental appointments  1  . aspirin (ASPIRIN LOW DOSE) 81 MG EC tablet Take 2 tablets (162 mg total) by mouth daily. 180 tablet 3  . atorvastatin (LIPITOR) 40 MG tablet TAKE 1 TABLET BY MOUTH ONCE DAILY IN THEEVENING FOR CHOLESTEROL 90 tablet 0  . brimonidine (ALPHAGAN) 0.2 % ophthalmic solution Place 1 drop into the left eye 2 (two) times daily.  3  . cetirizine (ZYRTEC) 10 MG chewable tablet Chew 10 mg by mouth daily.    . cetirizine (ZYRTEC) 10 MG tablet TAKE 1 TABLET BY MOUTH ONCE DAILY 90 tablet 3  . FLUoxetine (PROZAC) 10 MG capsule TAKE 1 CAPSULE BY MOUTH DAILY FOR ANXIETY AND DEPRESSION 90 capsule 1  . fluticasone (FLONASE) 50 MCG/ACT nasal spray PLACE 1 SPRAY INTO BOTH NOSTRILS DAILY 16 g 2  . furosemide (LASIX) 80 MG tablet Please take 80 mg once a day. 90 tablet 3  . KLOR-CON M10 10 MEQ tablet TAKE 2 TABS IN THE MORNING, 3 TABS IN THE AFTERNOON, AND 3 TABS IN THE EVENING.    Marland Kitchen levothyroxine (SYNTHROID) 50 MCG tablet TAKE 1 TABLET BY MOUTH  EVERY DAY BEFORE BREAKFAST 90 tablet 1  . losartan (COZAAR) 100 MG tablet TAKE 1 TABLET BY MOUTH ONCE DAILY FOR BLOOD PRESSURE 30 tablet 11  . Memantine HCl-Donepezil HCl (NAMZARIC) 28-10 MG CP24 Take 1 capsule by mouth daily. For memory. 90 capsule 3  . montelukast (SINGULAIR) 10 MG tablet TAKE 1 TABLET BY MOUTH EVERYDAY AT BEDTIME 90 tablet 1  . potassium chloride (KLOR-CON) 10 MEQ tablet TAKE 2 TABLETS IN THE MORNING, 3 TABLETSIN THE AFTERNOON, AND 3 TABLETS IN THE EVENING 240 tablet 2  . WIXELA INHUB 250-50 MCG/DOSE AEPB INHALE 1 PUFF INTO THE LUNGS 2 (TWO) TIMES DAILY. FOR COPD 180 each 1   No current facility-administered medications on file prior to visit.    BP 118/74   Pulse 75   Ht 5' 7.5" (1.715 m)   Wt 162 lb (73.5 kg)   SpO2 98%   BMI 25.00 kg/m    Objective:   Physical Exam HENT:     Right Ear: Tympanic membrane and ear canal normal.     Left Ear: Tympanic membrane and ear canal normal.  Eyes:     Pupils: Pupils are equal, round, and reactive to light.  Cardiovascular:     Rate and Rhythm: Normal rate and regular rhythm.  Pulmonary:     Effort: Pulmonary effort is normal.     Breath sounds: Normal breath sounds.  Abdominal:     General: Bowel sounds are normal.     Palpations: Abdomen is soft.     Tenderness: There is no abdominal tenderness.  Musculoskeletal:        General: Normal range of motion.     Cervical back: Neck supple.  Skin:    General: Skin is warm and dry.  Neurological:     Mental Status: She is alert and oriented to person, place, and time.     Cranial Nerves: No cranial nerve deficit.     Deep Tendon Reflexes:     Reflex Scores:      Patellar reflexes are 2+ on the right side and 2+ on the left side. Psychiatric:  Mood and Affect: Mood normal.            Assessment & Plan:

## 2020-07-04 NOTE — Assessment & Plan Note (Signed)
Following with cardiology who manages.

## 2020-07-04 NOTE — Assessment & Plan Note (Signed)
Asymptomatic.  Follow with cardiology. Continue lipid and blood pressure control.

## 2020-07-04 NOTE — Assessment & Plan Note (Signed)
Immunizations up-to-date, will get flu shot soon through the city. Bone density scan up-to-date. Declines mammogram and colonoscopy given age, agreed. Commended her on regular activity, encouraged a healthy diet.  Exam today stable. Labs pending.

## 2020-07-04 NOTE — Assessment & Plan Note (Signed)
Improved since moving to skilled nursing facility.  Continue to monitor.

## 2020-07-04 NOTE — Assessment & Plan Note (Signed)
Continue alendronate weekly.  Repeat bone density scan due next year.

## 2020-07-04 NOTE — Assessment & Plan Note (Signed)
Well-controlled in the office today, continue losartan 100 mg.

## 2020-07-04 NOTE — Assessment & Plan Note (Signed)
Very stable with walker today.  She clearly benefited from physical therapy and Occupational Therapy.  Continue to monitor.

## 2020-07-04 NOTE — Assessment & Plan Note (Signed)
Pacemaker in place, follows with cardiology and electrophysiology.

## 2020-07-04 NOTE — Assessment & Plan Note (Signed)
Chronic and progressive slowly. Her daughter today endorses that sometimes the patient does not recognize her.  Continue memantine-donepezil daily. No longer following with neurology.

## 2020-07-04 NOTE — Assessment & Plan Note (Signed)
Appears stable on exam today, no wheezing. Continue Wixela and Singulair.

## 2020-07-04 NOTE — Patient Instructions (Signed)
Stop by the lab prior to leaving today. I will notify you of your results once received.   Continue to remain active daily.  Continue to work on a healthy diet.   It was a pleasure to see you today!   Preventive Care 51 Years and Older, Female Preventive care refers to lifestyle choices and visits with your health care provider that can promote health and wellness. This includes:  A yearly physical exam. This is also called an annual well check.  Regular dental and eye exams.  Immunizations.  Screening for certain conditions.  Healthy lifestyle choices, such as diet and exercise. What can I expect for my preventive care visit? Physical exam Your health care provider will check:  Height and weight. These may be used to calculate body mass index (BMI), which is a measurement that tells if you are at a healthy weight.  Heart rate and blood pressure.  Your skin for abnormal spots. Counseling Your health care provider may ask you questions about:  Alcohol, tobacco, and drug use.  Emotional well-being.  Home and relationship well-being.  Sexual activity.  Eating habits.  History of falls.  Memory and ability to understand (cognition).  Work and work Statistician.  Pregnancy and menstrual history. What immunizations do I need?  Influenza (flu) vaccine  This is recommended every year. Tetanus, diphtheria, and pertussis (Tdap) vaccine  You may need a Td booster every 10 years. Varicella (chickenpox) vaccine  You may need this vaccine if you have not already been vaccinated. Zoster (shingles) vaccine  You may need this after age 26. Pneumococcal conjugate (PCV13) vaccine  One dose is recommended after age 16. Pneumococcal polysaccharide (PPSV23) vaccine  One dose is recommended after age 45. Measles, mumps, and rubella (MMR) vaccine  You may need at least one dose of MMR if you were born in 1957 or later. You may also need a second dose. Meningococcal  conjugate (MenACWY) vaccine  You may need this if you have certain conditions. Hepatitis A vaccine  You may need this if you have certain conditions or if you travel or work in places where you may be exposed to hepatitis A. Hepatitis B vaccine  You may need this if you have certain conditions or if you travel or work in places where you may be exposed to hepatitis B. Haemophilus influenzae type b (Hib) vaccine  You may need this if you have certain conditions. You may receive vaccines as individual doses or as more than one vaccine together in one shot (combination vaccines). Talk with your health care provider about the risks and benefits of combination vaccines. What tests do I need? Blood tests  Lipid and cholesterol levels. These may be checked every 5 years, or more frequently depending on your overall health.  Hepatitis C test.  Hepatitis B test. Screening  Lung cancer screening. You may have this screening every year starting at age 34 if you have a 30-pack-year history of smoking and currently smoke or have quit within the past 15 years.  Colorectal cancer screening. All adults should have this screening starting at age 22 and continuing until age 78. Your health care provider may recommend screening at age 71 if you are at increased risk. You will have tests every 1-10 years, depending on your results and the type of screening test.  Diabetes screening. This is done by checking your blood sugar (glucose) after you have not eaten for a while (fasting). You may have this done every 1-3 years.  Mammogram. This may be done every 1-2 years. Talk with your health care provider about how often you should have regular mammograms.  BRCA-related cancer screening. This may be done if you have a family history of breast, ovarian, tubal, or peritoneal cancers. Other tests  Sexually transmitted disease (STD) testing.  Bone density scan. This is done to screen for osteoporosis. You may  have this done starting at age 54. Follow these instructions at home: Eating and drinking  Eat a diet that includes fresh fruits and vegetables, whole grains, lean protein, and low-fat dairy products. Limit your intake of foods with high amounts of sugar, saturated fats, and salt.  Take vitamin and mineral supplements as recommended by your health care provider.  Do not drink alcohol if your health care provider tells you not to drink.  If you drink alcohol: ? Limit how much you have to 0-1 drink a day. ? Be aware of how much alcohol is in your drink. In the [place] one drink equals one 12 oz bottle of beer (355 mL), one 5 oz glass of wine (148 mL), or one 1 oz glass of hard liquor (44 mL). Lifestyle  Take daily care of your teeth and gums.  Stay active. Exercise for at least 30 minutes on 5 or more days each week.  Do not use any products that contain nicotine or tobacco, such as cigarettes, e-cigarettes, and chewing tobacco. If you need help quitting, ask your health care provider.  If you are sexually active, practice safe sex. Use a condom or other form of protection in order to prevent STIs (sexually transmitted infections).  Talk with your health care provider about taking a low-dose aspirin or statin. What's next?  Go to your health care provider once a year for a well check visit.  Ask your health care provider how often you should have your eyes and teeth checked.  Stay up to date on all vaccines. This information is not intended to replace advice given to you by your health care provider. Make sure you discuss any questions you have with your health care provider. Document Revised: 10/05/2018 Document Reviewed: 10/05/2018 Elsevier Patient Education  2020 [employer].

## 2020-07-05 LAB — COMPREHENSIVE METABOLIC PANEL
AG Ratio: 2 (calc) (ref 1.0–2.5)
ALT: 10 U/L (ref 6–29)
AST: 19 U/L (ref 10–35)
Albumin: 4.3 g/dL (ref 3.6–5.1)
Alkaline phosphatase (APISO): 91 U/L (ref 37–153)
BUN/Creatinine Ratio: 15 (calc) (ref 6–22)
BUN: 21 mg/dL (ref 7–25)
CO2: 27 mmol/L (ref 20–32)
Calcium: 8.9 mg/dL (ref 8.6–10.4)
Chloride: 105 mmol/L (ref 98–110)
Creat: 1.4 mg/dL — ABNORMAL HIGH (ref 0.60–0.88)
Globulin: 2.1 g/dL (calc) (ref 1.9–3.7)
Glucose, Bld: 89 mg/dL (ref 65–99)
Potassium: 4.4 mmol/L (ref 3.5–5.3)
Sodium: 144 mmol/L (ref 135–146)
Total Bilirubin: 0.8 mg/dL (ref 0.2–1.2)
Total Protein: 6.4 g/dL (ref 6.1–8.1)

## 2020-07-05 LAB — CBC
HCT: 41.2 % (ref 35.0–45.0)
Hemoglobin: 13.3 g/dL (ref 11.7–15.5)
MCH: 30 pg (ref 27.0–33.0)
MCHC: 32.3 g/dL (ref 32.0–36.0)
MCV: 92.8 fL (ref 80.0–100.0)
MPV: 10.9 fL (ref 7.5–12.5)
Platelets: 199 10*3/uL (ref 140–400)
RBC: 4.44 10*6/uL (ref 3.80–5.10)
RDW: 12.3 % (ref 11.0–15.0)
WBC: 4.4 10*3/uL (ref 3.8–10.8)

## 2020-07-05 LAB — LIPID PANEL
Cholesterol: 157 mg/dL (ref <200)
HDL: 52 mg/dL (ref >=50)
LDL Cholesterol (Calc): 85 mg/dL (calc)
Non-HDL Cholesterol (Calc): 105 mg/dL (calc) (ref <130)
Total CHOL/HDL Ratio: 3 (calc) (ref <5.0)
Triglycerides: 108 mg/dL (ref <150)

## 2020-07-05 LAB — TSH: TSH: 0.66 mIU/L (ref 0.40–4.50)

## 2020-07-14 ENCOUNTER — Encounter: Payer: Medicare Other | Admitting: Cardiovascular Disease

## 2020-08-01 ENCOUNTER — Ambulatory Visit (INDEPENDENT_AMBULATORY_CARE_PROVIDER_SITE_OTHER): Payer: Medicare Other

## 2020-08-01 DIAGNOSIS — I441 Atrioventricular block, second degree: Secondary | ICD-10-CM

## 2020-08-02 LAB — CUP PACEART REMOTE DEVICE CHECK
Battery Impedance: 251 Ohm
Battery Remaining Longevity: 137 mo
Battery Voltage: 2.79 V
Brady Statistic AP VP Percent: 0 %
Brady Statistic AP VS Percent: 24 %
Brady Statistic AS VP Percent: 0 %
Brady Statistic AS VS Percent: 76 %
Date Time Interrogation Session: 20211008114258
Implantable Lead Implant Date: 20160112
Implantable Lead Implant Date: 20160112
Implantable Lead Location: 753859
Implantable Lead Location: 753860
Implantable Lead Model: 5076
Implantable Lead Model: 5076
Implantable Pulse Generator Implant Date: 20160112
Lead Channel Impedance Value: 473 Ohm
Lead Channel Impedance Value: 552 Ohm
Lead Channel Pacing Threshold Amplitude: 0.875 V
Lead Channel Pacing Threshold Amplitude: 0.875 V
Lead Channel Pacing Threshold Pulse Width: 0.4 ms
Lead Channel Pacing Threshold Pulse Width: 0.4 ms
Lead Channel Setting Pacing Amplitude: 1.75 V
Lead Channel Setting Pacing Amplitude: 2 V
Lead Channel Setting Pacing Pulse Width: 0.4 ms
Lead Channel Setting Sensing Sensitivity: 2.8 mV

## 2020-08-05 NOTE — Progress Notes (Signed)
Remote pacemaker transmission.   

## 2020-08-25 ENCOUNTER — Other Ambulatory Visit: Payer: Self-pay | Admitting: Primary Care

## 2020-08-25 DIAGNOSIS — J449 Chronic obstructive pulmonary disease, unspecified: Secondary | ICD-10-CM

## 2020-09-15 ENCOUNTER — Encounter: Payer: Self-pay | Admitting: Cardiovascular Disease

## 2020-09-15 ENCOUNTER — Ambulatory Visit (INDEPENDENT_AMBULATORY_CARE_PROVIDER_SITE_OTHER): Payer: Medicare Other | Admitting: Cardiovascular Disease

## 2020-09-15 ENCOUNTER — Other Ambulatory Visit: Payer: Self-pay

## 2020-09-15 VITALS — BP 148/64 | HR 80 | Ht 67.5 in | Wt 165.0 lb

## 2020-09-15 DIAGNOSIS — Z953 Presence of xenogenic heart valve: Secondary | ICD-10-CM

## 2020-09-15 DIAGNOSIS — F0280 Dementia in other diseases classified elsewhere without behavioral disturbance: Secondary | ICD-10-CM

## 2020-09-15 DIAGNOSIS — I5032 Chronic diastolic (congestive) heart failure: Secondary | ICD-10-CM | POA: Diagnosis not present

## 2020-09-15 DIAGNOSIS — I2510 Atherosclerotic heart disease of native coronary artery without angina pectoris: Secondary | ICD-10-CM | POA: Diagnosis not present

## 2020-09-15 DIAGNOSIS — I714 Abdominal aortic aneurysm, without rupture, unspecified: Secondary | ICD-10-CM

## 2020-09-15 DIAGNOSIS — I341 Nonrheumatic mitral (valve) prolapse: Secondary | ICD-10-CM | POA: Diagnosis not present

## 2020-09-15 DIAGNOSIS — Z950 Presence of cardiac pacemaker: Secondary | ICD-10-CM

## 2020-09-15 DIAGNOSIS — I480 Paroxysmal atrial fibrillation: Secondary | ICD-10-CM

## 2020-09-15 DIAGNOSIS — G301 Alzheimer's disease with late onset: Secondary | ICD-10-CM

## 2020-09-15 DIAGNOSIS — I10 Essential (primary) hypertension: Secondary | ICD-10-CM

## 2020-09-15 DIAGNOSIS — I739 Peripheral vascular disease, unspecified: Secondary | ICD-10-CM

## 2020-09-15 DIAGNOSIS — E7800 Pure hypercholesterolemia, unspecified: Secondary | ICD-10-CM

## 2020-09-15 NOTE — Patient Instructions (Signed)

## 2020-09-15 NOTE — Progress Notes (Signed)
Cardiology office note     Date:  09/15/2020   ID:  [name], DOB 1934/02/15, MRN [id number]  PCP:  Doreene Nest, NP  Cardiologist:  [name], MD  Electrophysiologist:  None   Evaluation Performed:  Follow-Up Visit diastolic heart failure, s/p AVR and pacemaker  Chief Complaint: CHF  History of Present Illness:     As with previous visits the patient's daughter, [name], participated in the visit, due to the patient's short-term memory issues.  [name] is a 86 y.o. female with chronic heart failure with preserved left ventricular ejection fraction, history of aortic valve replacement with a biological prosthesis, mild mitral valve prolapse with mild mitral insufficiency, remote history of syncope due to Mobitz type II second-degree AV block with implantation of a dual-chamber permanent pacemaker (Medtronic, 2016), small AAA.  She is doing quite well.  She is moved to assisted living and is much more socially engaged than before.  She enjoys participating in all kinds of games and tournaments.  She is eating better and has gained weight.  The patient specifically denies any chest pain at rest exertion, dyspnea at rest or with exertion, orthopnea, paroxysmal nocturnal dyspnea, syncope, palpitations, focal neurological deficits, intermittent claudication, lower extremity edema, unexplained weight gain, cough, hemoptysis or wheezing.  Comprehensive pacemaker interrogation shows normal device function.  Her Medtronic Adapta dual-chamber device implanted in 2016 has 11.5 years of remaining longevity.  She has 23% atrial pacing and only 0.3% ventricular pacing.  The device was initially implanted for second-degree AV block but clearly she does not require a lot of ventricular pacing.  She has had occasional and very brief episodes of paroxysmal atrial fibrillation.  The most recent event occurred in October 2020 and was only 4 minutes in duration.  The overall  burden of atrial fibrillation is well under 0.1%.  In addition she has occasional episodes of paroxysmal atrial tachycardia.  Most of these are nonsustained and last for 3-10 seconds.  Occasionally she will have an episode that is sustained but the longest one was between 1 and 2 minutes in duration.  The episodes are asymptomatic and do not require treatment.  The aortic valve is a 21 mm Edwards MagnaEase bovine bioprosthesis placed in 2009. Her echocardiogram was last performed in July 2020 and shows normal left ventricular systolic function and normal gradients across the aortic valve biological prosthesis (Mean gradient 12 mmHg, dimensionless index 0.47).  Her dual-chamber Medtronic Adapta permanent pacemaker was implanted in 2016 for sinus pauses and second-degree AV block.  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     Past Medical History:  Diagnosis Date  . AAA (abdominal aortic aneurysm) (HCC) 01/03/13   3.5x3.3cm  . Alzheimer's dementia (HCC)    "probably middle stage" (11/05/2014)  . Anemia    "hx of chronic" (11/05/2014)  . Aortic stenosis 03/29/2008   biological prosthetic replacement  . Arthritis    "joints" (11/05/2014)  . Asthma   . Chronic asthmatic bronchitis (HCC)    "q fall and winter" (11/05/2014)  . Coronary artery disease    CABG 03/29/08  . Dyslipidemia   . Exertional shortness of breath   . Family history of adverse reaction to anesthesia    "daughter gets PONV too"  . Heart murmur   . Hypertension   . Hypothyroidism   . LBBB (left bundle branch block)   . Migraines    "none in years' (11/05/2014)  . Mobitz type  2 second degree AV block 10/31/2014  . MVP (mitral valve prolapse)    with mild mitral insufficiency/notes 08/17/2013  . PONV (postoperative nausea and vomiting)   . Presence of permanent cardiac pacemaker   . Sinus pause 10/31/2014  . Stroke [facility]) 03/2008   "2 light strokes"; denies residual on 11/05/2014  . Urinary frequency     Past Surgical History:  Procedure Laterality Date  . ABDOMINAL HYSTERECTOMY  1977  . AORTIC VALVE REPLACEMENT  03/29/2008   PERICARDIAL TISSUE VALVE  . APPENDECTOMY  ?1977  . CARDIAC CATHETERIZATION  ~ 2009  . CATARACT EXTRACTION W/ INTRAOCULAR LENS IMPLANT Left   . CORONARY ARTERY BYPASS GRAFT  03/29/2008   LIMA TO LAD,SVG TO CX,SVG TO LEFT POSTEROLATERAL BRANCH  . HIP FRACTURE SURGERY Right 06/2010   "3 metal screws"   . INSERT / REPLACE / REMOVE PACEMAKER  11/05/2014  . LOOP RECORDER EXPLANT N/A 11/05/2014   Procedure: LOOP RECORDER EXPLANT;  Surgeon: [name], MD;  Location: [facility];  Service: Cardiovascular;  Laterality: N/A;  . LOOP RECORDER IMPLANT N/A 09/17/2013   Procedure: LOOP RECORDER IMPLANT;  Surgeon: [name], MD;  Location: [facility];  Service: Cardiovascular;  Laterality: N/A;  . PERMANENT PACEMAKER INSERTION N/A 11/05/2014   Procedure: PERMANENT PACEMAKER INSERTION;  Surgeon: [name], MD;  Location: [facility];  Service: Cardiovascular;  Laterality: N/A;  . REFRACTIVE SURGERY Bilateral    [name] 04/28/2008 (08/17/2013)  . RETINAL DETACHMENT SURGERY Left 1994   [name] 04/10/2008 (08/17/2013)  . TIBIAL TUBERCLERPLASTY  11/05/2014  . tooth pulled   06/2018  . TOTAL HIP ARTHROPLASTY Right 07/31/2018   Procedure: RIGHT TOTAL HIP ARTHROPLASTY ANTERIOR APPROACH;  Surgeon: Gean Birchwood, MD;  Location: [facility];  Service: Orthopedics;  Laterality: Right;  . UTERINE FIBROID SURGERY  1960's     Current Meds  Medication Sig  . alendronate (FOSAMAX) 70 MG tablet TAKE 1 TAB BY MOUTH ONCE A WEEK. ON SUNDAYS, TAKE WITH A FULL GLASS OF WATER ON AN EMPTY STOMACH.  Marland Kitchen amoxicillin (AMOXIL) 500 MG tablet Take 2,000 mg by mouth See admin instructions. Take 2,000mg  by mouth 1 hour prior to dental appointments  . aspirin (ASPIRIN LOW DOSE) 81 MG EC tablet Take 2 tablets (162 mg total) by mouth daily.  Marland Kitchen atorvastatin (LIPITOR) 40 MG tablet TAKE 1 TABLET BY MOUTH ONCE DAILY  IN THEEVENING FOR CHOLESTEROL  . brimonidine (ALPHAGAN) 0.2 % ophthalmic solution Place 1 drop into the left eye 2 (two) times daily.  . cetirizine (ZYRTEC) 10 MG tablet TAKE 1 TABLET BY MOUTH ONCE DAILY  . FLUoxetine (PROZAC) 10 MG capsule TAKE 1 CAPSULE BY MOUTH DAILY FOR ANXIETY AND DEPRESSION  . fluticasone (FLONASE) 50 MCG/ACT nasal spray PLACE 1 SPRAY INTO BOTH NOSTRILS DAILY  . Fluticasone-Salmeterol (ADVAIR) 250-50 MCG/DOSE AEPB INHALE 1 PUFF INTO THE LUNGS 2 TIMES DAILY FOR COPD  . furosemide (LASIX) 80 MG tablet Please take 80 mg once a day.  . levothyroxine (SYNTHROID) 50 MCG tablet TAKE 1 TABLET BY MOUTH EVERY DAY BEFORE BREAKFAST  . losartan (COZAAR) 100 MG tablet TAKE 1 TABLET BY MOUTH ONCE DAILY FOR BLOOD PRESSURE  . Memantine HCl-Donepezil HCl (NAMZARIC) 28-10 MG CP24 Take 1 capsule by mouth daily. For memory.  . montelukast (SINGULAIR) 10 MG tablet TAKE 1 TABLET BY MOUTH EVERYDAY AT BEDTIME  . potassium chloride (KLOR-CON) 10 MEQ tablet TAKE 2 TABLETS IN THE MORNING, 3 TABLETSIN THE AFTERNOON, AND 3 TABLETS IN THE EVENING  . [  DISCONTINUED] cetirizine (ZYRTEC) 10 MG chewable tablet Chew 10 mg by mouth daily.  . [DISCONTINUED] KLOR-CON M10 10 MEQ tablet TAKE 2 TABS IN THE MORNING, 3 TABS IN THE AFTERNOON, AND 3 TABS IN THE EVENING.     Allergies:   Codeine and Morphine and related   Social History   Tobacco Use  . Smoking status: Never Smoker  . Smokeless tobacco: Never Used  Vaping Use  . Vaping Use: Never used  Substance Use Topics  . Alcohol use: No    Alcohol/week: 0.0 standard drinks  . Drug use: No     Family Hx: The patient's family history includes Diabetes in her mother; Heart failure in her mother; Hyperlipidemia in her mother; Hypertension in her mother; Melanoma in her grandchild. There is no history of Colon cancer.  ROS:   Please see the history of present illness.   All other systems are reviewed and are negative.  Prior CV studies:   The following  studies were reviewed today: Comprehensive pacemaker check in the office  Echo July 2020 1. The left ventricle has normal systolic function with an ejection  fraction of 60-65%. The cavity size was normal. Left ventricular diastolic  Doppler parameters are consistent with pseudonormalization. Elevated mean  left atrial pressure No evidence of  left ventricular regional wall motion abnormalities.  2. The right ventricle has normal systolic function. The cavity was  normal. There is no increase in right ventricular wall thickness. Right  ventricular systolic pressure is normal with an estimated pressure of 21.7  mmHg.  3. Left atrial size was moderately dilated.  4. A pericardial bioprosthesis valve is present in the aortic position.  Procedure Date: 03/29/2008.  5. When compared to the prior study: 01/26/2017, left ventricular  systolic function and aortic valve gradients are in normal range and  stable. There continues to be evidence of increased left atrial pressure.   Labs/Other Tests and Data Reviewed:    EKG:   ECG ordered today shows normal sinus rhythm, right bundle branch block plus left anterior fascicular block, no ischemic repolarization on labs, QTC 463 ms Recent Labs: 07/04/2020: ALT 10; BUN 21; Creat 1.40; Hemoglobin 13.3; Platelets 199; Potassium 4.4; Sodium 144; TSH 0.66   Recent Lipid Panel Lab Results  Component Value Date/Time   CHOL 157 07/04/2020 03:28 PM   TRIG 108 07/04/2020 03:28 PM   HDL 52 07/04/2020 03:28 PM   CHOLHDL 3.0 07/04/2020 03:28 PM   LDLCALC 85 07/04/2020 03:28 PM    Wt Readings from Last 3 Encounters:  09/15/20 165 lb (74.8 kg)  07/04/20 162 lb (73.5 kg)  03/17/20 157 lb (71.2 kg)     Objective:    Vital Signs:  BP (!) 148/64   Pulse 80   Ht 5' 7.5" (1.715 m)   Wt 165 lb (74.8 kg)   SpO2 97%   BMI 25.46 kg/m    General: Alert, oriented x3, no distress, healthy left subclavian pacemaker site Head: no evidence of trauma,  PERRL, EOMI, no exophtalmos or lid lag, no myxedema, no xanthelasma; normal ears, nose and oropharynx Neck: normal jugular venous pulsations and no hepatojugular reflux; brisk carotid pulses without delay and no carotid bruits Chest: clear to auscultation, no signs of consolidation by percussion or palpation, normal fremitus, symmetrical and full respiratory excursions Cardiovascular: normal position and quality of the apical impulse, regular rhythm, normal first and widely split second heart sounds, very faint 1/6 early systolic murmur in the aortic focus, no diastolic  murmurs, rubs or gallops Abdomen: no tenderness or distention, no masses by palpation, no abnormal pulsatility or arterial bruits, normal bowel sounds, no hepatosplenomegaly Extremities: no clubbing, cyanosis or edema; 2+ radial, ulnar and brachial pulses bilaterally; 2+ right femoral, posterior tibial and dorsalis pedis pulses; 2+ left femoral, posterior tibial and dorsalis pedis pulses; no subclavian or femoral bruits Neurological: grossly nonfocal Psych: Normal mood and affect   ASSESSMENT & PLAN:    1. Chronic diastolic heart failure (HCC)   2. S/P aortic valve replacement with bioprosthetic valve   3. Coronary artery disease involving native coronary artery of native heart without angina pectoris   4. MVP (mitral valve prolapse) with mild mitral insufficiency   5. Paroxysmal atrial fibrillation (HCC)   6. Pacemaker   7. Abdominal aortic aneurysm (AAA) without rupture (HCC)   8. PAD (peripheral artery disease) (HCC)   9. Hypercholesterolemia   10. Essential hypertension   11. Late onset Alzheimer's dementia without behavioral disturbance (HCC)      1. CHF: With preserved left ventricular systolic function.  She appears clinically euvolemic today.  She has clearly gained some true weight.  We will reset her "dry weight" at about 165 pounds.  Continue current dose of diuretics. 2. Hx AVR: Normal function clinically and by  most recent echo in July 2020. 3. CAD s/p CABG: He does not have angina pectoris 4. MVP/MR: Not yet hemodynamically significant.  Audible by physical exam today.  Appeared even less of an issue on the most recent echocardiogram 5. PAT/AFib: The coagulation was stopped due to the perceived increased risk of bleeding that advanced age and frequent falls.  The burden of atrial fibrillation is extremely low and she has never had any embolic events, stroke or TIA.  The longest episode of atrial fibrillation recorded in the last several years was only 4 minutes in duration and this happened more than a year ago.  We will keep her off anticoagulants.  Also trying to avoid beta-blockers due to her history of reactive airway disease.  The episodes of atrial tachycardia not symptomatic and did not require treatment. 6. PM: Continue remote downloads every 3 months. 7. AAA: This was relatively small at 3.7 cm and has not been reevaluated since September 2018.  We stopped doing periodic evaluation since both the patient and her daughter have expressed no interest in surgical or device repair. 8. PAD: She has some stenosis of the mid aorta and 50% bilateral common iliac artery stenosis.  She does not have intermittent claudication. 9. HLP: Most recent lipid profile showed acceptable, although not ideal LDL cholesterol (target less than 70).  Continue atorvastatin. 10. HTN: Slightly high, but not long ago she was having problems with orthostatic hypotension.  No changes made to her medications. 11. Alzheimer's dementia: With increased social involvement in her assisted living facility she seems to be doing better.  On cholinesterase inhibitors.  Due to progression of cognitive deficits, trying to avoid aggressive/invasive procedures.   COVID-19 Education: The signs and symptoms of COVID-19 were discussed with the patient and how to seek care for testing (follow up with PCP or arrange E-visit).  The importance of  social distancing was discussed today.   Medication Adjustments/Labs and Tests Ordered: Current medicines are reviewed at length with the patient today.  Concerns regarding medicines are outlined above.   Tests Ordered: Orders Placed This Encounter  Procedures  . EKG 12-Lead    Medication Changes: No orders of the defined types were placed in  this encounter.  Patient Instructions  Medication Instructions:  No changes *If you need a refill on your cardiac medications before your next appointment, please call your pharmacy*   Lab Work: None ordered If you have labs (blood work) drawn today and your tests are completely normal, you will receive your results only by: Marland Kitchen MyChart Message (if you have MyChart) OR . A paper copy in the mail If you have any lab test that is abnormal or we need to change your treatment, we will call you to review the results.   Testing/Procedures: None ordered   Follow-Up: At [facility], you and your health needs are our priority.  As part of our continuing mission to provide you with exceptional heart care, we have created designated Provider Care Teams.  These Care Teams include your primary Cardiologist (physician) and Advanced Practice Providers (APPs -  Physician Assistants and Nurse Practitioners) who all work together to provide you with the care you need, when you need it.  We recommend signing up for the patient portal called "MyChart".  Sign up information is provided on this After Visit Summary.  MyChart is used to connect with patients for Virtual Visits (Telemedicine).  Patients are able to view lab/test results, encounter notes, upcoming appointments, etc.  Non-urgent messages can be sent to your provider as well.   To learn more about what you can do with MyChart, go to [email or web address].    Your next appointment:   12 month(s)  The format for your next appointment:   In Person  Provider:   [name],  MD      Disposition:  Follow up 3 months  Signed, [name], MD  09/15/2020 5:16 PM    [facility]

## 2020-10-28 ENCOUNTER — Other Ambulatory Visit: Payer: Self-pay | Admitting: Cardiovascular Disease

## 2020-10-29 DIAGNOSIS — Z1152 Encounter for screening for COVID-19: Secondary | ICD-10-CM | POA: Diagnosis not present

## 2020-10-29 DIAGNOSIS — U071 COVID-19: Secondary | ICD-10-CM | POA: Diagnosis not present

## 2020-10-31 ENCOUNTER — Ambulatory Visit (INDEPENDENT_AMBULATORY_CARE_PROVIDER_SITE_OTHER): Payer: Medicare Other

## 2020-10-31 DIAGNOSIS — I441 Atrioventricular block, second degree: Secondary | ICD-10-CM

## 2020-10-31 LAB — CUP PACEART REMOTE DEVICE CHECK
Battery Impedance: 276 Ohm
Battery Remaining Longevity: 137 mo
Battery Voltage: 2.78 V
Brady Statistic AP VP Percent: 0 %
Brady Statistic AP VS Percent: 8 %
Brady Statistic AS VP Percent: 0 %
Brady Statistic AS VS Percent: 91 %
Date Time Interrogation Session: 20220107090247
Implantable Lead Implant Date: 20160112
Implantable Lead Implant Date: 20160112
Implantable Lead Location: 753859
Implantable Lead Location: 753860
Implantable Lead Model: 5076
Implantable Lead Model: 5076
Implantable Pulse Generator Implant Date: 20160112
Lead Channel Impedance Value: 479 Ohm
Lead Channel Impedance Value: 534 Ohm
Lead Channel Pacing Threshold Amplitude: 0.75 V
Lead Channel Pacing Threshold Amplitude: 0.875 V
Lead Channel Pacing Threshold Pulse Width: 0.4 ms
Lead Channel Pacing Threshold Pulse Width: 0.4 ms
Lead Channel Setting Pacing Amplitude: 1.5 V
Lead Channel Setting Pacing Amplitude: 2 V
Lead Channel Setting Pacing Pulse Width: 0.4 ms
Lead Channel Setting Sensing Sensitivity: 2 mV

## 2020-11-03 ENCOUNTER — Other Ambulatory Visit: Payer: Self-pay | Admitting: Primary Care

## 2020-11-03 DIAGNOSIS — J4530 Mild persistent asthma, uncomplicated: Secondary | ICD-10-CM

## 2020-11-07 DIAGNOSIS — Z1152 Encounter for screening for COVID-19: Secondary | ICD-10-CM | POA: Diagnosis not present

## 2020-11-07 DIAGNOSIS — U071 COVID-19: Secondary | ICD-10-CM | POA: Diagnosis not present

## 2020-11-14 NOTE — Progress Notes (Signed)
Remote pacemaker transmission.   

## 2020-11-22 ENCOUNTER — Other Ambulatory Visit: Payer: Self-pay | Admitting: Primary Care

## 2020-11-22 DIAGNOSIS — R059 Cough, unspecified: Secondary | ICD-10-CM

## 2020-11-22 DIAGNOSIS — J449 Chronic obstructive pulmonary disease, unspecified: Secondary | ICD-10-CM

## 2020-12-01 ENCOUNTER — Other Ambulatory Visit: Payer: Self-pay | Admitting: *Deleted

## 2020-12-01 MED ORDER — FUROSEMIDE 80 MG PO TABS: 80.0000 mg | ORAL_TABLET | Freq: Every day | ORAL | 3 refills | Status: DC

## 2020-12-22 ENCOUNTER — Other Ambulatory Visit: Payer: Self-pay | Admitting: Primary Care

## 2020-12-22 DIAGNOSIS — E039 Hypothyroidism, unspecified: Secondary | ICD-10-CM

## 2020-12-23 NOTE — Telephone Encounter (Signed)
Last visit 07/04/2020. Do you need to see in follow up?

## 2020-12-23 NOTE — Telephone Encounter (Signed)
Patient resides in SNF, do we send refill electronically now? If so then okay to send #[phone], 1 refill.

## 2020-12-24 NOTE — Telephone Encounter (Signed)
FYI,, placed in your inbox.

## 2020-12-25 NOTE — Telephone Encounter (Signed)
Called daughter checked pharmacy is correct they deliver to SNF. Refill has been sent

## 2020-12-25 NOTE — Telephone Encounter (Signed)
Ppw completed daughter informed has been faxed and per daughter request has been mailed to home address as requested.

## 2021-01-02 ENCOUNTER — Other Ambulatory Visit: Payer: Self-pay | Admitting: Primary Care

## 2021-01-02 DIAGNOSIS — J449 Chronic obstructive pulmonary disease, unspecified: Secondary | ICD-10-CM

## 2021-01-05 ENCOUNTER — Encounter: Payer: Self-pay | Admitting: Family Medicine

## 2021-01-05 ENCOUNTER — Ambulatory Visit (INDEPENDENT_AMBULATORY_CARE_PROVIDER_SITE_OTHER)
Admission: RE | Admit: 2021-01-05 | Discharge: 2021-01-05 | Disposition: A | Discharge status: Home / Self Care | Payer: Medicare Other | Source: Ambulatory Visit | Attending: Family Medicine | Admitting: Family Medicine

## 2021-01-05 ENCOUNTER — Ambulatory Visit (INDEPENDENT_AMBULATORY_CARE_PROVIDER_SITE_OTHER): Payer: Medicare Other | Admitting: Family Medicine

## 2021-01-05 ENCOUNTER — Other Ambulatory Visit: Payer: Self-pay

## 2021-01-05 VITALS — BP 124/60 | HR 86 | Temp 97.7°F | Wt 176.5 lb

## 2021-01-05 DIAGNOSIS — S99921A Unspecified injury of right foot, initial encounter: Secondary | ICD-10-CM

## 2021-01-05 DIAGNOSIS — M7989 Other specified soft tissue disorders: Secondary | ICD-10-CM | POA: Diagnosis not present

## 2021-01-05 DIAGNOSIS — M19071 Primary osteoarthritis, right ankle and foot: Secondary | ICD-10-CM | POA: Diagnosis not present

## 2021-01-05 DIAGNOSIS — Z961 Presence of intraocular lens: Secondary | ICD-10-CM | POA: Diagnosis not present

## 2021-01-05 DIAGNOSIS — S92511A Displaced fracture of proximal phalanx of right lesser toe(s), initial encounter for closed fracture: Secondary | ICD-10-CM | POA: Diagnosis not present

## 2021-01-05 DIAGNOSIS — S92524A Nondisplaced fracture of medial phalanx of right lesser toe(s), initial encounter for closed fracture: Secondary | ICD-10-CM | POA: Insufficient documentation

## 2021-01-05 DIAGNOSIS — H33052 Total retinal detachment, left eye: Secondary | ICD-10-CM | POA: Diagnosis not present

## 2021-01-05 DIAGNOSIS — H401122 Primary open-angle glaucoma, left eye, moderate stage: Secondary | ICD-10-CM | POA: Diagnosis not present

## 2021-01-05 DIAGNOSIS — H40011 Open angle with borderline findings, low risk, right eye: Secondary | ICD-10-CM | POA: Diagnosis not present

## 2021-01-05 DIAGNOSIS — H43811 Vitreous degeneration, right eye: Secondary | ICD-10-CM | POA: Diagnosis not present

## 2021-01-05 HISTORY — DX: Nondisplaced fracture of middle phalanx of right lesser toe(s), initial encounter for closed fracture: S92.524A

## 2021-01-05 NOTE — Assessment & Plan Note (Signed)
XR with fracture on my review. Discussed difficult to determine displaced. Will buddy tape and advised hard sole shoe. Follow-up final read. 2 week f/u with sports medicine or consider ortho referral if final read is displaced.

## 2021-01-05 NOTE — Patient Instructions (Signed)
Toe Fracture - buddy taping - hard sole shoe or post op shoe - Return to see Dr. Patsy Lager in 2 weeks   How to Buddy Tape Buddy taping refers to taping an injured finger or toe to an uninjured finger or toe that is next to it. This protects the injured finger or toe and reduces movement while the injury heals. You may buddy tape a finger or toe if you have a minor sprain. Your health care provider may buddy tape your finger or toe if you have a sprain, dislocation, or fracture. You may be told to replace your buddy taping as needed. What are the risks? Generally, buddy taping is safe. However, problems may occur, such as:  Skin injury or infection.  Reduced blood flow to the finger or toe.  Skin reaction to the tape. Do not buddy tape your toe if you have diabetes. Do not buddy tape if you know that you have an allergy to adhesives or surgical tape. Supplies needed:  Gauze pad, cotton, or cloth.  Tape. This may be called first-aid tape, surgical tape, or medical tape. How to buddy tape Before buddy taping Lessen any pain and swelling with rest, icing, and elevation:  Avoid activities that cause pain.  If directed, put ice on the injured area. To do this: ? Put ice in a plastic bag. ? Place a towel between your skin and the bag. ? Leave the ice on for 20 minutes, 2-3 times a day. ? Remove the ice if your skin turns bright red. This is very important. If you cannot feel pain, heat, or cold, you have a greater risk of damage to the area.  Raise (elevate) your hand or foot above the level of your heart while you are sitting or lying down.   Buddy taping procedure  Clean and dry your finger or toe as told by your health care provider.  Place a gauze pad or a piece of cloth or cotton between your injured finger or toe and the uninjured finger or toe.  Use tape to wrap around both fingers or toes so your injured finger or toe is secured to the uninjured finger or toe. ? The tape  should be snug, but not tight. ? Make sure the ends of the piece of tape overlap. ? Avoid placing tape directly over the joint.  Change the tape and the padding as told by your health care provider. Remove and replace the tape or padding if it becomes loose, worn, dirty, or wet.   After buddy taping Watch the buddy-taped area and always remove buddy taping if:  Your pain gets worse.  Your fingers or toes turn pale or blue.  Your skin becomes irritated. Follow these instructions at home:  Take over-the-counter and prescription medicines only as told by your health care provider.  Return to your normal activities as told by your health care provider. Ask your health care provider what activities are safe for you. Contact a health care provider if:  You have pain, swelling, or bruising that lasts longer than 3 days.  You have a fever.  Your skin is red, cracked, or irritated. Get help right away if:  The injured area becomes cold, numb, or pale.  You have severe pain, swelling, bruising, or loss of movement in your finger or toe.  Your finger or toe changes shape (deformity). Summary  Buddy taping refers to taping an injured finger or toe to an uninjured finger or toe that is next  to it.  You may buddy tape a finger or toe if you have a minor sprain or fracture.  Take over-the-counter and prescription medicines only as told by your health care provider. This information is not intended to replace advice given to you by your health care provider. Make sure you discuss any questions you have with your health care provider. Document Revised: 07/31/2020 Document Reviewed: 07/31/2020 Elsevier Patient Education  2021 [employer].

## 2021-01-05 NOTE — Progress Notes (Signed)
   Subjective:     [name] is a 87 y.o. female presenting for Toe Injury (R foot- 2nd toe )     HPI  #Toe injury - red and swollen - pt not sure when this started - no known injury - daughter noticed it today when they went to get her nails done - pain with palpation and wearing shows - is able to walk w/o pain  - no fever/chills  Lives in assisted living  Review of Systems   Social History   Tobacco Use  Smoking Status Never Smoker  Smokeless Tobacco Never Used        Objective:    BP Readings from Last 3 Encounters:  01/05/21 124/60  09/15/20 (!) 148/64  07/04/20 118/74   Wt Readings from Last 3 Encounters:  01/05/21 176 lb 8 oz (80.1 kg)  09/15/20 165 lb (74.8 kg)  07/04/20 162 lb (73.5 kg)    BP 124/60   Pulse 86   Temp 97.7 F (36.5 C) (Temporal)   Wt 176 lb 8 oz (80.1 kg)   SpO2 96%   BMI 27.24 kg/m    Physical Exam Constitutional:      General: She is not in acute distress.    Appearance: She is well-developed. She is not diaphoretic.  HENT:     Right Ear: External ear normal.     Left Ear: External ear normal.  Eyes:     Conjunctiva/sclera: Conjunctivae normal.  Cardiovascular:     Rate and Rhythm: Normal rate.  Pulmonary:     Effort: Pulmonary effort is normal.  Musculoskeletal:     Cervical back: Neck supple.     Comments: Right foot: 2nd digit with swelling, blanching erythema and bruising at the nailbed. No tenderness to palpation though pt w/ high pain tolerance.  Nodule on the 4th digit No pain along the foot, ankle.   Skin:    General: Skin is warm and dry.     Capillary Refill: Capillary refill takes less than 2 seconds.  Neurological:     Mental Status: She is alert. Mental status is at baseline.  Psychiatric:        Mood and Affect: Mood normal.        Behavior: Behavior normal.    My read XR: fracture of the middle phalanx of the second digit      Assessment & Plan:   Problem List Items Addressed  This Visit      Musculoskeletal and Integument   Closed nondisplaced fracture of middle phalanx of lesser toe of right foot - Primary    XR with fracture on my review. Discussed difficult to determine displaced. Will buddy tape and advised hard sole shoe. Follow-up final read. 2 week f/u with sports medicine or consider ortho referral if final read is displaced.        Other Visit Diagnoses    Injury of toe on right foot, initial encounter       Relevant Orders   DG Foot Complete Right       Return in about 2 weeks (around 01/19/2021) for Dr. Patsy Lager.  Lynnda Child, MD  This visit occurred during the SARS-CoV-2 public health emergency.  Safety protocols were in place, including screening questions prior to the visit, additional usage of staff PPE, and extensive cleaning of exam room while observing appropriate contact time as indicated for disinfecting solutions.

## 2021-01-07 ENCOUNTER — Other Ambulatory Visit: Payer: Self-pay | Admitting: Primary Care

## 2021-01-07 DIAGNOSIS — M8580 Other specified disorders of bone density and structure, unspecified site: Secondary | ICD-10-CM

## 2021-01-13 ENCOUNTER — Other Ambulatory Visit: Payer: Self-pay | Admitting: Primary Care

## 2021-01-13 DIAGNOSIS — E785 Hyperlipidemia, unspecified: Secondary | ICD-10-CM

## 2021-01-20 ENCOUNTER — Other Ambulatory Visit: Payer: Self-pay | Admitting: Primary Care

## 2021-01-21 ENCOUNTER — Ambulatory Visit: Payer: Medicare Other | Admitting: Family Medicine

## 2021-01-21 ENCOUNTER — Other Ambulatory Visit: Payer: Self-pay | Admitting: Primary Care

## 2021-01-26 ENCOUNTER — Ambulatory Visit (INDEPENDENT_AMBULATORY_CARE_PROVIDER_SITE_OTHER): Payer: Medicare Other | Admitting: Family Medicine

## 2021-01-26 ENCOUNTER — Other Ambulatory Visit: Payer: Self-pay

## 2021-01-26 ENCOUNTER — Encounter: Payer: Self-pay | Admitting: Family Medicine

## 2021-01-26 VITALS — BP 110/60 | HR 82 | Temp 98.2°F | Ht 67.5 in | Wt 176.2 lb

## 2021-01-26 DIAGNOSIS — S92524A Nondisplaced fracture of medial phalanx of right lesser toe(s), initial encounter for closed fracture: Secondary | ICD-10-CM | POA: Diagnosis not present

## 2021-01-26 DIAGNOSIS — R208 Other disturbances of skin sensation: Secondary | ICD-10-CM

## 2021-01-26 DIAGNOSIS — F0390 Unspecified dementia without behavioral disturbance: Secondary | ICD-10-CM

## 2021-01-26 NOTE — Progress Notes (Signed)
Oris Staffieri T. Germaine Shenker, MD, CAQ Sports Medicine  Primary Care and Sports Medicine [facility] at [facility] [address] [place], [address]  Phone: [phone]  FAX: [phone]  [name] - 87 y.o. female  MRN [id number]  Date of Birth: [birth year]  Date: 01/26/2021  PCP: Doreene Nest, NP  Referral: Doreene Nest, NP  Chief Complaint  Patient presents with  . Follow-up    Right Toe Fx    This visit occurred during the SARS-CoV-2 public health emergency.  Safety protocols were in place, including screening questions prior to the visit, additional usage of staff PPE, and extensive cleaning of exam room while observing appropriate contact time as indicated for disinfecting solutions.   Subjective:   [name] is a 87 y.o. very pleasant female patient with Body mass index is 27.2 kg/m. who presents with the following:  F/u R toe fx.  The patient is here in follow-up by consultation of my partner Dr. Selena Batten for toe fracture follow-up.  Primary issue is his second digit fracture at the base of the second digit, and also a secondary issue is a subacute fracture of the fifth digit with evidence of bony healing.  The patient is accompanied by her daughter who provides additional history.  She does have some some significant dementia, and she has no memory of any form of injury.  These 2 injuries to her toes do not provoke any pain.  She has been able to ambulate without difficulty.  She does live in assisted living, and they did not notice any particular altered behaviors.  She does have some swelling at the second digit.  Review of Systems is noted in the HPI, as appropriate   Objective:   BP 110/60   Pulse 82   Temp 98.2 F (36.8 C) (Temporal)   Ht 5' 7.5" (1.715 m)   Wt 176 lb 4 oz (79.9 kg)   SpO2 95%   BMI 27.20 kg/m   GEN: No acute distress; alert,appropriate. PULM: Breathing comfortably in no respiratory  distress PSYCH: Normally interactive.  She requires redirection many times, and she does repeat herself multiple times during the interview.  Right foot and ankle: There is no tenderness along the tibia and fibula.  Malleoli are nontender along with the talus, navicular, cuboid, cuneiforms as well as the hindfoot.  The entirety of the midfoot as well as the metatarsals are nontender.  She does have some obvious swelling in the second digit.  There is no bruising.  She has nontender throughout all digits of the right foot.    Radiology: DG Foot Complete Right  Result Date: 01/06/2021 CLINICAL DATA:  Second toe swelling and pain. Fourth toe abnormality. EXAM: RIGHT FOOT COMPLETE - 3+ VIEW COMPARISON:  None. FINDINGS: There is a subacute appearing, impacted fracture through the base of the proximal phalanx of the fifth digit. There is surrounding soft tissue swelling. There are degenerative changes of the interphalangeal joints. There is osteopenia which limits detection of nondisplaced fractures. There is a small plantar calcaneal spur. There is a tiny Achilles tendon enthesophyte. There is a probable nondisplaced fracture through the base of the middle phalanx of the second digit. Examination is limited by suboptimal lateral view with multiple overlapping osseous structures. IMPRESSION: 1. Subacute appearing, impacted fracture through the base of the proximal phalanx of the fifth digit. 2. Nondisplaced fracture through the base of the middle phalanx of the second digit. 3. Degenerative changes of  the interphalangeal joints. Electronically Signed   By: Katherine Mantle M.D.   On: 01/06/2021 18:08    Assessment and Plan:     ICD-10-CM   1. Closed nondisplaced fracture of middle phalanx of lesser toe of right foot, initial encounter  S92.524A   2. Decreased sensation of lower extremity  R20.8   3. Dementia without behavioral disturbance, unspecified dementia type (HCC)  F03.90    Total encounter  time: 30 minutes. This includes total time spent on the day of encounter.   I have no concern that these fractures will heal.  There is good evidence of bony healing with callus formation on the fifth digit.  We reviewed the plain x-rays today in the office together.  I would anticipate given her age and lack of awareness of her fracture that this would take longer than anticipated compared to the general population to heal.  Usually, 4 weeks is adequate, but suspect at least 6 to 8 weeks in this case.  Difficult to know if it could be longer.  I think the primary issue is lack of self-awareness, and some significant dementia, along with decreased sensation in her feet.  Talk to the patient's daughter, and I do not think that there is a great solution in this case.  I think that being aware of nonverbal cues of injury might be appropriate such as gait, restlessness, blood pressure, pulse, and irritability May give insight to injury.  No orders of the defined types were placed in this encounter.  Medications Discontinued During This Encounter  Medication Reason  . losartan (COZAAR) 100 MG tablet Duplicate  . Fluticasone-Salmeterol (ADVAIR) 250-50 MCG/DOSE AEPB Duplicate   No orders of the defined types were placed in this encounter.   Follow-up: No follow-ups on file.  Signed,  Elpidio Galea. Boy Delamater, MD   Outpatient Encounter Medications as of 01/26/2021  Medication Sig  . alendronate (FOSAMAX) 70 MG tablet TAKE 1 TABLET BY MOUTH ONCE A WEEK ON SUNDAYS. TAKE WITH A FULL GLASS OF WATERON AN EMPTY STOMACH.  Marland Kitchen amoxicillin (AMOXIL) 500 MG tablet Take 2,000 mg by mouth See admin instructions. Take 2,000mg  by mouth 1 hour prior to dental appointments  . ASPIRIN LOW DOSE 81 MG EC tablet TAKE 2 TABLETS (162 MG TOTAL) BY MOUTH DAILY  . atorvastatin (LIPITOR) 40 MG tablet Take 1 tablet (40 mg total) by mouth every evening. For cholesterol.  . brimonidine (ALPHAGAN) 0.2 % ophthalmic solution Place 1  drop into the left eye 2 (two) times daily.  . cetirizine (ZYRTEC) 10 MG tablet TAKE 1 TABLET BY MOUTH ONCE DAILY  . fluticasone (FLONASE) 50 MCG/ACT nasal spray PLACE 1 SPRAY INTO BOTH NOSTRILS DAILY  . fluticasone-salmeterol (ADVAIR HFA) 45-21 MCG/ACT inhaler Inhale 1 puff into the lungs 2 (two) times daily.  . furosemide (LASIX) 80 MG tablet Take 1 tablet (80 mg total) by mouth daily.  Marland Kitchen levothyroxine (SYNTHROID) 50 MCG tablet TAKE 1 TABLET BY MOUTH EVERY DAY BEFORE BREAKFAST  . losartan (COZAAR) 50 MG tablet Take 100 mg by mouth daily.  . Memantine HCl-Donepezil HCl (NAMZARIC) 28-10 MG CP24 Take 1 capsule by mouth daily. For memory.  . montelukast (SINGULAIR) 10 MG tablet TAKE 1 TABLET BY MOUTH EVERYDAY AT BEDTIME  . potassium chloride (KLOR-CON) 10 MEQ tablet TAKE 2 TABLETS IN THE MORNING, 3 TABLETSIN THE AFTERNOON, AND 3 TABLETS IN THE EVENING  . [DISCONTINUED] Fluticasone-Salmeterol (ADVAIR) 250-50 MCG/DOSE AEPB INHALE 1 PUFF INTO THE LUNGS 2 TIMES DAILY FOR COPD.  RINSE MOUTH AFTER USE  . [DISCONTINUED] losartan (COZAAR) 100 MG tablet TAKE 1 TABLET BY MOUTH ONCE DAILY FOR BLOOD PRESSURE  . FLUoxetine (PROZAC) 10 MG capsule TAKE 1 CAPSULE BY MOUTH DAILY FOR ANXIETY AND DEPRESSION (Patient not taking: Reported on 01/26/2021)   No facility-administered encounter medications on file as of 01/26/2021.

## 2021-01-30 ENCOUNTER — Ambulatory Visit (INDEPENDENT_AMBULATORY_CARE_PROVIDER_SITE_OTHER): Payer: Medicare Other

## 2021-01-30 DIAGNOSIS — I441 Atrioventricular block, second degree: Secondary | ICD-10-CM | POA: Diagnosis not present

## 2021-02-02 LAB — CUP PACEART REMOTE DEVICE CHECK
Battery Impedance: 299 Ohm
Battery Remaining Longevity: 133 mo
Battery Voltage: 2.78 V
Brady Statistic AP VP Percent: 0 %
Brady Statistic AP VS Percent: 11 %
Brady Statistic AS VP Percent: 0 %
Brady Statistic AS VS Percent: 89 %
Date Time Interrogation Session: 20220410163336
Implantable Lead Implant Date: 20160112
Implantable Lead Implant Date: 20160112
Implantable Lead Location: 753859
Implantable Lead Location: 753860
Implantable Lead Model: 5076
Implantable Lead Model: 5076
Implantable Pulse Generator Implant Date: 20160112
Lead Channel Impedance Value: 493 Ohm
Lead Channel Impedance Value: 546 Ohm
Lead Channel Pacing Threshold Amplitude: 0.625 V
Lead Channel Pacing Threshold Amplitude: 0.875 V
Lead Channel Pacing Threshold Pulse Width: 0.4 ms
Lead Channel Pacing Threshold Pulse Width: 0.4 ms
Lead Channel Setting Pacing Amplitude: 1.5 V
Lead Channel Setting Pacing Amplitude: 2 V
Lead Channel Setting Pacing Pulse Width: 0.4 ms
Lead Channel Setting Sensing Sensitivity: 2.8 mV

## 2021-02-12 ENCOUNTER — Other Ambulatory Visit: Payer: Self-pay | Admitting: Cardiovascular Disease

## 2021-02-12 NOTE — Progress Notes (Signed)
Remote pacemaker transmission.   

## 2021-04-30 ENCOUNTER — Other Ambulatory Visit: Payer: Self-pay | Admitting: Primary Care

## 2021-04-30 DIAGNOSIS — J4530 Mild persistent asthma, uncomplicated: Secondary | ICD-10-CM

## 2021-05-01 ENCOUNTER — Ambulatory Visit (INDEPENDENT_AMBULATORY_CARE_PROVIDER_SITE_OTHER): Payer: Medicare Other

## 2021-05-01 DIAGNOSIS — I441 Atrioventricular block, second degree: Secondary | ICD-10-CM | POA: Diagnosis not present

## 2021-05-04 LAB — CUP PACEART REMOTE DEVICE CHECK
Battery Impedance: 323 Ohm
Battery Remaining Longevity: 129 mo
Battery Voltage: 2.78 V
Brady Statistic AP VP Percent: 0 %
Brady Statistic AP VS Percent: 13 %
Brady Statistic AS VP Percent: 0 %
Brady Statistic AS VS Percent: 87 %
Date Time Interrogation Session: 20220711105830
Implantable Lead Implant Date: 20160112
Implantable Lead Implant Date: 20160112
Implantable Lead Location: 753859
Implantable Lead Location: 753860
Implantable Lead Model: 5076
Implantable Lead Model: 5076
Implantable Pulse Generator Implant Date: 20160112
Lead Channel Impedance Value: 501 Ohm
Lead Channel Impedance Value: 603 Ohm
Lead Channel Pacing Threshold Amplitude: 0.75 V
Lead Channel Pacing Threshold Amplitude: 0.875 V
Lead Channel Pacing Threshold Pulse Width: 0.4 ms
Lead Channel Pacing Threshold Pulse Width: 0.4 ms
Lead Channel Setting Pacing Amplitude: 1.75 V
Lead Channel Setting Pacing Amplitude: 2 V
Lead Channel Setting Pacing Pulse Width: 0.4 ms
Lead Channel Setting Sensing Sensitivity: 2.8 mV

## 2021-05-16 ENCOUNTER — Other Ambulatory Visit: Payer: Self-pay | Admitting: Cardiovascular Disease

## 2021-05-20 ENCOUNTER — Other Ambulatory Visit: Payer: Self-pay | Admitting: Primary Care

## 2021-05-20 DIAGNOSIS — J309 Allergic rhinitis, unspecified: Secondary | ICD-10-CM

## 2021-05-22 ENCOUNTER — Other Ambulatory Visit: Payer: Self-pay | Admitting: Primary Care

## 2021-05-22 DIAGNOSIS — J449 Chronic obstructive pulmonary disease, unspecified: Secondary | ICD-10-CM

## 2021-05-22 DIAGNOSIS — R059 Cough, unspecified: Secondary | ICD-10-CM

## 2021-05-22 NOTE — Progress Notes (Signed)
Remote pacemaker transmission.   

## 2021-06-08 ENCOUNTER — Other Ambulatory Visit: Payer: Self-pay | Admitting: Primary Care

## 2021-06-08 DIAGNOSIS — F0280 Dementia in other diseases classified elsewhere without behavioral disturbance: Secondary | ICD-10-CM

## 2021-06-09 NOTE — Telephone Encounter (Signed)
Is it ok to refill looks like patient was set to follow up next month but was changed to November. Last time seen by kate was 9/21

## 2021-06-22 ENCOUNTER — Telehealth: Payer: Self-pay | Admitting: Primary Care

## 2021-06-22 DIAGNOSIS — F0280 Dementia in other diseases classified elsewhere without behavioral disturbance: Secondary | ICD-10-CM

## 2021-06-22 NOTE — Telephone Encounter (Signed)
Candise Bowens called in and stated that the compound drug Namzaric is on back order and wanted to know if they wanted to spilt it into the two drugs to make the medication

## 2021-06-23 MED ORDER — MEMANTINE HCL ER 28 MG PO CP24
28.0000 mg | ORAL_CAPSULE | Freq: Every day | ORAL | 0 refills | Status: DC
Start: 2021-06-23 — End: 2021-08-30

## 2021-06-23 MED ORDER — DONEPEZIL HCL 10 MG PO TABS: 10.0000 mg | ORAL_TABLET | Freq: Every day | ORAL | 0 refills | Status: DC

## 2021-06-23 NOTE — Telephone Encounter (Signed)
Called daughter to let know about change. She will give me a call If any issues getting filled.

## 2021-06-23 NOTE — Telephone Encounter (Signed)
Noted. Rx separated to memantine ER 28 mg and donepezil 10 mg and sent to [facility].  Please call daughter and notify that the Rx for Namzaric is on backorder and that the pharmacy asked for [place] to separate the medications.   Namzaric is two medications in one pill. Not sure if insurance will cover.

## 2021-06-25 ENCOUNTER — Other Ambulatory Visit: Payer: Self-pay | Admitting: Primary Care

## 2021-06-25 DIAGNOSIS — I10 Essential (primary) hypertension: Secondary | ICD-10-CM

## 2021-06-26 NOTE — Telephone Encounter (Signed)
Refills sent to pharmacy. 

## 2021-07-06 ENCOUNTER — Encounter: Payer: Medicare Other | Admitting: Primary Care

## 2021-07-06 DIAGNOSIS — Z961 Presence of intraocular lens: Secondary | ICD-10-CM | POA: Diagnosis not present

## 2021-07-06 DIAGNOSIS — H40011 Open angle with borderline findings, low risk, right eye: Secondary | ICD-10-CM | POA: Diagnosis not present

## 2021-07-06 DIAGNOSIS — H401122 Primary open-angle glaucoma, left eye, moderate stage: Secondary | ICD-10-CM | POA: Diagnosis not present

## 2021-07-06 DIAGNOSIS — H43811 Vitreous degeneration, right eye: Secondary | ICD-10-CM | POA: Diagnosis not present

## 2021-07-06 DIAGNOSIS — H33052 Total retinal detachment, left eye: Secondary | ICD-10-CM | POA: Diagnosis not present

## 2021-08-10 ENCOUNTER — Other Ambulatory Visit: Payer: Self-pay | Admitting: Primary Care

## 2021-08-10 ENCOUNTER — Ambulatory Visit (INDEPENDENT_AMBULATORY_CARE_PROVIDER_SITE_OTHER): Payer: Medicare Other

## 2021-08-10 DIAGNOSIS — I441 Atrioventricular block, second degree: Secondary | ICD-10-CM

## 2021-08-10 DIAGNOSIS — E785 Hyperlipidemia, unspecified: Secondary | ICD-10-CM

## 2021-08-11 LAB — CUP PACEART REMOTE DEVICE CHECK
Battery Impedance: 323 Ohm
Battery Remaining Longevity: 129 mo
Battery Voltage: 2.78 V
Brady Statistic AP VP Percent: 0 %
Brady Statistic AP VS Percent: 15 %
Brady Statistic AS VP Percent: 0 %
Brady Statistic AS VS Percent: 84 %
Date Time Interrogation Session: 20221016160224
Implantable Lead Implant Date: 20160112
Implantable Lead Implant Date: 20160112
Implantable Lead Location: 753859
Implantable Lead Location: 753860
Implantable Lead Model: 5076
Implantable Lead Model: 5076
Implantable Pulse Generator Implant Date: 20160112
Lead Channel Impedance Value: 446 Ohm
Lead Channel Impedance Value: 498 Ohm
Lead Channel Pacing Threshold Amplitude: 0.75 V
Lead Channel Pacing Threshold Amplitude: 0.875 V
Lead Channel Pacing Threshold Pulse Width: 0.4 ms
Lead Channel Pacing Threshold Pulse Width: 0.4 ms
Lead Channel Setting Pacing Amplitude: 1.5 V
Lead Channel Setting Pacing Amplitude: 2 V
Lead Channel Setting Pacing Pulse Width: 0.4 ms
Lead Channel Setting Sensing Sensitivity: 2 mV

## 2021-08-15 ENCOUNTER — Other Ambulatory Visit: Payer: Self-pay | Admitting: Primary Care

## 2021-08-15 DIAGNOSIS — M8580 Other specified disorders of bone density and structure, unspecified site: Secondary | ICD-10-CM

## 2021-08-15 DIAGNOSIS — E039 Hypothyroidism, unspecified: Secondary | ICD-10-CM

## 2021-08-18 NOTE — Progress Notes (Signed)
Remote pacemaker transmission.   

## 2021-08-24 ENCOUNTER — Telehealth: Payer: Self-pay

## 2021-08-24 NOTE — Telephone Encounter (Signed)
Received [facility]w from [facility]. Called l/m for daughter to call. Just wanted to let her know that we have received ppw from [facility]. Has follow up with Jae Dire soon and would like to hold forms to make sure we have updated needs for patient to fill out right.

## 2021-08-25 ENCOUNTER — Other Ambulatory Visit: Payer: Self-pay | Admitting: Primary Care

## 2021-08-25 DIAGNOSIS — J4530 Mild persistent asthma, uncomplicated: Secondary | ICD-10-CM

## 2021-08-25 NOTE — Telephone Encounter (Signed)
Pt daughter returning call and would like for you to call her back, I told her why you called but she said she didn't know what it was about.

## 2021-08-27 NOTE — Telephone Encounter (Signed)
Called daughter back left message to inform that forms are just the  for her facility that she is in. No further action needed from her just wanted to let her know. If any questions she can call our office back.

## 2021-08-29 ENCOUNTER — Other Ambulatory Visit: Payer: Self-pay | Admitting: Primary Care

## 2021-08-29 DIAGNOSIS — F0280 Dementia in other diseases classified elsewhere without behavioral disturbance: Secondary | ICD-10-CM

## 2021-08-29 DIAGNOSIS — R059 Cough, unspecified: Secondary | ICD-10-CM

## 2021-08-29 DIAGNOSIS — J449 Chronic obstructive pulmonary disease, unspecified: Secondary | ICD-10-CM

## 2021-08-31 ENCOUNTER — Other Ambulatory Visit: Payer: Self-pay | Admitting: Cardiovascular Disease

## 2021-09-03 ENCOUNTER — Encounter: Payer: Medicare Other | Admitting: Primary Care

## 2021-09-04 ENCOUNTER — Other Ambulatory Visit: Payer: Self-pay | Admitting: Primary Care

## 2021-09-04 DIAGNOSIS — I10 Essential (primary) hypertension: Secondary | ICD-10-CM

## 2021-09-09 ENCOUNTER — Ambulatory Visit (INDEPENDENT_AMBULATORY_CARE_PROVIDER_SITE_OTHER): Payer: Medicare Other | Admitting: Primary Care

## 2021-09-09 ENCOUNTER — Encounter: Payer: Self-pay | Admitting: Primary Care

## 2021-09-09 ENCOUNTER — Other Ambulatory Visit: Payer: Self-pay

## 2021-09-09 VITALS — BP 124/72 | HR 91 | Temp 97.6°F | Ht 67.5 in | Wt 174.0 lb

## 2021-09-09 DIAGNOSIS — E2839 Other primary ovarian failure: Secondary | ICD-10-CM

## 2021-09-09 DIAGNOSIS — R059 Cough, unspecified: Secondary | ICD-10-CM

## 2021-09-09 DIAGNOSIS — Z0000 Encounter for general adult medical examination without abnormal findings: Secondary | ICD-10-CM | POA: Diagnosis not present

## 2021-09-09 DIAGNOSIS — N1832 Chronic kidney disease, stage 3b: Secondary | ICD-10-CM

## 2021-09-09 DIAGNOSIS — I10 Essential (primary) hypertension: Secondary | ICD-10-CM | POA: Diagnosis not present

## 2021-09-09 DIAGNOSIS — I714 Abdominal aortic aneurysm, without rupture, unspecified: Secondary | ICD-10-CM | POA: Diagnosis not present

## 2021-09-09 DIAGNOSIS — M810 Age-related osteoporosis without current pathological fracture: Secondary | ICD-10-CM | POA: Diagnosis not present

## 2021-09-09 DIAGNOSIS — I341 Nonrheumatic mitral (valve) prolapse: Secondary | ICD-10-CM | POA: Diagnosis not present

## 2021-09-09 DIAGNOSIS — E039 Hypothyroidism, unspecified: Secondary | ICD-10-CM | POA: Diagnosis not present

## 2021-09-09 DIAGNOSIS — F03A0 Unspecified dementia, mild, without behavioral disturbance, psychotic disturbance, mood disturbance, and anxiety: Secondary | ICD-10-CM

## 2021-09-09 DIAGNOSIS — F0280 Dementia in other diseases classified elsewhere without behavioral disturbance: Secondary | ICD-10-CM

## 2021-09-09 DIAGNOSIS — E7800 Pure hypercholesterolemia, unspecified: Secondary | ICD-10-CM | POA: Diagnosis not present

## 2021-09-09 DIAGNOSIS — J4530 Mild persistent asthma, uncomplicated: Secondary | ICD-10-CM

## 2021-09-09 DIAGNOSIS — I471 Supraventricular tachycardia: Secondary | ICD-10-CM

## 2021-09-09 DIAGNOSIS — Z950 Presence of cardiac pacemaker: Secondary | ICD-10-CM

## 2021-09-09 DIAGNOSIS — I2510 Atherosclerotic heart disease of native coronary artery without angina pectoris: Secondary | ICD-10-CM | POA: Diagnosis not present

## 2021-09-09 DIAGNOSIS — F3342 Major depressive disorder, recurrent, in full remission: Secondary | ICD-10-CM

## 2021-09-09 DIAGNOSIS — J449 Chronic obstructive pulmonary disease, unspecified: Secondary | ICD-10-CM

## 2021-09-09 DIAGNOSIS — G301 Alzheimer's disease with late onset: Secondary | ICD-10-CM

## 2021-09-09 DIAGNOSIS — E785 Hyperlipidemia, unspecified: Secondary | ICD-10-CM

## 2021-09-09 MED ORDER — NAMZARIC 28-10 MG PO CP24: 1.0000 | ORAL_CAPSULE | Freq: Every day | ORAL | 3 refills | Status: DC

## 2021-09-09 MED ORDER — LOSARTAN POTASSIUM 100 MG PO TABS: 100.0000 mg | ORAL_TABLET | Freq: Every day | ORAL | 3 refills | Status: DC

## 2021-09-09 MED ORDER — MONTELUKAST SODIUM 10 MG PO TABS: ORAL_TABLET | ORAL | 3 refills | Status: DC

## 2021-09-09 MED ORDER — ATORVASTATIN CALCIUM 40 MG PO TABS: 40.0000 mg | ORAL_TABLET | Freq: Every day | ORAL | 3 refills | Status: DC

## 2021-09-09 MED ORDER — FLUTICASONE PROPIONATE 50 MCG/ACT NA SUSP: 1.0000 | Freq: Every day | NASAL | 3 refills | Status: DC

## 2021-09-09 NOTE — Assessment & Plan Note (Signed)
Reviewed remote pacemaker check from 08/17/21.

## 2021-09-09 NOTE — Assessment & Plan Note (Signed)
Stable according to prior labs. Repeat renal function pending

## 2021-09-09 NOTE — Patient Instructions (Signed)
Stop by the lab prior to leaving today. I will notify you of your results once received.   Call [facility] to schedule your bone density scan.   It was a pleasure to see you today!  Preventive Care 38 Years and Older, Female Preventive care refers to lifestyle choices and visits with your health care provider that can promote health and wellness. Preventive care visits are also called wellness exams. What can I expect for my preventive care visit? Counseling Your health care provider may ask you questions about your: Medical history, including: Past medical problems. Family medical history. Pregnancy and menstrual history. History of falls. Current health, including: Memory and ability to understand (cognition). Emotional well-being. Home life and relationship well-being. Sexual activity and sexual health. Lifestyle, including: Alcohol, nicotine or tobacco, and drug use. Access to firearms. Diet, exercise, and sleep habits. Work and work Statistician. Sunscreen use. Safety issues such as seatbelt and bike helmet use. Physical exam Your health care provider will check your: Height and weight. These may be used to calculate your BMI (body mass index). BMI is a measurement that tells if you are at a healthy weight. Waist circumference. This measures the distance around your waistline. This measurement also tells if you are at a healthy weight and may help predict your risk of certain diseases, such as type 2 diabetes and high blood pressure. Heart rate and blood pressure. Body temperature. Skin for abnormal spots. What immunizations do I need? Vaccines are usually given at various ages, according to a schedule. Your health care provider will recommend vaccines for you based on your age, medical history, and lifestyle or other factors, such as travel or where you work. What tests do I need? Screening Your health care provider may recommend screening tests for certain conditions. This may  include: Lipid and cholesterol levels. Hepatitis C test. Hepatitis B test. HIV (human immunodeficiency virus) test. STI (sexually transmitted infection) testing, if you are at risk. Lung cancer screening. Colorectal cancer screening. Diabetes screening. This is done by checking your blood sugar (glucose) after you have not eaten for a while (fasting). Mammogram. Talk with your health care provider about how often you should have regular mammograms. BRCA-related cancer screening. This may be done if you have a family history of breast, ovarian, tubal, or peritoneal cancers. Bone density scan. This is done to screen for osteoporosis. Talk with your health care provider about your test results, treatment options, and if necessary, the need for more tests. Follow these instructions at home: Eating and drinking  Eat a diet that includes fresh fruits and vegetables, whole grains, lean protein, and low-fat dairy products. Limit your intake of foods with high amounts of sugar, saturated fats, and salt. Take vitamin and mineral supplements as recommended by your health care provider. Do not drink alcohol if your health care provider tells you not to drink. If you drink alcohol: Limit how much you have to 0-1 drink a day. Know how much alcohol is in your drink. In the [place] one drink equals one 12 oz bottle of beer (355 mL), one 5 oz glass of wine (148 mL), or one 1 oz glass of hard liquor (44 mL). Lifestyle Brush your teeth every morning and night with fluoride toothpaste. Floss one time each day. Exercise for at least 30 minutes 5 or more days each week. Do not use any products that contain nicotine or tobacco. These products include cigarettes, chewing tobacco, and vaping devices, such as e-cigarettes. If you need help  quitting, ask your health care provider. Do not use drugs. If you are sexually active, practice safe sex. Use a condom or other form of protection in order to prevent STIs. Take  aspirin only as told by your health care provider. Make sure that you understand how much to take and what form to take. Work with your health care provider to find out whether it is safe and beneficial for you to take aspirin daily. Ask your health care provider if you need to take a cholesterol-lowering medicine (statin). Find healthy ways to manage stress, such as: Meditation, yoga, or listening to music. Journaling. Talking to a trusted person. Spending time with friends and family. Minimize exposure to UV radiation to reduce your risk of skin cancer. Safety Always wear your seat belt while driving or riding in a vehicle. Do not drive: If you have been drinking alcohol. Do not ride with someone who has been drinking. When you are tired or distracted. While texting. If you have been using any mind-altering substances or drugs. Wear a helmet and other protective equipment during sports activities. If you have firearms in your house, make sure you follow all gun safety procedures. What's next? Visit your health care provider once a year for an annual wellness visit. Ask your health care provider how often you should have your eyes and teeth checked. Stay up to date on all vaccines. This information is not intended to replace advice given to you by your health care provider. Make sure you discuss any questions you have with your health care provider. Document Revised: 04/08/2021 Document Reviewed: 04/08/2021 Elsevier Patient Education  [place].

## 2021-09-09 NOTE — Assessment & Plan Note (Signed)
Asymptomatic. Continue statin, aspirin, BP control.  Follows with cardiology

## 2021-09-09 NOTE — Progress Notes (Signed)
Subjective:    Patient ID: [name], female    DOB: 12-30-1933, 87 y.o.   MRN: [id number]  HPI  [name] is a very pleasant 87 y.o. female who presents today for complete physical and follow up of chronic conditions. She is residing at [facility]. She is with her daughter today.  Her daughter would like to point out a few moles that she would like evaluated.  Immunizations: -Tetanus: 2012 -Influenza: Completed this season  -Covid-19: 1 vaccine -Shingles: Zostavax and Shingrix -Pneumonia: Prevnar 13 in 2015, pneumovax in 2011  Diet: [place].  Exercise: No regular exercise. Some walking.   Eye exam: Completes annually  Dental exam: Completes semi-annually   Mammogram: Completed in 2017, declines given age Dexa: Completed in 2020 Colonoscopy: N/A given age   BP Readings from Last 3 Encounters:  09/09/21 124/72  01/26/21 110/60  01/05/21 124/60      Review of Systems  Constitutional:  Negative for unexpected weight change.  HENT:  Negative for rhinorrhea.   Respiratory:  Negative for cough and shortness of breath.   Cardiovascular:  Negative for chest pain.  Gastrointestinal:  Negative for constipation and diarrhea.  Genitourinary:  Negative for difficulty urinating.  Musculoskeletal:  Negative for arthralgias and myalgias.  Skin:  Negative for rash.  Allergic/Immunologic: Positive for environmental allergies.  Neurological:  Negative for dizziness and headaches.  Psychiatric/Behavioral:  The patient is not nervous/anxious.         Past Medical History:  Diagnosis Date   AAA (abdominal aortic aneurysm) ([place]) 01/03/13   3.5x3.3cm   Alzheimer's dementia ([place])    "probably middle stage" (11/05/2014)   Anemia    "hx of chronic" (11/05/2014)   Aortic stenosis 03/29/2008   biological prosthetic replacement   Arthritis    "joints" (11/05/2014)   Asthma    Chronic asthmatic bronchitis ([place])    "q fall and winter" (11/05/2014)    Coronary artery disease    CABG 03/29/08   Dyslipidemia    Exertional shortness of breath    Family history of adverse reaction to anesthesia    "daughter gets PONV too"   Heart murmur    Hypertension    Hypothyroidism    LBBB (left bundle branch block)    Migraines    "none in years' (11/05/2014)   Mobitz type 2 second degree AV block 10/31/2014   MVP (mitral valve prolapse)    with mild mitral insufficiency/notes 08/17/2013   PONV (postoperative nausea and vomiting)    Presence of permanent cardiac pacemaker    Sinus pause 10/31/2014   Stroke ([place]) 03/2008   "2 light strokes"; denies residual on 11/05/2014   Urinary frequency     Social History   Socioeconomic History   Marital status: Divorced    Spouse name: Not on file   Number of children: 1   Years of education: Some college   Highest education level: Not on file  Occupational History   Occupation: Retired  Tobacco Use   Smoking status: Never   Smokeless tobacco: Never  Vaping Use   Vaping Use: Never used  Substance and Sexual Activity   Alcohol use: No    Alcohol/week: 0.0 standard drinks   Drug use: No   Sexual activity: Not Currently  Other Topics Concern   Not on file  Social History Narrative   Pt lives in single story home with her daughter and son-in-law   Has 1 child   Some college  education   Retired Clinical cytogeneticist for the city of [place] Strain: Not on [employer] Insecurity: Not on file  Transportation Needs: Not on file  Physical Activity: Not on file  Stress: Not on file  Social Connections: Not on file  Intimate Partner Violence: Not on file    Past Surgical History:  Procedure Laterality Date   [place]  03/29/2008   PERICARDIAL TISSUE VALVE   APPENDECTOMY  ?1977   CARDIAC CATHETERIZATION  ~ 2009   CATARACT EXTRACTION W/ INTRAOCULAR LENS IMPLANT Left    CORONARY  ARTERY BYPASS GRAFT  03/29/2008   LIMA TO LAD,SVG TO CX,SVG TO LEFT POSTEROLATERAL BRANCH   HIP FRACTURE SURGERY Right 06/2010   "3 metal screws"    INSERT / REPLACE / REMOVE PACEMAKER  11/05/2014   LOOP RECORDER EXPLANT N/A 11/05/2014   Procedure: LOOP RECORDER EXPLANT;  Surgeon: Sanda Klein, MD;  Location: [facility] CATH [facility];  Service: Cardiovascular;  Laterality: N/A;   LOOP RECORDER IMPLANT N/A 09/17/2013   Procedure: LOOP RECORDER IMPLANT;  Surgeon: Sanda Klein, MD;  Location: [facility] C[facility];  Service: Cardiovascular;  Laterality: N/A;   PERMANENT PACEMAKER INSERTION N/A 11/05/2014   Procedure: PERMANENT PACEMAKER INSERTION;  Surgeon: Sanda Klein, MD;  Location: [facility] C[facility];  Service: Cardiovascular;  Laterality: N/A;   REFRACTIVE SURGERY Bilateral    /notes 04/28/2008 (08/17/2013)   RETINAL DETACHMENT SURGERY Left 1994   Archie Endo 04/10/2008 (08/17/2013)   TIBIAL TUBERCLERPLASTY  11/05/2014   tooth pulled   06/2018   TOTAL HIP ARTHROPLASTY Right 07/31/2018   Procedure: RIGHT TOTAL HIP ARTHROPLASTY ANTERIOR APPROACH;  Surgeon: Frederik Pear, MD;  Location: [facility];  Service: Orthopedics;  Laterality: Right;   UTERINE FIBROID SURGERY  108's    Family History  Problem Relation Age of Onset   Heart failure Mother    Diabetes Mother    Hypertension Mother    Hyperlipidemia Mother    Melanoma Grandchild    Colon cancer Neg Hx     Allergies  Allergen Reactions   Codeine Nausea And Vomiting and Other (See Comments)    Patient gets violently ill   Morphine And Related Nausea And Vomiting and Other (See Comments)    Patient get violently ill    Current Outpatient Medications on File Prior to Visit  Medication Sig Dispense Refill   alendronate (FOSAMAX) 70 MG tablet Take 1 tablet (70 mg total) by mouth once a week. Take with full glass of water and no food. Avoid laying down for 2 hours. Office visit required for further refills. 12 tablet 0   amoxicillin (AMOXIL) 500 MG tablet Take 2,000 mg by  mouth See admin instructions. Take 2,000mg  by mouth 1 hour prior to dental appointments  1   ASPIRIN LOW DOSE 81 MG EC tablet TAKE 2 TABLETS (162 MG TOTAL) BY MOUTH DAILY 180 tablet 3   atorvastatin (LIPITOR) 40 MG tablet Take 1 tablet (40 mg total) by mouth daily. For cholesterol. Office visit required for further refills. 30 tablet 0   brimonidine (ALPHAGAN) 0.2 % ophthalmic solution Place 1 drop into the left eye 2 (two) times daily.  3   cetirizine (ZYRTEC) 10 MG tablet TAKE 1 TABLET BY MOUTH ONCE DAILY as needed for allergies. 90 tablet 3   donepezil (ARICEPT) 10 MG tablet Take 1 tablet (10 mg total) by mouth daily. For memory. [place]  tablet 0   fluticasone (FLONASE) 50 MCG/ACT nasal spray PLACE 1 SPRAY INTO BOTH NOSTRILS DAILY 16 g 0   furosemide (LASIX) 80 MG tablet Take 1 tablet (80 mg total) by mouth daily. 90 tablet 3   levothyroxine (SYNTHROID) 50 MCG tablet Take 1 tablet by mouth every morning on an empty stomach with water only.  No food or other medications for 30 minutes.  Office visit required for further refills. 90 tablet 0   losartan (COZAAR) 100 MG tablet TAKE 1 TABLET BY MOUTH ONCE DAILY FOR BLOOD PRESSURE 90 tablet 0   Memantine HCl-Donepezil HCl (NAMZARIC) 28-10 MG CP24 Take 1 capsule by mouth at bedtime.     montelukast (SINGULAIR) 10 MG tablet TAKE 1 TABLET BY MOUTH EVERYDAY AT BEDTIME for cough. Office visit required for further refills. 30 tablet 0   potassium chloride (KLOR-CON) 10 MEQ tablet TAKE 2 TABLETS IN THE MORNING, 3 TABLETSIN THE AFTERNOON, AND 3 TABLETS IN THE EVENING 240 tablet 0   FLUoxetine (PROZAC) 10 MG capsule TAKE 1 CAPSULE BY MOUTH DAILY FOR ANXIETY AND DEPRESSION (Patient not taking: Reported on 09/09/2021) 90 capsule 1   fluticasone-salmeterol (ADVAIR HFA) 45-21 MCG/ACT inhaler Inhale 1 puff into the lungs 2 (two) times daily. (Patient not taking: Reported on 09/09/2021)     memantine (NAMENDA XR) 28 MG CP24 24 hr capsule Take 1 capsule (28 mg total) by  mouth daily. For memory. Office visit required for further refills. (Patient not taking: Reported on 09/09/2021) 30 capsule 0   No current facility-administered medications on file prior to visit.    BP 124/72   Pulse 91   Temp 97.6 F (36.4 C) (Temporal)   Ht 5' 7.5" (1.715 m)   Wt 174 lb (78.9 kg)   SpO2 96%   BMI 26.85 kg/m  Objective:   Physical Exam HENT:     Right Ear: Tympanic membrane and ear canal normal.     Left Ear: Tympanic membrane and ear canal normal.     Nose: Nose normal.  Eyes:     Conjunctiva/sclera: Conjunctivae normal.     Pupils: Pupils are equal, round, and reactive to light.  Neck:     Thyroid: No thyromegaly.  Cardiovascular:     Rate and Rhythm: Normal rate and regular rhythm.     Heart sounds: No murmur heard. Pulmonary:     Effort: Pulmonary effort is normal.     Breath sounds: Normal breath sounds. No rales.  Abdominal:     General: Bowel sounds are normal.     Palpations: Abdomen is soft.     Tenderness: There is no abdominal tenderness.  Musculoskeletal:        General: Normal range of motion.     Cervical back: Neck supple.  Lymphadenopathy:     Cervical: No cervical adenopathy.  Skin:    General: Skin is warm and dry.     Findings: No rash.     Comments: Nevi  Neurological:     Mental Status: She is alert and oriented to person, place, and time.     Cranial Nerves: No cranial nerve deficit.     Deep Tendon Reflexes: Reflexes are normal and symmetric.     Comments: Answers most questions correctly   Psychiatric:        Mood and Affect: Mood normal.          Assessment & Plan:      This visit occurred during the SARS-CoV-2 public health emergency.  Safety protocols were in place, including screening questions prior to the visit, additional usage of staff PPE, and extensive cleaning of exam room while observing appropriate contact time as indicated for disinfecting solutions.

## 2021-09-09 NOTE — Assessment & Plan Note (Signed)
Not noted on Echo from 2020.

## 2021-09-09 NOTE — Assessment & Plan Note (Signed)
Overall appears to be stable, no significant changes since our visit last year. She was able to largely participate during our visit today.  Continue Namenda XR 28 mg, donepezil 10 mg.

## 2021-09-09 NOTE — Assessment & Plan Note (Signed)
Compliant to atorvastatin 40 mg, continue same. °Repeat lipid panel pending.  °

## 2021-09-09 NOTE — Assessment & Plan Note (Signed)
Immunizations UTD. Declines mammogram given age. Colon cancer screening N/A given age. Bone density scan due and pending.  Discussed the importance of a healthy diet and regular exercise in order for weight loss, and to reduce the risk of further co-morbidity.  Exam today stable. Labs pending.

## 2021-09-09 NOTE — Assessment & Plan Note (Signed)
Rate well controlled today. Reviewed pacemaker remote check from 08/17/21.

## 2021-09-09 NOTE — Assessment & Plan Note (Signed)
Controlled in the office today, continue losartan 100 mg, furosemide 80 mg daily.  CMP pending.

## 2021-09-09 NOTE — Assessment & Plan Note (Addendum)
Stable. No concerns today. Continue to monitor.   Continue Advair 45-21 BID. Continue Flonase, Singulair 10 mg.

## 2021-09-09 NOTE — Assessment & Plan Note (Signed)
From [facility]  it doesn't appear that she has been getting her fluoxetine 10 mg. Patient denies concerns for depression or anxiety today.  Will have her remain off for now.

## 2021-09-09 NOTE — Assessment & Plan Note (Signed)
Levothyroxine 50 mcg provided to her at [facility], not sure if they are providing this correctly.   Repeat TSH pending. Continue levothyroxine 50 mcg for now.

## 2021-09-09 NOTE — Assessment & Plan Note (Signed)
No longer monitored per cardiology notes from November 2021 as daughter and patient would not want repair if warranted.

## 2021-09-09 NOTE — Assessment & Plan Note (Signed)
Compliant to alendronate 70 mg weekly. Continue same. Bone density scan due and is pending.

## 2021-09-10 ENCOUNTER — Telehealth: Payer: Self-pay | Admitting: Primary Care

## 2021-09-10 DIAGNOSIS — F03A0 Unspecified dementia, mild, without behavioral disturbance, psychotic disturbance, mood disturbance, and anxiety: Secondary | ICD-10-CM

## 2021-09-10 LAB — LIPID PANEL
Cholesterol: 158 mg/dL (ref 0–200)
HDL: 45.4 mg/dL (ref >39.00)
LDL Cholesterol: 83 mg/dL (ref 0–99)
NonHDL: 112.43
Total CHOL/HDL Ratio: 3
Triglycerides: 146 mg/dL (ref 0.0–149.0)
VLDL: 29.2 mg/dL (ref 0.0–40.0)

## 2021-09-10 LAB — CBC
HCT: 37.9 % (ref 36.0–46.0)
Hemoglobin: 12.5 g/dL (ref 12.0–15.0)
MCHC: 32.9 g/dL (ref 30.0–36.0)
MCV: 91.5 fl (ref 78.0–100.0)
Platelets: 204 10*3/uL (ref 150.0–400.0)
RBC: 4.15 Mil/uL (ref 3.87–5.11)
RDW: 13.9 % (ref 11.5–15.5)
WBC: 4.9 10*3/uL (ref 4.0–10.5)

## 2021-09-10 LAB — COMPREHENSIVE METABOLIC PANEL
ALT: 14 U/L (ref 0–35)
AST: 17 U/L (ref 0–37)
Albumin: 3.9 g/dL (ref 3.5–5.2)
Alkaline Phosphatase: 132 U/L — ABNORMAL HIGH (ref 39–117)
BUN: 14 mg/dL (ref 6–23)
CO2: 29 mEq/L (ref 19–32)
Calcium: 8.6 mg/dL (ref 8.4–10.5)
Chloride: 102 mEq/L (ref 96–112)
Creatinine, Ser: 1.14 mg/dL (ref 0.40–1.20)
GFR: 44.04 mL/min — ABNORMAL LOW (ref >60.00)
Glucose, Bld: 76 mg/dL (ref 70–99)
Potassium: 4.9 mEq/L (ref 3.5–5.1)
Sodium: 137 mEq/L (ref 135–145)
Total Bilirubin: 0.7 mg/dL (ref 0.2–1.2)
Total Protein: 6.1 g/dL (ref 6.0–8.3)

## 2021-09-10 LAB — TSH: TSH: 1.6 u[IU]/mL (ref 0.35–5.50)

## 2021-09-10 MED ORDER — MEMANTINE HCL ER 28 MG PO CP24: 28.0000 mg | ORAL_CAPSULE | Freq: Every day | ORAL | 3 refills | Status: DC

## 2021-09-10 MED ORDER — DONEPEZIL HCL 10 MG PO TABS: 10.0000 mg | ORAL_TABLET | Freq: Every day | ORAL | 3 refills | Status: DC

## 2021-09-10 NOTE — Telephone Encounter (Signed)
Yes, okay to separate.  If they have the memantine then I'll send in donepezil.  Please tell pharmacy to discontinue Namzaric Rx. How many refills are left on memantine?

## 2021-09-10 NOTE — Telephone Encounter (Signed)
Spoke to pharmacist  [facility]  Had one script for #30 that was sent out 11/6 she does not have any refills  Donepexil  Last refill was sent on 8/30 for #30 patient still has 60 tabs from script called in will send out 30 in blister pack today. New script not needed at this time but will need in 60 days.   Will d/c Namzaric now.

## 2021-09-10 NOTE — Telephone Encounter (Signed)
Pharmacy called stating that the namazaric Is on back order. Pharmacy wants to know if the provider wants pt to take medication separate or wait until back order comes in. Pt already has memantine but donepezil has not been sent  out since August

## 2021-09-10 NOTE — Telephone Encounter (Signed)
Refill(s) for memantine and donepezil sent to pharmacy.

## 2021-09-28 ENCOUNTER — Other Ambulatory Visit: Payer: Self-pay | Admitting: Internal Medicine

## 2021-09-28 DIAGNOSIS — I2510 Atherosclerotic heart disease of native coronary artery without angina pectoris: Secondary | ICD-10-CM

## 2021-09-29 NOTE — Telephone Encounter (Signed)
  Notes to clinic:  Not a pt in this practice, please assess.  Requested Prescriptions  Pending Prescriptions Disp Refills   ASPIRIN LOW DOSE 81 MG EC tablet [Pharmacy Med Name: ASPIRIN LOW DOSE 81 MG DR TAB] 180 tablet 3    Sig: TAKE 2 TABLETS (162 MG TOTAL) BY MOUTH DAILY     Analgesics:  NSAIDS - aspirin Failed - 09/28/2021  5:10 PM      Failed - Valid encounter within last 12 months    Recent Outpatient Visits   None            Passed - Patient is not pregnant

## 2021-10-05 ENCOUNTER — Encounter: Payer: Self-pay | Admitting: Cardiovascular Disease

## 2021-10-05 ENCOUNTER — Ambulatory Visit (INDEPENDENT_AMBULATORY_CARE_PROVIDER_SITE_OTHER): Payer: Medicare Other | Admitting: Cardiovascular Disease

## 2021-10-05 ENCOUNTER — Other Ambulatory Visit: Payer: Self-pay

## 2021-10-05 VITALS — BP 104/54 | HR 82 | Ht 67.5 in | Wt 171.0 lb

## 2021-10-05 DIAGNOSIS — Z953 Presence of xenogenic heart valve: Secondary | ICD-10-CM

## 2021-10-05 DIAGNOSIS — E7800 Pure hypercholesterolemia, unspecified: Secondary | ICD-10-CM

## 2021-10-05 DIAGNOSIS — I4719 Other supraventricular tachycardia: Secondary | ICD-10-CM

## 2021-10-05 DIAGNOSIS — I471 Supraventricular tachycardia: Secondary | ICD-10-CM

## 2021-10-05 DIAGNOSIS — Z950 Presence of cardiac pacemaker: Secondary | ICD-10-CM | POA: Diagnosis not present

## 2021-10-05 DIAGNOSIS — G301 Alzheimer's disease with late onset: Secondary | ICD-10-CM | POA: Diagnosis not present

## 2021-10-05 DIAGNOSIS — I739 Peripheral vascular disease, unspecified: Secondary | ICD-10-CM

## 2021-10-05 DIAGNOSIS — I5032 Chronic diastolic (congestive) heart failure: Secondary | ICD-10-CM

## 2021-10-05 DIAGNOSIS — I10 Essential (primary) hypertension: Secondary | ICD-10-CM | POA: Diagnosis not present

## 2021-10-05 DIAGNOSIS — I7143 Infrarenal abdominal aortic aneurysm, without rupture: Secondary | ICD-10-CM

## 2021-10-05 DIAGNOSIS — I341 Nonrheumatic mitral (valve) prolapse: Secondary | ICD-10-CM

## 2021-10-05 DIAGNOSIS — F0280 Dementia in other diseases classified elsewhere without behavioral disturbance: Secondary | ICD-10-CM

## 2021-10-05 DIAGNOSIS — I2510 Atherosclerotic heart disease of native coronary artery without angina pectoris: Secondary | ICD-10-CM | POA: Diagnosis not present

## 2021-10-05 MED ORDER — POTASSIUM CHLORIDE ER 10 MEQ PO TBCR: 20.0000 meq | EXTENDED_RELEASE_TABLET | Freq: Two times a day (BID) | ORAL | 11 refills | Status: DC

## 2021-10-05 MED ORDER — ASPIRIN 81 MG PO TBEC: 81.0000 mg | DELAYED_RELEASE_TABLET | Freq: Every day | ORAL | 2 refills | Status: DC

## 2021-10-05 MED ORDER — FUROSEMIDE 40 MG PO TABS: 40.0000 mg | ORAL_TABLET | Freq: Every day | ORAL | 3 refills | Status: DC

## 2021-10-05 NOTE — Patient Instructions (Signed)
Medication Instructions:  DECREASE the Furosemide to 40 mg once daily DECREASE the Potassium to 20 mEq (two of the 10 mEq tablets) twice daily DECREASE the Aspirin to 81 mg once daily  *If you need a refill on your cardiac medications before your next appointment, please call your pharmacy*   Lab Work: Your provider would like for you to return in 6 weeks to have the following labs drawn: BMET and BNP. You do not need an appointment for the lab. Once in our office lobby there is a podium where you can sign in and ring the doorbell to alert [place] that you are here. The lab is open from 8:00 am to 4:30 pm; closed for lunch from 12:45pm-1:45pm.  If you have labs (blood work) drawn today and your tests are completely normal, you will receive your results only by: MyChart Message (if you have MyChart) OR A paper copy in the mail If you have any lab test that is abnormal or we need to change your treatment, we will call you to review the results.   Testing/Procedures: None ordered   Follow-Up: At [facility], you and your health needs are our priority.  As part of our continuing mission to provide you with exceptional heart care, we have created designated Provider Care Teams.  These Care Teams include your primary Cardiologist (physician) and Advanced Practice Providers (APPs -  Physician Assistants and Nurse Practitioners) who all work together to provide you with the care you need, when you need it.  We recommend signing up for the patient portal called "MyChart".  Sign up information is provided on this After Visit Summary.  MyChart is used to connect with patients for Virtual Visits (Telemedicine).  Patients are able to view lab/test results, encounter notes, upcoming appointments, etc.  Non-urgent messages can be sent to your provider as well.   To learn more about what you can do with MyChart, go to [email or web address].    Your next appointment:   12 month(s)  The format for your  next appointment:   In Person  Provider:   [name], MD

## 2021-10-05 NOTE — Progress Notes (Signed)
Cardiology office note     Date:  10/05/2021   ID:  [name], DOB 01/18/1934, MRN [id number]  PCP:  Doreene Nest, NP  Cardiologist:  [name], MD  Electrophysiologist:  None   Evaluation Performed:  Follow-Up Visit diastolic heart failure, s/p AVR and pacemaker  Chief Complaint: CHF  History of Present Illness:     As with previous visits the patient's daughter, [name], participated in the visit, due to the patient's short-term memory issues.  [name] is a 87 y.o. female with chronic heart failure with preserved left ventricular ejection fraction, history of aortic valve replacement with a biological prosthesis, mild mitral valve prolapse with mild mitral insufficiency, remote history of syncope due to Mobitz type II second-degree AV block with implantation of a dual-chamber permanent pacemaker (Medtronic, 2016), small AAA.  She is doing well.  Clearly her memory difficulties are worsening but she enjoys assisted living.  She has not had any falls or injuries or bleeding problems.  She has no cardiovascular complaints.  Her weight is up about 5 pounds from last year, but there are no signs of hypervolemia or symptoms of shortness of breath or edema.  Presenting rhythm is normal sinus.  Interrogation of her pacemaker (Medtronic Adapta dual-chamber, implanted 2016, estimated longevity 10 years) shows normal device function.  She has 14% atrial pacing and virtually no ventricular pacing.  There has been no true atrial fibrillation although she has numerous episodes of paroxysmal atrial tachycardia.  Most of these are less than 5 seconds in duration.  The longest was only 41 seconds.  The aortic valve is a 21 mm Edwards MagnaEase bovine bioprosthesis placed in 2009. Her echocardiogram was last performed in July 2020 and shows normal left ventricular systolic function and normal gradients across the aortic valve biological prosthesis (Mean gradient 12 mmHg,  dimensionless index 0.47).  Her dual-chamber Medtronic Adapta permanent pacemaker was implanted in 2016 for sinus pauses and second-degree AV block.  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     Past Medical History:  Diagnosis Date   AAA (abdominal aortic aneurysm) 01/03/13   3.5x3.3cm   Alzheimer's dementia (HCC)    "probably middle stage" (11/05/2014)   Anemia    "hx of chronic" (11/05/2014)   Aortic stenosis 03/29/2008   biological prosthetic replacement   Arthritis    "joints" (11/05/2014)   Asthma    Avascular necrosis of bone of hip, right (HCC) 07/28/2018   Chronic asthmatic bronchitis (HCC)    "q fall and winter" (11/05/2014)   Closed nondisplaced fracture of middle phalanx of lesser toe of right foot 01/05/2021   Coronary artery disease    CABG 03/29/08   Dyslipidemia    Exertional shortness of breath    Family history of adverse reaction to anesthesia    "daughter gets PONV too"   Heart murmur    Hypertension    Hypothyroidism    LBBB (left bundle branch block)    Migraines    "none in years' (11/05/2014)   Mobitz type 2 second degree AV block 10/31/2014   MVP (mitral valve prolapse)    with mild mitral insufficiency/notes 08/17/2013   Near syncope 08/17/2013   PONV (postoperative nausea and vomiting)    Presence of permanent cardiac pacemaker    Sinus pause 10/31/2014   Stroke (HCC) 03/2008   "2 light strokes"; denies residual on 11/05/2014   Urinary frequency    Past Surgical History:  Procedure  Laterality Date   ABDOMINAL HYSTERECTOMY  1977   AORTIC VALVE REPLACEMENT  03/29/2008   PERICARDIAL TISSUE VALVE   APPENDECTOMY  ?1977   CARDIAC CATHETERIZATION  ~ 2009   CATARACT EXTRACTION W/ INTRAOCULAR LENS IMPLANT Left    CORONARY ARTERY BYPASS GRAFT  03/29/2008   LIMA TO LAD,SVG TO CX,SVG TO LEFT POSTEROLATERAL BRANCH   HIP FRACTURE SURGERY Right 06/2010   "3 metal screws"    INSERT / REPLACE / REMOVE PACEMAKER  11/05/2014   LOOP RECORDER  EXPLANT N/A 11/05/2014   Procedure: LOOP RECORDER EXPLANT;  Surgeon: Sanda Klein, MD;  Location: [place] CATH LAB;  Service: Cardiovascular;  Laterality: N/A;   LOOP RECORDER IMPLANT N/A 09/17/2013   Procedure: LOOP RECORDER IMPLANT;  Surgeon: Sanda Klein, MD;  Location: [place] CATH LAB;  Service: Cardiovascular;  Laterality: N/A;   PERMANENT PACEMAKER INSERTION N/A 11/05/2014   Procedure: PERMANENT PACEMAKER INSERTION;  Surgeon: Sanda Klein, MD;  Location: [place] CATH LAB;  Service: Cardiovascular;  Laterality: N/A;   REFRACTIVE SURGERY Bilateral    /notes 04/28/2008 (08/17/2013)   RETINAL DETACHMENT SURGERY Left 1994   Archie Endo 04/10/2008 (08/17/2013)   TIBIAL TUBERCLERPLASTY  11/05/2014   tooth pulled   06/2018   TOTAL HIP ARTHROPLASTY Right 07/31/2018   Procedure: RIGHT TOTAL HIP ARTHROPLASTY ANTERIOR APPROACH;  Surgeon: Frederik Pear, MD;  Location: [facility];  Service: Orthopedics;  Laterality: Right;   UTERINE FIBROID SURGERY  1960's     No outpatient medications have been marked as taking for the 10/05/21 encounter (Office Visit) with Nichol Ator, Dani Gobble, MD.     Allergies:   Codeine and Morphine and related   Social History   Tobacco Use   Smoking status: Never   Smokeless tobacco: Never  Vaping Use   Vaping Use: Never used  Substance Use Topics   Alcohol use: No    Alcohol/week: 0.0 standard drinks   Drug use: No     Family Hx: The patient's family history includes Diabetes in her mother; Heart failure in her mother; Hyperlipidemia in her mother; Hypertension in her mother; Melanoma in her grandchild. There is no history of Colon cancer.  ROS:   Please see the history of present illness.   All other systems are reviewed and are negative.  Prior CV studies:   The following studies were reviewed today: Comprehensive pacemaker check in the office  Echo July 2020  1. The left ventricle has normal systolic function with an ejection  fraction of 60-65%. The cavity size was normal. Left  ventricular diastolic  Doppler parameters are consistent with pseudonormalization. Elevated mean  left atrial pressure No evidence of  left ventricular regional wall motion abnormalities.   2. The right ventricle has normal systolic function. The cavity was  normal. There is no increase in right ventricular wall thickness. Right  ventricular systolic pressure is normal with an estimated pressure of 21.7  mmHg.   3. Left atrial size was moderately dilated.   4. A pericardial bioprosthesis valve is present in the aortic position.  Procedure Date: 03/29/2008.   5. When compared to the prior study: 01/26/2017, left ventricular  systolic function and aortic valve gradients are in normal range and  stable. There continues to be evidence of increased left atrial pressure.   Labs/Other Tests and Data Reviewed:    EKG:   ECG ordered today and personally viewed shows sinus rhythm with single PAC, bifascicular block (RBBB plus LAFB), no ischemic report changes, QTC 507 ms Recent Labs: 09/09/2021: ALT  14; BUN 14; Creatinine, Ser 1.14; Hemoglobin 12.5; Platelets 204.0; Potassium 4.9; Sodium 137; TSH 1.60   Recent Lipid Panel Lab Results  Component Value Date/Time   CHOL 158 09/09/2021 03:01 PM   TRIG 146.0 09/09/2021 03:01 PM   HDL 45.40 09/09/2021 03:01 PM   CHOLHDL 3 09/09/2021 03:01 PM   LDLCALC 83 09/09/2021 03:01 PM   LDLCALC 85 07/04/2020 03:28 PM    Wt Readings from Last 3 Encounters:  10/05/21 171 lb (77.6 kg)  09/09/21 174 lb (78.9 kg)  01/26/21 176 lb 4 oz (79.9 kg)     Objective:    Vital Signs:  BP (!) 104/54 (BP Location: Left Arm, Patient Position: Sitting, Cuff Size: Normal)   Pulse 82   Ht 5' 7.5" (1.715 m)   Wt 171 lb (77.6 kg)   BMI 26.39 kg/m    General: Alert, oriented x3, no distress, healthy left subclavian pacemaker site Head: no evidence of trauma, PERRL, EOMI, no exophtalmos or lid lag, no myxedema, no xanthelasma; normal ears, nose and oropharynx Neck:  normal jugular venous pulsations and no hepatojugular reflux; brisk carotid pulses without delay and no carotid bruits Chest: clear to auscultation, no signs of consolidation by percussion or palpation, normal fremitus, symmetrical and full respiratory excursions Cardiovascular: normal position and quality of the apical impulse, regular rhythm, normal first and second heart sounds, barely audible early peaking systolic ejection murmur in the aortic focus, no diastolic murmurs, rubs or gallops Abdomen: no tenderness or distention, no masses by palpation, no abnormal pulsatility or arterial bruits, normal bowel sounds, no hepatosplenomegaly Extremities: no clubbing, cyanosis or edema; 2+ radial, ulnar and brachial pulses bilaterally; 2+ right femoral, posterior tibial and dorsalis pedis pulses; 2+ left femoral, posterior tibial and dorsalis pedis pulses; no subclavian or femoral bruits Neurological: grossly nonfocal Psych: Normal mood and affect    ASSESSMENT & PLAN:    1. Chronic diastolic heart failure ([place])   2. Coronary artery disease involving native coronary artery of native heart without angina pectoris   3. S/P aortic valve replacement with bioprosthetic valve   4. MVP (mitral valve prolapse) with mild mitral insufficiency   5. PAT (paroxysmal atrial tachycardia) ([place])   6. Pacemaker   7. Infrarenal abdominal aortic aneurysm (AAA) without rupture   8. PAD (peripheral artery disease) ([place])   9. Hypercholesterolemia   10. Essential hypertension   11. Late onset Alzheimer's dementia without behavioral disturbance ([place])       CHF: She is slowly gaining additional weight.  Appears euvolemic.  NYHA functional class I.  Blood pressure is relatively low.  Decrease furosemide to 40 mg once daily and decrease potassium.  Recheck metabolic panel and BNP in a few weeks.  Reset her dry weight at 170 pounds. Hx AVR: Normal bioprosthetic valve function clinically and by most recent echo in July  2020.  Recheck echo next year. CAD s/p CABG: Asymptomatic MVP/MR: Not audible on physical exam today.  Recheck echo next year. PAT/AFib: When she was still living at home she had numerous falls and we stopped her anticoagulation.  Now that she is in assisted living, she has not had a fall in about 2 years, but she is also had an extremely low burden of arrhythmia.  In fact, there is no evidence of atrial fibrillation in the last year, and the longest episode of atrial fibrillation in the last 2 years was only 4 minutes long.  Clearly at risk for recurrent atrial fibrillation, since she continues have  frequent episodes of brief atrial tachycardia.  She has never had a stroke or TIA.  We will keep her off anticoagulation for the time being, with the option to restart anticoagulants if long episodes of atrial fibrillation are detected.   PM: Continue remote downloads every 3 months. AAA: Asymptomatic.  This was relatively small at 3.7 cm and has not been reevaluated since September 2018.  We stopped doing periodic evaluation since both the patient and her daughter have expressed no interest in surgical or device repair. PAD: She has some stenosis of the mid aorta and 50% bilateral common iliac artery stenosis.  Denies intermittent claudication. HLP: HDL cholesterol remains above target, but only slightly so.  The dose of atorvastatin was limited by her issues with worsening cognitive status.  We will continue same medication. HTN: She has had problems with orthostatic hypotension in the past.  Thankfully right now not symptomatic, but with diastolic blood pressure in the low 50s.  Reduce furosemide dose. Alzheimer's dementia: Worsening short-term memory was patently evident during the exam today and she gets easily confused.  On cholinesterase inhibitors.  Due to progression of cognitive deficits, trying to avoid aggressive/invasive procedures.  Patient Instructions  Medication Instructions:  DECREASE the  Furosemide to 40 mg once daily DECREASE the Potassium to 20 mEq (two of the 10 mEq tablets) twice daily DECREASE the Aspirin to 81 mg once daily  *If you need a refill on your cardiac medications before your next appointment, please call your pharmacy*   Lab Work: Your provider would like for you to return in 6 weeks to have the following labs drawn: BMET and BNP. You do not need an appointment for the lab. Once in our office lobby there is a podium where you can sign in and ring the doorbell to alert [place] that you are here. The lab is open from 8:00 am to 4:30 pm; closed for lunch from 12:45pm-1:45pm.  If you have labs (blood work) drawn today and your tests are completely normal, you will receive your results only by: MyChart Message (if you have MyChart) OR A paper copy in the mail If you have any lab test that is abnormal or we need to change your treatment, we will call you to review the results.   Testing/Procedures: None ordered   Follow-Up: At [facility], you and your health needs are our priority.  As part of our continuing mission to provide you with exceptional heart care, we have created designated Provider Care Teams.  These Care Teams include your primary Cardiologist (physician) and Advanced Practice Providers (APPs -  Physician Assistants and Nurse Practitioners) who all work together to provide you with the care you need, when you need it.  We recommend signing up for the patient portal called "MyChart".  Sign up information is provided on this After Visit Summary.  MyChart is used to connect with patients for Virtual Visits (Telemedicine).  Patients are able to view lab/test results, encounter notes, upcoming appointments, etc.  Non-urgent messages can be sent to your provider as well.   To learn more about what you can do with MyChart, go to [email or web address].    Your next appointment:   12 month(s)  The format for your next appointment:   In  Person  Provider:   [name], MD     Signed, [name], MD  10/05/2021 5:24 PM    [facility]

## 2021-10-12 ENCOUNTER — Other Ambulatory Visit: Payer: Self-pay | Admitting: Primary Care

## 2021-10-12 DIAGNOSIS — J4530 Mild persistent asthma, uncomplicated: Secondary | ICD-10-CM

## 2021-10-13 NOTE — Telephone Encounter (Signed)
Left message to return call to our office.  

## 2021-10-13 NOTE — Telephone Encounter (Signed)
We need to clarify which Advair dose she is using. Please call pharmacy. These don't match.

## 2021-10-16 NOTE — Telephone Encounter (Signed)
Called number x 2 only got busy signal power may be out due to weather.

## 2021-10-20 ENCOUNTER — Other Ambulatory Visit: Payer: Self-pay | Admitting: Primary Care

## 2021-10-20 DIAGNOSIS — M8580 Other specified disorders of bone density and structure, unspecified site: Secondary | ICD-10-CM

## 2021-10-20 NOTE — Telephone Encounter (Signed)
Left message to return call to our office.  

## 2021-10-20 NOTE — Telephone Encounter (Signed)
Please call patient's daughter:  Patient is overdue for a bone density scan and will need this before we can continue refilling her bone density medication, alendronate. The bone density orders were placed during her visit in November 2022.  Have her daughter schedule.

## 2021-10-21 NOTE — Telephone Encounter (Signed)
Left message for [name] (daughter) asking her to please help her mom schedule her bone density test at [facility].  Phone number for [place] provided on voicemail.  I ask that she call me back if she has any questions.

## 2021-10-21 NOTE — Telephone Encounter (Signed)
Spoke with [facility].  They have on file the Advair 250-50 MCG/Inhaler.  Last Rx was written on 01/02/2021 for 1 inhaler with 5 refills by K. Clark.  They have no record of the 45-21 MGC/ACT on file.

## 2021-10-22 NOTE — Telephone Encounter (Signed)
Spoke with [name].  She thought someone would be calling them to schedule her mom's bone density so that is why it hasn't been scheduled.  She will call [facility] to set up that appointment.  FYI to [place].

## 2021-10-23 ENCOUNTER — Telehealth: Payer: Self-pay | Admitting: Primary Care

## 2021-10-23 NOTE — Telephone Encounter (Signed)
Called and let daughter know I have faxed orders

## 2021-10-23 NOTE — Telephone Encounter (Signed)
Pt daughter called stating that she called [facility] in [place] for a Bone Density for pt. Pt daughter stated that they stated that they would need a referral from [facility]. Please advise.

## 2021-11-14 ENCOUNTER — Emergency Department: Payer: Medicare Other

## 2021-11-14 ENCOUNTER — Other Ambulatory Visit: Payer: Self-pay

## 2021-11-14 ENCOUNTER — Emergency Department
Admission: EM | Admit: 2021-11-14 | Discharge: 2021-11-15 | Disposition: A | Discharge status: Home / Self Care | Payer: Medicare Other | Attending: Emergency Medicine | Admitting: Emergency Medicine

## 2021-11-14 DIAGNOSIS — W19XXXA Unspecified fall, initial encounter: Secondary | ICD-10-CM

## 2021-11-14 DIAGNOSIS — Z471 Aftercare following joint replacement surgery: Secondary | ICD-10-CM | POA: Diagnosis not present

## 2021-11-14 DIAGNOSIS — S2241XA Multiple fractures of ribs, right side, initial encounter for closed fracture: Secondary | ICD-10-CM | POA: Diagnosis not present

## 2021-11-14 DIAGNOSIS — W1812XA Fall from or off toilet with subsequent striking against object, initial encounter: Secondary | ICD-10-CM | POA: Diagnosis not present

## 2021-11-14 DIAGNOSIS — R109 Unspecified abdominal pain: Secondary | ICD-10-CM

## 2021-11-14 DIAGNOSIS — K449 Diaphragmatic hernia without obstruction or gangrene: Secondary | ICD-10-CM | POA: Diagnosis not present

## 2021-11-14 DIAGNOSIS — Z96641 Presence of right artificial hip joint: Secondary | ICD-10-CM | POA: Diagnosis not present

## 2021-11-14 DIAGNOSIS — I714 Abdominal aortic aneurysm, without rupture, unspecified: Secondary | ICD-10-CM | POA: Diagnosis not present

## 2021-11-14 DIAGNOSIS — S2231XA Fracture of one rib, right side, initial encounter for closed fracture: Secondary | ICD-10-CM | POA: Diagnosis not present

## 2021-11-14 DIAGNOSIS — M25551 Pain in right hip: Secondary | ICD-10-CM | POA: Diagnosis not present

## 2021-11-14 DIAGNOSIS — S199XXA Unspecified injury of neck, initial encounter: Secondary | ICD-10-CM | POA: Diagnosis not present

## 2021-11-14 DIAGNOSIS — R519 Headache, unspecified: Secondary | ICD-10-CM | POA: Diagnosis not present

## 2021-11-14 DIAGNOSIS — S0990XA Unspecified injury of head, initial encounter: Secondary | ICD-10-CM | POA: Diagnosis not present

## 2021-11-14 DIAGNOSIS — Z951 Presence of aortocoronary bypass graft: Secondary | ICD-10-CM | POA: Diagnosis not present

## 2021-11-14 DIAGNOSIS — M4316 Spondylolisthesis, lumbar region: Secondary | ICD-10-CM | POA: Diagnosis not present

## 2021-11-14 DIAGNOSIS — R071 Chest pain on breathing: Secondary | ICD-10-CM | POA: Diagnosis present

## 2021-11-14 DIAGNOSIS — M5136 Other intervertebral disc degeneration, lumbar region: Secondary | ICD-10-CM | POA: Diagnosis not present

## 2021-11-14 DIAGNOSIS — S3991XA Unspecified injury of abdomen, initial encounter: Secondary | ICD-10-CM | POA: Diagnosis not present

## 2021-11-14 DIAGNOSIS — I517 Cardiomegaly: Secondary | ICD-10-CM | POA: Diagnosis not present

## 2021-11-14 LAB — CBC WITH DIFFERENTIAL/PLATELET
Abs Immature Granulocytes: 0.01 10*3/uL (ref 0.00–0.07)
Basophils Absolute: 0 10*3/uL (ref 0.0–0.1)
Basophils Relative: 1 %
Eosinophils Absolute: 0 10*3/uL (ref 0.0–0.5)
Eosinophils Relative: 1 %
HCT: 38.7 % (ref 36.0–46.0)
Hemoglobin: 13 g/dL (ref 12.0–15.0)
Immature Granulocytes: 0 %
Lymphocytes Relative: 16 %
Lymphs Abs: 0.9 10*3/uL (ref 0.7–4.0)
MCH: 30.4 pg (ref 26.0–34.0)
MCHC: 33.6 g/dL (ref 30.0–36.0)
MCV: 90.6 fL (ref 80.0–100.0)
Monocytes Absolute: 0.5 10*3/uL (ref 0.1–1.0)
Monocytes Relative: 9 %
Neutro Abs: 4 10*3/uL (ref 1.7–7.7)
Neutrophils Relative %: 73 %
Platelets: 189 10*3/uL (ref 150–400)
RBC: 4.27 MIL/uL (ref 3.87–5.11)
RDW: 13.4 % (ref 11.5–15.5)
WBC: 5.5 10*3/uL (ref 4.0–10.5)
nRBC: 0 % (ref 0.0–0.2)

## 2021-11-14 LAB — URINALYSIS, ROUTINE W REFLEX MICROSCOPIC
Bacteria, UA: NONE SEEN
Bilirubin Urine: NEGATIVE
Glucose, UA: NEGATIVE mg/dL
Hgb urine dipstick: NEGATIVE
Ketones, ur: NEGATIVE mg/dL
Nitrite: NEGATIVE
Protein, ur: NEGATIVE mg/dL
Specific Gravity, Urine: 1.012 (ref 1.005–1.030)
pH: 7 (ref 5.0–8.0)

## 2021-11-14 LAB — BASIC METABOLIC PANEL
Anion gap: 9 (ref 5–15)
BUN: 11 mg/dL (ref 8–23)
CO2: 27 mmol/L (ref 22–32)
Calcium: 8.5 mg/dL — ABNORMAL LOW (ref 8.9–10.3)
Chloride: 103 mmol/L (ref 98–111)
Creatinine, Ser: 1.1 mg/dL — ABNORMAL HIGH (ref 0.44–1.00)
GFR, Estimated: 49 mL/min — ABNORMAL LOW (ref >60)
Glucose, Bld: 134 mg/dL — ABNORMAL HIGH (ref 70–99)
Potassium: 3.4 mmol/L — ABNORMAL LOW (ref 3.5–5.1)
Sodium: 139 mmol/L (ref 135–145)

## 2021-11-14 MED ORDER — OXYCODONE HCL 5 MG PO TABS: 2.5000 mg | ORAL_TABLET | Freq: Once | ORAL | Status: DC

## 2021-11-14 MED ORDER — LIDOCAINE 5 % EX PTCH: 1.0000 | MEDICATED_PATCH | CUTANEOUS | Status: DC

## 2021-11-14 MED ORDER — ONDANSETRON HCL 4 MG/2ML IJ SOLN
4.0000 mg | Freq: Once | INTRAMUSCULAR | Status: AC
  Administered 2021-11-14: 4 mg via INTRAVENOUS
  Filled 2021-11-14: qty 2

## 2021-11-14 MED ORDER — ACETAMINOPHEN 500 MG PO TABS
1000.0000 mg | ORAL_TABLET | Freq: Once | ORAL | Status: AC
Start: 2021-11-14 — End: 2021-11-14
  Administered 2021-11-14: 1000 mg via ORAL
  Filled 2021-11-14: qty 2

## 2021-11-14 MED ORDER — IOHEXOL 300 MG/ML  SOLN
100.0000 mL | Freq: Once | INTRAMUSCULAR | Status: AC | PRN
  Administered 2021-11-14: 100 mL via INTRAVENOUS

## 2021-11-14 MED ORDER — KETOROLAC TROMETHAMINE 30 MG/ML IJ SOLN
15.0000 mg | Freq: Once | INTRAMUSCULAR | Status: AC
  Administered 2021-11-14: 15 mg via INTRAVENOUS
  Filled 2021-11-14: qty 1

## 2021-11-14 MED ORDER — TRAMADOL HCL 50 MG PO TABS
50.0000 mg | ORAL_TABLET | Freq: Once | ORAL | Status: AC
  Administered 2021-11-14: 50 mg via ORAL
  Filled 2021-11-14: qty 1

## 2021-11-14 NOTE — ED Provider Triage Note (Signed)
Emergency Medicine Provider Triage Evaluation Note  [name] , a 88 y.o. female  was evaluated in triage.  Pt complains of possible fall yesterday.  Daughter is with the patient, patient has a history of dementia and is a poor historian.  Possible fall while using the restroom yesterday.  Complaining of right rib, right back, right hip pain.  History of right hip replacement.  Patient is at her baseline according to the daughter.  No recent fevers or chills..  Review of Systems  Positive: Possible fall yesterday, right rib, right back, right hip pain Negative: Changes from baseline, fevers or chills, complaints of chest pain, shortness of breath, emesis, diarrhea, urinary changes  Physical Exam  BP (!) 190/87 (BP Location: Left Arm)    Pulse 76    Temp 99.1 F (37.3 C) (Oral)    Resp 20    Ht 5\' 9"  (1.753 m)    Wt 79.4 kg    SpO2 96%    BMI 25.84 kg/m  Gen:   Awake, no distress   Resp:  Normal effort  MSK:   Moves extremities without difficulty.  No obvious deformity.  No shortening or rotation of the right lower extremity.  Slight tenderness in the right lumbar spine with palpation.  No palpable abnormality. Other:    Medical Decision Making  Medically screening exam initiated at 4:11 PM.  Appropriate orders placed.  [name] was informed that the remainder of the evaluation will be completed by another provider, this initial triage assessment does not replace that evaluation, and the importance of remaining in the ED until their evaluation is complete.  Patient with history of dementia, possible fall yesterday according to the daughter.  Patient is complaining of rib, back, hip pain on the right side.  Patient will have CT scan of the head, neck, x-rays of the ribs, lumbar spine, hip.  Urinalysis.   Wynonia Sours, PA-C 11/14/21 1611

## 2021-11-14 NOTE — ED Triage Notes (Signed)
Pt with daughter and daughter states that she went to go check on pt and pt was still asleep at 1 pm- pt told daughter that she had a fall sometime yesterday trying to use the bathroom- pt c/o only of R lower back pain

## 2021-11-14 NOTE — ED Provider Notes (Signed)
[facility] Provider Note    Event Date/Time   First MD Initiated Contact with Patient 11/14/21 2108     (approximate)   History   Back Pain and Fall   HPI  [name] is a 88 y.o. female here with fall.  The patient believes that she fell about 2 days ago.  She states that she was trying to get up off the toilet when she fell, hitting her right side.  She has since had right side and some back pain.  The pain is severe, worse with any kind of movement.  She has a reportedly high pain tolerance per her daughter, who states that is very abnormal for her to complain of pain.  She had previous hip surgery without taking any analgesics.  She states the pain is worse with certain movements as well as deep inspiration.  Occasionally radiates down her hip.  No numbness or weakness.  No abdominal pain.  She is not on blood thinners.     Physical Exam   Triage Vital Signs: ED Triage Vitals  Enc Vitals Group     BP 11/14/21 1607 (!) 190/87     Pulse Rate 11/14/21 1607 76     Resp 11/14/21 1607 20     Temp 11/14/21 1607 99.1 F (37.3 C)     Temp Source 11/14/21 1607 Oral     SpO2 11/14/21 1607 96 %     Weight 11/14/21 1609 175 lb (79.4 kg)     Height 11/14/21 1609 5\' 9"  (1.753 m)     Head Circumference --      Peak Flow --      Pain Score 11/14/21 1609 5     Pain Loc --      Pain Edu? --      Excl. in GC? --     Most recent vital signs: Vitals:   11/14/21 1607 11/14/21 2014  BP: (!) 190/87 (!) 176/79  Pulse: 76 72  Resp: 20 20  Temp: 99.1 F (37.3 C)   SpO2: 96% 98%     General: Awake, no distress.  CV:  Good peripheral perfusion.  No murmurs or rubs. Resp:  Normal effort.  No wheezes or rales.  No rhonchi. Abd:  No distention.  No tenderness.  Specifically, no right upper quadrant tenderness.  No bruising Other:  Tenderness to palpation over the right posterior lower ribs, with tenderness upon any movement and wincing.  Mild tenderness  over the right lower lumbar paraspinal muscles.  Strength out of 5 bilateral lower extremities.  Normal sensation light touch.   ED Results / Procedures / Treatments   Labs (all labs ordered are listed, but only abnormal results are displayed) Labs Reviewed  CBC WITH DIFFERENTIAL/PLATELET  URINALYSIS, ROUTINE W REFLEX MICROSCOPIC  BASIC METABOLIC PANEL     EKG    RADIOLOGY CT head/C-spine: Negative Chest x-ray: Negative DG lumbar spine: Scoliosis, aneurysmal dilatation, no acute abnormality DG hip right: Negative, hold hip replacement   I also independently reviewed and agree wit radiologist interpretations.   PROCEDURES:  Critical Care performed: No   MEDICATIONS ORDERED IN ED: Medications  lidocaine (LIDODERM) 5 % 1 patch (1 patch Transdermal Not Given 11/14/21 2210)  acetaminophen (TYLENOL) tablet 1,000 mg (1,000 mg Oral Given 11/14/21 2209)  ondansetron (ZOFRAN) injection 4 mg (4 mg Intravenous Given 11/14/21 2209)  ketorolac (TORADOL) 30 MG/ML injection 15 mg (15 mg Intravenous Given 11/14/21 2210)  traMADol (ULTRAM) tablet 50  mg (50 mg Oral Given 11/14/21 2210)     IMPRESSION / MDM / ASSESSMENT AND PLAN / ED COURSE  I reviewed the triage vital signs and the nursing notes.                               MDM:  88 year old female here with right flank pain after fall.  Patient reportedly has a very high pain tolerance, and daughter is concerned given the degree of her discomfort earlier today.  Patient is in no apparent respiratory distress but does have significant pain upon her right chest wall.  Initial plain films and CTs obtained in triage reviewed.  CT head and C-spine are negative.  Plain films of the chest as well as hip and back are unremarkable.  Clinically, however, concern for possible nondisplaced right posterior rib fractures, occult lumbar spine injury, and she is tender over her right kidney.  Given her age and degree of pain is atypical, feel she needs  CT imaging for further assessment to rule out occult injury.  Will give analgesia.  She has a history of bad reactions to oxycodone and hydrocodone, but has apparently tolerated Ultram in the past.    Plan to f/u CT, labs, and reassess.   MEDICATIONS GIVEN IN ED: Medications  lidocaine (LIDODERM) 5 % 1 patch (1 patch Transdermal Not Given 11/14/21 2210)  acetaminophen (TYLENOL) tablet 1,000 mg (1,000 mg Oral Given 11/14/21 2209)  ondansetron (ZOFRAN) injection 4 mg (4 mg Intravenous Given 11/14/21 2209)  ketorolac (TORADOL) 30 MG/ML injection 15 mg (15 mg Intravenous Given 11/14/21 2210)  traMADol (ULTRAM) tablet 50 mg (50 mg Oral Given 11/14/21 2210)     Consults:  None   EMR reviewed  PCP notes from Dr. [name] 08/2021 Cardiology notes from Dr. Royann Shivers 09/2021     FINAL CLINICAL IMPRESSION(S) / ED DIAGNOSES   Final diagnoses:  Fall, initial encounter  Right flank pain     Rx / DC Orders   ED Discharge Orders     None        Note:  This document was prepared using Dragon voice recognition software and may include unintentional dictation errors.   Shaune Pollack, MD 11/15/21 [phone]

## 2021-11-15 MED ORDER — ACETAMINOPHEN 500 MG PO TABS: ORAL_TABLET | ORAL | 0 refills | Status: AC

## 2021-11-15 MED ORDER — TRAMADOL HCL 50 MG PO TABS: ORAL_TABLET | ORAL | 0 refills | Status: DC

## 2021-11-15 MED ORDER — TRAMADOL HCL 50 MG PO TABS: ORAL_TABLET | ORAL | 0 refills | Status: AC

## 2021-11-15 MED ORDER — LIDOCAINE 5 % EX PTCH: 1.0000 | MEDICATED_PATCH | Freq: Two times a day (BID) | CUTANEOUS | 0 refills | Status: AC

## 2021-11-15 NOTE — ED Provider Notes (Signed)
----------------------------------------- °  1:15 AM on 11/15/2021 -----------------------------------------   Updated patient and her daughter of CT imaging results which I have personally reviewed as well as the radiology interpretation: Right 10th and 11th rib fractures, nondisplaced without pneumothorax.  Incidental findings of known AAA; pulmonary nodules and cholelithiasis.  Patient will be discharged per previous plan on Ultram, lidocaine patch and close follow-up with her PCP.  We will also add incentive spirometer.  Strict return precautions given.  Patient and family member verbalized understanding and agree with plan of care.  CT chest abdomen pelvis interpreted per Dr. Quintella Reichert:   1. Mildly displaced fracture of the posterior right tenth rib. No  pneumothorax.  2. No acute intra-abdominal or pelvic pathology.  3. A 3.8 cm fusiform infrarenal abdominal aortic aneurysm. Recommend  follow-up ultrasound every 2 years. This recommendation follows ACR  consensus guidelines: White Paper of the ACR Incidental Findings  Committee II on Vascular Findings. J Am Coll Radiol 2013;  10:789-794.  4. Cholelithiasis.  5. Right lung nodules measure up to 4 mm. No follow-up needed if  patient is low-risk (and has no known or suspected primary  neoplasm). Non-contrast chest CT can be considered in 12 months if  patient is high-risk. This recommendation follows the consensus  statement: Guidelines for Management of Incidental Pulmonary Nodules  Detected on CT Images: From the Fleischner Society 2017; Radiology  2017; 284:228-243.  6. Aortic Atherosclerosis (ICD10-I70.0).   CT L-spine interpreted per Dr. Jeannine Boga:  1. No acute traumatic injury within the lumbar spine.  2. Acute minimally displaced fractures of the right posterior tenth  and eleventh ribs.  3. Levoscoliosis with moderate to advanced multilevel degenerative  spondylosis and facet arthrosis as above.  4. Cholelithiasis.  5.  Trace left pleural effusion with associated atelectasis.  6. Moderate hiatal hernia.  7. 3.8 cm infrarenal abdominal aortic aneurysm. Recommend follow-up  every 2 years. This recommendation follows ACR consensus guidelines:  White Paper of the ACR Incidental Findings Committee II on Vascular  Findings. J Am Coll Radiol 2013; 10:789-794.     Paulette Blanch, MD 11/15/21 [phone]

## 2021-11-15 NOTE — Discharge Instructions (Addendum)
For pain:  Take Tylenol 1000 mg three times daily for the next 3 days then as needed Take ultram 50 mg twice daily for 2-3 days then as needed  Apply lidocaine patch twice daily for 1 week Use incentive spirometer as directed  Follow-up with primary doctor in 3-4 days

## 2021-11-16 ENCOUNTER — Encounter: Payer: Self-pay | Admitting: Cardiovascular Disease

## 2021-11-16 ENCOUNTER — Other Ambulatory Visit: Payer: Self-pay | Admitting: Primary Care

## 2021-11-16 DIAGNOSIS — E039 Hypothyroidism, unspecified: Secondary | ICD-10-CM

## 2021-11-20 ENCOUNTER — Other Ambulatory Visit: Payer: Self-pay | Admitting: Primary Care

## 2021-11-23 ENCOUNTER — Other Ambulatory Visit: Payer: Self-pay | Admitting: Primary Care

## 2021-11-23 DIAGNOSIS — M8580 Other specified disorders of bone density and structure, unspecified site: Secondary | ICD-10-CM

## 2021-11-24 DIAGNOSIS — I5032 Chronic diastolic (congestive) heart failure: Secondary | ICD-10-CM | POA: Diagnosis not present

## 2021-11-24 DIAGNOSIS — I2510 Atherosclerotic heart disease of native coronary artery without angina pectoris: Secondary | ICD-10-CM | POA: Diagnosis not present

## 2021-11-25 ENCOUNTER — Ambulatory Visit (INDEPENDENT_AMBULATORY_CARE_PROVIDER_SITE_OTHER): Payer: Medicare Other

## 2021-11-25 DIAGNOSIS — I441 Atrioventricular block, second degree: Secondary | ICD-10-CM

## 2021-11-25 LAB — CUP PACEART REMOTE DEVICE CHECK
Battery Impedance: 396 Ohm
Battery Remaining Longevity: 121 mo
Battery Voltage: 2.77 V
Brady Statistic AP VP Percent: 0 %
Brady Statistic AP VS Percent: 13 %
Brady Statistic AS VP Percent: 0 %
Brady Statistic AS VS Percent: 86 %
Date Time Interrogation Session: 20230131164618
Implantable Lead Implant Date: 20160112
Implantable Lead Implant Date: 20160112
Implantable Lead Location: 753859
Implantable Lead Location: 753860
Implantable Lead Model: 5076
Implantable Lead Model: 5076
Implantable Pulse Generator Implant Date: 20160112
Lead Channel Impedance Value: 472 Ohm
Lead Channel Impedance Value: 568 Ohm
Lead Channel Pacing Threshold Amplitude: 0.75 V
Lead Channel Pacing Threshold Amplitude: 0.75 V
Lead Channel Pacing Threshold Pulse Width: 0.4 ms
Lead Channel Pacing Threshold Pulse Width: 0.4 ms
Lead Channel Setting Pacing Amplitude: 1.5 V
Lead Channel Setting Pacing Amplitude: 2 V
Lead Channel Setting Pacing Pulse Width: 0.4 ms
Lead Channel Setting Sensing Sensitivity: 2.8 mV

## 2021-11-25 LAB — BASIC METABOLIC PANEL
BUN/Creatinine Ratio: 13 (ref 12–28)
BUN: 14 mg/dL (ref 8–27)
CO2: 22 mmol/L (ref 20–29)
Calcium: 8.4 mg/dL — ABNORMAL LOW (ref 8.7–10.3)
Chloride: 105 mmol/L (ref 96–106)
Creatinine, Ser: 1.1 mg/dL — ABNORMAL HIGH (ref 0.57–1.00)
Glucose: 111 mg/dL — ABNORMAL HIGH (ref 70–99)
Potassium: 4.2 mmol/L (ref 3.5–5.2)
Sodium: 144 mmol/L (ref 134–144)
eGFR: 49 mL/min/{1.73_m2} — ABNORMAL LOW (ref >59)

## 2021-11-25 LAB — BRAIN NATRIURETIC PEPTIDE: BNP: 70.4 pg/mL (ref 0.0–100.0)

## 2021-12-02 NOTE — Progress Notes (Signed)
Remote pacemaker transmission.   

## 2021-12-21 ENCOUNTER — Other Ambulatory Visit: Payer: Self-pay | Admitting: Primary Care

## 2021-12-21 DIAGNOSIS — M8580 Other specified disorders of bone density and structure, unspecified site: Secondary | ICD-10-CM

## 2021-12-21 NOTE — Telephone Encounter (Signed)
Last OV/CPE 09-09-21 Last DEXA 06-29-19

## 2021-12-21 NOTE — Telephone Encounter (Signed)
Patient is overdue for bone density scan. Did they ever get this scheduled?

## 2021-12-23 NOTE — Telephone Encounter (Signed)
Called spoke to daughter she has appointment scheduled but had fallen and broke wrist and was told to post pone but now she is scheduled for 01/04/2022 at [facility].  ?

## 2022-01-04 DIAGNOSIS — Z961 Presence of intraocular lens: Secondary | ICD-10-CM | POA: Diagnosis not present

## 2022-01-04 DIAGNOSIS — H43811 Vitreous degeneration, right eye: Secondary | ICD-10-CM | POA: Diagnosis not present

## 2022-01-04 DIAGNOSIS — H33052 Total retinal detachment, left eye: Secondary | ICD-10-CM | POA: Diagnosis not present

## 2022-01-04 DIAGNOSIS — H40011 Open angle with borderline findings, low risk, right eye: Secondary | ICD-10-CM | POA: Diagnosis not present

## 2022-01-04 DIAGNOSIS — H401122 Primary open-angle glaucoma, left eye, moderate stage: Secondary | ICD-10-CM | POA: Diagnosis not present

## 2022-01-14 NOTE — Telephone Encounter (Signed)
Called patient daughter she wanted to know if she needed to have Bone density. Reviewed chart informed daughter that she is on fosamax and if not done may not be refilled. She has been having issues with [facility] and her appointment has been canceled  three times. After talking she is going to call [facility] and see if she can have there. She will let me know if any order or changes need to be made. No further action needed at this time.  ?

## 2022-01-18 ENCOUNTER — Other Ambulatory Visit: Payer: Self-pay | Admitting: Primary Care

## 2022-01-18 DIAGNOSIS — M8580 Other specified disorders of bone density and structure, unspecified site: Secondary | ICD-10-CM

## 2022-02-24 ENCOUNTER — Ambulatory Visit (INDEPENDENT_AMBULATORY_CARE_PROVIDER_SITE_OTHER): Payer: Medicare Other

## 2022-02-24 DIAGNOSIS — I441 Atrioventricular block, second degree: Secondary | ICD-10-CM

## 2022-02-24 LAB — CUP PACEART REMOTE DEVICE CHECK
Battery Impedance: 420 Ohm
Battery Remaining Longevity: 118 mo
Battery Voltage: 2.78 V
Brady Statistic AP VP Percent: 0 %
Brady Statistic AP VS Percent: 12 %
Brady Statistic AS VP Percent: 0 %
Brady Statistic AS VS Percent: 88 %
Date Time Interrogation Session: 20230503085346
Implantable Lead Implant Date: 20160112
Implantable Lead Implant Date: 20160112
Implantable Lead Location: 753859
Implantable Lead Location: 753860
Implantable Lead Model: 5076
Implantable Lead Model: 5076
Implantable Pulse Generator Implant Date: 20160112
Lead Channel Impedance Value: 479 Ohm
Lead Channel Impedance Value: 541 Ohm
Lead Channel Pacing Threshold Amplitude: 0.75 V
Lead Channel Pacing Threshold Amplitude: 0.875 V
Lead Channel Pacing Threshold Pulse Width: 0.4 ms
Lead Channel Pacing Threshold Pulse Width: 0.4 ms
Lead Channel Setting Pacing Amplitude: 1.75 V
Lead Channel Setting Pacing Amplitude: 2 V
Lead Channel Setting Pacing Pulse Width: 0.4 ms
Lead Channel Setting Sensing Sensitivity: 2 mV

## 2022-03-02 ENCOUNTER — Ambulatory Visit (INDEPENDENT_AMBULATORY_CARE_PROVIDER_SITE_OTHER): Payer: Medicare Other

## 2022-03-02 ENCOUNTER — Ambulatory Visit
Admission: EM | Admit: 2022-03-02 | Discharge: 2022-03-02 | Disposition: A | Discharge status: Home / Self Care | Payer: Medicare Other | Attending: Emergency Medicine | Admitting: Emergency Medicine

## 2022-03-02 ENCOUNTER — Encounter: Payer: Self-pay | Admitting: Emergency Medicine

## 2022-03-02 DIAGNOSIS — M1612 Unilateral primary osteoarthritis, left hip: Secondary | ICD-10-CM

## 2022-03-02 DIAGNOSIS — M25552 Pain in left hip: Secondary | ICD-10-CM | POA: Insufficient documentation

## 2022-03-02 DIAGNOSIS — Z471 Aftercare following joint replacement surgery: Secondary | ICD-10-CM | POA: Diagnosis not present

## 2022-03-02 DIAGNOSIS — M25551 Pain in right hip: Secondary | ICD-10-CM | POA: Diagnosis not present

## 2022-03-02 DIAGNOSIS — Z96641 Presence of right artificial hip joint: Secondary | ICD-10-CM | POA: Diagnosis not present

## 2022-03-02 NOTE — Discharge Instructions (Signed)
Your xrays were negative for hip fractures. Osteoarthritis of left hip. May take over the counter meds for symptom management.  ?

## 2022-03-02 NOTE — ED Triage Notes (Signed)
Pt reports left hip pain x 2 weeks and began complaining about right hip pain today. Daughter states pt had a right total hip replacement. Requesting bilateral hip x-rays.  ?

## 2022-03-02 NOTE — ED Provider Notes (Signed)
[facility]    CSN: [id number] Arrival date & time: 03/02/22  1619      History   Chief Complaint Chief Complaint  Patient presents with   Hip Pain    HPI [name] is a 88 y.o. female.   88 year old female patient, [name], presents to urgent care chief complaint of bilateral hip pain.  Per daughter,patient has been complaining of left hip pain for 2 weeks and today started complaining about right hip pain, patient has had a total right hip replacement.  Patient refuses to take any medication per daughter, daughter requesting bilateral hip x-rays.  [facility] no known injury or recent trauma.  Staff at facility state patient has been limping more.  The history is provided by the patient and a relative. No language interpreter was used.   Past Medical History:  Diagnosis Date   AAA (abdominal aortic aneurysm) (HCC) 01/03/13   3.5x3.3cm   Alzheimer's dementia (HCC)    "probably middle stage" (11/05/2014)   Anemia    "hx of chronic" (11/05/2014)   Aortic stenosis 03/29/2008   biological prosthetic replacement   Arthritis    "joints" (11/05/2014)   Asthma    Avascular necrosis of bone of hip, right (HCC) 07/28/2018   Chronic asthmatic bronchitis (HCC)    "q fall and winter" (11/05/2014)   Closed nondisplaced fracture of middle phalanx of lesser toe of right foot 01/05/2021   Coronary artery disease    CABG 03/29/08   Dyslipidemia    Exertional shortness of breath    Family history of adverse reaction to anesthesia    "daughter gets PONV too"   Heart murmur    Hypertension    Hypothyroidism    LBBB (left bundle branch block)    Migraines    "none in years' (11/05/2014)   Mobitz type 2 second degree AV block 10/31/2014   MVP (mitral valve prolapse)    with mild mitral insufficiency/notes 08/17/2013   Near syncope 08/17/2013   PONV (postoperative nausea and vomiting)    Presence of permanent cardiac pacemaker     Sinus pause 10/31/2014   Stroke (HCC) 03/2008   "2 light strokes"; denies residual on 11/05/2014   Urinary frequency     Patient Active Problem List   Diagnosis Date Noted   Osteoarthritis of left hip 03/02/2022   Dizziness 06/26/2019   Skin lesion 06/26/2019   CKD (chronic kidney disease) stage 3, GFR 30-59 ml/min (HCC) 12/27/2018   Actinic keratosis 10/26/2018   Primary osteoarthritis of right hip 07/31/2018   Preventative health care 06/20/2018   Acute hip pain, bilateral 05/17/2018   Altered gait 12/05/2017   Neuropathic pain 12/05/2017   Medicare annual wellness visit, subsequent 06/03/2017   Pacemaker 01/15/2017   Major depressive disorder 11/15/2016   Urinary incontinence 11/15/2016   Mild persistent asthma 11/15/2016   Osteoporosis 11/15/2016   PAT (paroxysmal atrial tachycardia) (HCC) 01/21/2016   Second degree AV block, Mobitz type II 11/05/2014   Sinus pause 10/31/2014   Mobitz type 2 second degree AV block 10/31/2014   AAA 04/02/2013   S/P AVR 2009 - Edwards Magna Ease 21 mm bovine pericardial 04/02/2013   CAD (coronary artery disease) s/p CABG - 2009 (LIMA to LAD, SVG to LCX, SVG to L-PLV 04/02/2013   HTN (hypertension) 04/02/2013   Hyperlipidemia 04/02/2013   MVP (mitral valve prolapse) with mild mitral insufficiency 04/02/2013   Dementia (HCC) 04/02/2013   Hypothyroidism 04/02/2013  Bradycardia 04/02/2013    Past Surgical History:  Procedure Laterality Date   ABDOMINAL HYSTERECTOMY  1977   AORTIC VALVE REPLACEMENT  03/29/2008   PERICARDIAL TISSUE VALVE   APPENDECTOMY  ?1977   CARDIAC CATHETERIZATION  ~ 2009   CATARACT EXTRACTION W/ INTRAOCULAR LENS IMPLANT Left    CORONARY ARTERY BYPASS GRAFT  03/29/2008   LIMA TO LAD,SVG TO CX,SVG TO LEFT POSTEROLATERAL BRANCH   HIP FRACTURE SURGERY Right 06/2010   "3 metal screws"    INSERT / REPLACE / REMOVE PACEMAKER  11/05/2014   LOOP RECORDER EXPLANT N/A 11/05/2014   Procedure: LOOP RECORDER EXPLANT;  Surgeon: [name], MD;  Location: [facility];  Service: Cardiovascular;  Laterality: N/A;   LOOP RECORDER IMPLANT N/A 09/17/2013   Procedure: LOOP RECORDER IMPLANT;  Surgeon: [name], MD;  Location: [facility];  Service: Cardiovascular;  Laterality: N/A;   PERMANENT PACEMAKER INSERTION N/A 11/05/2014   Procedure: PERMANENT PACEMAKER INSERTION;  Surgeon: [name], MD;  Location: [facility];  Service: Cardiovascular;  Laterality: N/A;   REFRACTIVE SURGERY Bilateral    /notes 04/28/2008 (08/17/2013)   RETINAL DETACHMENT SURGERY Left 1994   [name] 04/10/2008 (08/17/2013)   TIBIAL TUBERCLERPLASTY  11/05/2014   tooth pulled   06/2018   TOTAL HIP ARTHROPLASTY Right 07/31/2018   Procedure: RIGHT TOTAL HIP ARTHROPLASTY ANTERIOR APPROACH;  Surgeon: Gean Birchwood, MD;  Location: [facility];  Service: Orthopedics;  Laterality: Right;   UTERINE FIBROID SURGERY  1960's    OB History   No obstetric history on file.      Home Medications    Prior to Admission medications   Medication Sig Start Date End Date Taking? Authorizing Provider  alendronate (FOSAMAX) 70 MG tablet TAKE 1 TABLET BY MOUTH ONCE A WEEK ON SUNDAYS. TAKE WITH A FULL GLASS OF WATERAND NO FOOD. AVOID LAYING DOWN FOR 2 HOURS. NEEDS BONE DENSITY TEST 01/19/22   Doreene Nest, NP  amoxicillin (AMOXIL) 500 MG tablet Take 2,000 mg by mouth See admin instructions. Take 2,000mg  by mouth 1 hour prior to dental appointments 12/10/15   [provider]  aspirin (ASPIRIN LOW DOSE) 81 MG EC tablet Take 1 tablet (81 mg total) by mouth daily. 10/05/21   Croitoru, Mihai, MD  atorvastatin (LIPITOR) 40 MG tablet Take 1 tablet (40 mg total) by mouth daily. For cholesterol. 09/09/21   Doreene Nest, NP  brimonidine (ALPHAGAN) 0.2 % ophthalmic solution Place 1 drop into the left eye 2 (two) times daily. 07/10/18   [provider]  cetirizine (ZYRTEC) 10 MG tablet TAKE 1 TABLET BY MOUTH ONCE DAILY as needed for allergies. 05/20/21    Doreene Nest, NP  donepezil (ARICEPT) 10 MG tablet Take 1 tablet (10 mg total) by mouth at bedtime. For memory 09/10/21   Doreene Nest, NP  fluticasone [facility]) 50 MCG/ACT nasal spray Place 1 spray into both nostrils daily. 09/09/21   Doreene Nest, NP  fluticasone-salmeterol (ADVAIR) 250-50 MCG/ACT AEPB INHALE 1 PUFF INTO THE LUNGS 2 TIMES DAILY FOR COPD. RINSE MOUTH AFTER USE 10/21/21   Doreene Nest, NP  furosemide (LASIX) 40 MG tablet Take 1 tablet (40 mg total) by mouth daily. 10/05/21   Croitoru, Mihai, MD  levothyroxine (SYNTHROID) 50 MCG tablet TAKE 1 TABLET BY MOUTH EVERY MORNING ON AN EMPTY STOMACH WITH WATER ONLY. NO FOOD OR OTHER MEDICATIONS FOR 30 MINS. 11/16/21   Doreene Nest, NP  losartan (COZAAR) 100 MG tablet Take 1  tablet (100 mg total) by mouth daily. for blood pressure 09/09/21   Doreene Nest, NP  memantine (NAMENDA XR) 28 MG CP24 24 hr capsule Take 1 capsule (28 mg total) by mouth at bedtime. For memory. 09/10/21   Doreene Nest, NP  montelukast (SINGULAIR) 10 MG tablet TAKE 1 TABLET BY MOUTH EVERYDAY AT BEDTIME for cough. 09/09/21   Doreene Nest, NP  potassium chloride (KLOR-CON) 10 MEQ tablet Take 2 tablets (20 mEq total) by mouth 2 (two) times daily. 10/05/21   Croitoru, Mihai, MD    Family History Family History  Problem Relation Age of Onset   Heart failure Mother    Diabetes Mother    Hypertension Mother    Hyperlipidemia Mother    Melanoma Grandchild    Colon cancer Neg Hx     Social History Social History   Tobacco Use   Smoking status: Never   Smokeless tobacco: Never  Vaping Use   Vaping Use: Never used  Substance Use Topics   Alcohol use: No    Alcohol/week: 0.0 standard drinks   Drug use: No     Allergies   Codeine and Morphine and related   Review of Systems Review of Systems  Musculoskeletal:  Positive for arthralgias and gait problem.  Skin: Negative.   All other systems reviewed and are  negative.   Physical Exam Triage Vital Signs ED Triage Vitals  Enc Vitals Group     BP 03/02/22 1657 139/78     Pulse Rate 03/02/22 1657 92     Resp 03/02/22 1657 19     Temp 03/02/22 1657 98.5 F (36.9 C)     Temp Source 03/02/22 1657 Oral     SpO2 03/02/22 1657 100 %     Weight 03/02/22 1655 175 lb 0.7 oz (79.4 kg)     Height 03/02/22 1655 5\' 9"  (1.753 m)     Head Circumference --      Peak Flow --      Pain Score 03/02/22 1654 7     Pain Loc --      Pain Edu? --      Excl. in GC? --    No data found.  Updated Vital Signs BP 139/78 (BP Location: Left Arm)   Pulse 92   Temp 98.5 F (36.9 C) (Oral)   Resp 19   Ht 5\' 9"  (1.753 m)   Wt 175 lb 0.7 oz (79.4 kg)   SpO2 100%   BMI 25.85 kg/m   Visual Acuity Right Eye Distance:   Left Eye Distance:   Bilateral Distance:    Right Eye Near:   Left Eye Near:    Bilateral Near:     Physical Exam Vitals and nursing note reviewed.  Constitutional:      General: She is not in acute distress.    Appearance: She is well-developed.  HENT:     Head: Normocephalic and atraumatic.  Eyes:     Conjunctiva/sclera: Conjunctivae normal.  Cardiovascular:     Rate and Rhythm: Normal rate and regular rhythm.     Heart sounds: No murmur heard. Pulmonary:     Effort: Pulmonary effort is normal. No respiratory distress.     Breath sounds: Normal breath sounds.  Abdominal:     Palpations: Abdomen is soft.     Tenderness: There is no abdominal tenderness.  Musculoskeletal:        General: No swelling.     Cervical back: Neck supple.  Right hip: Tenderness present.     Left hip: Tenderness present.     Comments: No bruising no deformity noted  Skin:    General: Skin is warm and dry.     Capillary Refill: Capillary refill takes less than 2 seconds.  Neurological:     General: No focal deficit present.     Mental Status: She is alert.  Psychiatric:        Attention and Perception: Attention normal.        Mood and Affect:  Mood normal.        Speech: Speech normal.        Behavior: Behavior normal.     UC Treatments / Results  Labs (all labs ordered are listed, but only abnormal results are displayed) Labs Reviewed - No data to display  EKG   Radiology DG Hip Unilat With Pelvis 2-3 Views Left  Result Date: 03/02/2022 CLINICAL DATA:  Right hip pain starting today. Left hip pain for 2 weeks. History of right hip arthroplasty. EXAM: DG HIP (WITH OR WITHOUT PELVIS) 2-3V LEFT; DG HIP (WITH OR WITHOUT PELVIS) 2-3V RIGHT COMPARISON:  Right femur radiographs and right hip radiographs 11/14/2021 FINDINGS: Status post total right hip arthroplasty. No perihardware lucency is seen to indicate hardware failure or loosening. Mild pubic symphysis joint space narrowing. The bilateral sacroiliac joint spaces are maintained. No significant left femoroacetabular joint space narrowing. Mild superolateral acetabular degenerative spurring. Mild left L4-5 disc space narrowing. Within the limitations of diffuse decreased bone mineralization no acute fracture is seen. No dislocation. IMPRESSION:: IMPRESSION: 1. Status post total right hip arthroplasty without evidence of hardware failure. 2. Minimal left femoroacetabular osteoarthritis. Electronically Signed   By: Neita Garnet M.D.   On: 03/02/2022 17:35   DG Hip Unilat W or Wo Pelvis 2-3 Views Right  Result Date: 03/02/2022 CLINICAL DATA:  Right hip pain starting today. Left hip pain for 2 weeks. History of right hip arthroplasty. EXAM: DG HIP (WITH OR WITHOUT PELVIS) 2-3V LEFT; DG HIP (WITH OR WITHOUT PELVIS) 2-3V RIGHT COMPARISON:  Right femur radiographs and right hip radiographs 11/14/2021 FINDINGS: Status post total right hip arthroplasty. No perihardware lucency is seen to indicate hardware failure or loosening. Mild pubic symphysis joint space narrowing. The bilateral sacroiliac joint spaces are maintained. No significant left femoroacetabular joint space narrowing. Mild  superolateral acetabular degenerative spurring. Mild left L4-5 disc space narrowing. Within the limitations of diffuse decreased bone mineralization no acute fracture is seen. No dislocation. IMPRESSION:: IMPRESSION: 1. Status post total right hip arthroplasty without evidence of hardware failure. 2. Minimal left femoroacetabular osteoarthritis. Electronically Signed   By: Neita Garnet M.D.   On: 03/02/2022 17:35    Procedures Procedures (including critical care time)  Medications Ordered in UC Medications - No data to display  Initial Impression / Assessment and Plan / UC Course  I have reviewed the triage vital signs and the nursing notes.  Pertinent labs & imaging results that were available during my care of the patient were reviewed by me and considered in my medical decision making (see chart for details).     [id number], bilateral hip pain Final Clinical Impressions(s) / UC Diagnoses   Final diagnoses:  Acute hip pain, bilateral  Osteoarthritis of left hip, unspecified osteoarthritis type     Discharge Instructions      Your xrays were negative for hip fractures. Osteoarthritis of left hip. May take over the counter meds for symptom management.     ED  Prescriptions   None    PDMP not reviewed this encounter.   Clancy Gourd, NP 03/02/22 1950

## 2022-03-09 ENCOUNTER — Ambulatory Visit
Admission: RE | Admit: 2022-03-09 | Discharge: 2022-03-09 | Disposition: A | Discharge status: Home / Self Care | Payer: Medicare Other | Source: Ambulatory Visit | Attending: Primary Care | Admitting: Primary Care

## 2022-03-09 ENCOUNTER — Ambulatory Visit: Payer: Medicare Other | Admitting: Family

## 2022-03-09 DIAGNOSIS — M85832 Other specified disorders of bone density and structure, left forearm: Secondary | ICD-10-CM | POA: Diagnosis not present

## 2022-03-09 DIAGNOSIS — M810 Age-related osteoporosis without current pathological fracture: Secondary | ICD-10-CM | POA: Diagnosis not present

## 2022-03-09 DIAGNOSIS — E2839 Other primary ovarian failure: Secondary | ICD-10-CM | POA: Insufficient documentation

## 2022-03-10 NOTE — Progress Notes (Signed)
Remote pacemaker transmission.   

## 2022-03-11 ENCOUNTER — Other Ambulatory Visit: Payer: Self-pay | Admitting: Family Medicine

## 2022-03-18 NOTE — Telephone Encounter (Signed)
Prescription written and placed in Joellen's inbox.

## 2022-03-18 NOTE — Telephone Encounter (Signed)
Please call [facility], patient's senior living facility. We need to add calcium and vitamin D.   Start Caltrate 600 mg + D3 800 units, 1 tablet  every evening with dinner.

## 2022-03-18 NOTE — Telephone Encounter (Signed)
Will need to have written on script and I will need to fax to them.

## 2022-03-19 NOTE — Telephone Encounter (Signed)
Called [facility] received fax number. Sent order for patient.

## 2022-04-19 ENCOUNTER — Other Ambulatory Visit: Payer: Self-pay | Admitting: Primary Care

## 2022-04-19 DIAGNOSIS — M8580 Other specified disorders of bone density and structure, unspecified site: Secondary | ICD-10-CM

## 2022-05-26 ENCOUNTER — Ambulatory Visit (INDEPENDENT_AMBULATORY_CARE_PROVIDER_SITE_OTHER): Payer: Medicare Other

## 2022-05-26 DIAGNOSIS — I441 Atrioventricular block, second degree: Secondary | ICD-10-CM

## 2022-06-01 ENCOUNTER — Other Ambulatory Visit: Payer: Self-pay | Admitting: Primary Care

## 2022-06-01 DIAGNOSIS — J309 Allergic rhinitis, unspecified: Secondary | ICD-10-CM

## 2022-06-01 LAB — CUP PACEART REMOTE DEVICE CHECK
Battery Impedance: 469 Ohm
Battery Remaining Longevity: 114 mo
Battery Voltage: 2.78 V
Brady Statistic AP VP Percent: 0 %
Brady Statistic AP VS Percent: 8 %
Brady Statistic AS VP Percent: 0 %
Brady Statistic AS VS Percent: 91 %
Date Time Interrogation Session: 20230806190914
Implantable Lead Implant Date: 20160112
Implantable Lead Implant Date: 20160112
Implantable Lead Location: 753859
Implantable Lead Location: 753860
Implantable Lead Model: 5076
Implantable Lead Model: 5076
Implantable Pulse Generator Implant Date: 20160112
Lead Channel Impedance Value: 473 Ohm
Lead Channel Impedance Value: 553 Ohm
Lead Channel Pacing Threshold Amplitude: 0.875 V
Lead Channel Pacing Threshold Amplitude: 0.875 V
Lead Channel Pacing Threshold Pulse Width: 0.4 ms
Lead Channel Pacing Threshold Pulse Width: 0.4 ms
Lead Channel Setting Pacing Amplitude: 1.75 V
Lead Channel Setting Pacing Amplitude: 2 V
Lead Channel Setting Pacing Pulse Width: 0.4 ms
Lead Channel Setting Sensing Sensitivity: 2 mV

## 2022-06-18 NOTE — Progress Notes (Signed)
Remote pacemaker transmission.   

## 2022-07-13 DIAGNOSIS — H401122 Primary open-angle glaucoma, left eye, moderate stage: Secondary | ICD-10-CM | POA: Diagnosis not present

## 2022-07-13 DIAGNOSIS — Z961 Presence of intraocular lens: Secondary | ICD-10-CM | POA: Diagnosis not present

## 2022-07-13 DIAGNOSIS — H33052 Total retinal detachment, left eye: Secondary | ICD-10-CM | POA: Diagnosis not present

## 2022-07-13 DIAGNOSIS — H40011 Open angle with borderline findings, low risk, right eye: Secondary | ICD-10-CM | POA: Diagnosis not present

## 2022-07-13 DIAGNOSIS — H43811 Vitreous degeneration, right eye: Secondary | ICD-10-CM | POA: Diagnosis not present

## 2022-08-03 DIAGNOSIS — Z951 Presence of aortocoronary bypass graft: Secondary | ICD-10-CM | POA: Diagnosis not present

## 2022-08-03 DIAGNOSIS — G309 Alzheimer's disease, unspecified: Secondary | ICD-10-CM | POA: Diagnosis not present

## 2022-08-03 DIAGNOSIS — I129 Hypertensive chronic kidney disease with stage 1 through stage 4 chronic kidney disease, or unspecified chronic kidney disease: Secondary | ICD-10-CM | POA: Diagnosis not present

## 2022-08-03 DIAGNOSIS — N183 Chronic kidney disease, stage 3 unspecified: Secondary | ICD-10-CM | POA: Diagnosis not present

## 2022-08-03 DIAGNOSIS — E039 Hypothyroidism, unspecified: Secondary | ICD-10-CM | POA: Diagnosis not present

## 2022-08-03 DIAGNOSIS — I714 Abdominal aortic aneurysm, without rupture, unspecified: Secondary | ICD-10-CM | POA: Diagnosis not present

## 2022-08-03 DIAGNOSIS — I441 Atrioventricular block, second degree: Secondary | ICD-10-CM | POA: Diagnosis not present

## 2022-08-03 DIAGNOSIS — Z7982 Long term (current) use of aspirin: Secondary | ICD-10-CM | POA: Diagnosis not present

## 2022-08-03 DIAGNOSIS — Z952 Presence of prosthetic heart valve: Secondary | ICD-10-CM | POA: Diagnosis not present

## 2022-08-03 DIAGNOSIS — M810 Age-related osteoporosis without current pathological fracture: Secondary | ICD-10-CM | POA: Diagnosis not present

## 2022-08-03 DIAGNOSIS — F0283 Dementia in other diseases classified elsewhere, unspecified severity, with mood disturbance: Secondary | ICD-10-CM | POA: Diagnosis not present

## 2022-08-03 DIAGNOSIS — M1612 Unilateral primary osteoarthritis, left hip: Secondary | ICD-10-CM | POA: Diagnosis not present

## 2022-08-03 DIAGNOSIS — D649 Anemia, unspecified: Secondary | ICD-10-CM | POA: Diagnosis not present

## 2022-08-03 DIAGNOSIS — Z950 Presence of cardiac pacemaker: Secondary | ICD-10-CM | POA: Diagnosis not present

## 2022-08-03 DIAGNOSIS — J4530 Mild persistent asthma, uncomplicated: Secondary | ICD-10-CM | POA: Diagnosis not present

## 2022-08-03 DIAGNOSIS — Z79899 Other long term (current) drug therapy: Secondary | ICD-10-CM | POA: Diagnosis not present

## 2022-08-03 DIAGNOSIS — G43909 Migraine, unspecified, not intractable, without status migrainosus: Secondary | ICD-10-CM | POA: Diagnosis not present

## 2022-08-03 DIAGNOSIS — I447 Left bundle-branch block, unspecified: Secondary | ICD-10-CM | POA: Diagnosis not present

## 2022-08-03 DIAGNOSIS — E785 Hyperlipidemia, unspecified: Secondary | ICD-10-CM | POA: Diagnosis not present

## 2022-08-03 DIAGNOSIS — Z96641 Presence of right artificial hip joint: Secondary | ICD-10-CM | POA: Diagnosis not present

## 2022-08-03 DIAGNOSIS — M792 Neuralgia and neuritis, unspecified: Secondary | ICD-10-CM | POA: Diagnosis not present

## 2022-08-03 DIAGNOSIS — R350 Frequency of micturition: Secondary | ICD-10-CM | POA: Diagnosis not present

## 2022-08-03 DIAGNOSIS — I2510 Atherosclerotic heart disease of native coronary artery without angina pectoris: Secondary | ICD-10-CM | POA: Diagnosis not present

## 2022-08-06 ENCOUNTER — Encounter: Payer: Self-pay | Admitting: Primary Care

## 2022-08-06 ENCOUNTER — Ambulatory Visit (INDEPENDENT_AMBULATORY_CARE_PROVIDER_SITE_OTHER): Payer: Medicare Other | Admitting: Primary Care

## 2022-08-06 VITALS — BP 102/58 | HR 60 | Temp 99.9°F | Ht 69.0 in | Wt 166.0 lb

## 2022-08-06 DIAGNOSIS — I2510 Atherosclerotic heart disease of native coronary artery without angina pectoris: Secondary | ICD-10-CM | POA: Diagnosis not present

## 2022-08-06 DIAGNOSIS — I10 Essential (primary) hypertension: Secondary | ICD-10-CM

## 2022-08-06 DIAGNOSIS — J4530 Mild persistent asthma, uncomplicated: Secondary | ICD-10-CM

## 2022-08-06 DIAGNOSIS — E039 Hypothyroidism, unspecified: Secondary | ICD-10-CM | POA: Diagnosis not present

## 2022-08-06 DIAGNOSIS — M810 Age-related osteoporosis without current pathological fracture: Secondary | ICD-10-CM | POA: Diagnosis not present

## 2022-08-06 DIAGNOSIS — F03B0 Unspecified dementia, moderate, without behavioral disturbance, psychotic disturbance, mood disturbance, and anxiety: Secondary | ICD-10-CM | POA: Diagnosis not present

## 2022-08-06 DIAGNOSIS — E7800 Pure hypercholesterolemia, unspecified: Secondary | ICD-10-CM | POA: Diagnosis not present

## 2022-08-06 DIAGNOSIS — Z950 Presence of cardiac pacemaker: Secondary | ICD-10-CM | POA: Diagnosis not present

## 2022-08-06 DIAGNOSIS — R410 Disorientation, unspecified: Secondary | ICD-10-CM

## 2022-08-06 DIAGNOSIS — I714 Abdominal aortic aneurysm, without rupture, unspecified: Secondary | ICD-10-CM | POA: Diagnosis not present

## 2022-08-06 DIAGNOSIS — F3342 Major depressive disorder, recurrent, in full remission: Secondary | ICD-10-CM

## 2022-08-06 NOTE — Assessment & Plan Note (Signed)
Continue levothyroxine 50 mcg daily. Repeat TSH pending.

## 2022-08-06 NOTE — Assessment & Plan Note (Signed)
Following with cardiology. Remote pacemaker review reviewed in chart.

## 2022-08-06 NOTE — Assessment & Plan Note (Signed)
Progressing gradually, no agitation or symptoms of sun downing.  Discontinue Aricept 10 mg and Namenda XR 28 mg tablets per patient and daughter's request. This seems reasonable.  Daughter will have assisted living facility monitor for symptoms.

## 2022-08-06 NOTE — Assessment & Plan Note (Signed)
No further monitoring at this time as surgery is not an option. Following with cardiology.

## 2022-08-06 NOTE — Assessment & Plan Note (Signed)
Low today. Seems asymptomatic, no reports of orthostatic symptoms from assisted living.  Continue losartan 100 mg for now. Recommended she have her BP checked to evaluate a pattern of lower BP readings. She will see cardiology in December 2023.  CMP pending.

## 2022-08-06 NOTE — Assessment & Plan Note (Addendum)
Controlled.  Continue Advair 250-50 mcg. Discontinue Singulair 10 mg and Zyrtec 10 mg for now per daughter and patient request.  They will update if symptoms worsen off Singulair.

## 2022-08-06 NOTE — Assessment & Plan Note (Signed)
Asymptomatic.  Continue aspirin 81 mg daily, lipid control, BP control.

## 2022-08-06 NOTE — Assessment & Plan Note (Signed)
Continue atorvastatin 40 mg daily. Repeat lipid panel pending. 

## 2022-08-06 NOTE — Assessment & Plan Note (Signed)
Bone density scan UTD and reviewed.  Continue Fosamax 70 mg weekly

## 2022-08-06 NOTE — Patient Instructions (Addendum)
Stop by the lab prior to leaving today. I will notify you of your results once received.   Stop taking the medications we discussed:  Namenda, Aricept, Zyrtec, Singulair  It was a pleasure to see you today!

## 2022-08-06 NOTE — Progress Notes (Signed)
Subjective:    Patient ID: [name], female    DOB: 1933/12/07, 88 y.o.   MRN: [id number]  HPI  [name] is a very pleasant 88 y.o. female with a history of CAD, hypertension, second-degree heart block with pacemaker/ICD, asthma, hypothyroidism, dementia, osteoporosis, osteoarthritis, CKD, depression who presents today for follow-up of chronic conditions.  Her daughter joins [place] today.  1) Hypertension/Heart Block/Hyperlipidemia/Atrial tachycardia/Hyperlipidemia:   Following with electrophysiology and cardiology and is managed on atorvastatin 40 mg daily, furosemide 40 mg daily, losartan 100 mg daily, potassium chloride 10 mEq daily.  Last office visit with cardiology was in December 2022.  Memory difficulties were documented to be worsening.  During this visit her furosemide was reduced to 40 mg once daily and potassium chloride was reduced.  It was decided to remain off of anticoagulation given her risk for falls.   2) Asthma: Currently managed on Singulair 10 mg daily, Advair 250-50 mcg, 1 puff twice daily. She denies shortness of breath and chest pain. Her daughter has noticed "cotton mouth" at times.   3) Hypothyroidism: Currently managed on levothyroxine 50 mcg daily.  She receives her levothyroxine every morning before breakfast per her assisted living facility for which she resides.  4) Dementia: Currently managed on memantine XR 28 mg daily, donepezil 10 mg daily. The staff at her assisted living place have noticed that she gets turned around a lot, forgets the location of her room. She began speech therapy earlier this week to help with cognition.   Her daughter has noticed that her memory has continued to gradually decline. She asks repetitive questions, doesn't remember family members, recently forgot who her great grandchildren were.   Her daughter has noticed that it takes her longer to get ready for the day. She doesn't tend to like taking showers anymore. She is  wearing the same clothing everyday unless she spills something onto her clothes. Her daughter has not been told about any falls.   She and her daughter would like to minimize her medication list by discontinuing a few medications. Her daughter doesn't believe she is getting the benefits from her memory medication. She denies agitation or symptoms of sundowning.   5) Osteoporosis: Currently managed on alendronate 70 mg weekly.  Her last bone density scan was in May 2023 which reveals T score of -2.7 with osteoporosis to left femur neck and osteopenia to left femur total and left forearm radius.  BP Readings from Last 3 Encounters:  08/06/22 (!) 102/58  03/02/22 139/78  11/15/21 (!) 154/69      Review of Systems  Respiratory:  Negative for shortness of breath.   Cardiovascular:  Negative for chest pain.  Neurological:  Negative for dizziness.  Psychiatric/Behavioral:  Positive for confusion. Negative for hallucinations. The patient is not nervous/anxious.          Past Medical History:  Diagnosis Date   AAA (abdominal aortic aneurysm) ([place]) 01/03/13   3.5x3.3cm   Alzheimer's dementia ([place])    "probably middle stage" (11/05/2014)   Anemia    "hx of chronic" (11/05/2014)   Aortic stenosis 03/29/2008   biological prosthetic replacement   Arthritis    "joints" (11/05/2014)   Asthma    Avascular necrosis of bone of hip, right ( [place]) 07/28/2018   Chronic asthmatic bronchitis    "q fall and winter" (11/05/2014)   Closed nondisplaced fracture of middle phalanx of lesser toe of right foot 01/05/2021   Coronary artery disease    CABG  03/29/08   Dyslipidemia    Exertional shortness of breath    Family history of adverse reaction to anesthesia    "daughter gets PONV too"   Heart murmur    Hypertension    Hypothyroidism    LBBB (left bundle branch block)    Migraines    "none in years' (11/05/2014)   Mobitz type 2 second degree AV block 10/31/2014   MVP (mitral valve prolapse)    with mild  mitral insufficiency/notes 08/17/2013   Near syncope 08/17/2013   PONV (postoperative nausea and vomiting)    Presence of permanent cardiac pacemaker    Sinus pause 10/31/2014   Stroke ([place]) 03/2008   "2 light strokes"; denies residual on 11/05/2014   Urinary frequency     Social History   Socioeconomic History   Marital status: Divorced    Spouse name: Not on file   Number of children: 1   Years of education: Some college   Highest education level: Not on file  Occupational History   Occupation: Retired  Tobacco Use   Smoking status: Never   Smokeless tobacco: Never  Vaping Use   Vaping Use: Never used  Substance and Sexual Activity   Alcohol use: No    Alcohol/week: 0.0 standard drinks of alcohol   Drug use: No   Sexual activity: Not Currently  Other Topics Concern   Not on file  Social History Narrative   Pt lives in single story home with her daughter and son-in-law   Has 1 child   Some college education   Retired Clinical cytogeneticist for the city of [place] Strain: [place]  (06/26/2019)   Overall Financial Resource Strain (CARDIA)    Difficulty of Paying Living Expenses: Not hard at all  Food Insecurity: No [place] (06/26/2019)   Hunger Vital Sign    Worried About Running Out of Food in the Last Year: Never true    Ran Out of Food in the Last Year: Never true  Transportation Needs: No Transportation Needs (06/26/2019)   PRAPARE - Hydrologist (Medical): No    Lack of Transportation (Non-Medical): No  Physical Activity: Inactive (06/26/2019)   Exercise Vital Sign    Days of Exercise per Week: 0 days    Minutes of Exercise per Session: 0 min  Stress: No Stress Concern Present (06/26/2019)   [place]    Feeling of Stress : Not at all  Social Connections: Not on file  Intimate Partner Violence: Not  At Risk (06/26/2019)   Humiliation, Afraid, Rape, and Kick questionnaire    Fear of Current or Ex-Partner: No    Emotionally Abused: No    Physically Abused: No    Sexually Abused: No    Past Surgical History:  Procedure Laterality Date   ABDOMINAL HYSTERECTOMY  1977   AORTIC VALVE REPLACEMENT  03/29/2008   PERICARDIAL TISSUE VALVE   APPENDECTOMY  ?1977   CARDIAC CATHETERIZATION  ~ 2009   CATARACT EXTRACTION W/ INTRAOCULAR LENS IMPLANT Left    CORONARY ARTERY BYPASS GRAFT  03/29/2008   LIMA TO LAD,SVG TO CX,SVG TO LEFT POSTEROLATERAL BRANCH   HIP FRACTURE SURGERY Right 06/2010   "3 metal screws"    INSERT / REPLACE / REMOVE PACEMAKER  11/05/2014   LOOP RECORDER EXPLANT N/A 11/05/2014   Procedure: LOOP RECORDER EXPLANT;  Surgeon: Sanda Klein, MD;  Location: [facility] CATH LAB;  Service: Cardiovascular;  Laterality: N/A;   LOOP RECORDER IMPLANT N/A 09/17/2013   Procedure: LOOP RECORDER IMPLANT;  Surgeon: Sanda Klein, MD;  Location: [facility] CATH LAB;  Service: Cardiovascular;  Laterality: N/A;   PERMANENT PACEMAKER INSERTION N/A 11/05/2014   Procedure: PERMANENT PACEMAKER INSERTION;  Surgeon: Sanda Klein, MD;  Location: [facility] CATH LAB;  Service: Cardiovascular;  Laterality: N/A;   REFRACTIVE SURGERY Bilateral    /notes 04/28/2008 (08/17/2013)   RETINAL DETACHMENT SURGERY Left 1994   Archie Endo 04/10/2008 (08/17/2013)   TIBIAL TUBERCLERPLASTY  11/05/2014   tooth pulled   06/2018   TOTAL HIP ARTHROPLASTY Right 07/31/2018   Procedure: RIGHT TOTAL HIP ARTHROPLASTY ANTERIOR APPROACH;  Surgeon: Frederik Pear, MD;  Location: [facility] ORS;  Service: Orthopedics;  Laterality: Right;   UTERINE FIBROID SURGERY  75's    Family History  Problem Relation Age of Onset   Heart failure Mother    Diabetes Mother    Hypertension Mother    Hyperlipidemia Mother    Melanoma Grandchild    Colon cancer Neg Hx     Allergies  Allergen Reactions   Codeine Nausea And Vomiting and Other (See Comments)    Patient gets  violently ill   Morphine And Related Nausea And Vomiting and Other (See Comments)    Patient get violently ill    Current Outpatient Medications on File Prior to Visit  Medication Sig Dispense Refill   alendronate (FOSAMAX) 70 MG tablet Take 1 tablet (70 mg total) by mouth once a week. No food or other medications for 30 min. Avoid laying flat for two hours. 12 tablet 1   aspirin (ASPIRIN LOW DOSE) 81 MG EC tablet Take 1 tablet (81 mg total) by mouth daily. 30 tablet 2   atorvastatin (LIPITOR) 40 MG tablet Take 1 tablet (40 mg total) by mouth daily. For cholesterol. 90 tablet 3   brimonidine (ALPHAGAN) 0.2 % ophthalmic solution Place 1 drop into the left eye 2 (two) times daily.  3   fluticasone (FLONASE) 50 MCG/ACT nasal spray Place 1 spray into both nostrils daily. 48 g 3   fluticasone-salmeterol (ADVAIR) 250-50 MCG/ACT AEPB INHALE 1 PUFF INTO THE LUNGS 2 TIMES DAILY FOR COPD. RINSE MOUTH AFTER USE 60 each 11   furosemide (LASIX) 40 MG tablet Take 1 tablet (40 mg total) by mouth daily. 90 tablet 3   levothyroxine (SYNTHROID) 50 MCG tablet TAKE 1 TABLET BY MOUTH EVERY MORNING ON AN EMPTY STOMACH WITH WATER ONLY. NO FOOD OR OTHER MEDICATIONS FOR 30 MINS. 90 tablet 2   losartan (COZAAR) 100 MG tablet Take 1 tablet (100 mg total) by mouth daily. for blood pressure 90 tablet 3   potassium chloride (KLOR-CON) 10 MEQ tablet Take 2 tablets (20 mEq total) by mouth 2 (two) times daily. 120 tablet 11   amoxicillin (AMOXIL) 500 MG tablet Take 2,000 mg by mouth See admin instructions. Take 2,000mg  by mouth 1 hour prior to dental appointments (Patient not taking: Reported on 08/06/2022)  1   No current facility-administered medications on file prior to visit.    BP (!) 102/58   Pulse 60   Temp 99.9 F (37.7 C) (Temporal)   Ht 5\' 9"  (1.753 m)   Wt 166 lb (75.3 kg)   SpO2 98%   BMI 24.51 kg/m  Objective:   Physical Exam Cardiovascular:     Rate and Rhythm: Normal rate and regular rhythm.   Pulmonary:     Effort: Pulmonary effort is normal.  Breath sounds: Normal breath sounds.  Abdominal:     General: Bowel sounds are normal.     Palpations: Abdomen is soft.     Tenderness: There is no abdominal tenderness.  Musculoskeletal:     Cervical back: Neck supple.  Skin:    General: Skin is warm and dry.  Neurological:     Mental Status: She is alert.     Comments: Cannot answer questions during HPI           Assessment & Plan:   Problem List Items Addressed This Visit       Cardiovascular and Mediastinum   AAA    No further monitoring at this time as surgery is not an option. Following with cardiology.       CAD (coronary artery disease) s/p CABG - 2009 (LIMA to LAD, SVG to LCX, SVG to L-PLV    Asymptomatic.  Continue aspirin 81 mg daily, lipid control, BP control.       HTN (hypertension)    Low today. Seems asymptomatic, no reports of orthostatic symptoms from assisted living.  Continue losartan 100 mg for now. Recommended she have her BP checked to evaluate a pattern of lower BP readings. She will see cardiology in December 2023.  CMP pending.        Respiratory   Mild persistent asthma    Controlled.  Continue Advair 250-50 mcg. Discontinue Singulair 10 mg and Zyrtec 10 mg for now per daughter and patient request.  They will update if symptoms worsen off Singulair.         Endocrine   Hypothyroidism    Continue levothyroxine 50 mcg daily. Repeat TSH pending.      Relevant Orders   TSH     Nervous and Auditory   Dementia ([place])    Progressing gradually, no agitation or symptoms of sun downing.  Discontinue Aricept 10 mg and Namenda XR 28 mg tablets per patient and daughter's request. This seems reasonable.  Daughter will have assisted living facility monitor for symptoms.         Musculoskeletal and Integument   Osteoporosis    Bone density scan UTD and reviewed.  Continue Fosamax 70 mg weekly        Other    Hyperlipidemia - Primary    Continue atorvastatin 40 mg daily. Repeat lipid panel pending.      Relevant Orders   Lipid panel   Major depressive disorder    Denies concerns.  Continue to monitor.       Pacemaker    Following with cardiology. Remote pacemaker review reviewed in chart.       Other Visit Diagnoses     Essential hypertension       Relevant Orders   Comprehensive metabolic panel   CBC          Pleas Koch, NP

## 2022-08-06 NOTE — Assessment & Plan Note (Signed)
Denies concerns. ° °Continue to monitor.  °

## 2022-08-07 LAB — CBC
HCT: 41.9 % (ref 35.0–45.0)
Hemoglobin: 14.1 g/dL (ref 11.7–15.5)
MCH: 30.5 pg (ref 27.0–33.0)
MCHC: 33.7 g/dL (ref 32.0–36.0)
MCV: 90.5 fL (ref 80.0–100.0)
MPV: 10.5 fL (ref 7.5–12.5)
Platelets: 209 10*3/uL (ref 140–400)
RBC: 4.63 10*6/uL (ref 3.80–5.10)
RDW: 12.6 % (ref 11.0–15.0)
WBC: 5.1 10*3/uL (ref 3.8–10.8)

## 2022-08-07 LAB — COMPREHENSIVE METABOLIC PANEL
AG Ratio: 1.4 (calc) (ref 1.0–2.5)
ALT: 15 U/L (ref 6–29)
AST: 20 U/L (ref 10–35)
Albumin: 4.1 g/dL (ref 3.6–5.1)
Alkaline phosphatase (APISO): 86 U/L (ref 37–153)
BUN/Creatinine Ratio: 12 (calc) (ref 6–22)
BUN: 20 mg/dL (ref 7–25)
CO2: 24 mmol/L (ref 20–32)
Calcium: 9.8 mg/dL (ref 8.6–10.4)
Chloride: 103 mmol/L (ref 98–110)
Creat: 1.73 mg/dL — ABNORMAL HIGH (ref 0.60–0.95)
Globulin: 2.9 g/dL (calc) (ref 1.9–3.7)
Glucose, Bld: 98 mg/dL (ref 65–99)
Potassium: 3.4 mmol/L — ABNORMAL LOW (ref 3.5–5.3)
Sodium: 145 mmol/L (ref 135–146)
Total Bilirubin: 0.8 mg/dL (ref 0.2–1.2)
Total Protein: 7 g/dL (ref 6.1–8.1)

## 2022-08-07 LAB — LIPID PANEL
Cholesterol: 142 mg/dL (ref <200)
HDL: 53 mg/dL (ref >=50)
LDL Cholesterol (Calc): 65 mg/dL (calc)
Non-HDL Cholesterol (Calc): 89 mg/dL (calc) (ref <130)
Total CHOL/HDL Ratio: 2.7 (calc) (ref <5.0)
Triglycerides: 162 mg/dL — ABNORMAL HIGH (ref <150)

## 2022-08-07 LAB — TSH: TSH: 2.72 mIU/L (ref 0.40–4.50)

## 2022-08-09 ENCOUNTER — Other Ambulatory Visit: Payer: Self-pay | Admitting: Primary Care

## 2022-08-09 DIAGNOSIS — N289 Disorder of kidney and ureter, unspecified: Secondary | ICD-10-CM

## 2022-08-11 ENCOUNTER — Other Ambulatory Visit: Payer: Self-pay | Admitting: Primary Care

## 2022-08-12 DIAGNOSIS — Z96641 Presence of right artificial hip joint: Secondary | ICD-10-CM | POA: Diagnosis not present

## 2022-08-12 DIAGNOSIS — E039 Hypothyroidism, unspecified: Secondary | ICD-10-CM | POA: Diagnosis not present

## 2022-08-12 DIAGNOSIS — G43909 Migraine, unspecified, not intractable, without status migrainosus: Secondary | ICD-10-CM | POA: Diagnosis not present

## 2022-08-12 DIAGNOSIS — I447 Left bundle-branch block, unspecified: Secondary | ICD-10-CM | POA: Diagnosis not present

## 2022-08-12 DIAGNOSIS — J4530 Mild persistent asthma, uncomplicated: Secondary | ICD-10-CM | POA: Diagnosis not present

## 2022-08-12 DIAGNOSIS — I714 Abdominal aortic aneurysm, without rupture, unspecified: Secondary | ICD-10-CM | POA: Diagnosis not present

## 2022-08-12 DIAGNOSIS — G309 Alzheimer's disease, unspecified: Secondary | ICD-10-CM | POA: Diagnosis not present

## 2022-08-12 DIAGNOSIS — F0283 Dementia in other diseases classified elsewhere, unspecified severity, with mood disturbance: Secondary | ICD-10-CM | POA: Diagnosis not present

## 2022-08-12 DIAGNOSIS — M1612 Unilateral primary osteoarthritis, left hip: Secondary | ICD-10-CM | POA: Diagnosis not present

## 2022-08-12 DIAGNOSIS — Z79899 Other long term (current) drug therapy: Secondary | ICD-10-CM | POA: Diagnosis not present

## 2022-08-12 DIAGNOSIS — Z951 Presence of aortocoronary bypass graft: Secondary | ICD-10-CM | POA: Diagnosis not present

## 2022-08-12 DIAGNOSIS — M792 Neuralgia and neuritis, unspecified: Secondary | ICD-10-CM | POA: Diagnosis not present

## 2022-08-12 DIAGNOSIS — I129 Hypertensive chronic kidney disease with stage 1 through stage 4 chronic kidney disease, or unspecified chronic kidney disease: Secondary | ICD-10-CM | POA: Diagnosis not present

## 2022-08-12 DIAGNOSIS — R350 Frequency of micturition: Secondary | ICD-10-CM | POA: Diagnosis not present

## 2022-08-12 DIAGNOSIS — E785 Hyperlipidemia, unspecified: Secondary | ICD-10-CM | POA: Diagnosis not present

## 2022-08-12 DIAGNOSIS — I441 Atrioventricular block, second degree: Secondary | ICD-10-CM | POA: Diagnosis not present

## 2022-08-12 DIAGNOSIS — Z950 Presence of cardiac pacemaker: Secondary | ICD-10-CM | POA: Diagnosis not present

## 2022-08-12 DIAGNOSIS — Z7982 Long term (current) use of aspirin: Secondary | ICD-10-CM | POA: Diagnosis not present

## 2022-08-12 DIAGNOSIS — I2510 Atherosclerotic heart disease of native coronary artery without angina pectoris: Secondary | ICD-10-CM | POA: Diagnosis not present

## 2022-08-12 DIAGNOSIS — Z952 Presence of prosthetic heart valve: Secondary | ICD-10-CM | POA: Diagnosis not present

## 2022-08-12 DIAGNOSIS — M810 Age-related osteoporosis without current pathological fracture: Secondary | ICD-10-CM | POA: Diagnosis not present

## 2022-08-12 DIAGNOSIS — D649 Anemia, unspecified: Secondary | ICD-10-CM | POA: Diagnosis not present

## 2022-08-12 DIAGNOSIS — N183 Chronic kidney disease, stage 3 unspecified: Secondary | ICD-10-CM | POA: Diagnosis not present

## 2022-08-13 ENCOUNTER — Telehealth: Payer: Self-pay | Admitting: Primary Care

## 2022-08-13 ENCOUNTER — Other Ambulatory Visit (INDEPENDENT_AMBULATORY_CARE_PROVIDER_SITE_OTHER): Payer: Medicare Other

## 2022-08-13 DIAGNOSIS — N289 Disorder of kidney and ureter, unspecified: Secondary | ICD-10-CM

## 2022-08-13 DIAGNOSIS — R410 Disorientation, unspecified: Secondary | ICD-10-CM | POA: Diagnosis not present

## 2022-08-13 LAB — POC URINALSYSI DIPSTICK (AUTOMATED)
Bilirubin, UA: NEGATIVE
Blood, UA: NEGATIVE
Glucose, UA: NEGATIVE
Ketones, UA: NEGATIVE
Leukocytes, UA: NEGATIVE
Nitrite, UA: NEGATIVE
Protein, UA: NEGATIVE
Spec Grav, UA: <=1.005 — AB (ref 1.010–1.025)
Urobilinogen, UA: 0.2 E.U./dL
pH, UA: 7.5 (ref 5.0–8.0)

## 2022-08-13 NOTE — Telephone Encounter (Signed)
Pt's daughter dropped off a report from [facility] for [place] to review.

## 2022-08-13 NOTE — Telephone Encounter (Signed)
Form placed in [place] box for review.

## 2022-08-13 NOTE — Telephone Encounter (Signed)
Form will be in pcp's folder

## 2022-08-14 LAB — BASIC METABOLIC PANEL WITH GFR
BUN/Creatinine Ratio: 7 (calc) (ref 6–22)
BUN: 9 mg/dL (ref 7–25)
CO2: 25 mmol/L (ref 20–32)
Calcium: 9.3 mg/dL (ref 8.6–10.4)
Chloride: 104 mmol/L (ref 98–110)
Creat: 1.36 mg/dL — ABNORMAL HIGH (ref 0.60–0.95)
Glucose, Bld: 107 mg/dL — ABNORMAL HIGH (ref 65–99)
Potassium: 4.6 mmol/L (ref 3.5–5.3)
Sodium: 141 mmol/L (ref 135–146)
eGFR: 38 mL/min/{1.73_m2} — ABNORMAL LOW (ref >=60)

## 2022-08-14 LAB — URINE CULTURE
MICRO NUMBER:: 14079908
SPECIMEN QUALITY:: ADEQUATE

## 2022-08-17 DIAGNOSIS — I714 Abdominal aortic aneurysm, without rupture, unspecified: Secondary | ICD-10-CM | POA: Diagnosis not present

## 2022-08-17 DIAGNOSIS — G43909 Migraine, unspecified, not intractable, without status migrainosus: Secondary | ICD-10-CM | POA: Diagnosis not present

## 2022-08-17 DIAGNOSIS — F0283 Dementia in other diseases classified elsewhere, unspecified severity, with mood disturbance: Secondary | ICD-10-CM | POA: Diagnosis not present

## 2022-08-17 DIAGNOSIS — E785 Hyperlipidemia, unspecified: Secondary | ICD-10-CM | POA: Diagnosis not present

## 2022-08-17 DIAGNOSIS — E039 Hypothyroidism, unspecified: Secondary | ICD-10-CM | POA: Diagnosis not present

## 2022-08-17 DIAGNOSIS — M792 Neuralgia and neuritis, unspecified: Secondary | ICD-10-CM | POA: Diagnosis not present

## 2022-08-17 DIAGNOSIS — M1612 Unilateral primary osteoarthritis, left hip: Secondary | ICD-10-CM | POA: Diagnosis not present

## 2022-08-17 DIAGNOSIS — M810 Age-related osteoporosis without current pathological fracture: Secondary | ICD-10-CM | POA: Diagnosis not present

## 2022-08-17 DIAGNOSIS — Z950 Presence of cardiac pacemaker: Secondary | ICD-10-CM | POA: Diagnosis not present

## 2022-08-17 DIAGNOSIS — J4530 Mild persistent asthma, uncomplicated: Secondary | ICD-10-CM | POA: Diagnosis not present

## 2022-08-17 DIAGNOSIS — Z96641 Presence of right artificial hip joint: Secondary | ICD-10-CM | POA: Diagnosis not present

## 2022-08-17 DIAGNOSIS — N183 Chronic kidney disease, stage 3 unspecified: Secondary | ICD-10-CM | POA: Diagnosis not present

## 2022-08-17 DIAGNOSIS — I129 Hypertensive chronic kidney disease with stage 1 through stage 4 chronic kidney disease, or unspecified chronic kidney disease: Secondary | ICD-10-CM | POA: Diagnosis not present

## 2022-08-17 DIAGNOSIS — Z79899 Other long term (current) drug therapy: Secondary | ICD-10-CM | POA: Diagnosis not present

## 2022-08-17 DIAGNOSIS — I447 Left bundle-branch block, unspecified: Secondary | ICD-10-CM | POA: Diagnosis not present

## 2022-08-17 DIAGNOSIS — D649 Anemia, unspecified: Secondary | ICD-10-CM | POA: Diagnosis not present

## 2022-08-17 DIAGNOSIS — Z951 Presence of aortocoronary bypass graft: Secondary | ICD-10-CM | POA: Diagnosis not present

## 2022-08-17 DIAGNOSIS — I2510 Atherosclerotic heart disease of native coronary artery without angina pectoris: Secondary | ICD-10-CM | POA: Diagnosis not present

## 2022-08-17 DIAGNOSIS — Z952 Presence of prosthetic heart valve: Secondary | ICD-10-CM | POA: Diagnosis not present

## 2022-08-17 DIAGNOSIS — G309 Alzheimer's disease, unspecified: Secondary | ICD-10-CM | POA: Diagnosis not present

## 2022-08-17 DIAGNOSIS — R350 Frequency of micturition: Secondary | ICD-10-CM | POA: Diagnosis not present

## 2022-08-17 DIAGNOSIS — Z7982 Long term (current) use of aspirin: Secondary | ICD-10-CM | POA: Diagnosis not present

## 2022-08-17 DIAGNOSIS — I441 Atrioventricular block, second degree: Secondary | ICD-10-CM | POA: Diagnosis not present

## 2022-08-20 ENCOUNTER — Telehealth: Payer: Self-pay | Admitting: Primary Care

## 2022-08-20 NOTE — Telephone Encounter (Signed)
Home Health verbal orders [place] Name: [facility] number: [phone] Fax number: [phone]  Requesting OT/PT/Skilled nursing/Social Work/Speech: PT &OT  Reason:needs more assistance with daily activities  Frequency: Needs orders for [facility] HHto carry out PT and OT with patient  Please forward to [facility] pool or providers CMA

## 2022-08-20 NOTE — Telephone Encounter (Signed)
Approved.  

## 2022-08-20 NOTE — Telephone Encounter (Signed)
Called Kelsey with [facility] back. She stated she needs written orders, not verbal. She will be getting the PT to fax over a request.

## 2022-08-24 DIAGNOSIS — I447 Left bundle-branch block, unspecified: Secondary | ICD-10-CM | POA: Diagnosis not present

## 2022-08-24 DIAGNOSIS — Z96641 Presence of right artificial hip joint: Secondary | ICD-10-CM | POA: Diagnosis not present

## 2022-08-24 DIAGNOSIS — E785 Hyperlipidemia, unspecified: Secondary | ICD-10-CM | POA: Diagnosis not present

## 2022-08-24 DIAGNOSIS — I714 Abdominal aortic aneurysm, without rupture, unspecified: Secondary | ICD-10-CM | POA: Diagnosis not present

## 2022-08-24 DIAGNOSIS — D649 Anemia, unspecified: Secondary | ICD-10-CM | POA: Diagnosis not present

## 2022-08-24 DIAGNOSIS — Z7982 Long term (current) use of aspirin: Secondary | ICD-10-CM | POA: Diagnosis not present

## 2022-08-24 DIAGNOSIS — N183 Chronic kidney disease, stage 3 unspecified: Secondary | ICD-10-CM | POA: Diagnosis not present

## 2022-08-24 DIAGNOSIS — Z950 Presence of cardiac pacemaker: Secondary | ICD-10-CM | POA: Diagnosis not present

## 2022-08-24 DIAGNOSIS — Z952 Presence of prosthetic heart valve: Secondary | ICD-10-CM | POA: Diagnosis not present

## 2022-08-24 DIAGNOSIS — Z79899 Other long term (current) drug therapy: Secondary | ICD-10-CM | POA: Diagnosis not present

## 2022-08-24 DIAGNOSIS — I129 Hypertensive chronic kidney disease with stage 1 through stage 4 chronic kidney disease, or unspecified chronic kidney disease: Secondary | ICD-10-CM | POA: Diagnosis not present

## 2022-08-24 DIAGNOSIS — I441 Atrioventricular block, second degree: Secondary | ICD-10-CM | POA: Diagnosis not present

## 2022-08-24 DIAGNOSIS — Z951 Presence of aortocoronary bypass graft: Secondary | ICD-10-CM | POA: Diagnosis not present

## 2022-08-24 DIAGNOSIS — G43909 Migraine, unspecified, not intractable, without status migrainosus: Secondary | ICD-10-CM | POA: Diagnosis not present

## 2022-08-24 DIAGNOSIS — M810 Age-related osteoporosis without current pathological fracture: Secondary | ICD-10-CM | POA: Diagnosis not present

## 2022-08-24 DIAGNOSIS — I2510 Atherosclerotic heart disease of native coronary artery without angina pectoris: Secondary | ICD-10-CM | POA: Diagnosis not present

## 2022-08-24 DIAGNOSIS — M792 Neuralgia and neuritis, unspecified: Secondary | ICD-10-CM | POA: Diagnosis not present

## 2022-08-24 DIAGNOSIS — M1612 Unilateral primary osteoarthritis, left hip: Secondary | ICD-10-CM | POA: Diagnosis not present

## 2022-08-24 DIAGNOSIS — G309 Alzheimer's disease, unspecified: Secondary | ICD-10-CM | POA: Diagnosis not present

## 2022-08-24 DIAGNOSIS — F0283 Dementia in other diseases classified elsewhere, unspecified severity, with mood disturbance: Secondary | ICD-10-CM | POA: Diagnosis not present

## 2022-08-24 DIAGNOSIS — E039 Hypothyroidism, unspecified: Secondary | ICD-10-CM | POA: Diagnosis not present

## 2022-08-24 DIAGNOSIS — J4530 Mild persistent asthma, uncomplicated: Secondary | ICD-10-CM | POA: Diagnosis not present

## 2022-08-24 DIAGNOSIS — R350 Frequency of micturition: Secondary | ICD-10-CM | POA: Diagnosis not present

## 2022-08-25 ENCOUNTER — Ambulatory Visit: Payer: Medicare Other

## 2022-08-25 DIAGNOSIS — I441 Atrioventricular block, second degree: Secondary | ICD-10-CM

## 2022-08-26 NOTE — Telephone Encounter (Signed)
The patient daughter [name] called and states she will send missed transmission on 08/27/2022 when she get off work. I told her that will be fine.

## 2022-08-27 ENCOUNTER — Ambulatory Visit (INDEPENDENT_AMBULATORY_CARE_PROVIDER_SITE_OTHER): Payer: Medicare Other

## 2022-08-27 DIAGNOSIS — I441 Atrioventricular block, second degree: Secondary | ICD-10-CM | POA: Diagnosis not present

## 2022-08-30 LAB — CUP PACEART REMOTE DEVICE CHECK
Battery Impedance: 493 Ohm
Battery Remaining Longevity: 111 mo
Battery Voltage: 2.78 V
Brady Statistic AP VP Percent: 0 %
Brady Statistic AP VS Percent: 9 %
Brady Statistic AS VP Percent: 0 %
Brady Statistic AS VS Percent: 91 %
Date Time Interrogation Session: 20231105155923
Implantable Lead Connection Status: 753985
Implantable Lead Connection Status: 753985
Implantable Lead Implant Date: 20160112
Implantable Lead Implant Date: 20160112
Implantable Lead Location: 753859
Implantable Lead Location: 753860
Implantable Lead Model: 5076
Implantable Lead Model: 5076
Implantable Pulse Generator Implant Date: 20160112
Lead Channel Impedance Value: 493 Ohm
Lead Channel Impedance Value: 592 Ohm
Lead Channel Pacing Threshold Amplitude: 0.75 V
Lead Channel Pacing Threshold Amplitude: 0.875 V
Lead Channel Pacing Threshold Pulse Width: 0.4 ms
Lead Channel Pacing Threshold Pulse Width: 0.4 ms
Lead Channel Setting Pacing Amplitude: 1.75 V
Lead Channel Setting Pacing Amplitude: 2 V
Lead Channel Setting Pacing Pulse Width: 0.4 ms
Lead Channel Setting Sensing Sensitivity: 2.8 mV
Zone Setting Status: 755011
Zone Setting Status: 755011

## 2022-08-31 DIAGNOSIS — E785 Hyperlipidemia, unspecified: Secondary | ICD-10-CM | POA: Diagnosis not present

## 2022-08-31 DIAGNOSIS — I129 Hypertensive chronic kidney disease with stage 1 through stage 4 chronic kidney disease, or unspecified chronic kidney disease: Secondary | ICD-10-CM | POA: Diagnosis not present

## 2022-08-31 DIAGNOSIS — I441 Atrioventricular block, second degree: Secondary | ICD-10-CM | POA: Diagnosis not present

## 2022-08-31 DIAGNOSIS — J4530 Mild persistent asthma, uncomplicated: Secondary | ICD-10-CM | POA: Diagnosis not present

## 2022-08-31 DIAGNOSIS — G43909 Migraine, unspecified, not intractable, without status migrainosus: Secondary | ICD-10-CM | POA: Diagnosis not present

## 2022-08-31 DIAGNOSIS — M810 Age-related osteoporosis without current pathological fracture: Secondary | ICD-10-CM | POA: Diagnosis not present

## 2022-08-31 DIAGNOSIS — E039 Hypothyroidism, unspecified: Secondary | ICD-10-CM | POA: Diagnosis not present

## 2022-08-31 DIAGNOSIS — G309 Alzheimer's disease, unspecified: Secondary | ICD-10-CM | POA: Diagnosis not present

## 2022-08-31 DIAGNOSIS — N183 Chronic kidney disease, stage 3 unspecified: Secondary | ICD-10-CM | POA: Diagnosis not present

## 2022-08-31 DIAGNOSIS — Z951 Presence of aortocoronary bypass graft: Secondary | ICD-10-CM | POA: Diagnosis not present

## 2022-08-31 DIAGNOSIS — I714 Abdominal aortic aneurysm, without rupture, unspecified: Secondary | ICD-10-CM | POA: Diagnosis not present

## 2022-08-31 DIAGNOSIS — Z79899 Other long term (current) drug therapy: Secondary | ICD-10-CM | POA: Diagnosis not present

## 2022-08-31 DIAGNOSIS — F0283 Dementia in other diseases classified elsewhere, unspecified severity, with mood disturbance: Secondary | ICD-10-CM | POA: Diagnosis not present

## 2022-08-31 DIAGNOSIS — Z7982 Long term (current) use of aspirin: Secondary | ICD-10-CM | POA: Diagnosis not present

## 2022-08-31 DIAGNOSIS — M1612 Unilateral primary osteoarthritis, left hip: Secondary | ICD-10-CM | POA: Diagnosis not present

## 2022-08-31 DIAGNOSIS — D649 Anemia, unspecified: Secondary | ICD-10-CM | POA: Diagnosis not present

## 2022-08-31 DIAGNOSIS — M792 Neuralgia and neuritis, unspecified: Secondary | ICD-10-CM | POA: Diagnosis not present

## 2022-08-31 DIAGNOSIS — Z952 Presence of prosthetic heart valve: Secondary | ICD-10-CM | POA: Diagnosis not present

## 2022-08-31 DIAGNOSIS — I2510 Atherosclerotic heart disease of native coronary artery without angina pectoris: Secondary | ICD-10-CM | POA: Diagnosis not present

## 2022-08-31 DIAGNOSIS — Z950 Presence of cardiac pacemaker: Secondary | ICD-10-CM | POA: Diagnosis not present

## 2022-08-31 DIAGNOSIS — R350 Frequency of micturition: Secondary | ICD-10-CM | POA: Diagnosis not present

## 2022-08-31 DIAGNOSIS — Z96641 Presence of right artificial hip joint: Secondary | ICD-10-CM | POA: Diagnosis not present

## 2022-08-31 DIAGNOSIS — I447 Left bundle-branch block, unspecified: Secondary | ICD-10-CM | POA: Diagnosis not present

## 2022-09-02 ENCOUNTER — Telehealth: Payer: Self-pay | Admitting: Primary Care

## 2022-09-02 NOTE — Telephone Encounter (Signed)
Left secured voicemail with [name] from [facility]. Stating we received the faxed orders. Jae Dire is out of the office until 11/14, so there will be a delay in response to getting those orders signed. Assured they were placed in [place] inbox for completion upon her return.

## 2022-09-02 NOTE — Telephone Encounter (Signed)
Brownee called from [facility] and stated they are faxing over orders for PT and OT. Call back number [phone] request to speak with [facility].

## 2022-09-04 ENCOUNTER — Other Ambulatory Visit: Payer: Self-pay | Admitting: Primary Care

## 2022-09-04 DIAGNOSIS — E039 Hypothyroidism, unspecified: Secondary | ICD-10-CM

## 2022-09-06 NOTE — Telephone Encounter (Signed)
[name] called back in following up on the faxes. Informed her that [name] is out of the office and returning tomorrow.

## 2022-09-06 NOTE — Telephone Encounter (Signed)
Spoke with Seward Grater from [facility] stated we have received the faxed orders and as I stated in the voicemail I left her, Jae Dire is out of the office until 11/14 and this will be addressed as soon as possible when she returns.

## 2022-09-07 DIAGNOSIS — R350 Frequency of micturition: Secondary | ICD-10-CM | POA: Diagnosis not present

## 2022-09-07 DIAGNOSIS — G309 Alzheimer's disease, unspecified: Secondary | ICD-10-CM | POA: Diagnosis not present

## 2022-09-07 DIAGNOSIS — Z7982 Long term (current) use of aspirin: Secondary | ICD-10-CM | POA: Diagnosis not present

## 2022-09-07 DIAGNOSIS — I129 Hypertensive chronic kidney disease with stage 1 through stage 4 chronic kidney disease, or unspecified chronic kidney disease: Secondary | ICD-10-CM | POA: Diagnosis not present

## 2022-09-07 DIAGNOSIS — N183 Chronic kidney disease, stage 3 unspecified: Secondary | ICD-10-CM | POA: Diagnosis not present

## 2022-09-07 DIAGNOSIS — I714 Abdominal aortic aneurysm, without rupture, unspecified: Secondary | ICD-10-CM | POA: Diagnosis not present

## 2022-09-07 DIAGNOSIS — G43909 Migraine, unspecified, not intractable, without status migrainosus: Secondary | ICD-10-CM | POA: Diagnosis not present

## 2022-09-07 DIAGNOSIS — I447 Left bundle-branch block, unspecified: Secondary | ICD-10-CM | POA: Diagnosis not present

## 2022-09-07 DIAGNOSIS — D649 Anemia, unspecified: Secondary | ICD-10-CM | POA: Diagnosis not present

## 2022-09-07 DIAGNOSIS — E039 Hypothyroidism, unspecified: Secondary | ICD-10-CM | POA: Diagnosis not present

## 2022-09-07 DIAGNOSIS — Z79899 Other long term (current) drug therapy: Secondary | ICD-10-CM | POA: Diagnosis not present

## 2022-09-07 DIAGNOSIS — M810 Age-related osteoporosis without current pathological fracture: Secondary | ICD-10-CM | POA: Diagnosis not present

## 2022-09-07 DIAGNOSIS — Z950 Presence of cardiac pacemaker: Secondary | ICD-10-CM | POA: Diagnosis not present

## 2022-09-07 DIAGNOSIS — F0283 Dementia in other diseases classified elsewhere, unspecified severity, with mood disturbance: Secondary | ICD-10-CM | POA: Diagnosis not present

## 2022-09-07 DIAGNOSIS — E785 Hyperlipidemia, unspecified: Secondary | ICD-10-CM | POA: Diagnosis not present

## 2022-09-07 DIAGNOSIS — Z96641 Presence of right artificial hip joint: Secondary | ICD-10-CM | POA: Diagnosis not present

## 2022-09-07 DIAGNOSIS — Z951 Presence of aortocoronary bypass graft: Secondary | ICD-10-CM | POA: Diagnosis not present

## 2022-09-07 DIAGNOSIS — J4530 Mild persistent asthma, uncomplicated: Secondary | ICD-10-CM | POA: Diagnosis not present

## 2022-09-07 DIAGNOSIS — M1612 Unilateral primary osteoarthritis, left hip: Secondary | ICD-10-CM | POA: Diagnosis not present

## 2022-09-07 DIAGNOSIS — M792 Neuralgia and neuritis, unspecified: Secondary | ICD-10-CM | POA: Diagnosis not present

## 2022-09-07 DIAGNOSIS — Z952 Presence of prosthetic heart valve: Secondary | ICD-10-CM | POA: Diagnosis not present

## 2022-09-07 DIAGNOSIS — I2510 Atherosclerotic heart disease of native coronary artery without angina pectoris: Secondary | ICD-10-CM | POA: Diagnosis not present

## 2022-09-07 DIAGNOSIS — I441 Atrioventricular block, second degree: Secondary | ICD-10-CM | POA: Diagnosis not present

## 2022-09-07 NOTE — Progress Notes (Signed)
Remote pacemaker transmission.   

## 2022-09-08 DIAGNOSIS — I714 Abdominal aortic aneurysm, without rupture, unspecified: Secondary | ICD-10-CM | POA: Diagnosis not present

## 2022-09-08 DIAGNOSIS — Z96641 Presence of right artificial hip joint: Secondary | ICD-10-CM | POA: Diagnosis not present

## 2022-09-08 DIAGNOSIS — Z952 Presence of prosthetic heart valve: Secondary | ICD-10-CM | POA: Diagnosis not present

## 2022-09-08 DIAGNOSIS — I129 Hypertensive chronic kidney disease with stage 1 through stage 4 chronic kidney disease, or unspecified chronic kidney disease: Secondary | ICD-10-CM | POA: Diagnosis not present

## 2022-09-08 DIAGNOSIS — I2510 Atherosclerotic heart disease of native coronary artery without angina pectoris: Secondary | ICD-10-CM | POA: Diagnosis not present

## 2022-09-08 DIAGNOSIS — M792 Neuralgia and neuritis, unspecified: Secondary | ICD-10-CM | POA: Diagnosis not present

## 2022-09-08 DIAGNOSIS — E039 Hypothyroidism, unspecified: Secondary | ICD-10-CM | POA: Diagnosis not present

## 2022-09-08 DIAGNOSIS — G43909 Migraine, unspecified, not intractable, without status migrainosus: Secondary | ICD-10-CM | POA: Diagnosis not present

## 2022-09-08 DIAGNOSIS — Z950 Presence of cardiac pacemaker: Secondary | ICD-10-CM | POA: Diagnosis not present

## 2022-09-08 DIAGNOSIS — J4530 Mild persistent asthma, uncomplicated: Secondary | ICD-10-CM | POA: Diagnosis not present

## 2022-09-08 DIAGNOSIS — Z951 Presence of aortocoronary bypass graft: Secondary | ICD-10-CM | POA: Diagnosis not present

## 2022-09-08 DIAGNOSIS — Z7982 Long term (current) use of aspirin: Secondary | ICD-10-CM | POA: Diagnosis not present

## 2022-09-08 DIAGNOSIS — N183 Chronic kidney disease, stage 3 unspecified: Secondary | ICD-10-CM | POA: Diagnosis not present

## 2022-09-08 DIAGNOSIS — I447 Left bundle-branch block, unspecified: Secondary | ICD-10-CM | POA: Diagnosis not present

## 2022-09-08 DIAGNOSIS — M1612 Unilateral primary osteoarthritis, left hip: Secondary | ICD-10-CM | POA: Diagnosis not present

## 2022-09-08 DIAGNOSIS — Z79899 Other long term (current) drug therapy: Secondary | ICD-10-CM | POA: Diagnosis not present

## 2022-09-08 DIAGNOSIS — M810 Age-related osteoporosis without current pathological fracture: Secondary | ICD-10-CM | POA: Diagnosis not present

## 2022-09-08 DIAGNOSIS — E785 Hyperlipidemia, unspecified: Secondary | ICD-10-CM | POA: Diagnosis not present

## 2022-09-08 DIAGNOSIS — I441 Atrioventricular block, second degree: Secondary | ICD-10-CM | POA: Diagnosis not present

## 2022-09-08 DIAGNOSIS — G309 Alzheimer's disease, unspecified: Secondary | ICD-10-CM | POA: Diagnosis not present

## 2022-09-08 DIAGNOSIS — R350 Frequency of micturition: Secondary | ICD-10-CM | POA: Diagnosis not present

## 2022-09-08 DIAGNOSIS — F0283 Dementia in other diseases classified elsewhere, unspecified severity, with mood disturbance: Secondary | ICD-10-CM | POA: Diagnosis not present

## 2022-09-08 DIAGNOSIS — D649 Anemia, unspecified: Secondary | ICD-10-CM | POA: Diagnosis not present

## 2022-09-09 NOTE — Progress Notes (Signed)
Remote pacemaker transmission.   

## 2022-09-13 DIAGNOSIS — Z7982 Long term (current) use of aspirin: Secondary | ICD-10-CM | POA: Diagnosis not present

## 2022-09-13 DIAGNOSIS — D649 Anemia, unspecified: Secondary | ICD-10-CM | POA: Diagnosis not present

## 2022-09-13 DIAGNOSIS — Z950 Presence of cardiac pacemaker: Secondary | ICD-10-CM | POA: Diagnosis not present

## 2022-09-13 DIAGNOSIS — Z952 Presence of prosthetic heart valve: Secondary | ICD-10-CM | POA: Diagnosis not present

## 2022-09-13 DIAGNOSIS — I447 Left bundle-branch block, unspecified: Secondary | ICD-10-CM | POA: Diagnosis not present

## 2022-09-13 DIAGNOSIS — M810 Age-related osteoporosis without current pathological fracture: Secondary | ICD-10-CM | POA: Diagnosis not present

## 2022-09-13 DIAGNOSIS — G43909 Migraine, unspecified, not intractable, without status migrainosus: Secondary | ICD-10-CM | POA: Diagnosis not present

## 2022-09-13 DIAGNOSIS — M792 Neuralgia and neuritis, unspecified: Secondary | ICD-10-CM | POA: Diagnosis not present

## 2022-09-13 DIAGNOSIS — F0283 Dementia in other diseases classified elsewhere, unspecified severity, with mood disturbance: Secondary | ICD-10-CM | POA: Diagnosis not present

## 2022-09-13 DIAGNOSIS — I2510 Atherosclerotic heart disease of native coronary artery without angina pectoris: Secondary | ICD-10-CM | POA: Diagnosis not present

## 2022-09-13 DIAGNOSIS — I441 Atrioventricular block, second degree: Secondary | ICD-10-CM | POA: Diagnosis not present

## 2022-09-13 DIAGNOSIS — E039 Hypothyroidism, unspecified: Secondary | ICD-10-CM | POA: Diagnosis not present

## 2022-09-13 DIAGNOSIS — J4530 Mild persistent asthma, uncomplicated: Secondary | ICD-10-CM | POA: Diagnosis not present

## 2022-09-13 DIAGNOSIS — E785 Hyperlipidemia, unspecified: Secondary | ICD-10-CM | POA: Diagnosis not present

## 2022-09-13 DIAGNOSIS — I714 Abdominal aortic aneurysm, without rupture, unspecified: Secondary | ICD-10-CM | POA: Diagnosis not present

## 2022-09-13 DIAGNOSIS — R350 Frequency of micturition: Secondary | ICD-10-CM | POA: Diagnosis not present

## 2022-09-13 DIAGNOSIS — Z79899 Other long term (current) drug therapy: Secondary | ICD-10-CM | POA: Diagnosis not present

## 2022-09-13 DIAGNOSIS — Z951 Presence of aortocoronary bypass graft: Secondary | ICD-10-CM | POA: Diagnosis not present

## 2022-09-13 DIAGNOSIS — M1612 Unilateral primary osteoarthritis, left hip: Secondary | ICD-10-CM | POA: Diagnosis not present

## 2022-09-13 DIAGNOSIS — N183 Chronic kidney disease, stage 3 unspecified: Secondary | ICD-10-CM | POA: Diagnosis not present

## 2022-09-13 DIAGNOSIS — Z96641 Presence of right artificial hip joint: Secondary | ICD-10-CM | POA: Diagnosis not present

## 2022-09-13 DIAGNOSIS — G309 Alzheimer's disease, unspecified: Secondary | ICD-10-CM | POA: Diagnosis not present

## 2022-09-13 DIAGNOSIS — I129 Hypertensive chronic kidney disease with stage 1 through stage 4 chronic kidney disease, or unspecified chronic kidney disease: Secondary | ICD-10-CM | POA: Diagnosis not present

## 2022-09-14 DIAGNOSIS — E785 Hyperlipidemia, unspecified: Secondary | ICD-10-CM | POA: Diagnosis not present

## 2022-09-14 DIAGNOSIS — Z7982 Long term (current) use of aspirin: Secondary | ICD-10-CM | POA: Diagnosis not present

## 2022-09-14 DIAGNOSIS — E039 Hypothyroidism, unspecified: Secondary | ICD-10-CM | POA: Diagnosis not present

## 2022-09-14 DIAGNOSIS — I714 Abdominal aortic aneurysm, without rupture, unspecified: Secondary | ICD-10-CM | POA: Diagnosis not present

## 2022-09-14 DIAGNOSIS — G43909 Migraine, unspecified, not intractable, without status migrainosus: Secondary | ICD-10-CM | POA: Diagnosis not present

## 2022-09-14 DIAGNOSIS — D649 Anemia, unspecified: Secondary | ICD-10-CM | POA: Diagnosis not present

## 2022-09-14 DIAGNOSIS — F0283 Dementia in other diseases classified elsewhere, unspecified severity, with mood disturbance: Secondary | ICD-10-CM | POA: Diagnosis not present

## 2022-09-14 DIAGNOSIS — Z96641 Presence of right artificial hip joint: Secondary | ICD-10-CM | POA: Diagnosis not present

## 2022-09-14 DIAGNOSIS — M792 Neuralgia and neuritis, unspecified: Secondary | ICD-10-CM | POA: Diagnosis not present

## 2022-09-14 DIAGNOSIS — N183 Chronic kidney disease, stage 3 unspecified: Secondary | ICD-10-CM | POA: Diagnosis not present

## 2022-09-14 DIAGNOSIS — Z952 Presence of prosthetic heart valve: Secondary | ICD-10-CM | POA: Diagnosis not present

## 2022-09-14 DIAGNOSIS — Z951 Presence of aortocoronary bypass graft: Secondary | ICD-10-CM | POA: Diagnosis not present

## 2022-09-14 DIAGNOSIS — I129 Hypertensive chronic kidney disease with stage 1 through stage 4 chronic kidney disease, or unspecified chronic kidney disease: Secondary | ICD-10-CM | POA: Diagnosis not present

## 2022-09-14 DIAGNOSIS — Z950 Presence of cardiac pacemaker: Secondary | ICD-10-CM | POA: Diagnosis not present

## 2022-09-14 DIAGNOSIS — G309 Alzheimer's disease, unspecified: Secondary | ICD-10-CM | POA: Diagnosis not present

## 2022-09-14 DIAGNOSIS — M810 Age-related osteoporosis without current pathological fracture: Secondary | ICD-10-CM | POA: Diagnosis not present

## 2022-09-14 DIAGNOSIS — I2510 Atherosclerotic heart disease of native coronary artery without angina pectoris: Secondary | ICD-10-CM | POA: Diagnosis not present

## 2022-09-14 DIAGNOSIS — M1612 Unilateral primary osteoarthritis, left hip: Secondary | ICD-10-CM | POA: Diagnosis not present

## 2022-09-14 DIAGNOSIS — I441 Atrioventricular block, second degree: Secondary | ICD-10-CM | POA: Diagnosis not present

## 2022-09-14 DIAGNOSIS — J4530 Mild persistent asthma, uncomplicated: Secondary | ICD-10-CM | POA: Diagnosis not present

## 2022-09-14 DIAGNOSIS — Z79899 Other long term (current) drug therapy: Secondary | ICD-10-CM | POA: Diagnosis not present

## 2022-09-14 DIAGNOSIS — I447 Left bundle-branch block, unspecified: Secondary | ICD-10-CM | POA: Diagnosis not present

## 2022-09-14 DIAGNOSIS — R350 Frequency of micturition: Secondary | ICD-10-CM | POA: Diagnosis not present

## 2022-09-14 NOTE — Telephone Encounter (Signed)
Approved.  

## 2022-09-14 NOTE — Telephone Encounter (Signed)
Home Health verbal orders Caller Name:Connie Agency Name: [facility]  Callback number: [phone]  Requesting OT/PT/Skilled nursing/Social Work/Speech:OT  Reason:  Frequency:1 x W  for 6 W  Please forward to [employer] pool or providers CMA

## 2022-09-14 NOTE — Telephone Encounter (Signed)
Called and spoke with Junious Dresser regarding approval of verbal orders for OT. Will call back if she has any further questions.

## 2022-09-15 DIAGNOSIS — Z79899 Other long term (current) drug therapy: Secondary | ICD-10-CM | POA: Diagnosis not present

## 2022-09-15 DIAGNOSIS — E785 Hyperlipidemia, unspecified: Secondary | ICD-10-CM | POA: Diagnosis not present

## 2022-09-15 DIAGNOSIS — J4530 Mild persistent asthma, uncomplicated: Secondary | ICD-10-CM | POA: Diagnosis not present

## 2022-09-15 DIAGNOSIS — G43909 Migraine, unspecified, not intractable, without status migrainosus: Secondary | ICD-10-CM | POA: Diagnosis not present

## 2022-09-15 DIAGNOSIS — I447 Left bundle-branch block, unspecified: Secondary | ICD-10-CM | POA: Diagnosis not present

## 2022-09-15 DIAGNOSIS — R350 Frequency of micturition: Secondary | ICD-10-CM | POA: Diagnosis not present

## 2022-09-15 DIAGNOSIS — Z950 Presence of cardiac pacemaker: Secondary | ICD-10-CM | POA: Diagnosis not present

## 2022-09-15 DIAGNOSIS — E039 Hypothyroidism, unspecified: Secondary | ICD-10-CM | POA: Diagnosis not present

## 2022-09-15 DIAGNOSIS — M792 Neuralgia and neuritis, unspecified: Secondary | ICD-10-CM | POA: Diagnosis not present

## 2022-09-15 DIAGNOSIS — I714 Abdominal aortic aneurysm, without rupture, unspecified: Secondary | ICD-10-CM | POA: Diagnosis not present

## 2022-09-15 DIAGNOSIS — I441 Atrioventricular block, second degree: Secondary | ICD-10-CM | POA: Diagnosis not present

## 2022-09-15 DIAGNOSIS — M1612 Unilateral primary osteoarthritis, left hip: Secondary | ICD-10-CM | POA: Diagnosis not present

## 2022-09-15 DIAGNOSIS — M810 Age-related osteoporosis without current pathological fracture: Secondary | ICD-10-CM | POA: Diagnosis not present

## 2022-09-15 DIAGNOSIS — N183 Chronic kidney disease, stage 3 unspecified: Secondary | ICD-10-CM | POA: Diagnosis not present

## 2022-09-15 DIAGNOSIS — F0283 Dementia in other diseases classified elsewhere, unspecified severity, with mood disturbance: Secondary | ICD-10-CM | POA: Diagnosis not present

## 2022-09-15 DIAGNOSIS — Z951 Presence of aortocoronary bypass graft: Secondary | ICD-10-CM | POA: Diagnosis not present

## 2022-09-15 DIAGNOSIS — Z96641 Presence of right artificial hip joint: Secondary | ICD-10-CM | POA: Diagnosis not present

## 2022-09-15 DIAGNOSIS — Z7982 Long term (current) use of aspirin: Secondary | ICD-10-CM | POA: Diagnosis not present

## 2022-09-15 DIAGNOSIS — Z952 Presence of prosthetic heart valve: Secondary | ICD-10-CM | POA: Diagnosis not present

## 2022-09-15 DIAGNOSIS — I2510 Atherosclerotic heart disease of native coronary artery without angina pectoris: Secondary | ICD-10-CM | POA: Diagnosis not present

## 2022-09-15 DIAGNOSIS — G309 Alzheimer's disease, unspecified: Secondary | ICD-10-CM | POA: Diagnosis not present

## 2022-09-15 DIAGNOSIS — D649 Anemia, unspecified: Secondary | ICD-10-CM | POA: Diagnosis not present

## 2022-09-15 DIAGNOSIS — I129 Hypertensive chronic kidney disease with stage 1 through stage 4 chronic kidney disease, or unspecified chronic kidney disease: Secondary | ICD-10-CM | POA: Diagnosis not present

## 2022-09-18 ENCOUNTER — Other Ambulatory Visit: Payer: Self-pay | Admitting: Primary Care

## 2022-09-18 DIAGNOSIS — I2510 Atherosclerotic heart disease of native coronary artery without angina pectoris: Secondary | ICD-10-CM

## 2022-09-18 DIAGNOSIS — J4530 Mild persistent asthma, uncomplicated: Secondary | ICD-10-CM

## 2022-09-21 DIAGNOSIS — M810 Age-related osteoporosis without current pathological fracture: Secondary | ICD-10-CM | POA: Diagnosis not present

## 2022-09-21 DIAGNOSIS — Z7982 Long term (current) use of aspirin: Secondary | ICD-10-CM | POA: Diagnosis not present

## 2022-09-21 DIAGNOSIS — Z951 Presence of aortocoronary bypass graft: Secondary | ICD-10-CM | POA: Diagnosis not present

## 2022-09-21 DIAGNOSIS — I447 Left bundle-branch block, unspecified: Secondary | ICD-10-CM | POA: Diagnosis not present

## 2022-09-21 DIAGNOSIS — N183 Chronic kidney disease, stage 3 unspecified: Secondary | ICD-10-CM | POA: Diagnosis not present

## 2022-09-21 DIAGNOSIS — J4530 Mild persistent asthma, uncomplicated: Secondary | ICD-10-CM | POA: Diagnosis not present

## 2022-09-21 DIAGNOSIS — Z96641 Presence of right artificial hip joint: Secondary | ICD-10-CM | POA: Diagnosis not present

## 2022-09-21 DIAGNOSIS — I129 Hypertensive chronic kidney disease with stage 1 through stage 4 chronic kidney disease, or unspecified chronic kidney disease: Secondary | ICD-10-CM | POA: Diagnosis not present

## 2022-09-21 DIAGNOSIS — I2510 Atherosclerotic heart disease of native coronary artery without angina pectoris: Secondary | ICD-10-CM | POA: Diagnosis not present

## 2022-09-21 DIAGNOSIS — F0283 Dementia in other diseases classified elsewhere, unspecified severity, with mood disturbance: Secondary | ICD-10-CM | POA: Diagnosis not present

## 2022-09-21 DIAGNOSIS — Z79899 Other long term (current) drug therapy: Secondary | ICD-10-CM | POA: Diagnosis not present

## 2022-09-21 DIAGNOSIS — M1612 Unilateral primary osteoarthritis, left hip: Secondary | ICD-10-CM | POA: Diagnosis not present

## 2022-09-21 DIAGNOSIS — I714 Abdominal aortic aneurysm, without rupture, unspecified: Secondary | ICD-10-CM | POA: Diagnosis not present

## 2022-09-21 DIAGNOSIS — M792 Neuralgia and neuritis, unspecified: Secondary | ICD-10-CM | POA: Diagnosis not present

## 2022-09-21 DIAGNOSIS — E785 Hyperlipidemia, unspecified: Secondary | ICD-10-CM | POA: Diagnosis not present

## 2022-09-21 DIAGNOSIS — G309 Alzheimer's disease, unspecified: Secondary | ICD-10-CM | POA: Diagnosis not present

## 2022-09-21 DIAGNOSIS — G43909 Migraine, unspecified, not intractable, without status migrainosus: Secondary | ICD-10-CM | POA: Diagnosis not present

## 2022-09-21 DIAGNOSIS — Z950 Presence of cardiac pacemaker: Secondary | ICD-10-CM | POA: Diagnosis not present

## 2022-09-21 DIAGNOSIS — D649 Anemia, unspecified: Secondary | ICD-10-CM | POA: Diagnosis not present

## 2022-09-21 DIAGNOSIS — R350 Frequency of micturition: Secondary | ICD-10-CM | POA: Diagnosis not present

## 2022-09-21 DIAGNOSIS — E039 Hypothyroidism, unspecified: Secondary | ICD-10-CM | POA: Diagnosis not present

## 2022-09-21 DIAGNOSIS — Z952 Presence of prosthetic heart valve: Secondary | ICD-10-CM | POA: Diagnosis not present

## 2022-09-21 DIAGNOSIS — I441 Atrioventricular block, second degree: Secondary | ICD-10-CM | POA: Diagnosis not present

## 2022-09-23 DIAGNOSIS — D649 Anemia, unspecified: Secondary | ICD-10-CM | POA: Diagnosis not present

## 2022-09-23 DIAGNOSIS — Z96641 Presence of right artificial hip joint: Secondary | ICD-10-CM | POA: Diagnosis not present

## 2022-09-23 DIAGNOSIS — I441 Atrioventricular block, second degree: Secondary | ICD-10-CM | POA: Diagnosis not present

## 2022-09-23 DIAGNOSIS — M1612 Unilateral primary osteoarthritis, left hip: Secondary | ICD-10-CM | POA: Diagnosis not present

## 2022-09-23 DIAGNOSIS — N183 Chronic kidney disease, stage 3 unspecified: Secondary | ICD-10-CM | POA: Diagnosis not present

## 2022-09-23 DIAGNOSIS — R350 Frequency of micturition: Secondary | ICD-10-CM | POA: Diagnosis not present

## 2022-09-23 DIAGNOSIS — Z950 Presence of cardiac pacemaker: Secondary | ICD-10-CM | POA: Diagnosis not present

## 2022-09-23 DIAGNOSIS — G43909 Migraine, unspecified, not intractable, without status migrainosus: Secondary | ICD-10-CM | POA: Diagnosis not present

## 2022-09-23 DIAGNOSIS — E785 Hyperlipidemia, unspecified: Secondary | ICD-10-CM | POA: Diagnosis not present

## 2022-09-23 DIAGNOSIS — I2510 Atherosclerotic heart disease of native coronary artery without angina pectoris: Secondary | ICD-10-CM | POA: Diagnosis not present

## 2022-09-23 DIAGNOSIS — G309 Alzheimer's disease, unspecified: Secondary | ICD-10-CM | POA: Diagnosis not present

## 2022-09-23 DIAGNOSIS — M810 Age-related osteoporosis without current pathological fracture: Secondary | ICD-10-CM | POA: Diagnosis not present

## 2022-09-23 DIAGNOSIS — J4530 Mild persistent asthma, uncomplicated: Secondary | ICD-10-CM | POA: Diagnosis not present

## 2022-09-23 DIAGNOSIS — E039 Hypothyroidism, unspecified: Secondary | ICD-10-CM | POA: Diagnosis not present

## 2022-09-23 DIAGNOSIS — M792 Neuralgia and neuritis, unspecified: Secondary | ICD-10-CM | POA: Diagnosis not present

## 2022-09-23 DIAGNOSIS — I714 Abdominal aortic aneurysm, without rupture, unspecified: Secondary | ICD-10-CM | POA: Diagnosis not present

## 2022-09-23 DIAGNOSIS — Z79899 Other long term (current) drug therapy: Secondary | ICD-10-CM | POA: Diagnosis not present

## 2022-09-23 DIAGNOSIS — Z951 Presence of aortocoronary bypass graft: Secondary | ICD-10-CM | POA: Diagnosis not present

## 2022-09-23 DIAGNOSIS — I447 Left bundle-branch block, unspecified: Secondary | ICD-10-CM | POA: Diagnosis not present

## 2022-09-23 DIAGNOSIS — F0283 Dementia in other diseases classified elsewhere, unspecified severity, with mood disturbance: Secondary | ICD-10-CM | POA: Diagnosis not present

## 2022-09-23 DIAGNOSIS — Z7982 Long term (current) use of aspirin: Secondary | ICD-10-CM | POA: Diagnosis not present

## 2022-09-23 DIAGNOSIS — Z952 Presence of prosthetic heart valve: Secondary | ICD-10-CM | POA: Diagnosis not present

## 2022-09-23 DIAGNOSIS — I129 Hypertensive chronic kidney disease with stage 1 through stage 4 chronic kidney disease, or unspecified chronic kidney disease: Secondary | ICD-10-CM | POA: Diagnosis not present

## 2022-09-27 ENCOUNTER — Telehealth: Payer: Self-pay | Admitting: Primary Care

## 2022-09-27 DIAGNOSIS — M1612 Unilateral primary osteoarthritis, left hip: Secondary | ICD-10-CM | POA: Diagnosis not present

## 2022-09-27 DIAGNOSIS — J4530 Mild persistent asthma, uncomplicated: Secondary | ICD-10-CM | POA: Diagnosis not present

## 2022-09-27 DIAGNOSIS — I447 Left bundle-branch block, unspecified: Secondary | ICD-10-CM | POA: Diagnosis not present

## 2022-09-27 DIAGNOSIS — Z96641 Presence of right artificial hip joint: Secondary | ICD-10-CM | POA: Diagnosis not present

## 2022-09-27 DIAGNOSIS — Z951 Presence of aortocoronary bypass graft: Secondary | ICD-10-CM | POA: Diagnosis not present

## 2022-09-27 DIAGNOSIS — N183 Chronic kidney disease, stage 3 unspecified: Secondary | ICD-10-CM | POA: Diagnosis not present

## 2022-09-27 DIAGNOSIS — Z952 Presence of prosthetic heart valve: Secondary | ICD-10-CM | POA: Diagnosis not present

## 2022-09-27 DIAGNOSIS — Z79899 Other long term (current) drug therapy: Secondary | ICD-10-CM | POA: Diagnosis not present

## 2022-09-27 DIAGNOSIS — Z950 Presence of cardiac pacemaker: Secondary | ICD-10-CM | POA: Diagnosis not present

## 2022-09-27 DIAGNOSIS — G309 Alzheimer's disease, unspecified: Secondary | ICD-10-CM | POA: Diagnosis not present

## 2022-09-27 DIAGNOSIS — I441 Atrioventricular block, second degree: Secondary | ICD-10-CM | POA: Diagnosis not present

## 2022-09-27 DIAGNOSIS — D649 Anemia, unspecified: Secondary | ICD-10-CM | POA: Diagnosis not present

## 2022-09-27 DIAGNOSIS — R350 Frequency of micturition: Secondary | ICD-10-CM | POA: Diagnosis not present

## 2022-09-27 DIAGNOSIS — E785 Hyperlipidemia, unspecified: Secondary | ICD-10-CM | POA: Diagnosis not present

## 2022-09-27 DIAGNOSIS — Z7982 Long term (current) use of aspirin: Secondary | ICD-10-CM | POA: Diagnosis not present

## 2022-09-27 DIAGNOSIS — I2510 Atherosclerotic heart disease of native coronary artery without angina pectoris: Secondary | ICD-10-CM | POA: Diagnosis not present

## 2022-09-27 DIAGNOSIS — E039 Hypothyroidism, unspecified: Secondary | ICD-10-CM | POA: Diagnosis not present

## 2022-09-27 DIAGNOSIS — M810 Age-related osteoporosis without current pathological fracture: Secondary | ICD-10-CM | POA: Diagnosis not present

## 2022-09-27 DIAGNOSIS — G43909 Migraine, unspecified, not intractable, without status migrainosus: Secondary | ICD-10-CM | POA: Diagnosis not present

## 2022-09-27 DIAGNOSIS — M792 Neuralgia and neuritis, unspecified: Secondary | ICD-10-CM | POA: Diagnosis not present

## 2022-09-27 DIAGNOSIS — I129 Hypertensive chronic kidney disease with stage 1 through stage 4 chronic kidney disease, or unspecified chronic kidney disease: Secondary | ICD-10-CM | POA: Diagnosis not present

## 2022-09-27 DIAGNOSIS — F0283 Dementia in other diseases classified elsewhere, unspecified severity, with mood disturbance: Secondary | ICD-10-CM | POA: Diagnosis not present

## 2022-09-27 DIAGNOSIS — I714 Abdominal aortic aneurysm, without rupture, unspecified: Secondary | ICD-10-CM | POA: Diagnosis not present

## 2022-09-27 NOTE — Telephone Encounter (Signed)
Form is placed in [place] box for review and completion

## 2022-09-27 NOTE — Telephone Encounter (Signed)
Type of forms received:Parking Placard  Routed SE:LTRVU Pool  Paperwork received by : Lesly Rubenstein   Individual made aware of 3-5 business day turn around (Y/N): Y  Form completed and patient made aware of charges(Y/N): Y   Faxed to :   Form location: place in PCP [facility]

## 2022-09-28 DIAGNOSIS — F0283 Dementia in other diseases classified elsewhere, unspecified severity, with mood disturbance: Secondary | ICD-10-CM | POA: Diagnosis not present

## 2022-09-28 DIAGNOSIS — E039 Hypothyroidism, unspecified: Secondary | ICD-10-CM | POA: Diagnosis not present

## 2022-09-28 DIAGNOSIS — G43909 Migraine, unspecified, not intractable, without status migrainosus: Secondary | ICD-10-CM | POA: Diagnosis not present

## 2022-09-28 DIAGNOSIS — I447 Left bundle-branch block, unspecified: Secondary | ICD-10-CM | POA: Diagnosis not present

## 2022-09-28 DIAGNOSIS — Z7982 Long term (current) use of aspirin: Secondary | ICD-10-CM | POA: Diagnosis not present

## 2022-09-28 DIAGNOSIS — E785 Hyperlipidemia, unspecified: Secondary | ICD-10-CM | POA: Diagnosis not present

## 2022-09-28 DIAGNOSIS — J4530 Mild persistent asthma, uncomplicated: Secondary | ICD-10-CM | POA: Diagnosis not present

## 2022-09-28 DIAGNOSIS — I441 Atrioventricular block, second degree: Secondary | ICD-10-CM | POA: Diagnosis not present

## 2022-09-28 DIAGNOSIS — I2510 Atherosclerotic heart disease of native coronary artery without angina pectoris: Secondary | ICD-10-CM | POA: Diagnosis not present

## 2022-09-28 DIAGNOSIS — D649 Anemia, unspecified: Secondary | ICD-10-CM | POA: Diagnosis not present

## 2022-09-28 DIAGNOSIS — I714 Abdominal aortic aneurysm, without rupture, unspecified: Secondary | ICD-10-CM | POA: Diagnosis not present

## 2022-09-28 DIAGNOSIS — Z96641 Presence of right artificial hip joint: Secondary | ICD-10-CM | POA: Diagnosis not present

## 2022-09-28 DIAGNOSIS — M1612 Unilateral primary osteoarthritis, left hip: Secondary | ICD-10-CM | POA: Diagnosis not present

## 2022-09-28 DIAGNOSIS — Z950 Presence of cardiac pacemaker: Secondary | ICD-10-CM | POA: Diagnosis not present

## 2022-09-28 DIAGNOSIS — N183 Chronic kidney disease, stage 3 unspecified: Secondary | ICD-10-CM | POA: Diagnosis not present

## 2022-09-28 DIAGNOSIS — I129 Hypertensive chronic kidney disease with stage 1 through stage 4 chronic kidney disease, or unspecified chronic kidney disease: Secondary | ICD-10-CM | POA: Diagnosis not present

## 2022-09-28 DIAGNOSIS — Z79899 Other long term (current) drug therapy: Secondary | ICD-10-CM | POA: Diagnosis not present

## 2022-09-28 DIAGNOSIS — R350 Frequency of micturition: Secondary | ICD-10-CM | POA: Diagnosis not present

## 2022-09-28 DIAGNOSIS — Z951 Presence of aortocoronary bypass graft: Secondary | ICD-10-CM | POA: Diagnosis not present

## 2022-09-28 DIAGNOSIS — G309 Alzheimer's disease, unspecified: Secondary | ICD-10-CM | POA: Diagnosis not present

## 2022-09-28 DIAGNOSIS — Z952 Presence of prosthetic heart valve: Secondary | ICD-10-CM | POA: Diagnosis not present

## 2022-09-28 DIAGNOSIS — M792 Neuralgia and neuritis, unspecified: Secondary | ICD-10-CM | POA: Diagnosis not present

## 2022-09-28 DIAGNOSIS — M810 Age-related osteoporosis without current pathological fracture: Secondary | ICD-10-CM | POA: Diagnosis not present

## 2022-09-28 NOTE — Telephone Encounter (Signed)
Completed and placed in Kelli's inbox. 

## 2022-09-28 NOTE — Telephone Encounter (Signed)
Patients daughter notified that placard is ready for pickup with reception.

## 2022-09-30 ENCOUNTER — Telehealth: Payer: Self-pay

## 2022-09-30 DIAGNOSIS — M810 Age-related osteoporosis without current pathological fracture: Secondary | ICD-10-CM

## 2022-09-30 MED ORDER — CALTRATE 600+D PLUS MINERALS 600-800 MG-UNIT PO TABS: ORAL_TABLET | ORAL | 3 refills | Status: DC

## 2022-09-30 NOTE — Telephone Encounter (Signed)
  [facility] requesting a refill on Caltrate 600 +D plus mineral tablet.  Per chart review, this is not listed on medication list.  Do I need to advise they get this OTC?

## 2022-09-30 NOTE — Addendum Note (Signed)
Addended by: Doreene Nest on: 09/30/2022 04:17 PM   Modules accepted: Orders

## 2022-09-30 NOTE — Telephone Encounter (Signed)
Please contact daughter and ask her if she gets this over-the-counter.  I could not see record of where this was actually prescribed.

## 2022-09-30 NOTE — Telephone Encounter (Signed)
Contacted daughter she did not know if her mother was taking this or not.  Called [facility] they verified that the patient has been taking this medication since 02/2022 and that they require all medications to be sent in as prescriptions to the pharmacy. She needs this sent to [facility].

## 2022-09-30 NOTE — Telephone Encounter (Signed)
Noted.  Prescription sent to pharmacy. 

## 2022-10-06 DIAGNOSIS — R350 Frequency of micturition: Secondary | ICD-10-CM | POA: Diagnosis not present

## 2022-10-06 DIAGNOSIS — E785 Hyperlipidemia, unspecified: Secondary | ICD-10-CM | POA: Diagnosis not present

## 2022-10-06 DIAGNOSIS — M792 Neuralgia and neuritis, unspecified: Secondary | ICD-10-CM | POA: Diagnosis not present

## 2022-10-06 DIAGNOSIS — I714 Abdominal aortic aneurysm, without rupture, unspecified: Secondary | ICD-10-CM | POA: Diagnosis not present

## 2022-10-06 DIAGNOSIS — M1612 Unilateral primary osteoarthritis, left hip: Secondary | ICD-10-CM | POA: Diagnosis not present

## 2022-10-06 DIAGNOSIS — J4530 Mild persistent asthma, uncomplicated: Secondary | ICD-10-CM | POA: Diagnosis not present

## 2022-10-06 DIAGNOSIS — Z951 Presence of aortocoronary bypass graft: Secondary | ICD-10-CM | POA: Diagnosis not present

## 2022-10-06 DIAGNOSIS — G43909 Migraine, unspecified, not intractable, without status migrainosus: Secondary | ICD-10-CM | POA: Diagnosis not present

## 2022-10-06 DIAGNOSIS — F0283 Dementia in other diseases classified elsewhere, unspecified severity, with mood disturbance: Secondary | ICD-10-CM | POA: Diagnosis not present

## 2022-10-06 DIAGNOSIS — I2510 Atherosclerotic heart disease of native coronary artery without angina pectoris: Secondary | ICD-10-CM | POA: Diagnosis not present

## 2022-10-06 DIAGNOSIS — I447 Left bundle-branch block, unspecified: Secondary | ICD-10-CM | POA: Diagnosis not present

## 2022-10-06 DIAGNOSIS — E039 Hypothyroidism, unspecified: Secondary | ICD-10-CM | POA: Diagnosis not present

## 2022-10-06 DIAGNOSIS — Z952 Presence of prosthetic heart valve: Secondary | ICD-10-CM | POA: Diagnosis not present

## 2022-10-06 DIAGNOSIS — Z7982 Long term (current) use of aspirin: Secondary | ICD-10-CM | POA: Diagnosis not present

## 2022-10-06 DIAGNOSIS — N183 Chronic kidney disease, stage 3 unspecified: Secondary | ICD-10-CM | POA: Diagnosis not present

## 2022-10-06 DIAGNOSIS — Z79899 Other long term (current) drug therapy: Secondary | ICD-10-CM | POA: Diagnosis not present

## 2022-10-06 DIAGNOSIS — Z950 Presence of cardiac pacemaker: Secondary | ICD-10-CM | POA: Diagnosis not present

## 2022-10-06 DIAGNOSIS — M810 Age-related osteoporosis without current pathological fracture: Secondary | ICD-10-CM | POA: Diagnosis not present

## 2022-10-06 DIAGNOSIS — I441 Atrioventricular block, second degree: Secondary | ICD-10-CM | POA: Diagnosis not present

## 2022-10-06 DIAGNOSIS — G309 Alzheimer's disease, unspecified: Secondary | ICD-10-CM | POA: Diagnosis not present

## 2022-10-06 DIAGNOSIS — Z96641 Presence of right artificial hip joint: Secondary | ICD-10-CM | POA: Diagnosis not present

## 2022-10-06 DIAGNOSIS — I129 Hypertensive chronic kidney disease with stage 1 through stage 4 chronic kidney disease, or unspecified chronic kidney disease: Secondary | ICD-10-CM | POA: Diagnosis not present

## 2022-10-06 DIAGNOSIS — D649 Anemia, unspecified: Secondary | ICD-10-CM | POA: Diagnosis not present

## 2022-10-11 DIAGNOSIS — M792 Neuralgia and neuritis, unspecified: Secondary | ICD-10-CM | POA: Diagnosis not present

## 2022-10-11 DIAGNOSIS — Z7982 Long term (current) use of aspirin: Secondary | ICD-10-CM | POA: Diagnosis not present

## 2022-10-11 DIAGNOSIS — E785 Hyperlipidemia, unspecified: Secondary | ICD-10-CM | POA: Diagnosis not present

## 2022-10-11 DIAGNOSIS — I447 Left bundle-branch block, unspecified: Secondary | ICD-10-CM | POA: Diagnosis not present

## 2022-10-11 DIAGNOSIS — G43909 Migraine, unspecified, not intractable, without status migrainosus: Secondary | ICD-10-CM | POA: Diagnosis not present

## 2022-10-11 DIAGNOSIS — Z96641 Presence of right artificial hip joint: Secondary | ICD-10-CM | POA: Diagnosis not present

## 2022-10-11 DIAGNOSIS — M1612 Unilateral primary osteoarthritis, left hip: Secondary | ICD-10-CM | POA: Diagnosis not present

## 2022-10-11 DIAGNOSIS — R350 Frequency of micturition: Secondary | ICD-10-CM | POA: Diagnosis not present

## 2022-10-11 DIAGNOSIS — I714 Abdominal aortic aneurysm, without rupture, unspecified: Secondary | ICD-10-CM | POA: Diagnosis not present

## 2022-10-11 DIAGNOSIS — N183 Chronic kidney disease, stage 3 unspecified: Secondary | ICD-10-CM | POA: Diagnosis not present

## 2022-10-11 DIAGNOSIS — F0283 Dementia in other diseases classified elsewhere, unspecified severity, with mood disturbance: Secondary | ICD-10-CM | POA: Diagnosis not present

## 2022-10-11 DIAGNOSIS — E039 Hypothyroidism, unspecified: Secondary | ICD-10-CM | POA: Diagnosis not present

## 2022-10-11 DIAGNOSIS — I129 Hypertensive chronic kidney disease with stage 1 through stage 4 chronic kidney disease, or unspecified chronic kidney disease: Secondary | ICD-10-CM | POA: Diagnosis not present

## 2022-10-11 DIAGNOSIS — I441 Atrioventricular block, second degree: Secondary | ICD-10-CM | POA: Diagnosis not present

## 2022-10-11 DIAGNOSIS — I2510 Atherosclerotic heart disease of native coronary artery without angina pectoris: Secondary | ICD-10-CM | POA: Diagnosis not present

## 2022-10-11 DIAGNOSIS — J4530 Mild persistent asthma, uncomplicated: Secondary | ICD-10-CM | POA: Diagnosis not present

## 2022-10-11 DIAGNOSIS — Z79899 Other long term (current) drug therapy: Secondary | ICD-10-CM | POA: Diagnosis not present

## 2022-10-11 DIAGNOSIS — M810 Age-related osteoporosis without current pathological fracture: Secondary | ICD-10-CM | POA: Diagnosis not present

## 2022-10-11 DIAGNOSIS — D649 Anemia, unspecified: Secondary | ICD-10-CM | POA: Diagnosis not present

## 2022-10-11 DIAGNOSIS — Z950 Presence of cardiac pacemaker: Secondary | ICD-10-CM | POA: Diagnosis not present

## 2022-10-11 DIAGNOSIS — Z952 Presence of prosthetic heart valve: Secondary | ICD-10-CM | POA: Diagnosis not present

## 2022-10-11 DIAGNOSIS — Z951 Presence of aortocoronary bypass graft: Secondary | ICD-10-CM | POA: Diagnosis not present

## 2022-10-11 DIAGNOSIS — G309 Alzheimer's disease, unspecified: Secondary | ICD-10-CM | POA: Diagnosis not present

## 2022-10-12 DIAGNOSIS — M810 Age-related osteoporosis without current pathological fracture: Secondary | ICD-10-CM | POA: Diagnosis not present

## 2022-10-12 DIAGNOSIS — G43909 Migraine, unspecified, not intractable, without status migrainosus: Secondary | ICD-10-CM | POA: Diagnosis not present

## 2022-10-12 DIAGNOSIS — N183 Chronic kidney disease, stage 3 unspecified: Secondary | ICD-10-CM | POA: Diagnosis not present

## 2022-10-12 DIAGNOSIS — E785 Hyperlipidemia, unspecified: Secondary | ICD-10-CM | POA: Diagnosis not present

## 2022-10-12 DIAGNOSIS — I441 Atrioventricular block, second degree: Secondary | ICD-10-CM | POA: Diagnosis not present

## 2022-10-12 DIAGNOSIS — I2510 Atherosclerotic heart disease of native coronary artery without angina pectoris: Secondary | ICD-10-CM | POA: Diagnosis not present

## 2022-10-12 DIAGNOSIS — E039 Hypothyroidism, unspecified: Secondary | ICD-10-CM | POA: Diagnosis not present

## 2022-10-12 DIAGNOSIS — R350 Frequency of micturition: Secondary | ICD-10-CM | POA: Diagnosis not present

## 2022-10-12 DIAGNOSIS — Z952 Presence of prosthetic heart valve: Secondary | ICD-10-CM | POA: Diagnosis not present

## 2022-10-12 DIAGNOSIS — J4530 Mild persistent asthma, uncomplicated: Secondary | ICD-10-CM | POA: Diagnosis not present

## 2022-10-12 DIAGNOSIS — Z7982 Long term (current) use of aspirin: Secondary | ICD-10-CM | POA: Diagnosis not present

## 2022-10-12 DIAGNOSIS — F0283 Dementia in other diseases classified elsewhere, unspecified severity, with mood disturbance: Secondary | ICD-10-CM | POA: Diagnosis not present

## 2022-10-12 DIAGNOSIS — Z951 Presence of aortocoronary bypass graft: Secondary | ICD-10-CM | POA: Diagnosis not present

## 2022-10-12 DIAGNOSIS — I447 Left bundle-branch block, unspecified: Secondary | ICD-10-CM | POA: Diagnosis not present

## 2022-10-12 DIAGNOSIS — Z96641 Presence of right artificial hip joint: Secondary | ICD-10-CM | POA: Diagnosis not present

## 2022-10-12 DIAGNOSIS — I714 Abdominal aortic aneurysm, without rupture, unspecified: Secondary | ICD-10-CM | POA: Diagnosis not present

## 2022-10-12 DIAGNOSIS — Z950 Presence of cardiac pacemaker: Secondary | ICD-10-CM | POA: Diagnosis not present

## 2022-10-12 DIAGNOSIS — M792 Neuralgia and neuritis, unspecified: Secondary | ICD-10-CM | POA: Diagnosis not present

## 2022-10-12 DIAGNOSIS — I129 Hypertensive chronic kidney disease with stage 1 through stage 4 chronic kidney disease, or unspecified chronic kidney disease: Secondary | ICD-10-CM | POA: Diagnosis not present

## 2022-10-12 DIAGNOSIS — G309 Alzheimer's disease, unspecified: Secondary | ICD-10-CM | POA: Diagnosis not present

## 2022-10-12 DIAGNOSIS — Z79899 Other long term (current) drug therapy: Secondary | ICD-10-CM | POA: Diagnosis not present

## 2022-10-12 DIAGNOSIS — D649 Anemia, unspecified: Secondary | ICD-10-CM | POA: Diagnosis not present

## 2022-10-12 DIAGNOSIS — M1612 Unilateral primary osteoarthritis, left hip: Secondary | ICD-10-CM | POA: Diagnosis not present

## 2022-10-13 ENCOUNTER — Other Ambulatory Visit: Payer: Self-pay | Admitting: Cardiovascular Disease

## 2022-10-13 ENCOUNTER — Other Ambulatory Visit: Payer: Self-pay | Admitting: Primary Care

## 2022-10-13 DIAGNOSIS — I10 Essential (primary) hypertension: Secondary | ICD-10-CM

## 2022-10-19 ENCOUNTER — Telehealth: Payer: Self-pay | Admitting: Primary Care

## 2022-10-19 DIAGNOSIS — I129 Hypertensive chronic kidney disease with stage 1 through stage 4 chronic kidney disease, or unspecified chronic kidney disease: Secondary | ICD-10-CM | POA: Diagnosis not present

## 2022-10-19 DIAGNOSIS — I441 Atrioventricular block, second degree: Secondary | ICD-10-CM | POA: Diagnosis not present

## 2022-10-19 DIAGNOSIS — Z952 Presence of prosthetic heart valve: Secondary | ICD-10-CM | POA: Diagnosis not present

## 2022-10-19 DIAGNOSIS — M810 Age-related osteoporosis without current pathological fracture: Secondary | ICD-10-CM | POA: Diagnosis not present

## 2022-10-19 DIAGNOSIS — Z950 Presence of cardiac pacemaker: Secondary | ICD-10-CM | POA: Diagnosis not present

## 2022-10-19 DIAGNOSIS — M1612 Unilateral primary osteoarthritis, left hip: Secondary | ICD-10-CM | POA: Diagnosis not present

## 2022-10-19 DIAGNOSIS — I714 Abdominal aortic aneurysm, without rupture, unspecified: Secondary | ICD-10-CM | POA: Diagnosis not present

## 2022-10-19 DIAGNOSIS — Z79899 Other long term (current) drug therapy: Secondary | ICD-10-CM | POA: Diagnosis not present

## 2022-10-19 DIAGNOSIS — G43909 Migraine, unspecified, not intractable, without status migrainosus: Secondary | ICD-10-CM | POA: Diagnosis not present

## 2022-10-19 DIAGNOSIS — Z951 Presence of aortocoronary bypass graft: Secondary | ICD-10-CM | POA: Diagnosis not present

## 2022-10-19 DIAGNOSIS — R350 Frequency of micturition: Secondary | ICD-10-CM | POA: Diagnosis not present

## 2022-10-19 DIAGNOSIS — F0283 Dementia in other diseases classified elsewhere, unspecified severity, with mood disturbance: Secondary | ICD-10-CM | POA: Diagnosis not present

## 2022-10-19 DIAGNOSIS — N183 Chronic kidney disease, stage 3 unspecified: Secondary | ICD-10-CM | POA: Diagnosis not present

## 2022-10-19 DIAGNOSIS — I2510 Atherosclerotic heart disease of native coronary artery without angina pectoris: Secondary | ICD-10-CM | POA: Diagnosis not present

## 2022-10-19 DIAGNOSIS — E785 Hyperlipidemia, unspecified: Secondary | ICD-10-CM | POA: Diagnosis not present

## 2022-10-19 DIAGNOSIS — J4530 Mild persistent asthma, uncomplicated: Secondary | ICD-10-CM | POA: Diagnosis not present

## 2022-10-19 DIAGNOSIS — Z96641 Presence of right artificial hip joint: Secondary | ICD-10-CM | POA: Diagnosis not present

## 2022-10-19 DIAGNOSIS — Z7982 Long term (current) use of aspirin: Secondary | ICD-10-CM | POA: Diagnosis not present

## 2022-10-19 DIAGNOSIS — D649 Anemia, unspecified: Secondary | ICD-10-CM | POA: Diagnosis not present

## 2022-10-19 DIAGNOSIS — E039 Hypothyroidism, unspecified: Secondary | ICD-10-CM | POA: Diagnosis not present

## 2022-10-19 DIAGNOSIS — G309 Alzheimer's disease, unspecified: Secondary | ICD-10-CM | POA: Diagnosis not present

## 2022-10-19 DIAGNOSIS — I447 Left bundle-branch block, unspecified: Secondary | ICD-10-CM | POA: Diagnosis not present

## 2022-10-19 DIAGNOSIS — M792 Neuralgia and neuritis, unspecified: Secondary | ICD-10-CM | POA: Diagnosis not present

## 2022-10-19 NOTE — Telephone Encounter (Signed)
Spoke with patients daughter and scheduled patient to come in to discuss everything tomorrow at 3:20 pm.

## 2022-10-19 NOTE — Telephone Encounter (Signed)
Patients daughter would like a phone call from [facility] regarding her mom(patient) that's in an assistant living. She has question regarding some in information that the assistant living called and gave her today.

## 2022-10-19 NOTE — Telephone Encounter (Signed)
Stroke cannot be completely ruled out.  Yes, I recommend she come in ASAP for evaluation. Have her daughter bring the [facility] from assisted living so that we can figure out when she stopped receiving some of her medications.

## 2022-10-19 NOTE — Telephone Encounter (Signed)
Called patients daughter, [name] to get more information. The patient spend most of the day with family Sunday and Monday out of the assisted living facility. When daughter took patient back to assisted living yesterday evening she immediately got in her room and started undressing, and and she was stuttering so badly she could not form a sentence. [name] figured she was just very tired from all the stimulation over the last couple days being around her grandchildren and family.This went on for a while and patients daughter had to keep asking yes or no questions in order for patient to respond. She slowly came out of that last night, and assisted living called this morning to let her know that the patient had another spell of not being able to formulate a whole sentence. She just couldn't get the words out. She stated the assisted living did the stroke test on her to test her arm strength and does not believe it was or is currently a stroke. Patients daughter is wondering if the medication that the patient is coming off of, Aricept and Namenda could be causing this. Patients daughter wants to know how she should proceed, does she need an in office follow up?

## 2022-10-20 ENCOUNTER — Encounter: Payer: Self-pay | Admitting: *Deleted

## 2022-10-20 ENCOUNTER — Emergency Department: Payer: Medicare Other

## 2022-10-20 ENCOUNTER — Other Ambulatory Visit: Payer: Self-pay

## 2022-10-20 ENCOUNTER — Ambulatory Visit (INDEPENDENT_AMBULATORY_CARE_PROVIDER_SITE_OTHER): Payer: Medicare Other | Admitting: Primary Care

## 2022-10-20 VITALS — BP 112/70 | HR 72 | Temp 98.3°F | Ht 69.0 in | Wt 158.0 lb

## 2022-10-20 DIAGNOSIS — R4781 Slurred speech: Secondary | ICD-10-CM

## 2022-10-20 DIAGNOSIS — Z96641 Presence of right artificial hip joint: Secondary | ICD-10-CM | POA: Diagnosis not present

## 2022-10-20 DIAGNOSIS — R531 Weakness: Secondary | ICD-10-CM | POA: Diagnosis not present

## 2022-10-20 DIAGNOSIS — F0390 Unspecified dementia without behavioral disturbance: Secondary | ICD-10-CM | POA: Diagnosis not present

## 2022-10-20 DIAGNOSIS — R410 Disorientation, unspecified: Secondary | ICD-10-CM

## 2022-10-20 DIAGNOSIS — E039 Hypothyroidism, unspecified: Secondary | ICD-10-CM | POA: Insufficient documentation

## 2022-10-20 DIAGNOSIS — R4182 Altered mental status, unspecified: Secondary | ICD-10-CM | POA: Diagnosis present

## 2022-10-20 DIAGNOSIS — Z951 Presence of aortocoronary bypass graft: Secondary | ICD-10-CM | POA: Diagnosis not present

## 2022-10-20 DIAGNOSIS — G9340 Encephalopathy, unspecified: Principal | ICD-10-CM | POA: Insufficient documentation

## 2022-10-20 DIAGNOSIS — I2510 Atherosclerotic heart disease of native coronary artery without angina pectoris: Secondary | ICD-10-CM | POA: Diagnosis not present

## 2022-10-20 DIAGNOSIS — R109 Unspecified abdominal pain: Secondary | ICD-10-CM | POA: Insufficient documentation

## 2022-10-20 DIAGNOSIS — Z8673 Personal history of transient ischemic attack (TIA), and cerebral infarction without residual deficits: Secondary | ICD-10-CM | POA: Diagnosis not present

## 2022-10-20 DIAGNOSIS — U071 COVID-19: Secondary | ICD-10-CM | POA: Insufficient documentation

## 2022-10-20 DIAGNOSIS — Z79899 Other long term (current) drug therapy: Secondary | ICD-10-CM | POA: Insufficient documentation

## 2022-10-20 DIAGNOSIS — Z950 Presence of cardiac pacemaker: Secondary | ICD-10-CM | POA: Insufficient documentation

## 2022-10-20 DIAGNOSIS — R051 Acute cough: Secondary | ICD-10-CM | POA: Diagnosis not present

## 2022-10-20 DIAGNOSIS — Z7982 Long term (current) use of aspirin: Secondary | ICD-10-CM | POA: Insufficient documentation

## 2022-10-20 DIAGNOSIS — I129 Hypertensive chronic kidney disease with stage 1 through stage 4 chronic kidney disease, or unspecified chronic kidney disease: Secondary | ICD-10-CM | POA: Insufficient documentation

## 2022-10-20 DIAGNOSIS — N1831 Chronic kidney disease, stage 3a: Secondary | ICD-10-CM | POA: Diagnosis not present

## 2022-10-20 DIAGNOSIS — J45909 Unspecified asthma, uncomplicated: Secondary | ICD-10-CM | POA: Diagnosis not present

## 2022-10-20 LAB — URINALYSIS, ROUTINE W REFLEX MICROSCOPIC
Bacteria, UA: NONE SEEN
Bilirubin Urine: NEGATIVE
Glucose, UA: NEGATIVE mg/dL
Hgb urine dipstick: NEGATIVE
Ketones, ur: 5 mg/dL — AB
Leukocytes,Ua: NEGATIVE
Nitrite: NEGATIVE
Protein, ur: 30 mg/dL — AB
Specific Gravity, Urine: 1.026 (ref 1.005–1.030)
pH: 6 (ref 5.0–8.0)

## 2022-10-20 LAB — COMPREHENSIVE METABOLIC PANEL
ALT: 9 U/L (ref 0–44)
AST: 24 U/L (ref 15–41)
Albumin: 4.2 g/dL (ref 3.5–5.0)
Alkaline Phosphatase: 88 U/L (ref 38–126)
Anion gap: 13 (ref 5–15)
BUN: 10 mg/dL (ref 8–23)
CO2: 23 mmol/L (ref 22–32)
Calcium: 9.2 mg/dL (ref 8.9–10.3)
Chloride: 102 mmol/L (ref 98–111)
Creatinine, Ser: 1.13 mg/dL — ABNORMAL HIGH (ref 0.44–1.00)
GFR, Estimated: 47 mL/min — ABNORMAL LOW (ref >60)
Glucose, Bld: 111 mg/dL — ABNORMAL HIGH (ref 70–99)
Potassium: 3.6 mmol/L (ref 3.5–5.1)
Sodium: 138 mmol/L (ref 135–145)
Total Bilirubin: 1.6 mg/dL — ABNORMAL HIGH (ref 0.3–1.2)
Total Protein: 7.5 g/dL (ref 6.5–8.1)

## 2022-10-20 LAB — CBC
HCT: 44.2 % (ref 36.0–46.0)
Hemoglobin: 14.6 g/dL (ref 12.0–15.0)
MCH: 30.7 pg (ref 26.0–34.0)
MCHC: 33 g/dL (ref 30.0–36.0)
MCV: 93.1 fL (ref 80.0–100.0)
Platelets: 156 10*3/uL (ref 150–400)
RBC: 4.75 MIL/uL (ref 3.87–5.11)
RDW: 13.3 % (ref 11.5–15.5)
WBC: 4.5 10*3/uL (ref 4.0–10.5)
nRBC: 0 % (ref 0.0–0.2)

## 2022-10-20 LAB — CBG MONITORING, ED: Glucose-Capillary: 93 mg/dL (ref 70–99)

## 2022-10-20 LAB — TROPONIN I (HIGH SENSITIVITY)
Troponin I (High Sensitivity): 18 ng/L — ABNORMAL HIGH (ref <18)
Troponin I (High Sensitivity): 18 ng/L — ABNORMAL HIGH (ref <18)

## 2022-10-20 NOTE — Assessment & Plan Note (Signed)
Lungs clear on exam today. She doesn't appear sickly.  Discussed use of Delsym DM or Robitussin DM.  Continue inhalers as prescribed.

## 2022-10-20 NOTE — Assessment & Plan Note (Addendum)
Abrupt and residual change from baseline.  Neuro exam today negative except for left lower extremity weakness which is concerning for TIA/Stroke. Unable to collect UA today.  Stat CT head ordered; however, given the late hours of the day the scheduling department will not schedule and recommended ED eval. Discussed with patient's granddaughter and daughter, they will take her to [facility] ED.  We will call the triage nurse at [facility] to discuss her case.

## 2022-10-20 NOTE — ED Notes (Signed)
Fsbs 93 °

## 2022-10-20 NOTE — Progress Notes (Addendum)
Subjective:    Patient ID: [name], female    DOB: February 10, 1934, 88 y.o.   MRN: [id number]  HPI  [name] is a very pleasant 88 y.o. female with a history of AAA, CAD, Hypertension, asthma, hypothyroidism, Dementia, osteoporosis, CKD, pacemaker in place who presents today for evaluation of confusion. Her granddaughter joins [place] in person and her daughter joins [place] on the phone who provides the information for HPI.  Symptom onset two evenings ago just after returning to her assisted living home from a two day family Christmas event. When her daughter brought her back to her room she began undressing, stuttering her words, unable to form a sentence. Her daughter suspected this was secondary to the increased stimulation from the family event. After a few hours she returned to baseline. Yesterday morning her assisted living facility contacted her daughter as her symptoms returned. She was unable to formulate sentences, stuttering on words. The assisted living staff performed a stroke evaluation and determined that she was not having a stroke.   During her last visit in October 2023, both she and her daughter were requesting to eliminate unnecessary medications from her regimen. Her daughter felt that her Namenda and Aricept were not helping as her dementia had progressed, both medications were discontinued. Her last dose of each medication was in late October 2023.  Her granddaughter mentions that she's noticed left leg weakness in her grandmother today. She is dragging her left leg when walking which is not usual. She's been able to form sentences and has been acting per her baseline except for dragging her left leg.   Aside from these recent episodes she's been doing well otherwise. She does get disoriented at times, this occurred prior to discontinuation of Namenda and Aricept. Her appetite has decreased, is eating more meals in her room. Her daughter notes that she is sleeping later  during the day.   She denies falls, headaches, back pain, dizziness, changes in bowel/bladder control. Her family has not heard from the staff that she's fallen.   Her family has noticed a deep, congested cough over the last 2 days. No fevers.   Review of Systems  Genitourinary:  Negative for dysuria and frequency.  Musculoskeletal:  Negative for back pain.  Neurological:  Positive for speech difficulty and weakness. Negative for dizziness and headaches.  Psychiatric/Behavioral:  Positive for confusion. The patient is not nervous/anxious.          Past Medical History:  Diagnosis Date   AAA (abdominal aortic aneurysm) (HCC) 01/03/13   3.5x3.3cm   Alzheimer's dementia (HCC)    "probably middle stage" (11/05/2014)   Anemia    "hx of chronic" (11/05/2014)   Aortic stenosis 03/29/2008   biological prosthetic replacement   Arthritis    "joints" (11/05/2014)   Asthma    Avascular necrosis of bone of hip, right (HCC) 07/28/2018   Chronic asthmatic bronchitis    "q fall and winter" (11/05/2014)   Closed nondisplaced fracture of middle phalanx of lesser toe of right foot 01/05/2021   Coronary artery disease    CABG 03/29/08   Dyslipidemia    Exertional shortness of breath    Family history of adverse reaction to anesthesia    "daughter gets PONV too"   Heart murmur    Hypertension    Hypothyroidism    LBBB (left bundle branch block)    Migraines    "none in years' (11/05/2014)   Mobitz type 2 second degree AV block  10/31/2014   MVP (mitral valve prolapse)    with mild mitral insufficiency/notes 08/17/2013   Near syncope 08/17/2013   PONV (postoperative nausea and vomiting)    Presence of permanent cardiac pacemaker    Sinus pause 10/31/2014   Stroke (HCC) 03/2008   "2 light strokes"; denies residual on 11/05/2014   Urinary frequency     Social History   Socioeconomic History   Marital status: Divorced    Spouse name: Not on file   Number of children: 1   Years of education: Some  college   Highest education level: Not on file  Occupational History   Occupation: Retired  Tobacco Use   Smoking status: Never   Smokeless tobacco: Never  Vaping Use   Vaping Use: Never used  Substance and Sexual Activity   Alcohol use: No    Alcohol/week: 0.0 standard drinks of alcohol   Drug use: No   Sexual activity: Not Currently  Other Topics Concern   Not on file  Social History Narrative   Pt lives in single story home with her daughter and son-in-law   Has 1 child   Some college education   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 [employer]    Social Determinants of Health   Financial Resource Strain: Low Risk  (06/26/2019)   Overall Financial Resource Strain (CARDIA)    Difficulty of Paying Living Expenses: Not hard at all  Food Insecurity: No Food Insecurity (06/26/2019)   Hunger Vital Sign    Worried About Running Out of Food in the Last Year: Never true    Ran Out of Food in the Last Year: Never true  Transportation Needs: No Transportation Needs (06/26/2019)   PRAPARE - Administrator, Civil Service (Medical): No    Lack of Transportation (Non-Medical): No  Physical Activity: Inactive (06/26/2019)   Exercise Vital Sign    Days of Exercise per Week: 0 days    Minutes of Exercise per Session: 0 min  Stress: No Stress Concern Present (06/26/2019)   [employer] of Occupational Health - Occupational Stress Questionnaire    Feeling of Stress : Not at all  Social Connections: Not on file  Intimate Partner Violence: Not At Risk (06/26/2019)   Humiliation, Afraid, Rape, and Kick questionnaire    Fear of Current or Ex-Partner: No    Emotionally Abused: No    Physically Abused: No    Sexually Abused: No    Past Surgical History:  Procedure Laterality Date   ABDOMINAL HYSTERECTOMY  1977   AORTIC VALVE REPLACEMENT  03/29/2008   PERICARDIAL TISSUE VALVE   APPENDECTOMY  ?1977   CARDIAC CATHETERIZATION  ~ 2009   CATARACT EXTRACTION W/  INTRAOCULAR LENS IMPLANT Left    CORONARY ARTERY BYPASS GRAFT  03/29/2008   LIMA TO LAD,SVG TO CX,SVG TO LEFT POSTEROLATERAL BRANCH   HIP FRACTURE SURGERY Right 06/2010   "3 metal screws"    INSERT / REPLACE / REMOVE PACEMAKER  11/05/2014   LOOP RECORDER EXPLANT N/A 11/05/2014   Procedure: LOOP RECORDER EXPLANT;  Surgeon: [name], MD;  Location: [facility];  Service: Cardiovascular;  Laterality: N/A;   LOOP RECORDER IMPLANT N/A 09/17/2013   Procedure: LOOP RECORDER IMPLANT;  Surgeon: [name], MD;  Location: [facility];  Service: Cardiovascular;  Laterality: N/A;   PERMANENT PACEMAKER INSERTION N/A 11/05/2014   Procedure: PERMANENT PACEMAKER INSERTION;  Surgeon: [name], MD;  Location: [facility];  Service: Cardiovascular;  Laterality:  N/A;   REFRACTIVE SURGERY Bilateral    [name] 04/28/2008 (08/17/2013)   RETINAL DETACHMENT SURGERY Left 1994   [name] 04/10/2008 (08/17/2013)   TIBIAL TUBERCLERPLASTY  11/05/2014   tooth pulled   06/2018   TOTAL HIP ARTHROPLASTY Right 07/31/2018   Procedure: RIGHT TOTAL HIP ARTHROPLASTY ANTERIOR APPROACH;  Surgeon: Gean Birchwood, MD;  Location: [facility];  Service: Orthopedics;  Laterality: Right;   UTERINE FIBROID SURGERY  1960's    Family History  Problem Relation Age of Onset   Heart failure Mother    Diabetes Mother    Hypertension Mother    Hyperlipidemia Mother    Melanoma Grandchild    Colon cancer Neg Hx     Allergies  Allergen Reactions   Codeine Nausea And Vomiting and Other (See Comments)    Patient gets violently ill   Morphine And Related Nausea And Vomiting and Other (See Comments)    Patient get violently ill    Current Outpatient Medications on File Prior to Visit  Medication Sig Dispense Refill   alendronate (FOSAMAX) 70 MG tablet Take 1 tablet (70 mg total) by mouth once a week. No food or other medications for 30 min. Avoid laying flat for two hours. 12 tablet 1   amoxicillin (AMOXIL) 500 MG tablet Take 2,000 mg by  mouth See admin instructions. Take 2,000mg  by mouth 1 hour prior to dental appointments (Patient not taking: Reported on 08/06/2022)  1   aspirin EC (ASPIRIN LOW DOSE) 81 MG tablet Take 1 tablet (81 mg total) by mouth daily. 90 tablet 3   atorvastatin (LIPITOR) 40 MG tablet Take 1 tablet (40 mg total) by mouth daily. For cholesterol. 90 tablet 3   brimonidine (ALPHAGAN) 0.2 % ophthalmic solution Place 1 drop into the left eye 2 (two) times daily.  3   Calcium Carbonate-Vit D-Min (CALTRATE 600+D PLUS MINERALS) 600-800 MG-UNIT TABS Take 1 tablet by mouth twice daily for calcium and vitamin D. 180 tablet 3   fluticasone (FLONASE) 50 MCG/ACT nasal spray PLACE 1 SPRAY INTO BOTH NOSTRILS DAILY 48 g 3   fluticasone-salmeterol (ADVAIR) 250-50 MCG/ACT AEPB INHALE 1 PUFF INTO THE LUNGS 2 TIMES DAILY FOR COPD. RINSE MOUTH AFTER USE 60 each 11   furosemide (LASIX) 40 MG tablet TAKE 1 TABLET BY MOUTH DAILY 90 tablet 0   levothyroxine (SYNTHROID) 50 MCG tablet TAKE 1 TABLET BY MOUTH EVERY MORNING ON AN EMPTY STOMACH WITH WATER ONLY. NO FOOD OR OTHER MEDICATIONS FOR 30 MINS. 90 tablet 3   losartan (COZAAR) 100 MG tablet TAKE 1 TABLET BY MOUTH DAILY FOR BLOOD PRESSURE 90 tablet 3   potassium chloride (KLOR-CON) 10 MEQ tablet Take 2 tablets (20 mEq total) by mouth 2 (two) times daily. 120 tablet 11   No current facility-administered medications on file prior to visit.    BP 112/70 (BP Location: Left Arm, Patient Position: Sitting, Cuff Size: Normal)   Pulse 72   Temp 98.3 F (36.8 C) (Temporal)   Ht 5\' 9"  (1.753 m)   Wt 158 lb (71.7 kg)   SpO2 96%   BMI 23.33 kg/m  Objective:   Physical Exam Eyes:     Extraocular Movements: Extraocular movements intact.  Cardiovascular:     Rate and Rhythm: Normal rate and regular rhythm.  Pulmonary:     Effort: Pulmonary effort is normal.     Breath sounds: Normal breath sounds. No rhonchi.     Comments: Deep, congested cough noted during visit. Skin:    General:  Skin is warm and dry.  Neurological:     Mental Status: She is alert.     Motor: Weakness present.     Coordination: Coordination normal.     Comments: Follows commands but cannot participate in HPI to answer questions.   Left lower extremity weakness noted on exam today. Has to focus on lifting her left leg. Requires assistance with walking.  Psychiatric:        Mood and Affect: Mood normal.           Assessment & Plan:   Problem List Items Addressed This Visit       Other   Slurred speech - Primary    Abrupt and residual change from baseline.  Neuro exam today negative except for left lower extremity weakness which is concerning for TIA/Stroke. Unable to collect UA today.  Stat CT head ordered; however, given the late hours of the day the scheduling department will not schedule and recommended ED eval. Discussed with patient's granddaughter and daughter, they will take her to [facility] ED.  We will call the triage nurse at [facility] to discuss her case.      Relevant Orders   CT HEAD WO CONTRAST ( )   Acute cough    Lungs clear on exam today. She doesn't appear sickly.  Discussed use of Delsym DM or Robitussin DM.  Continue inhalers as prescribed.      Other Visit Diagnoses     Acute confusion       Relevant Orders   POCT Urinalysis Dipstick (Automated)   Urine Culture          Doreene Nest, NP

## 2022-10-20 NOTE — Patient Instructions (Signed)
You may take Delsym DM or Robitussin DM every 8 hours as needed for the cough and congestion.  You will be contacted regarding the CT scan of your head.  Bring back the urine sample as soon as possible.  It was a pleasure to see you today!

## 2022-10-20 NOTE — ED Triage Notes (Signed)
Pt from the home place.  Pt has altered mental status with weakness for days.  Pt had episode of slurred speech and diff ambulating.  Grand daughter with pt.  Pt alert.  Speech clear.

## 2022-10-20 NOTE — ED Provider Triage Note (Addendum)
Emergency Medicine Provider Triage Evaluation Note  [name], a 88 y.o. female  was evaluated in triage.  Pt complains of sharp-like symptoms.  Patient's granddaughter is present, who gets report from the facility, that the patient exhibited some confusion and slurred speech on Monday.  Patient presents today, after being evaluated by the primary provider, and notes some left leg weakness. Granddaughter reports speech symptoms have resolved  Review of Systems  Positive: Slurred speech, LLE weakness Negative: Head injury  Physical Exam  There were no vitals taken for this visit. Gen:   Awake, no distress  NAD Resp:  Normal effort  MSK:   Moves extremities without difficulty. Normal ROM BLE NEURO: CN II-XII grossly intact  Medical Decision Making  Medically screening exam initiated at 5:18 PM.  Appropriate orders placed.  [name] was informed that the remainder of the evaluation will be completed by another provider, this initial triage assessment does not replace that evaluation, and the importance of remaining in the ED until their evaluation is complete.  Geriatric patient to the ED for evaluation of strokelike symptoms with onset prior.  Patient presents today with new left lower extremity weakness with prior reports of slurred speech and aphasia.   Lissa Hoard, PA-C 10/20/22 1719    Lissa Hoard, PA-C 10/20/22 1749

## 2022-10-21 ENCOUNTER — Observation Stay
Admission: EM | Admit: 2022-10-21 | Discharge: 2022-10-22 | Disposition: A | Discharge status: Home / Self Care | Payer: Medicare Other | Attending: Internal Medicine | Admitting: Internal Medicine

## 2022-10-21 ENCOUNTER — Observation Stay: Payer: Medicare Other

## 2022-10-21 ENCOUNTER — Emergency Department: Payer: Medicare Other

## 2022-10-21 DIAGNOSIS — E785 Hyperlipidemia, unspecified: Secondary | ICD-10-CM | POA: Diagnosis present

## 2022-10-21 DIAGNOSIS — G9341 Metabolic encephalopathy: Secondary | ICD-10-CM | POA: Diagnosis not present

## 2022-10-21 DIAGNOSIS — R531 Weakness: Secondary | ICD-10-CM | POA: Diagnosis not present

## 2022-10-21 DIAGNOSIS — I10 Essential (primary) hypertension: Secondary | ICD-10-CM | POA: Diagnosis not present

## 2022-10-21 DIAGNOSIS — I5A Non-ischemic myocardial injury (non-traumatic): Secondary | ICD-10-CM | POA: Diagnosis present

## 2022-10-21 DIAGNOSIS — U071 COVID-19: Secondary | ICD-10-CM | POA: Diagnosis present

## 2022-10-21 DIAGNOSIS — I639 Cerebral infarction, unspecified: Secondary | ICD-10-CM | POA: Diagnosis not present

## 2022-10-21 DIAGNOSIS — J45909 Unspecified asthma, uncomplicated: Secondary | ICD-10-CM | POA: Diagnosis present

## 2022-10-21 DIAGNOSIS — K8689 Other specified diseases of pancreas: Secondary | ICD-10-CM | POA: Diagnosis not present

## 2022-10-21 DIAGNOSIS — N1831 Chronic kidney disease, stage 3a: Secondary | ICD-10-CM | POA: Diagnosis present

## 2022-10-21 DIAGNOSIS — N2889 Other specified disorders of kidney and ureter: Secondary | ICD-10-CM | POA: Diagnosis not present

## 2022-10-21 DIAGNOSIS — R4182 Altered mental status, unspecified: Secondary | ICD-10-CM | POA: Diagnosis not present

## 2022-10-21 DIAGNOSIS — E039 Hypothyroidism, unspecified: Secondary | ICD-10-CM | POA: Diagnosis present

## 2022-10-21 DIAGNOSIS — I517 Cardiomegaly: Secondary | ICD-10-CM | POA: Diagnosis not present

## 2022-10-21 DIAGNOSIS — Z950 Presence of cardiac pacemaker: Secondary | ICD-10-CM | POA: Diagnosis not present

## 2022-10-21 DIAGNOSIS — K869 Disease of pancreas, unspecified: Secondary | ICD-10-CM | POA: Diagnosis present

## 2022-10-21 DIAGNOSIS — M25552 Pain in left hip: Secondary | ICD-10-CM | POA: Diagnosis not present

## 2022-10-21 DIAGNOSIS — I2510 Atherosclerotic heart disease of native coronary artery without angina pectoris: Secondary | ICD-10-CM | POA: Diagnosis present

## 2022-10-21 DIAGNOSIS — K8020 Calculus of gallbladder without cholecystitis without obstruction: Secondary | ICD-10-CM | POA: Diagnosis not present

## 2022-10-21 LAB — C-REACTIVE PROTEIN: CRP: 0.6 mg/dL (ref <1.0)

## 2022-10-21 LAB — RESP PANEL BY RT-PCR (RSV, FLU A&B, COVID)  RVPGX2
Influenza A by PCR: NEGATIVE
Influenza B by PCR: NEGATIVE
Resp Syncytial Virus by PCR: NEGATIVE
SARS Coronavirus 2 by RT PCR: POSITIVE — AB

## 2022-10-21 MED ORDER — ONDANSETRON HCL 4 MG PO TABS: 4.0000 mg | ORAL_TABLET | Freq: Four times a day (QID) | ORAL | Status: DC | PRN

## 2022-10-21 MED ORDER — POTASSIUM CHLORIDE CRYS ER 20 MEQ PO TBCR
20.0000 meq | EXTENDED_RELEASE_TABLET | Freq: Two times a day (BID) | ORAL | Status: DC
  Administered 2022-10-21: 20 meq via ORAL
  Filled 2022-10-21: qty 1

## 2022-10-21 MED ORDER — NIRMATRELVIR/RITONAVIR (PAXLOVID) TABLET (RENAL DOSING)
2.0000 | ORAL_TABLET | Freq: Two times a day (BID) | ORAL | Status: DC
  Administered 2022-10-21: 2 via ORAL
  Filled 2022-10-21: qty 20

## 2022-10-21 MED ORDER — HYDRALAZINE HCL 20 MG/ML IJ SOLN: 5.0000 mg | INTRAMUSCULAR | Status: DC | PRN

## 2022-10-21 MED ORDER — ASPIRIN 81 MG PO TBEC
81.0000 mg | DELAYED_RELEASE_TABLET | Freq: Every day | ORAL | Status: DC
  Administered 2022-10-21: 81 mg via ORAL
  Filled 2022-10-21: qty 1

## 2022-10-21 MED ORDER — MOMETASONE FURO-FORMOTEROL FUM 200-5 MCG/ACT IN AERO
2.0000 | INHALATION_SPRAY | Freq: Two times a day (BID) | RESPIRATORY_TRACT | Status: DC
  Filled 2022-10-21: qty 8.8

## 2022-10-21 MED ORDER — FLUTICASONE PROPIONATE 50 MCG/ACT NA SUSP: 1.0000 | Freq: Every day | NASAL | Status: DC | PRN

## 2022-10-21 MED ORDER — ALENDRONATE SODIUM 70 MG PO TABS: 70.0000 mg | ORAL_TABLET | ORAL | Status: DC

## 2022-10-21 MED ORDER — VITAMIN C 500 MG PO TABS
500.0000 mg | ORAL_TABLET | Freq: Every day | ORAL | Status: DC
  Administered 2022-10-21: 500 mg via ORAL
  Filled 2022-10-21: qty 1

## 2022-10-21 MED ORDER — FUROSEMIDE 40 MG PO TABS: 40.0000 mg | ORAL_TABLET | Freq: Every day | ORAL | Status: DC

## 2022-10-21 MED ORDER — ONDANSETRON HCL 4 MG/2ML IJ SOLN: 4.0000 mg | Freq: Four times a day (QID) | INTRAMUSCULAR | Status: DC | PRN

## 2022-10-21 MED ORDER — HYDROCOD POLI-CHLORPHE POLI ER 10-8 MG/5ML PO SUER: 5.0000 mL | Freq: Two times a day (BID) | ORAL | Status: DC | PRN

## 2022-10-21 MED ORDER — LEVOTHYROXINE SODIUM 50 MCG PO TABS
50.0000 ug | ORAL_TABLET | Freq: Every day | ORAL | Status: DC
  Administered 2022-10-22: 50 ug via ORAL
  Filled 2022-10-21: qty 1

## 2022-10-21 MED ORDER — IOHEXOL 300 MG/ML  SOLN
80.0000 mL | Freq: Once | INTRAMUSCULAR | Status: AC | PRN
  Administered 2022-10-21: 80 mL via INTRAVENOUS

## 2022-10-21 MED ORDER — OYSTER SHELL CALCIUM/D3 500-5 MG-MCG PO TABS
1.0000 | ORAL_TABLET | Freq: Every day | ORAL | Status: DC
  Administered 2022-10-21: 1 via ORAL
  Filled 2022-10-21: qty 1

## 2022-10-21 MED ORDER — CLOPIDOGREL BISULFATE 75 MG PO TABS
75.0000 mg | ORAL_TABLET | Freq: Every day | ORAL | Status: DC
  Administered 2022-10-21: 75 mg via ORAL
  Filled 2022-10-21: qty 1

## 2022-10-21 MED ORDER — ALBUTEROL SULFATE HFA 108 (90 BASE) MCG/ACT IN AERS: 2.0000 | INHALATION_SPRAY | RESPIRATORY_TRACT | Status: DC | PRN

## 2022-10-21 MED ORDER — LOSARTAN POTASSIUM 50 MG PO TABS
100.0000 mg | ORAL_TABLET | Freq: Every day | ORAL | Status: DC
  Administered 2022-10-21: 100 mg via ORAL
  Filled 2022-10-21: qty 2

## 2022-10-21 MED ORDER — STROKE: EARLY STAGES OF RECOVERY BOOK: Freq: Once | Status: DC

## 2022-10-21 MED ORDER — ZINC SULFATE 220 (50 ZN) MG PO CAPS: 220.0000 mg | ORAL_CAPSULE | Freq: Every day | ORAL | Status: DC

## 2022-10-21 MED ORDER — GUAIFENESIN-DM 100-10 MG/5ML PO SYRP: 10.0000 mL | ORAL_SOLUTION | ORAL | Status: DC | PRN

## 2022-10-21 MED ORDER — NIRMATRELVIR/RITONAVIR (PAXLOVID)TABLET: 3.0000 | ORAL_TABLET | Freq: Two times a day (BID) | ORAL | Status: DC

## 2022-10-21 MED ORDER — SODIUM CHLORIDE 0.9 % IV SOLN: INTRAVENOUS | Status: DC

## 2022-10-21 MED ORDER — ATORVASTATIN CALCIUM 20 MG PO TABS: 40.0000 mg | ORAL_TABLET | Freq: Every day | ORAL | Status: DC

## 2022-10-21 MED ORDER — MAGNESIUM HYDROXIDE 400 MG/5ML PO SUSP: 30.0000 mL | Freq: Every day | ORAL | Status: DC | PRN

## 2022-10-21 MED ORDER — TRAZODONE HCL 50 MG PO TABS: 25.0000 mg | ORAL_TABLET | Freq: Every evening | ORAL | Status: DC | PRN

## 2022-10-21 MED ORDER — ENOXAPARIN SODIUM 40 MG/0.4ML IJ SOSY
40.0000 mg | PREFILLED_SYRINGE | INTRAMUSCULAR | Status: DC
  Administered 2022-10-21: 40 mg via SUBCUTANEOUS
  Filled 2022-10-21: qty 0.4

## 2022-10-21 MED ORDER — HALOPERIDOL LACTATE 5 MG/ML IJ SOLN
2.0000 mg | Freq: Once | INTRAMUSCULAR | Status: AC
  Administered 2022-10-21: 2 mg via INTRAVENOUS
  Filled 2022-10-21: qty 1

## 2022-10-21 MED ORDER — FAMOTIDINE 20 MG PO TABS
10.0000 mg | ORAL_TABLET | Freq: Two times a day (BID) | ORAL | Status: DC
  Administered 2022-10-21: 10 mg via ORAL
  Filled 2022-10-21: qty 1

## 2022-10-21 MED ORDER — BRIMONIDINE TARTRATE 0.2 % OP SOLN: 1.0000 [drp] | Freq: Two times a day (BID) | OPHTHALMIC | Status: DC

## 2022-10-21 MED ORDER — LOSARTAN POTASSIUM 50 MG PO TABS: 100.0000 mg | ORAL_TABLET | Freq: Every day | ORAL | Status: DC

## 2022-10-21 NOTE — ED Notes (Signed)
Pt eating from her lunch tray. Shawn NT at bedside while pt's daughter takes quick break.

## 2022-10-21 NOTE — Progress Notes (Signed)
PHARMACIST - PHYSICIAN COMMUNICATION  CONCERNING: P&T Medication Policy Regarding Oral Bisphosphonates  RECOMMENDATION: Your order for alendronate (Fosamax), ibandronate (Boniva), or risedronate (Actonel) has been discontinued at this time.  If the patient's post-hospital medical condition warrants safe use of this class of drugs, please resume the pre-hospital regimen upon discharge.  DESCRIPTION:  Alendronate (Fosamax), ibandronate (Boniva), and risedronate (Actonel) can cause severe esophageal erosions in patients who are unable to remain upright at least 30 minutes after taking this medication.   Since brief interruptions in therapy are thought to have minimal impact on bone mineral density, the Pharmacy & Therapeutics Committee has established that bisphosphonate orders should be routinely discontinued during hospitalization.   To override this safety policy and permit administration of Boniva, Fosamax, or Actonel in the hospital, prescribers must write "DO NOT HOLD" in the comments section when placing the order for this class of medications.  Otelia Sergeant, PharmD, [facility] 10/21/2022 6:23 AM

## 2022-10-21 NOTE — Consult Note (Signed)
NEURO HOSPITALIST CONSULT NOTE   Requestig physician: Dr. Clyde Lundborg  Reason for Consult: Stroke like symptoms in the setting of influenza virus infection  History obtained from:  Chart     HPI:                                                                                                                                          [name] is an 88 y.o. female assisted living resident with a PMHx of AAA, CKD3, Alzheimer's dementia, anemia, aortic stenosis s/p biological prosthetic replacement, arthritis, chronic asthmatic bronchitis, avascular necrosis of right hip, CAD s/p CABG, HLD, HTN, hypothyoroidism, remote history of migraines, cardiac conduction defects s/p pacemaker placement and 2 "light" strokes in the past without residual deficit who presented to the ED yesterday with AMS and weakness for the past 4 days PTA. She was noted to have an episode of slurred speech and difficulty ambulating. Patient has been dragging her left leg. At the time of presentation, the patient could not recognize her daughter as clearly as before. Patient has had difficulty speaking and has had a stuttering quality to her speech. Also has had a cough. Covid PCR was positive.   At her normal baseline, patient recognizes family members, but usually is not oriented to place or time.  Home medications include ASA and atorvastatin.   Past Medical History:  Diagnosis Date   AAA (abdominal aortic aneurysm) (HCC) 01/03/13   3.5x3.3cm   Alzheimer's dementia (HCC)    "probably middle stage" (11/05/2014)   Anemia    "hx of chronic" (11/05/2014)   Aortic stenosis 03/29/2008   biological prosthetic replacement   Arthritis    "joints" (11/05/2014)   Asthma    Avascular necrosis of bone of hip, right (HCC) 07/28/2018   Chronic asthmatic bronchitis    "q fall and winter" (11/05/2014)   Closed nondisplaced fracture of middle phalanx of lesser toe of right foot 01/05/2021   Coronary artery disease    CABG  03/29/08   Dyslipidemia    Exertional shortness of breath    Family history of adverse reaction to anesthesia    "daughter gets PONV too"   Heart murmur    Hypertension    Hypothyroidism    LBBB (left bundle branch block)    Migraines    "none in years' (11/05/2014)   Mobitz type 2 second degree AV block 10/31/2014   MVP (mitral valve prolapse)    with mild mitral insufficiency/notes 08/17/2013   Near syncope 08/17/2013   PONV (postoperative nausea and vomiting)    Presence of permanent cardiac pacemaker    Sinus pause 10/31/2014   Stroke (HCC) 03/2008   "2 light strokes"; denies residual on 11/05/2014   Urinary frequency     Past Surgical History:  Procedure Laterality Date   ABDOMINAL HYSTERECTOMY  1977   AORTIC VALVE REPLACEMENT  03/29/2008   PERICARDIAL TISSUE VALVE   APPENDECTOMY  ?1977   CARDIAC CATHETERIZATION  ~ 2009   CATARACT EXTRACTION W/ INTRAOCULAR LENS IMPLANT Left    CORONARY ARTERY BYPASS GRAFT  03/29/2008   LIMA TO LAD,SVG TO CX,SVG TO LEFT POSTEROLATERAL BRANCH   HIP FRACTURE SURGERY Right 06/2010   "3 metal screws"    INSERT / REPLACE / REMOVE PACEMAKER  11/05/2014   LOOP RECORDER EXPLANT N/A 11/05/2014   Procedure: LOOP RECORDER EXPLANT;  Surgeon: [name], MD;  Location: [facility];  Service: Cardiovascular;  Laterality: N/A;   LOOP RECORDER IMPLANT N/A 09/17/2013   Procedure: LOOP RECORDER IMPLANT;  Surgeon: [name], MD;  Location: [facility];  Service: Cardiovascular;  Laterality: N/A;   PERMANENT PACEMAKER INSERTION N/A 11/05/2014   Procedure: PERMANENT PACEMAKER INSERTION;  Surgeon: [name], MD;  Location: [facility];  Service: Cardiovascular;  Laterality: N/A;   REFRACTIVE SURGERY Bilateral    /notes 04/28/2008 (08/17/2013)   RETINAL DETACHMENT SURGERY Left 1994   [name] 04/10/2008 (08/17/2013)   TIBIAL TUBERCLERPLASTY  11/05/2014   tooth pulled   06/2018   TOTAL HIP ARTHROPLASTY Right 07/31/2018   Procedure: RIGHT TOTAL HIP ARTHROPLASTY  ANTERIOR APPROACH;  Surgeon: Gean Birchwood, MD;  Location: [facility];  Service: Orthopedics;  Laterality: Right;   UTERINE FIBROID SURGERY  1960's    Family History  Problem Relation Age of Onset   Heart failure Mother    Diabetes Mother    Hypertension Mother    Hyperlipidemia Mother    Melanoma Grandchild    Colon cancer Neg Hx              Social History:  reports that she has never smoked. She has never used smokeless tobacco. She reports that she does not drink alcohol and does not use drugs.  Allergies  Allergen Reactions   Codeine Nausea And Vomiting and Other (See Comments)    Patient gets violently ill   Morphine And Related Nausea And Vomiting and Other (See Comments)    Patient get violently ill    MEDICATIONS:                                                                                                                     No current facility-administered medications on file prior to encounter.   Current Outpatient Medications on File Prior to Encounter  Medication Sig Dispense Refill   alendronate (FOSAMAX) 70 MG tablet Take 1 tablet (70 mg total) by mouth once a week. No food or other medications for 30 min. Avoid laying flat for two hours. 12 tablet 1   amoxicillin (AMOXIL) 500 MG tablet Take 2,000 mg by mouth See admin instructions. Take 2,000mg  by mouth 1 hour prior to dental appointments (Patient not taking: Reported on 08/06/2022)  1   aspirin EC (ASPIRIN LOW DOSE) 81 MG tablet Take 1 tablet (81 mg total)  by mouth daily. 90 tablet 3   atorvastatin (LIPITOR) 40 MG tablet Take 1 tablet (40 mg total) by mouth daily. For cholesterol. 90 tablet 3   brimonidine (ALPHAGAN) 0.2 % ophthalmic solution Place 1 drop into the left eye 2 (two) times daily.  3   Calcium Carbonate-Vit D-Min (CALTRATE 600+D PLUS MINERALS) 600-800 MG-UNIT TABS Take 1 tablet by mouth twice daily for calcium and vitamin D. 180 tablet 3   fluticasone (FLONASE) 50 MCG/ACT nasal spray PLACE 1 SPRAY INTO  BOTH NOSTRILS DAILY 48 g 3   fluticasone-salmeterol (ADVAIR) 250-50 MCG/ACT AEPB INHALE 1 PUFF INTO THE LUNGS 2 TIMES DAILY FOR COPD. RINSE MOUTH AFTER USE 60 each 11   furosemide (LASIX) 40 MG tablet TAKE 1 TABLET BY MOUTH DAILY 90 tablet 0   levothyroxine (SYNTHROID) 50 MCG tablet TAKE 1 TABLET BY MOUTH EVERY MORNING ON AN EMPTY STOMACH WITH WATER ONLY. NO FOOD OR OTHER MEDICATIONS FOR 30 MINS. 90 tablet 3   losartan (COZAAR) 100 MG tablet TAKE 1 TABLET BY MOUTH DAILY FOR BLOOD PRESSURE 90 tablet 3   potassium chloride (KLOR-CON) 10 MEQ tablet Take 2 tablets (20 mEq total) by mouth 2 (two) times daily. 120 tablet 11    Scheduled:  [START ON 10/22/2022]  stroke: early stages of recovery book   Does not apply Once   vitamin C  500 mg Oral Daily   aspirin EC  81 mg Oral Daily   [START ON 10/26/2022] atorvastatin  40 mg Oral Daily   [START ON 10/22/2022] brimonidine  1 drop Left Eye BID   clopidogrel  75 mg Oral Daily   enoxaparin (LOVENOX) injection  40 mg Subcutaneous Q24H   famotidine  10 mg Oral BID   levothyroxine  50 mcg Oral Q0600   nirmatrelvir/ritonavir (renal dosing)  2 tablet Oral BID   potassium chloride SA  20 mEq Oral BID   Continuous:  sodium chloride       ROS:                                                                                                                                       No fever, chills, N/V, CP, diarrhea or abdominal pain. Has a dry cough. Other ROS as per HPI.    Blood pressure 127/66, pulse 65, temperature 99.4 F (37.4 C), temperature source Oral, resp. rate 18, height  (1.753 m), weight 71 kg, SpO2 96 %.   General Examination:  Physical Exam  HEENT-  [place]/AT Lungs- Respirations unlabored Extremities- No edema  Neurological Examination Mental Status: Awake and alert. Pleasant and cooperative. Oriented to self but does not know the  month, day, year, city or state, which daughter states is her baseline. Can name a thumb and pinky finger but not able to name any other fingers. Has difficulty with comprehension of several basic questions. Follows all commands but at times needs them repeated to her. Speech is sparse but fluent. Poverty of thought. No dysarthria.  Cranial Nerves: II: Temporal visual fields intact bilaterally. No extinction to DSS. PERRL. III,IV, VI: No ptosis. EOMI. No nystagmus. V: FT sensation equal bilaterally VII: Smile symmetric VIII: Hearing intact to voice IX,X: No hypophonia or hoarseness XI: Symmetric XII: Midline tongue extension Motor: RUE: 5/5 RLE: 5/5 LUE: 5/5 LLE: 5/5 Sensory: Temp and FT intact x 4.  Deep Tendon Reflexes: 1+ and symmetric bilateral biceps, brachioradialis and patellae Cerebellar: No ataxia with FNF bilaterally, but slow. No asymmetry.  Gait: Walks slowly with a stooped gait. Mild difficulty with turns and with seating herself back in bed. No asymmetry of stride length.     Lab Results: Basic Metabolic Panel: Recent Labs  Lab 10/20/22 1747  NA 138  K 3.6  CL 102  CO2 23  GLUCOSE 111*  BUN 10  CREATININE 1.13*  CALCIUM 9.2    CBC: Recent Labs  Lab 10/20/22 1747  WBC 4.5  HGB 14.6  HCT 44.2  MCV 93.1  PLT 156    Cardiac Enzymes: No results for input(s): "CKTOTAL", "CKMB", "CKMBINDEX", "TROPONINI" in the last 168 hours.  Lipid Panel: No results for input(s): "CHOL", "TRIG", "HDL", "CHOLHDL", "VLDL", "LDLCALC" in the last 168 hours.  Imaging: DG Chest Portable 1 View  Result Date: 10/21/2022 CLINICAL DATA:  88 year old female with history of altered mental status. COVID infection. EXAM: PORTABLE CHEST 1 VIEW COMPARISON:  Chest x-ray 11/14/2021. FINDINGS: Patchy areas of airspace consolidation are noted in the left base and medial aspect of the left upper lobe. No definite pleural effusions. Right lung is clear. No pneumothorax. No evidence of  pulmonary edema. Heart size is borderline enlarged. Upper mediastinal contours are within normal limits. Left-sided pacemaker device in place with lead tips projecting over the expected location of the right atrium and right ventricle. Status post median sternotomy for CABG and aortic valve replacement with what appears to be a stented bioprosthesis. IMPRESSION: 1. Patchy airspace consolidation in the left lung, concerning for multilobar bronchopneumonia. Followup PA and lateral chest X-ray is recommended in 3-4 weeks following trial of antibiotic therapy to ensure resolution and exclude underlying malignancy. Electronically Signed   By: Trudie Reed M.D.   On: 10/21/2022 07:15   CT ABDOMEN PELVIS W CONTRAST  Result Date: 10/21/2022 CLINICAL DATA:  88 year old female with history of altered mental status, weakness and abdominal pain. EXAM: CT ABDOMEN AND PELVIS WITH CONTRAST TECHNIQUE: Multidetector CT imaging of the abdomen and pelvis was performed using the standard protocol following bolus administration of intravenous contrast. RADIATION DOSE REDUCTION: This exam was performed according to the departmental dose-optimization program which includes automated exposure control, adjustment of the mA and/or kV according to patient size and/or use of iterative reconstruction technique. CONTRAST:  25mL OMNIPAQUE IOHEXOL 300 MG/ML  SOLN COMPARISON:  CT of the abdomen and pelvis 11/14/2021. FINDINGS: Lower chest: Large hiatal hernia. Status post median sternotomy for aortic valve replacement with a mechanical aortic valve. Pacemaker leads in the right atrium and right ventricle. Mild  scarring in the lung bases bilaterally, most evident in the left lower lobe. 3 mm right middle lobe pulmonary nodule (axial image 16 of series 4), unchanged, likely benign. Hepatobiliary: Subcentimeter low-attenuation lesion in segment 2 of the liver (axial image 16 of series 2), stable compared to the prior study, too small to  definitively characterize, but statistically likely to represent a tiny cyst. No other suspicious appearing hepatic lesions. 6 mm calcified gallstone lying dependently in the gallbladder. Gallbladder is not distended. No pericholecystic fluid or surrounding inflammatory changes. No intra or extrahepatic biliary ductal dilatation. Pancreas: 7 mm low-attenuation lesion in the superior aspect of the body of the pancreas (axial image 23 of series 2). No solid pancreatic mass. No pancreatic ductal dilatation. No peripancreatic fluid collections or inflammatory changes. Spleen: Unremarkable. Adrenals/Urinary Tract: Right kidney and bilateral adrenal glands are normal in appearance. Subcentimeter low-attenuation lesions in the left kidney, too small to definitively characterize, but similar to prior study and statistically likely to represent tiny cysts (no imaging follow-up recommended). No hydroureteronephrosis. Urinary bladder is partially obscured by beam hardening artifact from the patient's right hip arthroplasty, but visualized portions are unremarkable. Stomach/Bowel: The appearance of the intra-abdominal portion of the stomach is unremarkable. No pathologic dilatation of small bowel or colon. The appendix is not confidently identified and may be surgically absent. Regardless, there are no inflammatory changes noted adjacent to the cecum to suggest the presence of an acute appendicitis at this time. Vascular/Lymphatic: Aortic atherosclerosis with fusiform aneurysmal dilatation of the infrarenal abdominal aorta which measures up to 3.8 cm in diameter (previously 3.6 cm). No lymphadenopathy noted in the abdomen or pelvis. Reproductive: Status post hysterectomy. Ovaries are not confidently identified may be surgically absent or atrophic. Other: No significant volume of ascites.  No pneumoperitoneum. Musculoskeletal: Status post right hip arthroplasty. There are no aggressive appearing lytic or blastic lesions noted in  the visualized portions of the skeleton. IMPRESSION: 1. No definite acute findings noted in the abdomen or pelvis to account for the patient's symptoms. 2. Cholelithiasis without evidence of acute cholecystitis. 3. 7 mm low-attenuation lesion in the superior aspect of the body of the pancreas, nonspecific, but statistically likely a benign pancreatic pseudocyst or side branch IPMN (intraductal papillary mucinous neoplasm). Follow-up abdominal MRI with and without IV gadolinium with MRCP or pancreatic protocol CT scan is recommended in 2 years to ensure the stability of this finding. This recommendation follows ACR consensus guidelines: Management of Incidental Pancreatic Cysts: A White Paper of the ACR Incidental Findings Committee. J Am Coll Radiol 2017;14:911-923. 4. Mild enlargement of fusiform infrarenal abdominal aortic aneurysm which currently measures 3.8 cm in diameter (previously 3.6 cm). Recommend follow-up ultrasound every 2 years. This recommendation follows ACR consensus guidelines: White Paper of the ACR Incidental Findings Committee II on Vascular Findings. J Am Coll Radiol 2013; 10:789-794. 5. Large hiatal hernia. 6. Additional incidental findings, as above. Electronically Signed   By: Trudie Reed M.D.   On: 10/21/2022 07:00   CT HEAD WO CONTRAST ( )  Result Date: 10/20/2022 CLINICAL DATA:  TIA, altered mental status.  Slurred speech. EXAM: CT HEAD WITHOUT CONTRAST TECHNIQUE: Contiguous axial images were obtained from the base of the skull through the vertex without intravenous contrast. RADIATION DOSE REDUCTION: This exam was performed according to the departmental dose-optimization program which includes automated exposure control, adjustment of the mA and/or kV according to patient size and/or use of iterative reconstruction technique. COMPARISON:  11/14/2021 FINDINGS: Brain: There is atrophy and chronic small  vessel disease changes. No acute intracranial abnormality. Specifically, no  hemorrhage, hydrocephalus, mass lesion, acute infarction, or significant intracranial injury. Vascular: No hyperdense vessel or unexpected calcification. Skull: No acute calvarial abnormality. Sinuses/Orbits: No acute findings Other: None IMPRESSION: Atrophy, chronic microvascular disease. No acute intracranial abnormality. Electronically Signed   By: Charlett Nose M.D.   On: 10/20/2022 18:14     Assessment: 88 year old female with a PMHx of Alzheimer's dementia presenting to the ED with with AMS and weakness for the past 4 days PTA. She was noted to have an episode of slurred speech and difficulty ambulating, with dragging of her left leg per daughter. At the time of presentation, the patient could not recognize her daughter as clearly as before. Patient has had difficulty speaking and has had a stuttering quality to her speech. Also has had a cough. Covid PCR was positive.  - Her fluctuating symptoms have improved and she is now back to baseline, with symmetric gait and fluent speech with poverty of thought and some comprehension deficits, consistent with her diagnosis of Alzheimer's dementia.  - CT head: There is atrophy and chronic small vessel disease changes. No acute intracranial abnormality. Specifically, no hemorrhage, hydrocephalus, mass lesion, acute infarction, or significant intracranial injury. - Overall presentation felt most likely to be secondary to exacerbation of pre-existing deficits due to newly diagnosed Covid infection in this patient with decreased neurological reserve. Stroke is unlikely.   Recommendations: - Plavix has been added to ASA. Given that it does not appear that she has had a stroke, can discontinue Plavix.  - Encourage PO hydration.  - PT/OT - If continues to be back to baseline tomorrow, consider discharge back to her assisted living facility.     Electronically signed: Dr. Caryl Pina 10/21/2022, 9:35 AM

## 2022-10-21 NOTE — ED Notes (Signed)
OT at bedside. When OT done if pt still agitated and trying to get out of bed alone will give haldol.

## 2022-10-21 NOTE — H&P (Signed)
History and Physical    [name] QIH:[id number] DOB: [birth year] DOA: 10/21/2022  Referring MD/NP/PA:   PCP: Doreene Nest, NP   Patient coming from:  The patient is coming from ALF    Chief Complaint: AMS and cough  HPI: [name] is a 88 y.o. female with medical history significant of pacemaker placement, HTN, HLD, asthma, CAD, CABG, stroke, hypothyroidism, CKD-3A, left bundle blockade, s/p of AVR, dementia, who presents with altered mental status and cough.    Per her daughter, patient has been confused in the past 4 days.  At her normal baseline, patient recognizes family members, but usually not orientated to place and time.  Currently patient does not recognize her daughter as clear as before.  Patient has difficulty speaking and with stuttering.  She seems to have left leg weakness since patient has been dragging her left leg.  Patient has dry cough in the past 4 days, no chest pain, fever or chills.  Patient does not have nausea vomiting, diarrhea abdominal pain per her daughter.   Data reviewed independently and ED Course: pt was found to have positive Covid19, WBC 4.5, stable renal function, temperature 99.4, blood pressure 127/66, heart rate 93, RR 18, oxygen saturation 93-100% on room air.  CT head negative for acute intracranial abnormalities.  Patient is placed on telemetry bed for patient.  Dr. Otelia Limes of neurology is consulted.  CXR 1. Patchy airspace consolidation in the left lung, concerning for multilobar bronchopneumonia. Followup PA and lateral chest X-ray is recommended in 3-4 weeks following trial of antibiotic therapy to ensure resolution and exclude underlying malignancy  CT-abd/pelvis 1. No definite acute findings noted in the abdomen or pelvis to account for the patient's symptoms. 2. Cholelithiasis without evidence of acute cholecystitis. 3. 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 Follow-up abdominal MRI with and without IV gadolinium with MRCP or pancreatic protocol CT scan is recommended in 2 years to ensure the stability of this finding. This recommendation follows ACR consensus guidelines: Management of Incidental Pancreatic Cysts: A White Paper of the ACR Incidental Findings Committee. J Am Coll Radiol 2017;14:911-923. 4. Mild enlargement of fusiform infrarenal abdominal aortic aneurysm which currently measures 3.8 cm in diameter (previously 3.6 cm). Recommend follow-up ultrasound every 2 years. This recommendation follows ACR consensus guidelines: White Paper of the ACR Incidental Findings Committee II on Vascular Findings. J Am Coll Radiol 2013; 10:789-794. 5. Large hiatal hernia. 6. Additional incidental findings, as above.   EKG: I have personally reviewed.  Sinus rhythm, QTc 499, no pacemaker marker noted, bifascicular block, poor R wave progression   Review of Systems: Could not be reviewed due to dementia and altered mental status.     Allergy:  Allergies  Allergen Reactions   Codeine Nausea And Vomiting and Other (See Comments)    Patient gets violently ill   Morphine And Related Nausea And Vomiting and Other (See Comments)    Patient get violently ill    Past Medical History:  Diagnosis Date   AAA (abdominal aortic aneurysm) (HCC) 01/03/13   3.5x3.3cm   Alzheimer's dementia (HCC)    "probably middle stage" (11/05/2014)   Anemia    "hx of chronic" (11/05/2014)   Aortic stenosis 03/29/2008   biological prosthetic replacement   Arthritis    "joints" (11/05/2014)   Asthma    Avascular necrosis of  bone of hip, right (HCC) 07/28/2018   Chronic asthmatic bronchitis    "q fall and winter" (11/05/2014)   Closed nondisplaced fracture of middle phalanx of lesser toe of right foot 01/05/2021   Coronary artery disease    CABG 03/29/08    Dyslipidemia    Exertional shortness of breath    Family history of adverse reaction to anesthesia    "daughter gets PONV too"   Heart murmur    Hypertension    Hypothyroidism    LBBB (left bundle branch block)    Migraines    "none in years' (11/05/2014)   Mobitz type 2 second degree AV block 10/31/2014   MVP (mitral valve prolapse)    with mild mitral insufficiency/notes 08/17/2013   Near syncope 08/17/2013   PONV (postoperative nausea and vomiting)    Presence of permanent cardiac pacemaker    Sinus pause 10/31/2014   Stroke (HCC) 03/2008   "2 light strokes"; denies residual on 11/05/2014   Urinary frequency     Past Surgical History:  Procedure Laterality Date   ABDOMINAL HYSTERECTOMY  1977   AORTIC VALVE REPLACEMENT  03/29/2008   PERICARDIAL TISSUE VALVE   APPENDECTOMY  ?1977   CARDIAC CATHETERIZATION  ~ 2009   CATARACT EXTRACTION W/ INTRAOCULAR LENS IMPLANT Left    CORONARY ARTERY BYPASS GRAFT  03/29/2008   LIMA TO LAD,SVG TO CX,SVG TO LEFT POSTEROLATERAL BRANCH   HIP FRACTURE SURGERY Right 06/2010   "3 metal screws"    INSERT / REPLACE / REMOVE PACEMAKER  11/05/2014   LOOP RECORDER EXPLANT N/A 11/05/2014   Procedure: LOOP RECORDER EXPLANT;  Surgeon: [name], MD;  Location: [facility];  Service: Cardiovascular;  Laterality: N/A;   LOOP RECORDER IMPLANT N/A 09/17/2013   Procedure: LOOP RECORDER IMPLANT;  Surgeon: [name], MD;  Location: [facility];  Service: Cardiovascular;  Laterality: N/A;   PERMANENT PACEMAKER INSERTION N/A 11/05/2014   Procedure: PERMANENT PACEMAKER INSERTION;  Surgeon: [name], MD;  Location: [facility];  Service: Cardiovascular;  Laterality: N/A;   REFRACTIVE SURGERY Bilateral    /notes 04/28/2008 (08/17/2013)   RETINAL DETACHMENT SURGERY Left 1994   [name] 04/10/2008 (08/17/2013)   TIBIAL TUBERCLERPLASTY  11/05/2014   tooth pulled   06/2018   TOTAL HIP ARTHROPLASTY Right 07/31/2018   Procedure: RIGHT TOTAL HIP ARTHROPLASTY ANTERIOR  APPROACH;  Surgeon: Gean Birchwood, MD;  Location: [facility];  Service: Orthopedics;  Laterality: Right;   UTERINE FIBROID SURGERY  1960's    Social History:  reports that she has never smoked. She has never used smokeless tobacco. She reports that she does not drink alcohol and does not use drugs.  Family History:  Family History  Problem Relation Age of Onset   Heart failure Mother    Diabetes Mother    Hypertension Mother    Hyperlipidemia Mother    Melanoma Grandchild    Colon cancer Neg Hx      Prior to Admission medications   Medication Sig Start Date End Date Taking? Authorizing Provider  alendronate (FOSAMAX) 70 MG tablet Take 1 tablet (70 mg total) by mouth once a week. No food or other medications for 30 min. Avoid laying flat for two hours. 04/19/22   Doreene Nest, NP  amoxicillin (AMOXIL) 500 MG tablet Take 2,000 mg by mouth See admin instructions. Take 2,000mg  by mouth 1 hour prior to dental appointments Patient not taking: Reported on 08/06/2022 12/10/15   [provider]  aspirin EC (ASPIRIN LOW  DOSE) 81 MG tablet Take 1 tablet (81 mg total) by mouth daily. 09/19/22   Doreene Nest, NP  atorvastatin (LIPITOR) 40 MG tablet Take 1 tablet (40 mg total) by mouth daily. For cholesterol. 09/09/21   Doreene Nest, NP  brimonidine (ALPHAGAN) 0.2 % ophthalmic solution Place 1 drop into the left eye 2 (two) times daily. 07/10/18   [provider]  Calcium Carbonate-Vit D-Min (CALTRATE 600+D PLUS MINERALS) 600-800 MG-UNIT TABS Take 1 tablet by mouth twice daily for calcium and vitamin D. 09/30/22   Doreene Nest, NP  fluticasone (FLONASE) 50 MCG/ACT nasal spray PLACE 1 SPRAY INTO BOTH NOSTRILS DAILY 09/19/22   Doreene Nest, NP  fluticasone-salmeterol (ADVAIR) 250-50 MCG/ACT AEPB INHALE 1 PUFF INTO THE LUNGS 2 TIMES DAILY FOR COPD. RINSE MOUTH AFTER USE 10/21/21   Doreene Nest, NP  furosemide (LASIX) 40 MG tablet TAKE 1 TABLET BY MOUTH DAILY  10/13/22   Croitoru, Mihai, MD  levothyroxine (SYNTHROID) 50 MCG tablet TAKE 1 TABLET BY MOUTH EVERY MORNING ON AN EMPTY STOMACH WITH WATER ONLY. NO FOOD OR OTHER MEDICATIONS FOR 30 MINS. 09/05/22   Doreene Nest, NP  losartan (COZAAR) 100 MG tablet TAKE 1 TABLET BY MOUTH DAILY FOR BLOOD PRESSURE 10/14/22   Doreene Nest, NP  potassium chloride (KLOR-CON) 10 MEQ tablet Take 2 tablets (20 mEq total) by mouth 2 (two) times daily. 10/05/21   Croitoru, Rachelle Hora, MD    Physical Exam: Vitals:   10/21/22 1415 10/21/22 1500 10/21/22 1529 10/21/22 1727  BP:    (!) 148/70  Pulse: 81 61  60  Resp:    16  Temp:    98 F (36.7 C)  TempSrc:    Oral  SpO2:  99% 99% 99%  Weight:      Height:       General: Not in acute distress HEENT:       Eyes: PERRL, EOMI, no scleral icterus.       ENT: No discharge from the ears and nose       Neck: No JVD, no bruit, no mass felt. Heme: No neck lymph node enlargement. Cardiac: S1/S2, RRR, No murmurs, No gallops or rubs. Respiratory: No rales, wheezing, rhonchi or rubs. GI: Soft, nondistended, nontender, no organomegaly, BS present. GU: No hematuria Ext: No pitting leg edema bilaterally. 1+DP/PT pulse bilaterally. Musculoskeletal: No joint deformities, No joint redness or warmth, no limitation of ROM in spin. Skin: No rashes.  Neuro: Confused, not oriented oriented X3, cranial nerves II-XII grossly intact, Muscle strength 5/5 in all extremities  Psych: Patient is not psychotic, no suicidal or hemocidal ideation.  Labs on Admission: I have personally reviewed following labs and imaging studies  CBC: Recent Labs  Lab 10/20/22 1747  WBC 4.5  HGB 14.6  HCT 44.2  MCV 93.1  PLT 156   Basic Metabolic Panel: Recent Labs  Lab 10/20/22 1747  NA 138  K 3.6  CL 102  CO2 23  GLUCOSE 111*  BUN 10  CREATININE 1.13*  CALCIUM 9.2   GFR: Estimated Creatinine Clearance: 37.3 mL/min (A) (by C-G formula based on SCr of 1.13 mg/dL (H)). Liver  Function Tests: Recent Labs  Lab 10/20/22 1747  AST 24  ALT 9  ALKPHOS 88  BILITOT 1.6*  PROT 7.5  ALBUMIN 4.2   No results for input(s): "LIPASE", "AMYLASE" in the last 168 hours. No results for input(s): "AMMONIA" in the last 168 hours. Coagulation Profile: No results for input(s): "  INR", "PROTIME" in the last 168 hours. Cardiac Enzymes: No results for input(s): "CKTOTAL", "CKMB", "CKMBINDEX", "TROPONINI" in the last 168 hours. BNP (last 3 results) No results for input(s): "PROBNP" in the last 8760 hours. HbA1C: No results for input(s): "HGBA1C" in the last 72 hours. CBG: Recent Labs  Lab 10/20/22 1756  GLUCAP 93   Lipid Profile: No results for input(s): "CHOL", "HDL", "LDLCALC", "TRIG", "CHOLHDL", "LDLDIRECT" in the last 72 hours. Thyroid Function Tests: No results for input(s): "TSH", "T4TOTAL", "FREET4", "T3FREE", "THYROIDAB" in the last 72 hours. Anemia Panel: No results for input(s): "VITAMINB12", "FOLATE", "FERRITIN", "TIBC", "IRON", "RETICCTPCT" in the last 72 hours. Urine analysis:    Component Value Date/Time   COLORURINE YELLOW (A) 10/20/2022 2037   APPEARANCEUR CLEAR (A) 10/20/2022 2037   LABSPEC 1.026 10/20/2022 2037   PHURINE 6.0 10/20/2022 2037   GLUCOSEU NEGATIVE 10/20/2022 2037   HGBUR NEGATIVE 10/20/2022 2037   BILIRUBINUR NEGATIVE 10/20/2022 2037   BILIRUBINUR neg 08/13/2022 1509   KETONESUR 5 (A) 10/20/2022 2037   PROTEINUR 30 (A) 10/20/2022 2037   UROBILINOGEN 0.2 08/13/2022 1509   UROBILINOGEN 0.2 08/17/2013 1552   NITRITE NEGATIVE 10/20/2022 2037   LEUKOCYTESUR NEGATIVE 10/20/2022 2037   Sepsis Labs: @LABRCNTIP (procalcitonin:4,lacticidven:4) ) Recent Results (from the past 240 hour(s))  Resp panel by RT-PCR (RSV, Flu A&B, Covid) Anterior Nasal Swab     Status: Abnormal   Collection Time: 10/21/22  3:46 AM   Specimen: Anterior Nasal Swab  Result Value Ref Range Status   SARS Coronavirus 2 by RT PCR POSITIVE (A) NEGATIVE Final     Comment: (NOTE) SARS-CoV-2 target nucleic acids are DETECTED.  The SARS-CoV-2 RNA is generally detectable in upper respiratory specimens during the acute phase of infection. Positive results are indicative of the presence of the identified virus, but do not rule out bacterial infection or co-infection with other pathogens not detected by the test. Clinical correlation with patient history and other diagnostic information is necessary to determine patient infection status. The expected result is Negative.  Fact Sheet for Patients: 10/23/22  Fact Sheet for Healthcare Providers: BloggerCourse.com  This test is not yet approved or cleared by the [place] FDA and  has been authorized for detection and/or diagnosis of SARS-CoV-2 by FDA under an Emergency Use Authorization (EUA).  This EUA will remain in effect (meaning this test can be used) for the duration of  the COVID-19 declaration under Section 564(b)(1) of the A ct, 21 U.S.C. section 360bbb-3(b)(1), unless the authorization is terminated or revoked sooner.     Influenza A by PCR NEGATIVE NEGATIVE Final   Influenza B by PCR NEGATIVE NEGATIVE Final    Comment: (NOTE) The Xpert Xpress SARS-CoV-2/FLU/RSV plus assay is intended as an aid in the diagnosis of influenza from Nasopharyngeal swab specimens and should not be used as a sole basis for treatment. Nasal washings and aspirates are unacceptable for Xpert Xpress SARS-CoV-2/FLU/RSV testing.  Fact Sheet for Patients: [place]  Fact Sheet for Healthcare Providers: BloggerCourse.com  This test is not yet approved or cleared by the [employer] FDA and has been authorized for detection and/or diagnosis of SARS-CoV-2 by FDA under an Emergency Use Authorization (EUA). This EUA will remain in effect (meaning this test can be used) for the duration of  the COVID-19 declaration under Section 564(b)(1) of the Act, 21 U.S.C. section 360bbb-3(b)(1), unless the authorization is terminated or revoked.     Resp Syncytial Virus by PCR NEGATIVE NEGATIVE Final    Comment: (NOTE) Fact  Sheet for Patients: BloggerCourse.com  Fact Sheet for Healthcare Providers: [employer]it  This test is not yet approved or cleared by the [place] FDA and has been authorized for detection and/or diagnosis of SARS-CoV-2 by FDA under an Emergency Use Authorization (EUA). This EUA will remain in effect (meaning this test can be used) for the duration of the COVID-19 declaration under Section 564(b)(1) of the Act, 21 U.S.C. section 360bbb-3(b)(1), unless the authorization is terminated or revoked.  Performed at [facility] ([place] [address] [place], [place] [address]      Radiological Exams on Admission: DG HIP UNILAT WITH PELVIS 2-3 VIEWS LEFT  Result Date: 10/21/2022 CLINICAL DATA:  Left hip pain. EXAM: DG HIP (WITH OR WITHOUT PELVIS) 2-3V LEFT COMPARISON:  03/02/2022 FINDINGS: Total right hip arthroplasty components appear well seated without complicating features. Pubic symphysis and SI joints are intact. No pelvic fractures or bone lesions are identified. Left hip is normally located. No significant degenerative changes. No left hip fracture or AVN. IMPRESSION: Intact left hip. No fracture or AVN. No significant degenerative changes. Electronically Signed   By: Rudie Meyer M.D.   On: 10/21/2022 10:49   DG Chest Portable 1 View  Result Date: 10/21/2022 CLINICAL DATA:  88 year old female with history of altered mental status. COVID infection. EXAM: PORTABLE CHEST 1 VIEW COMPARISON:  Chest x-ray 11/14/2021. FINDINGS: Patchy areas of airspace consolidation are noted in the left base and medial aspect of the left upper lobe. No definite pleural effusions. Right lung is clear. No pneumothorax.  No evidence of pulmonary edema. Heart size is borderline enlarged. Upper mediastinal contours are within normal limits. Left-sided pacemaker device in place with lead tips projecting over the expected location of the right atrium and right ventricle. Status post median sternotomy for CABG and aortic valve replacement with what appears to be a stented bioprosthesis. IMPRESSION: 1. Patchy airspace consolidation in the left lung, concerning for multilobar bronchopneumonia. Followup PA and lateral chest X-ray is recommended in 3-4 weeks following trial of antibiotic therapy to ensure resolution and exclude underlying malignancy. Electronically Signed   By: Trudie Reed M.D.   On: 10/21/2022 07:15   CT ABDOMEN PELVIS W CONTRAST  Result Date: 10/21/2022 CLINICAL DATA:  88 year old female with history of altered mental status, weakness and abdominal pain. EXAM: CT ABDOMEN AND PELVIS WITH CONTRAST TECHNIQUE: Multidetector CT imaging of the abdomen and pelvis was performed using the standard protocol following bolus administration of intravenous contrast. RADIATION DOSE REDUCTION: This exam was performed according to the departmental dose-optimization program which includes automated exposure control, adjustment of the mA and/or kV according to patient size and/or use of iterative reconstruction technique. CONTRAST:  80mL OMNIPAQUE IOHEXOL 300 MG/ML  SOLN COMPARISON:  CT of the abdomen and pelvis 11/14/2021. FINDINGS: Lower chest: Large hiatal hernia. Status post median sternotomy for aortic valve replacement with a mechanical aortic valve. Pacemaker leads in the right atrium and right ventricle. Mild scarring in the lung bases bilaterally, most evident in the left lower lobe. 3 mm right middle lobe pulmonary nodule (axial image 16 of series 4), unchanged, likely benign. Hepatobiliary: Subcentimeter low-attenuation lesion in segment 2 of the liver (axial image 16 of series 2), stable compared to the prior study, too  small to definitively characterize, but statistically likely to represent a tiny cyst. No other suspicious appearing hepatic lesions. 6 mm calcified gallstone lying dependently in the gallbladder. Gallbladder is not distended. No pericholecystic fluid or surrounding inflammatory changes. No intra or extrahepatic biliary  ductal dilatation. Pancreas: 7 mm low-attenuation lesion in the superior aspect of the body of the pancreas (axial image 23 of series 2). No solid pancreatic mass. No pancreatic ductal dilatation. No peripancreatic fluid collections or inflammatory changes. Spleen: Unremarkable. Adrenals/Urinary Tract: Right kidney and bilateral adrenal glands are normal in appearance. Subcentimeter low-attenuation lesions in the left kidney, too small to definitively characterize, but similar to prior study and statistically likely to represent tiny cysts (no imaging follow-up recommended). No hydroureteronephrosis. Urinary bladder is partially obscured by beam hardening artifact from the patient's right hip arthroplasty, but visualized portions are unremarkable. Stomach/Bowel: The appearance of the intra-abdominal portion of the stomach is unremarkable. No pathologic dilatation of small bowel or colon. The appendix is not confidently identified and may be surgically absent. Regardless, there are no inflammatory changes noted adjacent to the cecum to suggest the presence of an acute appendicitis at this time. Vascular/Lymphatic: Aortic atherosclerosis with fusiform aneurysmal dilatation of the infrarenal abdominal aorta which measures up to 3.8 cm in diameter (previously 3.6 cm). No lymphadenopathy noted in the abdomen or pelvis. Reproductive: Status post hysterectomy. Ovaries are not confidently identified may be surgically absent or atrophic. Other: No significant volume of ascites.  No pneumoperitoneum. Musculoskeletal: Status post right hip arthroplasty. There are no aggressive appearing lytic or blastic lesions  noted in the visualized portions of the skeleton. IMPRESSION: 1. No definite acute findings noted in the abdomen or pelvis to account for the patient's symptoms. 2. Cholelithiasis without evidence of acute cholecystitis. 3. 7 mm low-attenuation lesion in the superior aspect of the body of the pancreas, nonspecific, but statistically likely a benign pancreatic pseudocyst or side branch IPMN (intraductal papillary mucinous neoplasm). Follow-up abdominal MRI with and without IV gadolinium with MRCP or pancreatic protocol CT scan is recommended in 2 years to ensure the stability of this finding. This recommendation follows ACR consensus guidelines: Management of Incidental Pancreatic Cysts: A White Paper of the ACR Incidental Findings Committee. J Am Coll Radiol 2017;14:911-923. 4. Mild enlargement of fusiform infrarenal abdominal aortic aneurysm which currently measures 3.8 cm in diameter (previously 3.6 cm). Recommend follow-up ultrasound every 2 years. This recommendation follows ACR consensus guidelines: White Paper of the ACR Incidental Findings Committee II on Vascular Findings. J Am Coll Radiol 2013; 10:789-794. 5. Large hiatal hernia. 6. Additional incidental findings, as above. Electronically Signed   By: Trudie Reed M.D.   On: 10/21/2022 07:00   CT HEAD WO CONTRAST ( )  Result Date: 10/20/2022 CLINICAL DATA:  TIA, altered mental status.  Slurred speech. EXAM: CT HEAD WITHOUT CONTRAST TECHNIQUE: Contiguous axial images were obtained from the base of the skull through the vertex without intravenous contrast. RADIATION DOSE REDUCTION: This exam was performed according to the departmental dose-optimization program which includes automated exposure control, adjustment of the mA and/or kV according to patient size and/or use of iterative reconstruction technique. COMPARISON:  11/14/2021 FINDINGS: Brain: There is atrophy and chronic small vessel disease changes. No acute intracranial abnormality.  Specifically, no hemorrhage, hydrocephalus, mass lesion, acute infarction, or significant intracranial injury. Vascular: No hyperdense vessel or unexpected calcification. Skull: No acute calvarial abnormality. Sinuses/Orbits: No acute findings Other: None IMPRESSION: Atrophy, chronic microvascular disease. No acute intracranial abnormality. Electronically Signed   By: Charlett Nose M.D.   On: 10/20/2022 18:14      Assessment/Plan Principal Problem:   Acute metabolic encephalopathy Active Problems:   Stroke [facility])   COVID-19 virus infection   CAD (coronary artery disease) s/p CABG - 2009 (LIMA  to LAD, SVG to LCX, SVG to L-PLV   Myocardial injury   HTN (hypertension)   Hypothyroidism   Chronic kidney disease, stage 3a (HCC)   HLD (hyperlipidemia)   Asthma   Pancreatic lesion   Assessment and Plan:  Acute metabolic encephalopathy: Etiology is not clear.  CT head negative.  Consulted Dr. Otelia Limes of neurology, who does not think patient has a stroke.  -Placed on telemetry bed for observation -Frequent neurocheck -Fall precaution -PT/OT -Continue aspirin -Discontinue Plavix which was ordered on admission  Hx of Stroke (HCC) -ASA and Lipitor  COVID-19 virus infection: Oxygen saturation 93-100% on room air. -Paxlovid -As needed albuterol and Mucinex  Myocardial injury and history of CAD (coronary artery disease) s/p CABG - 2009 (LIMA to LAD, SVG to LCX, SVG to L-PLV: Troponin level 18 --> 18. -Aspirin, Lipitor  HTN (hypertension) -IV hydralazine as needed -Cozaar  Hypothyroidism -Synthroid  Chronic kidney disease, stage 3a (HCC): Stable -f/u by BMP  HLD (hyperlipidemia) -Lipitor  Asthma -As needed albuterol  Pancreatic lesion: This is an incidental finding on CT scan -Need to follow-up with PCP for outpatient follow-up     DVT ppx: SQ Lovenox  Code Status: DNR per her daughter  Family Communication:  Yes, patient's daughter   at bed side.    Disposition  Plan:  Anticipate discharge back to previous environment, ALF   Consults called: Dr. Otelia Limes of neurology  Admission status and Level of care: Telemetry Medical:     for obs   Dispo: The patient is from: ALF              Anticipated d/c is to: ALF              Anticipated d/c date is: 1 day              Patient currently is not medically stable to d/c.    Severity of Illness:  The appropriate patient status for this patient is OBSERVATION. Observation status is judged to be reasonable and necessary in order to provide the required intensity of service to ensure the patient's safety. The patient's presenting symptoms, physical exam findings, and initial radiographic and laboratory data in the context of their medical condition is felt to place them at decreased risk for further clinical deterioration. Furthermore, it is anticipated that the patient will be medically stable for discharge from the hospital within 2 midnights of admission.        Date of Service 10/21/2022    Lorretta Harp [facility]   If 7PM-7AM, please contact night-coverage [email or web address] 10/21/2022, 7:34 PM

## 2022-10-21 NOTE — Evaluation (Signed)
Physical Therapy Evaluation Patient Details Name: [name] MRN: [id number] DOB: 1933/12/07 Today's Date: 10/21/2022  History of Present Illness  88 y/o female presented to ED on 10/20/22 for AMS and weakness. CT head negative. Tested positive for COVID. CXR showed L lung PNA. PMH: AAA, CAD, HTN, asthma, dementia, CKD, pacemaker, CVA  Clinical Impression  Patient admitted with the above. PTA, patient from [facility] where she is ambulatory with rollator and able to perform dressing on her own but requires direct instruction for other ADLs. Patient presents with weakness, impaired balance, and decreased activity tolerance. Impulsive throughout session. Able to complete sit to stand with min guard but requires minA for balance during step pivot transfer with HHAx1. Ambulated fwd/bwd repeatedly in room with rollator and min guard. L LE weakness seems to be resolved based on observation and daughter confirming. Patient will benefit from skilled PT services during acute stay to address listed deficits. Recommend HHPT at discharge to maximize functional independence and safety to reduce fall risk.        Recommendations for follow up therapy are one component of a multi-disciplinary discharge planning process, led by the attending physician.  Recommendations may be updated based on patient status, additional functional criteria and insurance authorization.  Follow Up Recommendations Home health PT (return to ALF)      Assistance Recommended at Discharge Frequent or constant Supervision/Assistance  Patient can return home with the following  A little help with walking and/or transfers;A little help with bathing/dressing/bathroom;Direct supervision/assist for medications management;Direct supervision/assist for financial management    Equipment Recommendations None recommended by PT  Recommendations for Other Services       Functional Status Assessment Patient has had a recent decline in  their functional status and demonstrates the ability to make significant improvements in function in a reasonable and predictable amount of time.     Precautions / Restrictions Precautions Precautions: Fall Restrictions Weight Bearing Restrictions: No      Mobility  Bed Mobility Overal bed mobility: Needs Assistance Bed Mobility: Sit to Supine, Supine to Sit     Supine to sit: Supervision Sit to supine: Supervision   General bed mobility comments: on arrival, patient impulsively coming to edge of stretcher. Able to return to supine wiht supervision    Transfers Overall transfer level: Needs assistance Equipment used: Rollator (4 wheels), 1 person hand held assist Transfers: Sit to/from Stand, Bed to chair/wheelchair/BSC Sit to Stand: Min guard   Step pivot transfers: Min assist       General transfer comment: min guard to come into standing. Assist required for balance with HHA for step pivot transfer to [facility]    Ambulation/Gait Ambulation/Gait assistance: Min guard Gait Distance (Feet): 10 Feet Assistive device: Rollator (4 wheels) Gait Pattern/deviations: Step-through pattern, Decreased stride length   Gait velocity interpretation: >2.62 ft/sec, indicative of community ambulatory   General Gait Details: able to take repeated fwd/bwd steps with rollator and no LOB. Min guard for safety as patient is impulsive.  Stairs            Wheelchair Mobility    Modified Rankin (Stroke Patients Only)       Balance Overall balance assessment: Needs assistance Sitting-balance support: No upper extremity supported, Feet supported Sitting balance-Leahy Scale: Good     Standing balance support: Bilateral upper extremity supported, Reliant on assistive device for balance Standing balance-Leahy Scale: Fair  Pertinent Vitals/Pain Pain Assessment Pain Assessment: Faces Faces Pain Scale: No hurt Pain Intervention(s):  Monitored during session    Home Living Family/patient expects to be discharged to:: Assisted living                 Home Equipment: Rollator (4 wheels) Additional Comments: from [facility]    Prior Function Prior Level of Function : Independent/Modified Independent             Mobility Comments: ambulates with rollator ADLs Comments: per daughter, patient able to get dressed on her own but requires direct instruction to bathe     Hand Dominance        Extremity/Trunk Assessment   Upper Extremity Assessment Upper Extremity Assessment: Defer to OT evaluation    Lower Extremity Assessment Lower Extremity Assessment: Generalized weakness (equal strength bilaterally but general weakness noted)    Cervical / Trunk Assessment Cervical / Trunk Assessment: Kyphotic  Communication   Communication: No difficulties  Cognition Arousal/Alertness: Awake/alert Behavior During Therapy: Impulsive Overall Cognitive Status: History of cognitive impairments - at baseline                                 General Comments: hx of dementia        General Comments General comments (skin integrity, edema, etc.): daughter present and provided information about PLOF. Active listening to daughter's frustration about current care    Exercises     Assessment/Plan    PT Assessment Patient needs continued PT services  PT Problem List Decreased strength;Decreased activity tolerance;Decreased balance;Decreased mobility;Decreased cognition;Decreased knowledge of use of DME;Decreased safety awareness;Decreased knowledge of precautions       PT Treatment Interventions DME instruction;Gait training;Functional mobility training;Therapeutic activities;Therapeutic exercise;Balance training;Patient/family education    PT Goals (Current goals can be found in the Care Plan section)  Acute Rehab PT Goals Patient Stated Goal: did not state PT Goal Formulation: With  family Time For Goal Achievement: 11/04/22 Potential to Achieve Goals: Good    Frequency Min 2X/week     Co-evaluation               AM-PAC PT "6 Clicks" Mobility  Outcome Measure Help needed turning from your back to your side while in a flat bed without using bedrails?: A Little Help needed moving from lying on your back to sitting on the side of a flat bed without using bedrails?: A Little Help needed moving to and from a bed to a chair (including a wheelchair)?: A Little Help needed standing up from a chair using your arms (e.g., wheelchair or bedside chair)?: A Little Help needed to walk in hospital room?: A Little Help needed climbing 3-5 steps with a railing? : A Lot 6 Click Score: 17    End of Session   Activity Tolerance: Patient tolerated treatment well Patient left: in bed;with call bell/phone within reach;with family/visitor present Nurse Communication: Mobility status PT Visit Diagnosis: Unsteadiness on feet (R26.81);Muscle weakness (generalized) (M62.81)    Time: 2683-4196 PT Time Calculation (min) (ACUTE ONLY): 32 min   Charges:   PT Evaluation $PT Eval Moderate Complexity: 1 Mod          Breindel Collier A. Dan Humphreys PT, DPT [facility] - Acute Rehabilitation Services   Nuriyah Hanline A Marrell Dicaprio 10/21/2022, 3:17 PM

## 2022-10-21 NOTE — ED Notes (Signed)
Attempted for 22g IV x1. Will use [place] as pt hard stick.

## 2022-10-21 NOTE — ED Provider Notes (Signed)
[facility] Provider Note    Event Date/Time   First MD Initiated Contact with Patient 10/21/22 [phone]     (approximate)   History   Altered Mental Status   HPI  [name] is a 88 y.o. female with history of dementia, asthma, CAD, hypertension, dyslipidemia, hypothyroidism, stroke, second-degree AV block status post pacemaker who presents to the emergency department with her daughter for concerns for altered mental status, speech changes, difficulty walking intermittently over the past few days.  Patient lives in assisted living facility.  She states that the patient's granddaughter noticed that she was "dragging her left leg".  No known falls.  When asked if she is having any pain patient states that she is always in pain and points to her right lower abdomen.  Daughter reports that she has had a previous right hip replacement.  Abdominal exam is benign.  No vomiting or diarrhea.  She has had appendectomy, hysterectomy.  No fevers, cough recently.   History provided by patient's daughter, level 5 caveat secondary to patient's dementia.    Past Medical History:  Diagnosis Date   AAA (abdominal aortic aneurysm) (HCC) 01/03/13   3.5x3.3cm   Alzheimer's dementia (HCC)    "probably middle stage" (11/05/2014)   Anemia    "hx of chronic" (11/05/2014)   Aortic stenosis 03/29/2008   biological prosthetic replacement   Arthritis    "joints" (11/05/2014)   Asthma    Avascular necrosis of bone of hip, right (HCC) 07/28/2018   Chronic asthmatic bronchitis    "q fall and winter" (11/05/2014)   Closed nondisplaced fracture of middle phalanx of lesser toe of right foot 01/05/2021   Coronary artery disease    CABG 03/29/08   Dyslipidemia    Exertional shortness of breath    Family history of adverse reaction to anesthesia    "daughter gets PONV too"   Heart murmur    Hypertension    Hypothyroidism    LBBB (left bundle branch block)    Migraines    "none in years'  (11/05/2014)   Mobitz type 2 second degree AV block 10/31/2014   MVP (mitral valve prolapse)    with mild mitral insufficiency/notes 08/17/2013   Near syncope 08/17/2013   PONV (postoperative nausea and vomiting)    Presence of permanent cardiac pacemaker    Sinus pause 10/31/2014   Stroke (HCC) 03/2008   "2 light strokes"; denies residual on 11/05/2014   Urinary frequency     Past Surgical History:  Procedure Laterality Date   ABDOMINAL HYSTERECTOMY  1977   AORTIC VALVE REPLACEMENT  03/29/2008   PERICARDIAL TISSUE VALVE   APPENDECTOMY  ?1977   CARDIAC CATHETERIZATION  ~ 2009   CATARACT EXTRACTION W/ INTRAOCULAR LENS IMPLANT Left    CORONARY ARTERY BYPASS GRAFT  03/29/2008   LIMA TO LAD,SVG TO CX,SVG TO LEFT POSTEROLATERAL BRANCH   HIP FRACTURE SURGERY Right 06/2010   "3 metal screws"    INSERT / REPLACE / REMOVE PACEMAKER  11/05/2014   LOOP RECORDER EXPLANT N/A 11/05/2014   Procedure: LOOP RECORDER EXPLANT;  Surgeon: [name], MD;  Location: [facility] CATH [facility];  Service: Cardiovascular;  Laterality: N/A;   LOOP RECORDER IMPLANT N/A 09/17/2013   Procedure: LOOP RECORDER IMPLANT;  Surgeon: [name], MD;  Location: [facility];  Service: Cardiovascular;  Laterality: N/A;   PERMANENT PACEMAKER INSERTION N/A 11/05/2014   Procedure: PERMANENT PACEMAKER INSERTION;  Surgeon: [name], MD;  Location: [facility] C[facility];  Service: Cardiovascular;  Laterality: N/A;   REFRACTIVE SURGERY Bilateral    /notes 04/28/2008 (08/17/2013)   RETINAL DETACHMENT SURGERY Left 1994   [name] 04/10/2008 (08/17/2013)   TIBIAL TUBERCLERPLASTY  11/05/2014   tooth pulled   06/2018   TOTAL HIP ARTHROPLASTY Right 07/31/2018   Procedure: RIGHT TOTAL HIP ARTHROPLASTY ANTERIOR APPROACH;  Surgeon: Gean Birchwood, MD;  Location: [facility];  Service: Orthopedics;  Laterality: Right;   UTERINE FIBROID SURGERY  1960's    MEDICATIONS:  Prior to Admission medications   Medication Sig Start Date End Date Taking? Authorizing Provider   alendronate (FOSAMAX) 70 MG tablet Take 1 tablet (70 mg total) by mouth once a week. No food or other medications for 30 min. Avoid laying flat for two hours. 04/19/22   Doreene Nest, NP  amoxicillin (AMOXIL) 500 MG tablet Take 2,000 mg by mouth See admin instructions. Take 2,000mg  by mouth 1 hour prior to dental appointments Patient not taking: Reported on 08/06/2022 12/10/15   [provider]  aspirin EC (ASPIRIN LOW DOSE) 81 MG tablet Take 1 tablet (81 mg total) by mouth daily. 09/19/22   Doreene Nest, NP  atorvastatin (LIPITOR) 40 MG tablet Take 1 tablet (40 mg total) by mouth daily. For cholesterol. 09/09/21   Doreene Nest, NP  brimonidine (ALPHAGAN) 0.2 % ophthalmic solution Place 1 drop into the left eye 2 (two) times daily. 07/10/18   [provider]  Calcium Carbonate-Vit D-Min (CALTRATE 600+D PLUS MINERALS) 600-800 MG-UNIT TABS Take 1 tablet by mouth twice daily for calcium and vitamin D. 09/30/22   Doreene Nest, NP  fluticasone (FLONASE) 50 MCG/ACT nasal spray PLACE 1 SPRAY INTO BOTH NOSTRILS DAILY 09/19/22   Doreene Nest, NP  fluticasone-salmeterol (ADVAIR) 250-50 MCG/ACT AEPB INHALE 1 PUFF INTO THE LUNGS 2 TIMES DAILY FOR COPD. RINSE MOUTH AFTER USE 10/21/21   Doreene Nest, NP  furosemide (LASIX) 40 MG tablet TAKE 1 TABLET BY MOUTH DAILY 10/13/22   Croitoru, Mihai, MD  levothyroxine (SYNTHROID) 50 MCG tablet TAKE 1 TABLET BY MOUTH EVERY MORNING ON AN EMPTY STOMACH WITH WATER ONLY. NO FOOD OR OTHER MEDICATIONS FOR 30 MINS. 09/05/22   Doreene Nest, NP  losartan (COZAAR) 100 MG tablet TAKE 1 TABLET BY MOUTH DAILY FOR BLOOD PRESSURE 10/14/22   Doreene Nest, NP  potassium chloride (KLOR-CON) 10 MEQ tablet Take 2 tablets (20 mEq total) by mouth 2 (two) times daily. 10/05/21   Croitoru, Rachelle Hora, MD    Physical Exam   Triage Vital Signs: ED Triage Vitals  Enc Vitals Group     BP 10/20/22 1744 114/78     Pulse Rate 10/20/22 1744  93     Resp 10/20/22 1744 18     Temp 10/20/22 1744 99.4 F (37.4 C)     Temp Source 10/20/22 1744 Oral     SpO2 10/20/22 1744 96 %     Weight 10/20/22 1743 156 lb 8.4 oz (71 kg)     Height 10/20/22 1743  (1.753 m)     Head Circumference --      Peak Flow --      Pain Score 10/20/22 1742 0     Pain Loc --      Pain Edu? --      Excl. in GC? --     Most recent vital signs: Vitals:   10/21/22 1529 10/21/22 1727  BP:  (!) 148/70  Pulse:  60  Resp:  16  Temp:  98 F (36.7 C)  SpO2: 99% 99%    CONSTITUTIONAL: Alert and oriented to person but not time, place or situation.  Elderly.  In no distress. HEAD: Normocephalic, atraumatic EYES: Conjunctivae clear, pupils appear equal, sclera nonicteric ENT: normal nose; moist mucous membranes NECK: Supple, normal ROM CARD: RRR; S1 and S2 appreciated; no murmurs, no clicks, no rubs, no gallops RESP: Normal chest excursion without splinting or tachypnea; breath sounds clear and equal bilaterally; no wheezes, no rhonchi, no rales, no hypoxia or respiratory distress, speaking full sentences ABD/GI: Normal bowel sounds; non-distended; soft, non-tender, no rebound, no guarding, no peritoneal signs BACK: The back appears normal EXT: Normal ROM in all joints; no deformity noted, no edema; no cyanosis SKIN: Normal color for age and race; warm; no rash on exposed skin NEURO: Moves all extremities equally, normal speech, able to lift both of her legs up off the bed past 90 degrees of flexion at the hip and no pronator drift, difficult to assess sensation due to Alzheimer's but appears to be intact, normal speech, no facial asymmetry, NIH stroke scale 0 other than some mild altered mental status which appears to be her baseline from dementia PSYCH: The patient's mood and manner are appropriate.   ED Results / Procedures / Treatments   LABS: (all labs ordered are listed, but only abnormal results are displayed) Labs Reviewed  RESP PANEL BY  RT-PCR (RSV, FLU A&B, COVID)  RVPGX2 - Abnormal; Notable for the following components:      Result Value   SARS Coronavirus 2 by RT PCR POSITIVE (*)    All other components within normal limits  COMPREHENSIVE METABOLIC PANEL - Abnormal; Notable for the following components:   Glucose, Bld 111 (*)    Creatinine, Ser 1.13 (*)    Total Bilirubin 1.6 (*)    GFR, Estimated 47 (*)    All other components within normal limits  URINALYSIS, ROUTINE W REFLEX MICROSCOPIC - Abnormal; Notable for the following components:   Color, Urine YELLOW (*)    APPearance CLEAR (*)    Ketones, ur 5 (*)    Protein, ur 30 (*)    All other components within normal limits  TROPONIN I (HIGH SENSITIVITY) - Abnormal; Notable for the following components:   Troponin I (High Sensitivity) 18 (*)    All other components within normal limits  TROPONIN I (HIGH SENSITIVITY) - Abnormal; Notable for the following components:   Troponin I (High Sensitivity) 18 (*)    All other components within normal limits  CBC  C-REACTIVE PROTEIN  HEMOGLOBIN A1C  LIPID PANEL  CBC WITH DIFFERENTIAL/PLATELET  COMPREHENSIVE METABOLIC PANEL  CBG MONITORING, ED     EKG:  EKG Interpretation  Date/Time:  Wednesday October 20 2022 17:50:06 EST Ventricular Rate:  93 PR Interval:  170 QRS Duration: 122 QT Interval:  402 QTC Calculation: 499 R Axis:   -64 Text Interpretation: Sinus rhythm with occasional Premature ventricular complexes and Premature atrial complexes Right bundle branch block Left anterior fascicular block  Bifascicular block  Abnormal ECG When compared with ECG of 06-Nov-2014 04:02, Sinus rhythm has replaced Electronic atrial pacemaker Vent. rate has increased BY  33 BPM (RBBB and left anterior fascicular block) is now Present Confirmed by Rochele Raring [phone]) on 10/21/2022 4:43:52 AM         RADIOLOGY: My personal review and interpretation of imaging: CT head unremarkable.  CT abdomen pelvis shows no acute  abnormality.  Chest x-ray shows pneumonia likely from  COVID-19.  I have personally reviewed all radiology reports.   DG HIP UNILAT WITH PELVIS 2-3 VIEWS LEFT  Result Date: 10/21/2022 CLINICAL DATA:  Left hip pain. EXAM: DG HIP (WITH OR WITHOUT PELVIS) 2-3V LEFT COMPARISON:  03/02/2022 FINDINGS: Total right hip arthroplasty components appear well seated without complicating features. Pubic symphysis and SI joints are intact. No pelvic fractures or bone lesions are identified. Left hip is normally located. No significant degenerative changes. No left hip fracture or AVN. IMPRESSION: Intact left hip. No fracture or AVN. No significant degenerative changes. Electronically Signed   By: Rudie Meyer M.D.   On: 10/21/2022 10:49   DG Chest Portable 1 View  Result Date: 10/21/2022 CLINICAL DATA:  88 year old female with history of altered mental status. COVID infection. EXAM: PORTABLE CHEST 1 VIEW COMPARISON:  Chest x-ray 11/14/2021. FINDINGS: Patchy areas of airspace consolidation are noted in the left base and medial aspect of the left upper lobe. No definite pleural effusions. Right lung is clear. No pneumothorax. No evidence of pulmonary edema. Heart size is borderline enlarged. Upper mediastinal contours are within normal limits. Left-sided pacemaker device in place with lead tips projecting over the expected location of the right atrium and right ventricle. Status post median sternotomy for CABG and aortic valve replacement with what appears to be a stented bioprosthesis. IMPRESSION: 1. Patchy airspace consolidation in the left lung, concerning for multilobar bronchopneumonia. Followup PA and lateral chest X-ray is recommended in 3-4 weeks following trial of antibiotic therapy to ensure resolution and exclude underlying malignancy. Electronically Signed   By: Trudie Reed M.D.   On: 10/21/2022 07:15   CT ABDOMEN PELVIS W CONTRAST  Result Date: 10/21/2022 CLINICAL DATA:  88 year old female with  history of altered mental status, weakness and abdominal pain. EXAM: CT ABDOMEN AND PELVIS WITH CONTRAST TECHNIQUE: Multidetector CT imaging of the abdomen and pelvis was performed using the standard protocol following bolus administration of intravenous contrast. RADIATION DOSE REDUCTION: This exam was performed according to the departmental dose-optimization program which includes automated exposure control, adjustment of the mA and/or kV according to patient size and/or use of iterative reconstruction technique. CONTRAST:  35mL OMNIPAQUE IOHEXOL 300 MG/ML  SOLN COMPARISON:  CT of the abdomen and pelvis 11/14/2021. FINDINGS: Lower chest: Large hiatal hernia. Status post median sternotomy for aortic valve replacement with a mechanical aortic valve. Pacemaker leads in the right atrium and right ventricle. Mild scarring in the lung bases bilaterally, most evident in the left lower lobe. 3 mm right middle lobe pulmonary nodule (axial image 16 of series 4), unchanged, likely benign. Hepatobiliary: Subcentimeter low-attenuation lesion in segment 2 of the liver (axial image 16 of series 2), stable compared to the prior study, too small to definitively characterize, but statistically likely to represent a tiny cyst. No other suspicious appearing hepatic lesions. 6 mm calcified gallstone lying dependently in the gallbladder. Gallbladder is not distended. No pericholecystic fluid or surrounding inflammatory changes. No intra or extrahepatic biliary ductal dilatation. Pancreas: 7 mm low-attenuation lesion in the superior aspect of the body of the pancreas (axial image 23 of series 2). No solid pancreatic mass. No pancreatic ductal dilatation. No peripancreatic fluid collections or inflammatory changes. Spleen: Unremarkable. Adrenals/Urinary Tract: Right kidney and bilateral adrenal glands are normal in appearance. Subcentimeter low-attenuation lesions in the left kidney, too small to definitively characterize, but similar to  prior study and statistically likely to represent tiny cysts (no imaging follow-up recommended). No hydroureteronephrosis. Urinary bladder is partially obscured by beam hardening  artifact from the patient's right hip arthroplasty, but visualized portions are unremarkable. Stomach/Bowel: The appearance of the intra-abdominal portion of the stomach is unremarkable. No pathologic dilatation of small bowel or colon. The appendix is not confidently identified and may be surgically absent. Regardless, there are no inflammatory changes noted adjacent to the cecum to suggest the presence of an acute appendicitis at this time. Vascular/Lymphatic: Aortic atherosclerosis with fusiform aneurysmal dilatation of the infrarenal abdominal aorta which measures up to 3.8 cm in diameter (previously 3.6 cm). No lymphadenopathy noted in the abdomen or pelvis. Reproductive: Status post hysterectomy. Ovaries are not confidently identified may be surgically absent or atrophic. Other: No significant volume of ascites.  No pneumoperitoneum. Musculoskeletal: Status post right hip arthroplasty. There are no aggressive appearing lytic or blastic lesions noted in the visualized portions of the skeleton. IMPRESSION: 1. No definite acute findings noted in the abdomen or pelvis to account for the patient's symptoms. 2. Cholelithiasis without evidence of acute cholecystitis. 3. 7 mm low-attenuation lesion in the superior aspect of the body of the pancreas, nonspecific, but statistically likely a benign pancreatic pseudocyst or side branch IPMN (intraductal papillary mucinous neoplasm). Follow-up abdominal MRI with and without IV gadolinium with MRCP or pancreatic protocol CT scan is recommended in 2 years to ensure the stability of this finding. This recommendation follows ACR consensus guidelines: Management of Incidental Pancreatic Cysts: A White Paper of the ACR Incidental Findings Committee. J Am Coll Radiol 2017;14:911-923. 4. Mild enlargement  of fusiform infrarenal abdominal aortic aneurysm which currently measures 3.8 cm in diameter (previously 3.6 cm). Recommend follow-up ultrasound every 2 years. This recommendation follows ACR consensus guidelines: White Paper of the ACR Incidental Findings Committee II on Vascular Findings. J Am Coll Radiol 2013; 10:789-794. 5. Large hiatal hernia. 6. Additional incidental findings, as above. Electronically Signed   By: Trudie Reed M.D.   On: 10/21/2022 07:00     PROCEDURES:  Critical Care performed: No      .1-3 Lead EKG Interpretation  Performed by: Analyce Tavares, Layla Maw, DO Authorized by: Eliese Kerwood, Layla Maw, DO     Interpretation: normal     ECG rate:  66   ECG rate assessment: normal     Rhythm: sinus rhythm     Ectopy: none     Conduction: normal       IMPRESSION / MDM / ASSESSMENT AND PLAN / ED COURSE  I reviewed the triage vital signs and the nursing notes.    Patient here with intermittent speech changes, left leg weakness, increased altered mental status from baseline.  The patient is on the cardiac monitor to evaluate for evidence of arrhythmia and/or significant heart rate changes.   DIFFERENTIAL DIAGNOSIS (includes but not limited to):   CVA, intracranial hemorrhage, encephalopathy, electrolyte derangement, pneumonia, viral URI, UTI, dehydration   Patient's presentation is most consistent with acute presentation with potential threat to life or bodily function.   PLAN: Workup initiated from triage.  No leukocytosis.  Normal hemoglobin.  Normal electrolytes.  Renal function minimally elevated which is her baseline.  Normal glucose.  Urine does not appear infected.  Troponin slightly elevated but flat.  CT head reviewed and interpreted by myself and radiologist and shows no acute abnormality.  Viral swab pending.  Daughter also requesting imaging of her abdomen given complaints of right lower quadrant pain that she states she has a very high pain tolerance.  Abdominal  exam here is benign and patient has had hysterectomy and appendectomy.  Will  proceed with CT of the abdomen pelvis per daughter's request although low suspicion for surgical pathology.  Unable to get MRI to rule out stroke given patient has a pacemaker.   MEDICATIONS GIVEN IN ED: Medications  aspirin EC tablet 81 mg (81 mg Oral Given 10/21/22 0955)  atorvastatin (LIPITOR) tablet 40 mg (has no administration in time range)  levothyroxine (SYNTHROID) tablet 50 mcg (0 mcg Oral Hold 10/21/22 0957)  potassium chloride SA (KLOR-CON M) CR tablet 20 mEq (20 mEq Oral Given 10/21/22 1001)  fluticasone (FLONASE) 50 MCG/ACT nasal spray 1 spray (has no administration in time range)  brimonidine (ALPHAGAN) 0.2 % ophthalmic solution 1 drop (has no administration in time range)   stroke: early stages of recovery book (has no administration in time range)  enoxaparin (LOVENOX) injection 40 mg (40 mg Subcutaneous Given 10/21/22 1001)  0.9 %  sodium chloride infusion ( Intravenous New Bag/Given 10/21/22 0959)  guaiFENesin-dextromethorphan (ROBITUSSIN DM) 100-10 MG/5ML syrup 10 mL (has no administration in time range)  chlorpheniramine-HYDROcodone (TUSSIONEX) 10-8 MG/5ML suspension 5 mL (has no administration in time range)  ascorbic acid (VITAMIN C) tablet 500 mg (500 mg Oral Given 10/21/22 1001)  famotidine (PEPCID) tablet 10 mg (10 mg Oral Given 10/21/22 1001)  traZODone (DESYREL) tablet 25 mg (has no administration in time range)  magnesium hydroxide (MILK OF MAGNESIA) suspension 30 mL (has no administration in time range)  ondansetron (ZOFRAN) tablet 4 mg (has no administration in time range)    Or  ondansetron (ZOFRAN) injection 4 mg (has no administration in time range)  nirmatrelvir/ritonavir (renal dosing) (PAXLOVID) 2 tablet (2 tablets Oral Given 10/21/22 1814)  albuterol (VENTOLIN HFA) 108 (90 Base) MCG/ACT inhaler 2 puff (has no administration in time range)  calcium-vitamin D (OSCAL WITH D) 500-5  MG-MCG per tablet 1 tablet (1 tablet Oral Given 10/21/22 1815)  mometasone-formoterol (DULERA) 200-5 MCG/ACT inhaler 2 puff (has no administration in time range)  losartan (COZAAR) tablet 100 mg (100 mg Oral Given 10/21/22 2017)  hydrALAZINE (APRESOLINE) injection 5 mg (has no administration in time range)  iohexol (OMNIPAQUE) 300 MG/ML solution 80 mL (80 mLs Intravenous Contrast Given 10/21/22 0557)  haloperidol lactate (HALDOL) injection 2 mg (2 mg Intravenous Given 10/21/22 1420)     ED COURSE: Patient positive for COVID-19.  Daughter uncomfortable with patient going back to assisted living facility because of her "dragging her left leg" and that she is increased risk for falls however nursing staff reports they have seen her stand here without difficulty and she is moving both right and left legs without any difficulty and has good strength.  Low suspicion for stroke.  I suspect these intermittent changes in her mental status and neurologic status are likely secondary to her COVID infection.  No hypoxia.  Lungs are clear to auscultation.  Will add on chest x-ray.   Chest x-ray and CT abdomen pelvis reviewed and interpreted by myself and the radiologist.  Chest x-ray shows signs of COVID-pneumonia.  No hypoxia or increased work of breathing here.  CT of abdomen pelvis shows no acute abnormality.  Will discuss with hospitalist for admission.  CONSULTS:  Consulted and discussed patient's case with hospitalist, Dr. Arville Care.  I have recommended admission and consulting physician agrees and will place admission orders.  Patient (and family if present) agree with this plan.   I reviewed all nursing notes, vitals, pertinent previous records.  All labs, EKGs, imaging ordered have been independently reviewed and interpreted by myself.  OUTSIDE RECORDS REVIEWED:  Reviewed patient's last family medicine note with Sammuel Cooper, NP yesterday.       FINAL CLINICAL IMPRESSION(S) / ED DIAGNOSES    Final diagnoses:  COVID-19  Altered mental status, unspecified altered mental status type  Weakness     Rx / DC Orders   ED Discharge Orders     None        Note:  This document was prepared using Dragon voice recognition software and may include unintentional dictation errors.   Aowyn Rozeboom, Layla Maw, DO 10/21/22 2337

## 2022-10-21 NOTE — Progress Notes (Signed)
SLP Cancellation Note  Patient Details Name: AROUSH CHASSE MRN: [id number] DOB: [birth year]   Cancelled treatment:       Reason Eval/Treat Not Completed: SLP screened, no needs identified, will sign off (Per chart review, pt's speech deficits have since resolved. Stroke work up negative.)  Clyde Canterbury, M.S., CCC-SLP Speech-Language Pathologist [facility] [phone] Arnette Felts)  Woodroe Chen 10/21/2022, 11:54 AM

## 2022-10-21 NOTE — Evaluation (Signed)
Occupational Therapy Evaluation Patient Details Name: [name] MRN: [id number] DOB: [birth year] Today's Date: 10/21/2022   History of Present Illness 88 y/o female presented to ED on 10/20/22 for AMS and weakness. CT head negative. Tested positive for COVID. CXR showed L lung PNA. PMH: AAA, CAD, HTN, asthma, dementia, CKD, pacemaker, CVA   Clinical Impression   Chart reviewed, nurse cleared pt for participation in OT evaluation. PT is attempting to get out of bed over stretcher rails to use the bathroom. Co tx completed with PT on this date. Pt is impulsive throughout with daughter reporting pt having baseline cognitive deficits but pt is not at baseline cognitively at this time. [facility] live [facility], physically able to perform ADLs however requires cueing and supervision for completion per daughter report. Pt has assist for IADLs. Pt presents with deficits in strength, endurance, activity tolerance, cognition affecting safe and optimal ADL completion. No gross BUE deficits noted. Pt donns socks with supervision, completes toileting with supervision, amb in room with rollator with CGA-supervision. Pt daughter educated on recommendations for pt to return to familiar living environment with supervision for all ADL/IADL, home health OT. Daughter expresses desire to learn more regarding tx plan from care team, MD notified. Pt is left as received, all needs met. RN aware of pt status. OT will follow acutely.    Recommendations for follow up therapy are one component of a multi-disciplinary discharge planning process, led by the attending physician.  Recommendations may be updated based on patient status, additional functional criteria and insurance authorization.   Follow Up Recommendations  Home health OT     Assistance Recommended at Discharge Frequent or constant Supervision/Assistance  Patient can return home with the following A little help with walking and/or transfers;A little  help with bathing/dressing/bathroom;Assistance with cooking/housework;Direct supervision/assist for medications management    Functional Status Assessment  Patient has had a recent decline in their functional status and demonstrates the ability to make significant improvements in function in a reasonable and predictable amount of time.  Equipment Recommendations  None recommended by OT    Recommendations for Other Services       Precautions / Restrictions Precautions Precautions: Fall Restrictions Weight Bearing Restrictions: No      Mobility Bed Mobility Overal bed mobility: Needs Assistance Bed Mobility: Sit to Supine, Supine to Sit     Supine to sit: Supervision Sit to supine: Supervision   General bed mobility comments: from stretcher    Transfers Overall transfer level: Needs assistance Equipment used: Rollator (4 wheels), 1 person hand held assist Transfers: Sit to/from Stand, Bed to chair/wheelchair/BSC Sit to Stand: Min guard     Step pivot transfers: Min assist     General transfer comment: MIN A for step pivot onto bsc with HHA, supervision-CGA for amb transfer with rollator      Balance Overall balance assessment: Needs assistance Sitting-balance support: No upper extremity supported, Feet supported Sitting balance-Leahy Scale: Good     Standing balance support: Bilateral upper extremity supported, Reliant on assistive device for balance Standing balance-Leahy Scale: Fair                             ADL either performed or assessed with clinical judgement   ADL Overall ADL's : Needs assistance/impaired     Grooming: Wash/dry hands;Sitting;Supervision/safety               Lower Body Dressing: Supervision/safety;Sitting/lateral leans Lower  Body Dressing Details (indicate cue type and reason): socks Toilet Transfer: Minimal assistance Toilet Transfer Details (indicate cue type and reason): MIN A for HHA for step pivot from bed,  supervision-CGA for amb in room with rollator Toileting- Clothing Manipulation and Hygiene: Supervision/safety;Sitting/lateral lean Toileting - Clothing Manipulation Details (indicate cue type and reason): after urinating on toilet     Functional mobility during ADLs: Min guard;Rollator (4 wheels);Cueing for safety       Vision Patient Visual Report: No change from baseline       Perception     Praxis      Pertinent Vitals/Pain Pain Assessment Pain Assessment: No/denies pain     Hand Dominance     Extremity/Trunk Assessment Upper Extremity Assessment Upper Extremity Assessment: Overall WFL for tasks assessed   Lower Extremity Assessment Lower Extremity Assessment: Generalized weakness   Cervical / Trunk Assessment Cervical / Trunk Assessment: Kyphotic   Communication Communication Communication: No difficulties   Cognition Arousal/Alertness: Awake/alert Behavior During Therapy: Impulsive Overall Cognitive Status: Impaired/Different from baseline Area of Impairment: Orientation, Attention, Memory, Following commands, Safety/judgement, Awareness, Problem solving                 Orientation Level: Disoriented to, Place, Time, Situation Current Attention Level: Focused Memory: Decreased short-term memory Following Commands: Follows one step commands inconsistently Safety/Judgement: Decreased awareness of safety, Decreased awareness of deficits Awareness: Intellectual Problem Solving: Difficulty sequencing, Requires verbal cues, Requires tactile cues General Comments: cognitive deficits at baseline, daughter reports worsening over the last few months, acutely a significant difference     General Comments  vitals monitored, appear stable throughout    Exercises Other Exercises Other Exercises: edu pt and daughter re: role of rehab, discharge recommendations, safe ADL completion   Shoulder Instructions      Home Living Family/patient expects to be  discharged to:: Assisted living                             Home Equipment: Rollator (4 wheels)   Additional Comments: pt lives at [facility]      Prior Functioning/Environment Prior Level of Function : Needs assist  Cognitive Assist : ADLs (cognitive)           Mobility Comments: amb with rollator with supervision ADLs Comments: per daugther pt requires supervision and cueing to complete tasks, has noticed a cognitive decline over the last few months;        OT Problem List: Decreased strength;Decreased activity tolerance;Decreased knowledge of use of DME or AE;Impaired balance (sitting and/or standing);Decreased safety awareness;Decreased cognition      OT Treatment/Interventions: Self-care/ADL training;Patient/family education;Therapeutic exercise;Balance training;Therapeutic activities;DME and/or AE instruction    OT Goals(Current goals can be found in the care plan section) Acute Rehab OT Goals Patient Stated Goal: improve functional status OT Goal Formulation: With patient/family Time For Goal Achievement: 11/04/22 Potential to Achieve Goals: Good ADL Goals Pt Will Perform Grooming: with supervision;standing;sitting Pt Will Perform Lower Body Dressing: with supervision;sit to/from stand Pt Will Transfer to Toilet: with supervision;ambulating Pt Will Perform Toileting - Clothing Manipulation and hygiene: with supervision;sit to/from stand  OT Frequency: Min 2X/week    Co-evaluation PT/OT/SLP Co-Evaluation/Treatment: Yes Reason for Co-Treatment: For patient/therapist safety;To address functional/ADL transfers   OT goals addressed during session: ADL's and self-care      AM-PAC OT "6 Clicks" Daily Activity     Outcome Measure Help from another person eating meals?: None Help from another  person taking care of personal grooming?: None Help from another person toileting, which includes using toliet, bedpan, or urinal?: A Little Help from another  person bathing (including washing, rinsing, drying)?: A Little Help from another person to put on and taking off regular upper body clothing?: None Help from another person to put on and taking off regular lower body clothing?: None 6 Click Score: 22   End of Session Equipment Utilized During Treatment: Rollator (4 wheels) Nurse Communication: Mobility status  Activity Tolerance: Patient tolerated treatment well Patient left: in bed;with call bell/phone within reach;with family/visitor present  OT Visit Diagnosis: Unsteadiness on feet (R26.81);Muscle weakness (generalized) (M62.81)                Time: 4388-8757 OT Time Calculation (min): 32 min Charges:  OT General Charges $OT Visit: 1 Visit OT Evaluation $OT Eval Moderate Complexity: 1 Mod Shanon Payor, OTD OTR/L  10/21/22, 3:44 PM

## 2022-10-22 DIAGNOSIS — G9341 Metabolic encephalopathy: Secondary | ICD-10-CM | POA: Diagnosis not present

## 2022-10-22 LAB — CBC WITH DIFFERENTIAL/PLATELET
Abs Immature Granulocytes: 0.01 10*3/uL (ref 0.00–0.07)
Basophils Absolute: 0 10*3/uL (ref 0.0–0.1)
Basophils Relative: 1 %
Eosinophils Absolute: 0 10*3/uL (ref 0.0–0.5)
Eosinophils Relative: 0 %
HCT: 38.9 % (ref 36.0–46.0)
Hemoglobin: 13.1 g/dL (ref 12.0–15.0)
Immature Granulocytes: 0 %
Lymphocytes Relative: 28 %
Lymphs Abs: 1 10*3/uL (ref 0.7–4.0)
MCH: 31.1 pg (ref 26.0–34.0)
MCHC: 33.7 g/dL (ref 30.0–36.0)
MCV: 92.4 fL (ref 80.0–100.0)
Monocytes Absolute: 0.5 10*3/uL (ref 0.1–1.0)
Monocytes Relative: 13 %
Neutro Abs: 2.2 10*3/uL (ref 1.7–7.7)
Neutrophils Relative %: 58 %
Platelets: 158 10*3/uL (ref 150–400)
RBC: 4.21 MIL/uL (ref 3.87–5.11)
RDW: 13.3 % (ref 11.5–15.5)
WBC: 3.7 10*3/uL — ABNORMAL LOW (ref 4.0–10.5)
nRBC: 0 % (ref 0.0–0.2)

## 2022-10-22 LAB — COMPREHENSIVE METABOLIC PANEL
ALT: 13 U/L (ref 0–44)
AST: 24 U/L (ref 15–41)
Albumin: 3.6 g/dL (ref 3.5–5.0)
Alkaline Phosphatase: 71 U/L (ref 38–126)
Anion gap: 8 (ref 5–15)
BUN: 18 mg/dL (ref 8–23)
CO2: 26 mmol/L (ref 22–32)
Calcium: 8.6 mg/dL — ABNORMAL LOW (ref 8.9–10.3)
Chloride: 105 mmol/L (ref 98–111)
Creatinine, Ser: 1.19 mg/dL — ABNORMAL HIGH (ref 0.44–1.00)
GFR, Estimated: 45 mL/min — ABNORMAL LOW (ref >60)
Glucose, Bld: 90 mg/dL (ref 70–99)
Potassium: 3.5 mmol/L (ref 3.5–5.1)
Sodium: 139 mmol/L (ref 135–145)
Total Bilirubin: 1 mg/dL (ref 0.3–1.2)
Total Protein: 6.3 g/dL — ABNORMAL LOW (ref 6.5–8.1)

## 2022-10-22 LAB — LIPID PANEL
Cholesterol: 137 mg/dL (ref 0–200)
HDL: 40 mg/dL — ABNORMAL LOW (ref >40)
LDL Cholesterol: 76 mg/dL (ref 0–99)
Total CHOL/HDL Ratio: 3.4 RATIO
Triglycerides: 103 mg/dL (ref <150)
VLDL: 21 mg/dL (ref 0–40)

## 2022-10-22 MED ORDER — ASCORBIC ACID 500 MG PO TABS: 500.0000 mg | ORAL_TABLET | Freq: Every day | ORAL | 0 refills | Status: DC

## 2022-10-22 MED ORDER — GUAIFENESIN-DM 100-10 MG/5ML PO SYRP: 10.0000 mL | ORAL_SOLUTION | ORAL | 0 refills | Status: DC | PRN

## 2022-10-22 MED ORDER — NIRMATRELVIR/RITONAVIR (PAXLOVID) TABLET (RENAL DOSING): 2.0000 | ORAL_TABLET | Freq: Two times a day (BID) | ORAL | 0 refills | Status: DC

## 2022-10-22 MED ORDER — NIRMATRELVIR/RITONAVIR (PAXLOVID) TABLET (RENAL DOSING): 2.0000 | ORAL_TABLET | Freq: Two times a day (BID) | ORAL | 0 refills | Status: AC

## 2022-10-22 MED ORDER — ASCORBIC ACID 500 MG PO TABS: 500.0000 mg | ORAL_TABLET | Freq: Every day | ORAL | 0 refills | Status: AC

## 2022-10-22 NOTE — Discharge Summary (Signed)
Physician Discharge Summary  [name] [facility]:[id number] DOB: 1934-08-14 DOA: 10/21/2022  PCP: Doreene Nest, NP  Admit date: 10/21/2022 Discharge date: 10/22/2022  Admitted From: ALF Disposition:  ALF  Recommendations for Outpatient Follow-up:  Follow up with PCP in 1-2 weeks  Home Health: none Equipment/Devices: none  Discharge Condition: stable CODE STATUS: DNR Diet Orders (From admission, onward)     Start     Ordered   10/21/22 1005  Diet Heart Room service appropriate? Yes; Fluid consistency: Thin  Diet effective now       Question Answer Comment  Room service appropriate? Yes   Fluid consistency: Thin   Na restriction, if any: 2 gm Na      10/21/22 1004            HPI: Per admitting MD, [name] is a 88 y.o. female with medical history significant of pacemaker placement, HTN, HLD, asthma, CAD, CABG, stroke, hypothyroidism, CKD-3A, left bundle blockade, s/p of AVR, dementia, who presents with altered mental status and cough.  Per her daughter, patient has been confused in the past 4 days.  At her normal baseline, patient recognizes family members, but usually not orientated to place and time.  Currently patient does not recognize her daughter as clear as before.  Patient has difficulty speaking and with stuttering.  She seems to have left leg weakness since patient has been dragging her left leg.  Patient has dry cough in the past 4 days, no chest pain, fever or chills.  Patient does not have nausea vomiting, diarrhea abdominal pain per her daughter.  Hospital Course / Discharge diagnoses: Principal Problem:   Acute metabolic encephalopathy Active Problems:   Stroke (HCC)   COVID-19 virus infection   CAD (coronary artery disease) s/p CABG - 2009 (LIMA to LAD, SVG to LCX, SVG to L-PLV   Myocardial injury   HTN (hypertension)   Hypothyroidism   Chronic kidney disease, stage 3a (HCC)   HLD (hyperlipidemia)   Asthma   Pancreatic  lesion  Principal problem Acute metabolic encephalopathy due to COVID-19-patient was initially admitted due to left lower extremity weakness, with concerns for CVA.  Neurology consulted and evaluated patient, it was felt less likely that this is a CVA.  She underwent a CT scan which was negative for acute findings.  An MRI cannot be obtained due to presence of pacemaker.  She has returned to baseline, continue home aspirin and will be discharged in stable condition  Active problems Hx of Stroke [facility]) -continue home ASA and Lipitor COVID-19-with an infiltrate on chest x-ray but she is on room air.  Has been placed on Paxlovid, continue treatment Myocardial injury and history of CAD (coronary artery disease) s/p CABG - 2009 (LIMA to LAD, SVG to LCX, SVG to L-PLV - Troponin level 18 --> 18. HTN (hypertension) -resume home medications   Hypothyroidism -Synthroid Chronic kidney disease, stage 3a (HCC) - Stable, at baseline HLD (hyperlipidemia) -Lipitor Asthma -As needed albuterol Pancreatic lesion - This is an incidental finding on CT scan, need to follow-up with PCP for outpatient follow-up  Sepsis ruled out   Discharge Instructions   Allergies as of 10/22/2022       Reactions   Codeine Nausea And Vomiting, Other (See Comments)   Patient gets violently ill   Morphine And Related Nausea And Vomiting, Other (See Comments)   Patient get violently ill        Medication List     TAKE these medications  alendronate 70 MG tablet Commonly known as: FOSAMAX Take 1 tablet (70 mg total) by mouth once a week. No food or other medications for 30 min. Avoid laying flat for two hours.   amoxicillin 500 MG tablet Commonly known as: AMOXIL Take 2,000 mg by mouth See admin instructions. Take 2,000mg  by mouth 1 hour prior to dental appointments   ascorbic acid 500 MG tablet Commonly known as: VITAMIN C Take 1 tablet (500 mg total) by mouth daily for 5 days.   aspirin EC 81 MG  tablet Commonly known as: Aspirin Low Dose Take 1 tablet (81 mg total) by mouth daily.   atorvastatin 40 MG tablet Commonly known as: LIPITOR Take 1 tablet (40 mg total) by mouth daily. For cholesterol.   brimonidine 0.2 % ophthalmic solution Commonly known as: ALPHAGAN Place 1 drop into the left eye 2 (two) times daily.   Caltrate 600+D Plus Minerals 600-800 MG-UNIT Tabs Take 1 tablet by mouth twice daily for calcium and vitamin D.   fluticasone 50 MCG/ACT nasal spray Commonly known as: FLONASE PLACE 1 SPRAY INTO BOTH NOSTRILS DAILY   fluticasone-salmeterol 250-50 MCG/ACT Aepb Commonly known as: ADVAIR INHALE 1 PUFF INTO THE LUNGS 2 TIMES DAILY FOR COPD. RINSE MOUTH AFTER USE   furosemide 40 MG tablet Commonly known as: LASIX TAKE 1 TABLET BY MOUTH DAILY   guaiFENesin-dextromethorphan 100-10 MG/5ML syrup Commonly known as: ROBITUSSIN DM Take 10 mLs by mouth every 4 (four) hours as needed for cough.   levothyroxine 50 MCG tablet Commonly known as: SYNTHROID TAKE 1 TABLET BY MOUTH EVERY MORNING ON AN EMPTY STOMACH WITH WATER ONLY. NO FOOD OR OTHER MEDICATIONS FOR 30 MINS. What changed: See the new instructions.   losartan 100 MG tablet Commonly known as: COZAAR TAKE 1 TABLET BY MOUTH DAILY FOR BLOOD PRESSURE   nirmatrelvir/ritonavir (renal dosing) 10 x 150 MG & 10 x 100MG  Tabs Commonly known as: PAXLOVID Take 2 tablets by mouth 2 (two) times daily for 5 days. Patient GFR is 45. Take nirmatrelvir (150 mg) one tablet twice daily for 5 days and ritonavir (100 mg) one tablet twice daily for 5 days.   potassium chloride 10 MEQ tablet Commonly known as: KLOR-CON Take 2 tablets (20 mEq total) by mouth 2 (two) times daily.       Consultations: Neurology   Procedures/Studies:  DG HIP UNILAT WITH PELVIS 2-3 VIEWS LEFT  Result Date: 10/21/2022 CLINICAL DATA:  Left hip pain. EXAM: DG HIP (WITH OR WITHOUT PELVIS) 2-3V LEFT COMPARISON:  03/02/2022 FINDINGS: Total right  hip arthroplasty components appear well seated without complicating features. Pubic symphysis and SI joints are intact. No pelvic fractures or bone lesions are identified. Left hip is normally located. No significant degenerative changes. No left hip fracture or AVN. IMPRESSION: Intact left hip. No fracture or AVN. No significant degenerative changes. Electronically Signed   By: 05/02/2022 M.D.   On: 10/21/2022 10:49   DG Chest Portable 1 View  Result Date: 10/21/2022 CLINICAL DATA:  88 year old female with history of altered mental status. COVID infection. EXAM: PORTABLE CHEST 1 VIEW COMPARISON:  Chest x-ray 11/14/2021. FINDINGS: Patchy areas of airspace consolidation are noted in the left base and medial aspect of the left upper lobe. No definite pleural effusions. Right lung is clear. No pneumothorax. No evidence of pulmonary edema. Heart size is borderline enlarged. Upper mediastinal contours are within normal limits. Left-sided pacemaker device in place with lead tips projecting over the expected location of the right atrium  and right ventricle. Status post median sternotomy for CABG and aortic valve replacement with what appears to be a stented bioprosthesis. IMPRESSION: 1. Patchy airspace consolidation in the left lung, concerning for multilobar bronchopneumonia. Followup PA and lateral chest X-ray is recommended in 3-4 weeks following trial of antibiotic therapy to ensure resolution and exclude underlying malignancy. Electronically Signed   By: Daniel  Entrikin M.D.   On: 10/21/2022 07:15   CT ABDOMEN PELVIS W CONTRAST  Result Date: 10/21/2022 CLINICAL DATA:  88 year old female with history of altered mental status, weakness and abdominal pain. EXAM: CT ABDOMEN AND PELVIS WITH CONTRAST TECHNIQUE: Multidetector CT imaging of the abdomen and pelvis was performed using the standard protocol following bolus administration of intravenous contrast. RADIATION DOSE REDUCTION: This exam was performed  according to the departmental dose-optimization program which includes automated exposure control, adjustment of the mA and/or kV according to patient size and/or use of iterative reconstructionLiKentuckynWashingtoHahnemann ULiKentuckynProgressive SurgBroadwest Specialty Surgica433KentLiKentuckynEastern New MexThrockmorton CountyLiKentuckynMission Community HospitalUchealth Highlands Ra433KentuLiKentuckynVeteransSt. Vi433KentLiKentuckynMetairie LaCommunity Surgery CentLiKentuckynNorthwest Florida Surgical Center Inc Dba North FlorSt Fran433KentuckyTerKLiKentuckynM S Clin433KentucLiKentuckynTurquoMedstar Union Memor433KeLiKentuckynCook Children'S San Antonio ReLiTrini433KentuckyTLiKentuckynSaraLiKentuckynValley CoThe Surgery Center At JeLiKentuckynNSatanta DLiKentuckynEastside EndOchsner Medical Center433KenLiKentuckynFiLiKentuckynOchsner Medical CenCape Coral E433KentuckyTerKeLiKentuckynHemet HealthcaMillinocket Regio433KLiKentuckynT J Samson Centrastate Me43LiKentuckynSurgical Specialty Center At WesterLiKentuckynHuron ValLawrence County Memor433KenLiKentuckynCrichton RehCox Medical Centers So433KeLiKentuckynParkviewValley ForgeLiKentuckynOjai Valley Excela Health LaLiKentuckynSelect Specialty Hospital Central PennWatauga MedicaLiKentuckynUnited Regional Cobre Valley ReLiKentuckynTennova HGreenbelt Urology I433KentuckyTerKentuc82fyJTown and CountryndleartonyTerKentuc22fy8Krist16101[place] [place] S621Jonny [id number] S: Lower chest: Large hiatal hernia. Status post median sternotomy for aortic valve replacement with a mechanical aortic valve. Pacemaker leads in the right atrium and right ventricle. Mild scarring in the lung bases bilaterally, most evident in the left lower lobe. 3 mm right middle lobe pulmonary nodule (axial image 16 of series 4), unchanged, likely benign. Hepatobiliary: Subcentimeter low-attenuation lesion in segment 2 of the liver (axial image 16 of series 2), stable compared to the prior study, too small to definitively characterize, but statistically likely to represent a tiny cyst. No other suspicious appearing hepatic lesions. 6 mm calcified gallstone lying dependently in the gallbladder. Gallbladder is not distended. No pericholecystic fluid or surrounding inflammatory changes. No intra or extrahepatic biliary ductal dilatation. Pancreas: 7 mm low-attenuation lesion in the superior aspect of the body of the pancreas (axial image 23 of series 2). No solid pancreatic mass. No pancreatic ductal dilatation. No peripancreatic fluid collections or inflammatory changes. Spleen: Unremarkable. Adrenals/Urinary Tract: Right kidney and bilateral adrenal glands are normal in appearance. Subcentimeter low-attenuation lesions in the left kidney, too small to definitively characterize, but similar to prior study and statistically likely to represent tiny cysts (no imaging follow-up recommended). No hydroureteronephrosis. Urinary bladder is partially obscured by beam hardening artifact from the patient's right hip arthroplasty, but visualized portions are unremarkable. Stomach/Bowel: The appearance of the  intra-abdominal portion of the stomach is unremarkable. No pathologic dilatation of small bowel or colon. The appendix is not confidently identified and may be surgically absent. Regardless, there are no inflammatory changes noted adjacent to the cecum to suggest the presence of an acute appendicitis at this time. Vascular/Lymphatic: Aortic atherosclerosis with fusiform aneurysmal dilatation of the infrarenal abdominal aorta which measures up to 3.8 cm in diameter (previously 3.6 cm). No lymphadenopathy noted in the abdomen or pelvis. Reproductive: Status post hysterectomy. Ovaries are not confidently identified may be surgically absent or atrophic. Other: No significant volume of ascites.  No pneumoperitoneum. Musculoskeletal: Status post right hip arthroplasty. There are no aggressive appearing lytic or blastic lesions noted in the visualized portions of the skeleton. IMPRESSION: 1. No definite acute findings noted in the abdomen or pelvis to account for the patient's symptoms. 2. Cholelithiasis without evidence of acute cholecystitis. 3. 7 mm low-attenuation lesion in the superior aspect of the body of the pancreas, nonspecific, but statistically likely a benign pancreatic pseudocyst or side branch IPMN (intraductal papillary mucinous neoplasm). Follow-up abdominal MRI with and without IV gadolinium  with MRCP or pancreatic protocol CT scan is recommended in 2 years to ensure the stability of this finding. This recommendation follows ACR consensus guidelines: Management of Incidental Pancreatic Cysts: A White Paper of the ACR Incidental Findings Committee. J Am Coll Radiol 2017;14:911-923. 4. Mild enlargement of fusiform infrarenal abdominal aortic aneurysm which currently measures 3.8 cm in diameter (previously 3.6 cm). Recommend follow-up ultrasound every 2 years. This recommendation follows ACR consensus guidelines: White Paper of the ACR Incidental Findings Committee II on Vascular Findings. J Am Coll Radiol  2013; 10:789-794. 5. Large hiatal hernia. 6. Additional incidental findings, as above. Electronically Signed   By: Trudie Reed M.D.   On: 10/21/2022 07:00   CT HEAD WO CONTRAST ( )  Result Date: 10/20/2022 CLINICAL DATA:  TIA, altered mental status.  Slurred speech. EXAM: CT HEAD WITHOUT CONTRAST TECHNIQUE: Contiguous axial images were obtained from the base of the skull through the vertex without intravenous contrast. RADIATION DOSE REDUCTION: This exam was performed according to the departmental dose-optimization program which includes automated exposure control, adjustment of the mA and/or kV according to patient size and/or use of iterative reconstruction technique. COMPARISON:  11/14/2021 FINDINGS: Brain: There is atrophy and chronic small vessel disease changes. No acute intracranial abnormality. Specifically, no hemorrhage, hydrocephalus, mass lesion, acute infarction, or significant intracranial injury. Vascular: No hyperdense vessel or unexpected calcification. Skull: No acute calvarial abnormality. Sinuses/Orbits: No acute findings Other: None IMPRESSION: Atrophy, chronic microvascular disease. No acute intracranial abnormality. Electronically Signed   By: Charlett Nose M.D.   On: 10/20/2022 18:14     Subjective: - no chest pain, shortness of breath, no abdominal pain, nausea or vomiting.   Discharge Exam: BP (!) 148/70 (BP Location: Right Arm)   Pulse 60   Temp 98 F (36.7 C) (Oral)   Resp 16   Ht 5\' 9"  (1.753 m)   Wt 71 kg   SpO2 99%   BMI 23.11 kg/m   General: Pt is alert, awake, not in acute distress Cardiovascular: RRR, S1/S2 +, no rubs, no gallops Respiratory: CTA bilaterally, no wheezing, no rhonchi Abdominal: Soft, NT, ND, bowel sounds + Extremities: no edema, no cyanosis   The results of significant diagnostics from this hospitalization (including imaging, microbiology, ancillary and laboratory) are listed below for reference.     Microbiology: Recent  Results (from the past 240 hour(s))  Resp panel by RT-PCR (RSV, Flu A&B, Covid) Anterior Nasal Swab     Status: Abnormal   Collection Time: 10/21/22  3:46 AM   Specimen: Anterior Nasal Swab  Result Value Ref Range Status   SARS Coronavirus 2 by RT PCR POSITIVE (A) NEGATIVE Final    Comment: (NOTE) SARS-CoV-2 target nucleic acids are DETECTED.  The SARS-CoV-2 RNA is generally detectable in upper respiratory specimens during the acute phase of infection. Positive results are indicative of the presence of the identified virus, but do not rule out bacterial infection or co-infection with other pathogens not detected by the test. Clinical correlation with patient history and other diagnostic information is necessary to determine patient infection status. The expected result is Negative.  Fact Sheet for Patients: 10/23/22  Fact Sheet for Healthcare Providers: BloggerCourse.com  This test is not yet approved or cleared by the [place] FDA and  has been authorized for detection and/or diagnosis of SARS-CoV-2 by FDA under an Emergency Use Authorization (EUA).  This EUA will remain in effect (meaning this test can be used) for the duration of  the COVID-19 declaration under  Section 564(b)(1) of the A ct, 21 U.S.C. section 360bbb-3(b)(1), unless the authorization is terminated or revoked sooner.     Influenza A by PCR NEGATIVE NEGATIVE Final   Influenza B by PCR NEGATIVE NEGATIVE Final    Comment: (NOTE) The Xpert Xpress SARS-CoV-2/FLU/RSV plus assay is intended as an aid in the diagnosis of influenza from Nasopharyngeal swab specimens and should not be used as a sole basis for treatment. Nasal washings and aspirates are unacceptable for Xpert Xpress SARS-CoV-2/FLU/RSV testing.  Fact Sheet for Patients: BloggerCourse.com  Fact Sheet for Healthcare  Providers: SeriousBroker.it  This test is not yet approved or cleared by the [place] FDA and has been authorized for detection and/or diagnosis of SARS-CoV-2 by FDA under an Emergency Use Authorization (EUA). This EUA will remain in effect (meaning this test can be used) for the duration of the COVID-19 declaration under Section 564(b)(1) of the Act, 21 U.S.C. section 360bbb-3(b)(1), unless the authorization is terminated or revoked.     Resp Syncytial Virus by PCR NEGATIVE NEGATIVE Final    Comment: (NOTE) Fact Sheet for Patients: BloggerCourse.com  Fact Sheet for Healthcare Providers: SeriousBroker.it  This test is not yet approved or cleared by the [place] FDA and has been authorized for detection and/or diagnosis of SARS-CoV-2 by FDA under an Emergency Use Authorization (EUA). This EUA will remain in effect (meaning this test can be used) for the duration of the COVID-19 declaration under Section 564(b)(1) of the Act, 21 U.S.C. section 360bbb-3(b)(1), unless the authorization is terminated or revoked.  Performed at [facility], [address] [place], [place] [address]      Labs: Basic Metabolic Panel: Recent Labs  Lab 10/20/22 1747 10/22/22 0600  NA 138 139  K 3.6 3.5  CL 102 105  CO2 23 26  GLUCOSE 111* 90  BUN 10 18  CREATININE 1.13* 1.19*  CALCIUM 9.2 8.6*   Liver Function Tests: Recent Labs  Lab 10/20/22 1747 10/22/22 0600  AST 24 24  ALT 9 13  ALKPHOS 88 71  BILITOT 1.6* 1.0  PROT 7.5 6.3*  ALBUMIN 4.2 3.6   CBC: Recent Labs  Lab 10/20/22 1747 10/22/22 0600  WBC 4.5 3.7*  NEUTROABS  --  2.2  HGB 14.6 13.1  HCT 44.2 38.9  MCV 93.1 92.4  PLT 156 158   CBG: Recent Labs  Lab 10/20/22 1756  GLUCAP 93   Hgb A1c No results for input(s): "HGBA1C" in the last 72 hours. Lipid Profile Recent Labs    10/22/22 0600  CHOL 137  HDL 40*   LDLCALC 76  TRIG 103  CHOLHDL 3.4   Thyroid function studies No results for input(s): "TSH", "T4TOTAL", "T3FREE", "THYROIDAB" in the last 72 hours.  Invalid input(s): "FREET3" Urinalysis    Component Value Date/Time   COLORURINE YELLOW (A) 10/20/2022 2037   APPEARANCEUR CLEAR (A) 10/20/2022 2037   LABSPEC 1.026 10/20/2022 2037   PHURINE 6.0 10/20/2022 2037   GLUCOSEU NEGATIVE 10/20/2022 2037   HGBUR NEGATIVE 10/20/2022 2037   BILIRUBINUR NEGATIVE 10/20/2022 2037   BILIRUBINUR neg 08/13/2022 1509   KETONESUR 5 (A) 10/20/2022 2037   PROTEINUR 30 (A) 10/20/2022 2037   UROBILINOGEN 0.2 08/13/2022 1509   UROBILINOGEN 0.2 08/17/2013 1552   NITRITE NEGATIVE 10/20/2022 2037   LEUKOCYTESUR NEGATIVE 10/20/2022 2037    FURTHER DISCHARGE INSTRUCTIONS:   Get Medicines reviewed and adjusted: Please take all your medications with you for your next visit with your Primary MD   Laboratory/radiological data:  Please request your Primary MD to go over all hospital tests and procedure/radiological results at the follow up, please ask your Primary MD to get all Hospital records sent to his/her office.   In some cases, they will be blood work, cultures and biopsy results pending at the time of your discharge. Please request that your primary care M.D. goes through all the records of your hospital data and follows up on these results.   Also Note the following: If you experience worsening of your admission symptoms, develop shortness of breath, life threatening emergency, suicidal or homicidal thoughts you must seek medical attention immediately by calling 911 or calling your MD immediately  if symptoms less severe.   You must read complete instructions/literature along with all the possible adverse reactions/side effects for all the Medicines you take and that have been prescribed to you. Take any new Medicines after you have completely understood and accpet all the possible adverse reactions/side  effects.    Do not drive when taking Pain medications or sleeping medications (Benzodaizepines)   Do not take more than prescribed Pain, Sleep and Anxiety Medications. It is not advisable to combine anxiety,sleep and pain medications without talking with your primary care practitioner   Special Instructions: If you have smoked or chewed Tobacco  in the last 2 yrs please stop smoking, stop any regular Alcohol  and or any Recreational drug use.   Wear Seat belts while driving.   Please note: You were cared for by a hospitalist during your hospital stay. Once you are discharged, your primary care physician will handle any further medical issues. Please note that NO REFILLS for any discharge medications will be authorized once you are discharged, as it is imperative that you return to your primary care physician (or establish a relationship with a primary care physician if you do not have one) for your post hospital discharge needs so that they can reassess your need for medications and monitor your lab values.  Time coordinating discharge: 40 minutes  SIGNED:  Pamella Pert, MD, PhD 10/22/2022, 7:25 AM

## 2022-10-22 NOTE — Discharge Instructions (Signed)
Follow with Doreene Nest, NP in 5-7 days  Please get a complete blood count and chemistry panel checked by your Primary MD at your next visit, and again as instructed by your Primary MD. Please get your medications reviewed and adjusted by your Primary MD.  Please request your Primary MD to go over all Hospital Tests and Procedure/Radiological results at the follow up, please get all Hospital records sent to your Prim MD by signing hospital release before you go home.  In some cases, there will be blood work, cultures and biopsy results pending at the time of your discharge. Please request that your primary care M.D. goes through all the records of your hospital data and follows up on these results.  If you had Pneumonia of Lung problems at the Hospital: Please get a 2 view Chest X ray done in 6-8 weeks after hospital discharge or sooner if instructed by your Primary MD.  If you have Congestive Heart Failure: Please call your Cardiologist or Primary MD anytime you have any of the following symptoms:  1) 3 pound weight gain in 24 hours or 5 pounds in 1 week  2) shortness of breath, with or without a dry hacking cough  3) swelling in the hands, feet or stomach  4) if you have to sleep on extra pillows at night in order to breathe  Follow cardiac low salt diet and 1.5 lit/day fluid restriction.  If you have diabetes Accuchecks 4 times/day, Once in AM empty stomach and then before each meal. Log in all results and show them to your primary doctor at your next visit. If any glucose reading is under 80 or above 300 call your primary MD immediately.  If you have Seizure/Convulsions/Epilepsy: Please do not drive, operate heavy machinery, participate in activities at heights or participate in high speed sports until you have seen by Primary MD or a Neurologist and advised to do so again. Per [facility] statutes, patients with seizures are not allowed to drive until they have been  seizure-free for six months.  Use caution when using heavy equipment or power tools. Avoid working on ladders or at heights. Take showers instead of baths. Ensure the water temperature is not too high on the home water heater. Do not go swimming alone. Do not lock yourself in a room alone (i.e. bathroom). When caring for infants or small children, sit down when holding, feeding, or changing them to minimize risk of injury to the child in the event you have a seizure. Maintain good sleep hygiene. Avoid alcohol.   If you had Gastrointestinal Bleeding: Please ask your Primary MD to check a complete blood count within one week of discharge or at your next visit. Your endoscopic/colonoscopic biopsies that are pending at the time of discharge, will also need to followed by your Primary MD.  Get Medicines reviewed and adjusted. Please take all your medications with you for your next visit with your Primary MD  Please request your Primary MD to go over all hospital tests and procedure/radiological results at the follow up, please ask your Primary MD to get all Hospital records sent to his/her office.  If you experience worsening of your admission symptoms, develop shortness of breath, life threatening emergency, suicidal or homicidal thoughts you must seek medical attention immediately by calling 911 or calling your MD immediately  if symptoms less severe.  You must read complete instructions/literature along with all the possible adverse reactions/side effects for all the Medicines you  take and that have been prescribed to you. Take any new Medicines after you have completely understood and accpet all the possible adverse reactions/side effects.   Do not drive or operate heavy machinery when taking Pain medications.   Do not take more than prescribed Pain, Sleep and Anxiety Medications  Special Instructions: If you have smoked or chewed Tobacco  in the last 2 yrs please stop smoking, stop any regular  Alcohol  and or any Recreational drug use.  Wear Seat belts while driving.  Please note You were cared for by a hospitalist during your hospital stay. If you have any questions about your discharge medications or the care you received while you were in the hospital after you are discharged, you can call the unit and asked to speak with the hospitalist on call if the hospitalist that took care of you is not available. Once you are discharged, your primary care physician will handle any further medical issues. Please note that NO REFILLS for any discharge medications will be authorized once you are discharged, as it is imperative that you return to your primary care physician (or establish a relationship with a primary care physician if you do not have one) for your aftercare needs so that they can reassess your need for medications and monitor your lab values.  You can reach the hospitalist office at phone [phone] or fax [phone]   If you do not have a primary care physician, you can call [phone] for a physician referral.  Activity: As tolerated with Full fall precautions use walker/cane & assistance as needed    Diet: regular  Disposition Home

## 2022-10-23 LAB — HEMOGLOBIN A1C
Hgb A1c MFr Bld: 6 % — ABNORMAL HIGH (ref 4.8–5.6)
Mean Plasma Glucose: 126 mg/dL

## 2022-10-26 ENCOUNTER — Other Ambulatory Visit: Payer: Self-pay | Admitting: Cardiovascular Disease

## 2022-10-26 ENCOUNTER — Other Ambulatory Visit: Payer: Self-pay | Admitting: Primary Care

## 2022-10-26 DIAGNOSIS — D649 Anemia, unspecified: Secondary | ICD-10-CM | POA: Diagnosis not present

## 2022-10-26 DIAGNOSIS — M1612 Unilateral primary osteoarthritis, left hip: Secondary | ICD-10-CM | POA: Diagnosis not present

## 2022-10-26 DIAGNOSIS — Z79899 Other long term (current) drug therapy: Secondary | ICD-10-CM | POA: Diagnosis not present

## 2022-10-26 DIAGNOSIS — Z952 Presence of prosthetic heart valve: Secondary | ICD-10-CM | POA: Diagnosis not present

## 2022-10-26 DIAGNOSIS — Z96641 Presence of right artificial hip joint: Secondary | ICD-10-CM | POA: Diagnosis not present

## 2022-10-26 DIAGNOSIS — Z950 Presence of cardiac pacemaker: Secondary | ICD-10-CM | POA: Diagnosis not present

## 2022-10-26 DIAGNOSIS — G43909 Migraine, unspecified, not intractable, without status migrainosus: Secondary | ICD-10-CM | POA: Diagnosis not present

## 2022-10-26 DIAGNOSIS — M792 Neuralgia and neuritis, unspecified: Secondary | ICD-10-CM | POA: Diagnosis not present

## 2022-10-26 DIAGNOSIS — Z7982 Long term (current) use of aspirin: Secondary | ICD-10-CM | POA: Diagnosis not present

## 2022-10-26 DIAGNOSIS — J4530 Mild persistent asthma, uncomplicated: Secondary | ICD-10-CM | POA: Diagnosis not present

## 2022-10-26 DIAGNOSIS — E785 Hyperlipidemia, unspecified: Secondary | ICD-10-CM | POA: Diagnosis not present

## 2022-10-26 DIAGNOSIS — I2510 Atherosclerotic heart disease of native coronary artery without angina pectoris: Secondary | ICD-10-CM | POA: Diagnosis not present

## 2022-10-26 DIAGNOSIS — G309 Alzheimer's disease, unspecified: Secondary | ICD-10-CM | POA: Diagnosis not present

## 2022-10-26 DIAGNOSIS — I714 Abdominal aortic aneurysm, without rupture, unspecified: Secondary | ICD-10-CM | POA: Diagnosis not present

## 2022-10-26 DIAGNOSIS — I447 Left bundle-branch block, unspecified: Secondary | ICD-10-CM | POA: Diagnosis not present

## 2022-10-26 DIAGNOSIS — I441 Atrioventricular block, second degree: Secondary | ICD-10-CM | POA: Diagnosis not present

## 2022-10-26 DIAGNOSIS — Z951 Presence of aortocoronary bypass graft: Secondary | ICD-10-CM | POA: Diagnosis not present

## 2022-10-26 DIAGNOSIS — R350 Frequency of micturition: Secondary | ICD-10-CM | POA: Diagnosis not present

## 2022-10-26 DIAGNOSIS — I129 Hypertensive chronic kidney disease with stage 1 through stage 4 chronic kidney disease, or unspecified chronic kidney disease: Secondary | ICD-10-CM | POA: Diagnosis not present

## 2022-10-26 DIAGNOSIS — F0283 Dementia in other diseases classified elsewhere, unspecified severity, with mood disturbance: Secondary | ICD-10-CM | POA: Diagnosis not present

## 2022-10-26 DIAGNOSIS — N183 Chronic kidney disease, stage 3 unspecified: Secondary | ICD-10-CM | POA: Diagnosis not present

## 2022-10-26 DIAGNOSIS — E039 Hypothyroidism, unspecified: Secondary | ICD-10-CM | POA: Diagnosis not present

## 2022-10-26 DIAGNOSIS — M810 Age-related osteoporosis without current pathological fracture: Secondary | ICD-10-CM | POA: Diagnosis not present

## 2022-10-29 ENCOUNTER — Emergency Department: Payer: Medicare Other

## 2022-10-29 ENCOUNTER — Inpatient Hospital Stay
Admission: EM | Admit: 2022-10-29 | Discharge: 2022-10-30 | DRG: 536 | Disposition: A | Discharge status: Other Acute Inpatient Hospital | Payer: Medicare Other | Source: Other Acute Inpatient Hospital | Attending: Family Medicine | Admitting: Family Medicine

## 2022-10-29 ENCOUNTER — Encounter (HOSPITAL_COMMUNITY): Payer: Self-pay

## 2022-10-29 ENCOUNTER — Other Ambulatory Visit: Payer: Self-pay

## 2022-10-29 DIAGNOSIS — Z950 Presence of cardiac pacemaker: Secondary | ICD-10-CM

## 2022-10-29 DIAGNOSIS — R4182 Altered mental status, unspecified: Secondary | ICD-10-CM | POA: Diagnosis not present

## 2022-10-29 DIAGNOSIS — N1831 Chronic kidney disease, stage 3a: Secondary | ICD-10-CM | POA: Diagnosis present

## 2022-10-29 DIAGNOSIS — Z8616 Personal history of COVID-19: Secondary | ICD-10-CM

## 2022-10-29 DIAGNOSIS — Z952 Presence of prosthetic heart valve: Secondary | ICD-10-CM | POA: Diagnosis not present

## 2022-10-29 DIAGNOSIS — I10 Essential (primary) hypertension: Secondary | ICD-10-CM | POA: Diagnosis not present

## 2022-10-29 DIAGNOSIS — I469 Cardiac arrest, cause unspecified: Secondary | ICD-10-CM | POA: Diagnosis not present

## 2022-10-29 DIAGNOSIS — K449 Diaphragmatic hernia without obstruction or gangrene: Secondary | ICD-10-CM | POA: Diagnosis not present

## 2022-10-29 DIAGNOSIS — I499 Cardiac arrhythmia, unspecified: Secondary | ICD-10-CM | POA: Diagnosis not present

## 2022-10-29 DIAGNOSIS — W1830XA Fall on same level, unspecified, initial encounter: Secondary | ICD-10-CM | POA: Diagnosis present

## 2022-10-29 DIAGNOSIS — Z951 Presence of aortocoronary bypass graft: Secondary | ICD-10-CM | POA: Diagnosis not present

## 2022-10-29 DIAGNOSIS — I6523 Occlusion and stenosis of bilateral carotid arteries: Secondary | ICD-10-CM | POA: Diagnosis not present

## 2022-10-29 DIAGNOSIS — I129 Hypertensive chronic kidney disease with stage 1 through stage 4 chronic kidney disease, or unspecified chronic kidney disease: Secondary | ICD-10-CM | POA: Diagnosis present

## 2022-10-29 DIAGNOSIS — I2510 Atherosclerotic heart disease of native coronary artery without angina pectoris: Secondary | ICD-10-CM | POA: Diagnosis present

## 2022-10-29 DIAGNOSIS — R110 Nausea: Secondary | ICD-10-CM | POA: Diagnosis not present

## 2022-10-29 DIAGNOSIS — E039 Hypothyroidism, unspecified: Secondary | ICD-10-CM | POA: Diagnosis present

## 2022-10-29 DIAGNOSIS — D62 Acute posthemorrhagic anemia: Secondary | ICD-10-CM | POA: Diagnosis not present

## 2022-10-29 DIAGNOSIS — G8194 Hemiplegia, unspecified affecting left nondominant side: Secondary | ICD-10-CM | POA: Diagnosis present

## 2022-10-29 DIAGNOSIS — I358 Other nonrheumatic aortic valve disorders: Secondary | ICD-10-CM | POA: Diagnosis not present

## 2022-10-29 DIAGNOSIS — S0990XA Unspecified injury of head, initial encounter: Secondary | ICD-10-CM | POA: Diagnosis not present

## 2022-10-29 DIAGNOSIS — M25552 Pain in left hip: Secondary | ICD-10-CM | POA: Diagnosis not present

## 2022-10-29 DIAGNOSIS — R4701 Aphasia: Secondary | ICD-10-CM | POA: Diagnosis not present

## 2022-10-29 DIAGNOSIS — R6889 Other general symptoms and signs: Secondary | ICD-10-CM | POA: Diagnosis not present

## 2022-10-29 DIAGNOSIS — S72002A Fracture of unspecified part of neck of left femur, initial encounter for closed fracture: Secondary | ICD-10-CM | POA: Diagnosis present

## 2022-10-29 DIAGNOSIS — Z743 Need for continuous supervision: Secondary | ICD-10-CM | POA: Diagnosis not present

## 2022-10-29 DIAGNOSIS — R2990 Unspecified symptoms and signs involving the nervous system: Secondary | ICD-10-CM | POA: Diagnosis not present

## 2022-10-29 DIAGNOSIS — F0390 Unspecified dementia without behavioral disturbance: Secondary | ICD-10-CM | POA: Diagnosis present

## 2022-10-29 DIAGNOSIS — G319 Degenerative disease of nervous system, unspecified: Secondary | ICD-10-CM | POA: Diagnosis not present

## 2022-10-29 DIAGNOSIS — Z8673 Personal history of transient ischemic attack (TIA), and cerebral infarction without residual deficits: Secondary | ICD-10-CM

## 2022-10-29 DIAGNOSIS — R079 Chest pain, unspecified: Secondary | ICD-10-CM | POA: Diagnosis not present

## 2022-10-29 DIAGNOSIS — E785 Hyperlipidemia, unspecified: Secondary | ICD-10-CM | POA: Diagnosis present

## 2022-10-29 DIAGNOSIS — M4312 Spondylolisthesis, cervical region: Secondary | ICD-10-CM | POA: Diagnosis not present

## 2022-10-29 LAB — COMPREHENSIVE METABOLIC PANEL
ALT: 46 U/L — ABNORMAL HIGH (ref 0–44)
AST: 44 U/L — ABNORMAL HIGH (ref 15–41)
Albumin: 4.3 g/dL (ref 3.5–5.0)
Alkaline Phosphatase: 80 U/L (ref 38–126)
Anion gap: 15 (ref 5–15)
BUN: 15 mg/dL (ref 8–23)
CO2: 25 mmol/L (ref 22–32)
Calcium: 9.1 mg/dL (ref 8.9–10.3)
Chloride: 95 mmol/L — ABNORMAL LOW (ref 98–111)
Creatinine, Ser: 0.99 mg/dL (ref 0.44–1.00)
GFR, Estimated: 56 mL/min — ABNORMAL LOW (ref >60)
Glucose, Bld: 136 mg/dL — ABNORMAL HIGH (ref 70–99)
Potassium: 2.9 mmol/L — ABNORMAL LOW (ref 3.5–5.1)
Sodium: 135 mmol/L (ref 135–145)
Total Bilirubin: 1.4 mg/dL — ABNORMAL HIGH (ref 0.3–1.2)
Total Protein: 7.6 g/dL (ref 6.5–8.1)

## 2022-10-29 LAB — URINALYSIS, ROUTINE W REFLEX MICROSCOPIC
Bilirubin Urine: NEGATIVE
Glucose, UA: NEGATIVE mg/dL
Hgb urine dipstick: NEGATIVE
Ketones, ur: NEGATIVE mg/dL
Leukocytes,Ua: NEGATIVE
Nitrite: NEGATIVE
Protein, ur: NEGATIVE mg/dL
Specific Gravity, Urine: 1.009 (ref 1.005–1.030)
pH: 6 (ref 5.0–8.0)

## 2022-10-29 LAB — CBC WITH DIFFERENTIAL/PLATELET
Abs Immature Granulocytes: 0.1 10*3/uL — ABNORMAL HIGH (ref 0.00–0.07)
Basophils Absolute: 0 10*3/uL (ref 0.0–0.1)
Basophils Relative: 0 %
Eosinophils Absolute: 0 10*3/uL (ref 0.0–0.5)
Eosinophils Relative: 0 %
HCT: 43.4 % (ref 36.0–46.0)
Hemoglobin: 15 g/dL (ref 12.0–15.0)
Immature Granulocytes: 1 %
Lymphocytes Relative: 4 %
Lymphs Abs: 0.5 10*3/uL — ABNORMAL LOW (ref 0.7–4.0)
MCH: 30.6 pg (ref 26.0–34.0)
MCHC: 34.6 g/dL (ref 30.0–36.0)
MCV: 88.6 fL (ref 80.0–100.0)
Monocytes Absolute: 0.8 10*3/uL (ref 0.1–1.0)
Monocytes Relative: 6 %
Neutro Abs: 11.8 10*3/uL — ABNORMAL HIGH (ref 1.7–7.7)
Neutrophils Relative %: 89 %
Platelets: 240 10*3/uL (ref 150–400)
RBC: 4.9 MIL/uL (ref 3.87–5.11)
RDW: 12.6 % (ref 11.5–15.5)
WBC: 13.2 10*3/uL — ABNORMAL HIGH (ref 4.0–10.5)
nRBC: 0 % (ref 0.0–0.2)

## 2022-10-29 LAB — LIPASE, BLOOD: Lipase: 30 U/L (ref 11–51)

## 2022-10-29 LAB — PROTIME-INR
INR: 1 (ref 0.8–1.2)
Prothrombin Time: 13.1 seconds (ref 11.4–15.2)

## 2022-10-29 MED ORDER — SODIUM CHLORIDE 0.9 % IV SOLN: Freq: Once | INTRAVENOUS | Status: AC

## 2022-10-29 MED ORDER — ACETAMINOPHEN 325 MG PO TABS
650.0000 mg | ORAL_TABLET | Freq: Once | ORAL | Status: AC
  Administered 2022-10-29: 650 mg via ORAL
  Filled 2022-10-29: qty 2

## 2022-10-29 MED ORDER — HYDROMORPHONE HCL 1 MG/ML IJ SOLN
0.5000 mg | Freq: Once | INTRAMUSCULAR | Status: AC
  Administered 2022-10-29: 0.5 mg via INTRAVENOUS
  Filled 2022-10-29: qty 0.5

## 2022-10-29 MED ORDER — POTASSIUM CHLORIDE CRYS ER 20 MEQ PO TBCR
40.0000 meq | EXTENDED_RELEASE_TABLET | Freq: Once | ORAL | Status: AC
  Administered 2022-10-29: 40 meq via ORAL
  Filled 2022-10-29: qty 2

## 2022-10-29 MED ORDER — ACETAMINOPHEN 500 MG PO TABS
1000.0000 mg | ORAL_TABLET | Freq: Once | ORAL | Status: AC
  Administered 2022-10-29: 1000 mg via ORAL
  Filled 2022-10-29: qty 2

## 2022-10-29 MED ORDER — HYDROMORPHONE HCL 1 MG/ML IJ SOLN: 0.5000 mg | INTRAMUSCULAR | Status: DC | PRN

## 2022-10-29 MED ORDER — ONDANSETRON HCL 4 MG/2ML IJ SOLN
4.0000 mg | Freq: Once | INTRAMUSCULAR | Status: AC
  Administered 2022-10-29: 4 mg via INTRAVENOUS
  Filled 2022-10-29: qty 2

## 2022-10-29 NOTE — ED Notes (Signed)
Called [facility] for update on bed availability, still no idea on when it will be available per Ruby.

## 2022-10-29 NOTE — ED Notes (Signed)
RN cleaned pt, changed chuck pads, and repositioned patient.

## 2022-10-29 NOTE — ED Provider Notes (Signed)
[facility] Provider Note    Event Date/Time   First MD Initiated Contact with Patient 10/29/22 1335     (approximate)   History   Fall   HPI  [name] is a 89 y.o. female with fairly complex past medical history including recent admission for strokelike symptoms which were thought due to COVID-19 here with left hip pain and recurrent left-sided weakness.  Regarding her hip pain, the patient reportedly is fallen twice in the last 24 hours at her facility.  She was found down today.  She has been complaining of severe left hip pain with wincing anytime the left hip is moved.  She does not normally complain of pain so family believes this is a significant amount.  Regarding her weakness, she has had recurrent episodes of left-sided weakness as well as what sounds like an expressive aphasia.  These been ongoing since her recent admission.  She was taken off Plavix.  She is on aspirin only.  Denies known history of A-fib.  On my assessment, patient complains of left hip pain but history is otherwise limited due to dementia.     Physical Exam   Triage Vital Signs: ED Triage Vitals  Enc Vitals Group     BP 10/29/22 1329 (!) 179/86     Pulse Rate 10/29/22 1329 63     Resp 10/29/22 1329 18     Temp 10/29/22 1329 98.6 F (37 C)     Temp Source 10/29/22 1329 Oral     SpO2 10/29/22 1318 97 %     Weight --      Height --      Head Circumference --      Peak Flow --      Pain Score 10/29/22 1321 9     Pain Loc --      Pain Edu? --      Excl. in [place]? --     Most recent vital signs: Vitals:   10/29/22 2000 10/29/22 2116  BP: 127/76   Pulse: 85   Resp: 19   Temp:  98.5 F (36.9 C)  SpO2: 98%      General: Awake, no distress.  CV:  Good peripheral perfusion. Irregular rhythm. Resp:  Normal effort. Lungs clear. Abd:  No distention. Other:  Subtle left NLF, strength at least antigravity b/l UE and LE. Sensation difficult to assess given mental  status. LLE shortened, externally rotated. 2+ DP/PT pulses.    ED Results / Procedures / Treatments   Labs (all labs ordered are listed, but only abnormal results are displayed) Labs Reviewed  CBC WITH DIFFERENTIAL/PLATELET - Abnormal; Notable for the following components:      Result Value   WBC 13.2 (*)    Neutro Abs 11.8 (*)    Lymphs Abs 0.5 (*)    Abs Immature Granulocytes 0.10 (*)    All other components within normal limits  COMPREHENSIVE METABOLIC PANEL - Abnormal; Notable for the following components:   Potassium 2.9 (*)    Chloride 95 (*)    Glucose, Bld 136 (*)    AST 44 (*)    ALT 46 (*)    Total Bilirubin 1.4 (*)    GFR, Estimated 56 (*)    All other components within normal limits  URINALYSIS, ROUTINE W REFLEX MICROSCOPIC - Abnormal; Notable for the following components:   Color, Urine YELLOW (*)    APPearance CLEAR (*)    All other components within normal  limits  PROTIME-INR  LIPASE, BLOOD     EKG pending   RADIOLOGY Chest x-ray: No acute process DG hip left: Acute left femoral neck fracture CT head/C-spine: No acute abnormality   I also independently reviewed and agree with radiologist interpretations.   PROCEDURES:  Critical Care performed: No   MEDICATIONS ORDERED IN ED: Medications  0.9 %  sodium chloride infusion ( Intravenous New Bag/Given 10/29/22 1658)  acetaminophen (TYLENOL) tablet 1,000 mg (1,000 mg Oral Given 10/29/22 1759)  potassium chloride SA (KLOR-CON M) CR tablet 40 mEq (40 mEq Oral Given 10/29/22 1950)  acetaminophen (TYLENOL) tablet 650 mg (650 mg Oral Given 10/29/22 2151)     IMPRESSION / MDM / ASSESSMENT AND PLAN / ED COURSE  I reviewed the triage vital signs and the nursing notes.                              Differential diagnosis includes, but is not limited to, hip fx, contusion, sprain, falls due to recurrent CVA, occult infection such as UTI, PNA, anemia, polypharmacy.  Patient's presentation is most consistent  with acute presentation with potential threat to life or bodily function.  The patient is on the cardiac monitor to evaluate for evidence of arrhythmia and/or significant heart rate changes.  89 year old female here with hip pain and recurrent aphasia and left-sided weakness.  Regarding her hip pain, plain films show acute left femoral neck fracture.  This will need operative repair.  Regarding her recurrent weakness, clinically concern for possible recurrent CVAs.  She was unable to obtain an MRI during her previous admission due to her pacemaker.  She would be able to get an MRI at [facility] per my discussion with radiology.  Given that this may change her anticoagulation plans as well as restratification for surgery with need to workup recurrent focal neurological deficits, feel she would best be treated at [facility].  Will plan to obtain screening lab work, touch base with neurology as well as orthopedics, and dispo accordingly.  Discussed with Dr. Tamera Punt of orthopedics at [facility] who is willing to see the patient there.  Discussed with Dr. Quinn Axe of neurology here at [facility] who agrees patient will need MRI.  Confirmed that this cannot be done here.  Will plan to follow-up labs and work on admission to [place] IMPRESSION(S) / ED DIAGNOSES   Final diagnoses:  Closed fracture of left hip, initial encounter [facility])     Rx / DC Orders   ED Discharge Orders     None        Note:  This document was prepared using Dragon voice recognition software and may include unintentional dictation errors.   Duffy Bruce, MD 10/29/22 2214

## 2022-10-29 NOTE — ED Notes (Signed)
[facility]  called   per  DR Ellender Hose  MD

## 2022-10-29 NOTE — Progress Notes (Signed)
Plan of Care Note for accepted transfer   Patient: [name] MRN: [id number]   DOA: 10/29/2022  Facility requesting transfer: [facility] Requesting Provider: Dr. Ellender Hose Reason for transfer: acute left femoral neck fracture and left sided weakness/aphasia.  Facility course: 89 year old female with medical history significant of pacemaker placement, HTN, HLD, asthma, CAD, CABG, stroke, hypothyroidism, CKD-3A, left bundle blockade, s/p of AVR, dementia who was recently admitted 12/28-12/29 for AMS and cough due to covid who presented to [facility] ED with left hip pain and left sided leg weakness and difficulty speaking.  Due to the left sided weakness she fell.  Found to have acute left femoral neck fracture. Also has episodes of expressive aphasia and left sided weakness, but difficult with her dementia to assess. At her last hospitalization, no MRI was obtained due to PPM and thought symptoms was due to covid vs. TIA. Discharged on ASA alone  Ortho consulted, Dr. Tamera Punt, who will see patient at [facility].  Neurology, Dr. Livia Snellen, also recommended MRI which can be done at [facility], especially if she needs surgery.   Plan of care: The patient is accepted for admission to Telemetry unit, at [place], Dr. Tamera Punt, know when arrives -neuro with stroke like symptoms. needs MRI for stroke rule out especially with need for surgery   Author: Orma Flaming, MD 10/29/2022  Check [email or web address] for on-call coverage.  Nursing staff, Please call [place] number on Amion as soon as patient's arrival, so appropriate admitting provider can evaluate the pt.

## 2022-10-29 NOTE — Telephone Encounter (Signed)
    Daughter called Answering Service with concerns about atrial fibrillation. Called and spoke with daughter. Patient is currently at the [facility] ED. She presented after all but family also reported left sided leg weakness and difficulty speaking. She was found to have an acute left femoral neck fracture. Head CT showed no acute intracranial process but there was concern for stroke. Unfortunately unable to get brain MRI due to PPM. Plan is to transfer her to [facility] where she would be able to get an MRI. She was noted to have an irregular heart rate and ED provider said she was in atrial fibrillation. Daughter is concerned. I am able to see EKG from the ED and it does show an irregular rhythm; however, I can see multiple P waves so not convinced that this is atrial fibrillation based of EKG in system at this time. However, unable to review telemetry.  Explained that I suspect they will consult [place] when she gets to [facility] for possible atrial fibrillation as well as for pre-op evaluation for possible hip surgery. However, if not they can always ask primary team to consult [place]. Also recommended asking ED provider to interrogate PPM while in the ED to help [place] determine rhythm and whether she has had any atrial fibrillation.  Daughter was very appreciate of the call.  Darreld Mclean, PA-C 10/29/2022 8:25 PM

## 2022-10-30 ENCOUNTER — Encounter (HOSPITAL_COMMUNITY): Payer: Self-pay | Admitting: Family Medicine

## 2022-10-30 ENCOUNTER — Inpatient Hospital Stay (HOSPITAL_COMMUNITY)
Admission: RE | Admit: 2022-10-30 | Discharge: 2022-11-08 | DRG: 522 | Disposition: A | Discharge status: Skilled Nursing Facility | Payer: Medicare Other | Source: Other Acute Inpatient Hospital | Attending: Internal Medicine | Admitting: Internal Medicine

## 2022-10-30 ENCOUNTER — Inpatient Hospital Stay (HOSPITAL_COMMUNITY): Payer: Medicare Other

## 2022-10-30 ENCOUNTER — Other Ambulatory Visit: Payer: Self-pay

## 2022-10-30 DIAGNOSIS — D62 Acute posthemorrhagic anemia: Secondary | ICD-10-CM | POA: Diagnosis not present

## 2022-10-30 DIAGNOSIS — Z7401 Bed confinement status: Secondary | ICD-10-CM | POA: Diagnosis not present

## 2022-10-30 DIAGNOSIS — S72042A Displaced fracture of base of neck of left femur, initial encounter for closed fracture: Secondary | ICD-10-CM | POA: Diagnosis not present

## 2022-10-30 DIAGNOSIS — E871 Hypo-osmolality and hyponatremia: Secondary | ICD-10-CM | POA: Diagnosis not present

## 2022-10-30 DIAGNOSIS — I499 Cardiac arrhythmia, unspecified: Secondary | ICD-10-CM | POA: Diagnosis not present

## 2022-10-30 DIAGNOSIS — R29810 Facial weakness: Secondary | ICD-10-CM | POA: Diagnosis present

## 2022-10-30 DIAGNOSIS — R4182 Altered mental status, unspecified: Secondary | ICD-10-CM | POA: Diagnosis not present

## 2022-10-30 DIAGNOSIS — E039 Hypothyroidism, unspecified: Secondary | ICD-10-CM | POA: Diagnosis not present

## 2022-10-30 DIAGNOSIS — Z96641 Presence of right artificial hip joint: Secondary | ICD-10-CM | POA: Diagnosis present

## 2022-10-30 DIAGNOSIS — Z7982 Long term (current) use of aspirin: Secondary | ICD-10-CM

## 2022-10-30 DIAGNOSIS — I2510 Atherosclerotic heart disease of native coronary artery without angina pectoris: Secondary | ICD-10-CM

## 2022-10-30 DIAGNOSIS — I4719 Other supraventricular tachycardia: Secondary | ICD-10-CM | POA: Diagnosis present

## 2022-10-30 DIAGNOSIS — Z743 Need for continuous supervision: Secondary | ICD-10-CM | POA: Diagnosis not present

## 2022-10-30 DIAGNOSIS — I358 Other nonrheumatic aortic valve disorders: Secondary | ICD-10-CM | POA: Diagnosis not present

## 2022-10-30 DIAGNOSIS — G309 Alzheimer's disease, unspecified: Secondary | ICD-10-CM | POA: Diagnosis not present

## 2022-10-30 DIAGNOSIS — Z8616 Personal history of COVID-19: Secondary | ICD-10-CM

## 2022-10-30 DIAGNOSIS — Z9181 History of falling: Secondary | ICD-10-CM | POA: Diagnosis not present

## 2022-10-30 DIAGNOSIS — W1830XA Fall on same level, unspecified, initial encounter: Secondary | ICD-10-CM | POA: Diagnosis present

## 2022-10-30 DIAGNOSIS — Z9842 Cataract extraction status, left eye: Secondary | ICD-10-CM

## 2022-10-30 DIAGNOSIS — F0390 Unspecified dementia without behavioral disturbance: Secondary | ICD-10-CM | POA: Diagnosis present

## 2022-10-30 DIAGNOSIS — Z96642 Presence of left artificial hip joint: Secondary | ICD-10-CM

## 2022-10-30 DIAGNOSIS — N1831 Chronic kidney disease, stage 3a: Secondary | ICD-10-CM | POA: Diagnosis present

## 2022-10-30 DIAGNOSIS — Z952 Presence of prosthetic heart valve: Secondary | ICD-10-CM | POA: Diagnosis not present

## 2022-10-30 DIAGNOSIS — S72002D Fracture of unspecified part of neck of left femur, subsequent encounter for closed fracture with routine healing: Secondary | ICD-10-CM | POA: Diagnosis not present

## 2022-10-30 DIAGNOSIS — F0282 Dementia in other diseases classified elsewhere, unspecified severity, with psychotic disturbance: Secondary | ICD-10-CM | POA: Diagnosis not present

## 2022-10-30 DIAGNOSIS — I441 Atrioventricular block, second degree: Secondary | ICD-10-CM | POA: Diagnosis not present

## 2022-10-30 DIAGNOSIS — D72829 Elevated white blood cell count, unspecified: Secondary | ICD-10-CM | POA: Diagnosis present

## 2022-10-30 DIAGNOSIS — I10 Essential (primary) hypertension: Secondary | ICD-10-CM | POA: Diagnosis not present

## 2022-10-30 DIAGNOSIS — Z950 Presence of cardiac pacemaker: Secondary | ICD-10-CM | POA: Diagnosis not present

## 2022-10-30 DIAGNOSIS — I130 Hypertensive heart and chronic kidney disease with heart failure and stage 1 through stage 4 chronic kidney disease, or unspecified chronic kidney disease: Secondary | ICD-10-CM | POA: Diagnosis present

## 2022-10-30 DIAGNOSIS — M25552 Pain in left hip: Secondary | ICD-10-CM | POA: Diagnosis present

## 2022-10-30 DIAGNOSIS — I341 Nonrheumatic mitral (valve) prolapse: Secondary | ICD-10-CM | POA: Diagnosis present

## 2022-10-30 DIAGNOSIS — S72002A Fracture of unspecified part of neck of left femur, initial encounter for closed fracture: Secondary | ICD-10-CM | POA: Diagnosis not present

## 2022-10-30 DIAGNOSIS — T502X5A Adverse effect of carbonic-anhydrase inhibitors, benzothiadiazides and other diuretics, initial encounter: Secondary | ICD-10-CM | POA: Diagnosis present

## 2022-10-30 DIAGNOSIS — Z808 Family history of malignant neoplasm of other organs or systems: Secondary | ICD-10-CM

## 2022-10-30 DIAGNOSIS — R011 Cardiac murmur, unspecified: Secondary | ICD-10-CM | POA: Diagnosis present

## 2022-10-30 DIAGNOSIS — Z90710 Acquired absence of both cervix and uterus: Secondary | ICD-10-CM

## 2022-10-30 DIAGNOSIS — Z7951 Long term (current) use of inhaled steroids: Secondary | ICD-10-CM

## 2022-10-30 DIAGNOSIS — Y92129 Unspecified place in nursing home as the place of occurrence of the external cause: Secondary | ICD-10-CM | POA: Diagnosis not present

## 2022-10-30 DIAGNOSIS — Z961 Presence of intraocular lens: Secondary | ICD-10-CM | POA: Diagnosis present

## 2022-10-30 DIAGNOSIS — I452 Bifascicular block: Secondary | ICD-10-CM | POA: Diagnosis not present

## 2022-10-30 DIAGNOSIS — Z951 Presence of aortocoronary bypass graft: Secondary | ICD-10-CM | POA: Diagnosis not present

## 2022-10-30 DIAGNOSIS — M879 Osteonecrosis, unspecified: Secondary | ICD-10-CM | POA: Diagnosis not present

## 2022-10-30 DIAGNOSIS — Z66 Do not resuscitate: Secondary | ICD-10-CM | POA: Diagnosis present

## 2022-10-30 DIAGNOSIS — E876 Hypokalemia: Secondary | ICD-10-CM | POA: Diagnosis not present

## 2022-10-30 DIAGNOSIS — Z79890 Hormone replacement therapy (postmenopausal): Secondary | ICD-10-CM

## 2022-10-30 DIAGNOSIS — Z953 Presence of xenogenic heart valve: Secondary | ICD-10-CM

## 2022-10-30 DIAGNOSIS — Z885 Allergy status to narcotic agent status: Secondary | ICD-10-CM

## 2022-10-30 DIAGNOSIS — Z83438 Family history of other disorder of lipoprotein metabolism and other lipidemia: Secondary | ICD-10-CM

## 2022-10-30 DIAGNOSIS — I5032 Chronic diastolic (congestive) heart failure: Secondary | ICD-10-CM | POA: Diagnosis present

## 2022-10-30 DIAGNOSIS — R4701 Aphasia: Secondary | ICD-10-CM | POA: Diagnosis not present

## 2022-10-30 DIAGNOSIS — E785 Hyperlipidemia, unspecified: Secondary | ICD-10-CM | POA: Diagnosis present

## 2022-10-30 DIAGNOSIS — G8192 Hemiplegia, unspecified affecting left dominant side: Secondary | ICD-10-CM | POA: Diagnosis not present

## 2022-10-30 DIAGNOSIS — Z8249 Family history of ischemic heart disease and other diseases of the circulatory system: Secondary | ICD-10-CM

## 2022-10-30 DIAGNOSIS — M159 Polyosteoarthritis, unspecified: Secondary | ICD-10-CM | POA: Diagnosis present

## 2022-10-30 DIAGNOSIS — W19XXXA Unspecified fall, initial encounter: Secondary | ICD-10-CM | POA: Diagnosis not present

## 2022-10-30 DIAGNOSIS — R404 Transient alteration of awareness: Secondary | ICD-10-CM | POA: Diagnosis not present

## 2022-10-30 DIAGNOSIS — I447 Left bundle-branch block, unspecified: Secondary | ICD-10-CM | POA: Diagnosis present

## 2022-10-30 DIAGNOSIS — R471 Dysarthria and anarthria: Secondary | ICD-10-CM | POA: Diagnosis present

## 2022-10-30 DIAGNOSIS — I4891 Unspecified atrial fibrillation: Secondary | ICD-10-CM | POA: Diagnosis present

## 2022-10-30 DIAGNOSIS — Z79899 Other long term (current) drug therapy: Secondary | ICD-10-CM

## 2022-10-30 DIAGNOSIS — R2990 Unspecified symptoms and signs involving the nervous system: Principal | ICD-10-CM | POA: Diagnosis present

## 2022-10-30 DIAGNOSIS — I714 Abdominal aortic aneurysm, without rupture, unspecified: Secondary | ICD-10-CM | POA: Diagnosis present

## 2022-10-30 DIAGNOSIS — Z8673 Personal history of transient ischemic attack (TIA), and cerebral infarction without residual deficits: Secondary | ICD-10-CM

## 2022-10-30 DIAGNOSIS — J4489 Other specified chronic obstructive pulmonary disease: Secondary | ICD-10-CM | POA: Diagnosis present

## 2022-10-30 DIAGNOSIS — S72009A Fracture of unspecified part of neck of unspecified femur, initial encounter for closed fracture: Secondary | ICD-10-CM | POA: Insufficient documentation

## 2022-10-30 LAB — COMPREHENSIVE METABOLIC PANEL
ALT: 44 U/L (ref 0–44)
AST: 33 U/L (ref 15–41)
Albumin: 3.2 g/dL — ABNORMAL LOW (ref 3.5–5.0)
Alkaline Phosphatase: 62 U/L (ref 38–126)
Anion gap: 12 (ref 5–15)
BUN: 12 mg/dL (ref 8–23)
CO2: 23 mmol/L (ref 22–32)
Calcium: 8 mg/dL — ABNORMAL LOW (ref 8.9–10.3)
Chloride: 99 mmol/L (ref 98–111)
Creatinine, Ser: 1.07 mg/dL — ABNORMAL HIGH (ref 0.44–1.00)
GFR, Estimated: 51 mL/min — ABNORMAL LOW (ref >60)
Glucose, Bld: 148 mg/dL — ABNORMAL HIGH (ref 70–99)
Potassium: 3.4 mmol/L — ABNORMAL LOW (ref 3.5–5.1)
Sodium: 134 mmol/L — ABNORMAL LOW (ref 135–145)
Total Bilirubin: 1.1 mg/dL (ref 0.3–1.2)
Total Protein: 5.9 g/dL — ABNORMAL LOW (ref 6.5–8.1)

## 2022-10-30 LAB — CBC
HCT: 36.6 % (ref 36.0–46.0)
Hemoglobin: 12.9 g/dL (ref 12.0–15.0)
MCH: 31.4 pg (ref 26.0–34.0)
MCHC: 35.2 g/dL (ref 30.0–36.0)
MCV: 89.1 fL (ref 80.0–100.0)
Platelets: 181 10*3/uL (ref 150–400)
RBC: 4.11 MIL/uL (ref 3.87–5.11)
RDW: 12.9 % (ref 11.5–15.5)
WBC: 7.6 10*3/uL (ref 4.0–10.5)
nRBC: 0 % (ref 0.0–0.2)

## 2022-10-30 LAB — MAGNESIUM: Magnesium: 1.7 mg/dL (ref 1.7–2.4)

## 2022-10-30 MED ORDER — HYDROMORPHONE HCL 1 MG/ML IJ SOLN
0.5000 mg | INTRAMUSCULAR | Status: DC | PRN
  Administered 2022-10-30 – 2022-10-31 (×4): 0.5 mg via INTRAVENOUS
  Filled 2022-10-30 (×4): qty 0.5

## 2022-10-30 MED ORDER — CHLORHEXIDINE GLUCONATE 4 % EX LIQD: 60.0000 mL | Freq: Once | CUTANEOUS | Status: DC

## 2022-10-30 MED ORDER — SODIUM CHLORIDE 0.9 % IV SOLN: INTRAVENOUS | Status: DC

## 2022-10-30 MED ORDER — ACETAMINOPHEN 325 MG PO TABS
650.0000 mg | ORAL_TABLET | Freq: Four times a day (QID) | ORAL | Status: DC | PRN
  Administered 2022-10-30: 650 mg via ORAL
  Filled 2022-10-30: qty 2

## 2022-10-30 MED ORDER — POTASSIUM CHLORIDE 10 MEQ/100ML IV SOLN
10.0000 meq | Freq: Once | INTRAVENOUS | Status: AC
  Administered 2022-10-30: 10 meq via INTRAVENOUS

## 2022-10-30 MED ORDER — ACETAMINOPHEN 650 MG RE SUPP: 650.0000 mg | Freq: Four times a day (QID) | RECTAL | Status: DC | PRN

## 2022-10-30 MED ORDER — ONDANSETRON HCL 4 MG/2ML IJ SOLN
4.0000 mg | Freq: Four times a day (QID) | INTRAMUSCULAR | Status: DC | PRN
  Filled 2022-10-30: qty 2

## 2022-10-30 MED ORDER — TRANEXAMIC ACID-NACL 1000-0.7 MG/100ML-% IV SOLN: 1000.0000 mg | INTRAVENOUS | Status: DC

## 2022-10-30 MED ORDER — ATORVASTATIN CALCIUM 40 MG PO TABS
40.0000 mg | ORAL_TABLET | Freq: Every day | ORAL | Status: DC
  Administered 2022-11-01 – 2022-11-08 (×8): 40 mg via ORAL
  Filled 2022-10-30 (×10): qty 1

## 2022-10-30 MED ORDER — SODIUM CHLORIDE 0.45 % IV SOLN: INTRAVENOUS | Status: AC

## 2022-10-30 MED ORDER — POVIDONE-IODINE 10 % EX SWAB: 2.0000 | Freq: Once | CUTANEOUS | Status: DC

## 2022-10-30 MED ORDER — ASPIRIN 81 MG PO TBEC
81.0000 mg | DELAYED_RELEASE_TABLET | Freq: Every day | ORAL | Status: DC
  Filled 2022-10-30 (×2): qty 1

## 2022-10-30 MED ORDER — CEFAZOLIN SODIUM-DEXTROSE 2-4 GM/100ML-% IV SOLN
2.0000 g | INTRAVENOUS | Status: AC
  Administered 2022-10-31: 2 g via INTRAVENOUS
  Filled 2022-10-30: qty 100

## 2022-10-30 MED ORDER — BRIMONIDINE TARTRATE 0.2 % OP SOLN
1.0000 [drp] | Freq: Two times a day (BID) | OPHTHALMIC | Status: DC
  Administered 2022-10-30 – 2022-11-08 (×16): 1 [drp] via OPHTHALMIC
  Filled 2022-10-30 (×2): qty 5

## 2022-10-30 MED ORDER — POTASSIUM CHLORIDE 10 MEQ/100ML IV SOLN
10.0000 meq | INTRAVENOUS | Status: AC
  Administered 2022-10-30 (×3): 10 meq via INTRAVENOUS
  Filled 2022-10-30 (×4): qty 100

## 2022-10-30 MED ORDER — LEVOTHYROXINE SODIUM 50 MCG PO TABS
50.0000 ug | ORAL_TABLET | Freq: Every day | ORAL | Status: DC
  Administered 2022-11-01 – 2022-11-08 (×7): 50 ug via ORAL
  Filled 2022-10-30 (×8): qty 1

## 2022-10-30 NOTE — Consult Note (Signed)
Cardiology Consultation   Patient ID: [name] MRN: [id number]; DOB: 1934/06/17  Admit date: 10/30/2022 Date of Consult: 10/30/2022  PCP:  Doreene Nest, NP   [facility] Providers Cardiologist:  [name], MD   Patient Profile:   [name] is a 89 y.o. female with a hx of dementia, CAD and AS s/p CABG and AVR 2009, CKD stage 3a, HFpEF, HTN, HLD, CVA, hypothyroidism, second degree AV block s/p PPM, and asthma who is being seen 10/30/2022 for the evaluation of preoperative risk assessment at the request of Dr. Rito Ehrlich.  History of Present Illness:   [name] has a history of CAD and AS s/p CABG/AVR in 2009 with LIMA-ALD, SVG-LCX, SVG-L-PLV. He has a remote history of syncope due to mobitz type II second degree AV block s/p dual chamber PPM ([employer] 2016). She also has a RBBB and LAFB.  Last echo 2020 with normal LVEF and good aortic valve function. She was last seen by Dr. Royann Shivers 09/2021 and was doing well in assisted living, but having significant memory issues.  She was hospitalized overnight 10/21/22 for AMS, altered speech, and was dragging her left leg. She has a history of right hip replacement. She was diagnosed with COVID-19. No MRI brain given PPM in place. Neurology consulted and felt presentation was less likely due to CVA. Symptoms felt related to COVID. She was discharged on ASA only, plavix discontinued.   She presented back to the ER 10/29/22 with left hip pain after falling twice in the last 24 hrs.  History of dementia. She has recurrent aphasia and left-sided weakness. She was found to have a left femoral neck fracture. Ortho was consulted.   Cardiology was curbsided due to abnormal EKG with concern for Afib. However, cardiology noted multiple p waves and did not think this represented Afib. Pt was transferred to [facility] for further care. Cardiology formally consulted for preoperative evaluation.   Pt was not very communicative.  I  discussed all issues with her daughter who was present in the room    Past Medical History:  Diagnosis Date   AAA (abdominal aortic aneurysm) (HCC) 01/03/13   3.5x3.3cm   Alzheimer's dementia (HCC)    "probably middle stage" (11/05/2014)   Anemia    "hx of chronic" (11/05/2014)   Aortic stenosis 03/29/2008   biological prosthetic replacement   Arthritis    "joints" (11/05/2014)   Asthma    Avascular necrosis of bone of hip, right (HCC) 07/28/2018   Chronic asthmatic bronchitis    "q fall and winter" (11/05/2014)   Closed nondisplaced fracture of middle phalanx of lesser toe of right foot 01/05/2021   Coronary artery disease    CABG 03/29/08   Dyslipidemia    Exertional shortness of breath    Family history of adverse reaction to anesthesia    "daughter gets PONV too"   Heart murmur    Hypertension    Hypothyroidism    LBBB (left bundle branch block)    Migraines    "none in years' (11/05/2014)   Mobitz type 2 second degree AV block 10/31/2014   MVP (mitral valve prolapse)    with mild mitral insufficiency/notes 08/17/2013   Near syncope 08/17/2013   PONV (postoperative nausea and vomiting)    Presence of permanent cardiac pacemaker    Sinus pause 10/31/2014   Stroke (HCC) 03/2008   "2 light strokes"; denies residual on 11/05/2014   Urinary frequency     Past Surgical History:  Procedure Laterality Date   ABDOMINAL HYSTERECTOMY  1977   AORTIC VALVE REPLACEMENT  03/29/2008   PERICARDIAL TISSUE VALVE   APPENDECTOMY  ?1977   CARDIAC CATHETERIZATION  ~ 2009   CATARACT EXTRACTION W/ INTRAOCULAR LENS IMPLANT Left    CORONARY ARTERY BYPASS GRAFT  03/29/2008   LIMA TO LAD,SVG TO CX,SVG TO LEFT POSTEROLATERAL BRANCH   HIP FRACTURE SURGERY Right 06/2010   "3 metal screws"    INSERT / REPLACE / REMOVE PACEMAKER  11/05/2014   LOOP RECORDER EXPLANT N/A 11/05/2014   Procedure: LOOP RECORDER EXPLANT;  Surgeon: Sanda Klein, MD;  Location: [facility];  Service: Cardiovascular;  Laterality: N/A;    LOOP RECORDER IMPLANT N/A 09/17/2013   Procedure: LOOP RECORDER IMPLANT;  Surgeon: Sanda Klein, MD;  Location: [facility];  Service: Cardiovascular;  Laterality: N/A;   PERMANENT PACEMAKER INSERTION N/A 11/05/2014   Procedure: PERMANENT PACEMAKER INSERTION;  Surgeon: Sanda Klein, MD;  Location: [facility];  Service: Cardiovascular;  Laterality: N/A;   REFRACTIVE SURGERY Bilateral    /notes 04/28/2008 (08/17/2013)   RETINAL DETACHMENT SURGERY Left 1994   Archie Endo 04/10/2008 (08/17/2013)   TIBIAL TUBERCLERPLASTY  11/05/2014   tooth pulled   06/2018   TOTAL HIP ARTHROPLASTY Right 07/31/2018   Procedure: RIGHT TOTAL HIP ARTHROPLASTY ANTERIOR APPROACH;  Surgeon: Frederik Pear, MD;  Location: [facility];  Service: Orthopedics;  Laterality: Right;   UTERINE FIBROID SURGERY  1960's     Home Medications:  Prior to Admission medications   Medication Sig Start Date End Date Taking? Authorizing Provider  acetaminophen (TYLENOL) 500 MG tablet Take 1,000 mg by mouth every 8 (eight) hours as needed for mild pain or moderate pain.    [provider]  alendronate (FOSAMAX) 70 MG tablet Take 1 tablet (70 mg total) by mouth once a week. No food or other medications for 30 min. Avoid laying flat for two hours. Patient taking differently: Take 70 mg by mouth every Sunday. No food or other medications for 30 min. Avoid laying flat for two hours. 04/19/22   Pleas Koch, NP  amoxicillin (AMOXIL) 500 MG tablet Take 2,000 mg by mouth See admin instructions. Take 2,000mg  by mouth 1 hour prior to dental appointments 12/10/15   [provider]  aspirin EC (ASPIRIN LOW DOSE) 81 MG tablet Take 1 tablet (81 mg total) by mouth daily. 09/19/22   Pleas Koch, NP  atorvastatin (LIPITOR) 40 MG tablet TAKE 1 TABLET BY MOUTH DAILY FOR CHOLESTEROL 10/26/22   Pleas Koch, NP  brimonidine (ALPHAGAN) 0.2 % ophthalmic solution Place 1 drop into the left eye 2 (two) times daily. 07/10/18   [provider]  Calcium Carbonate-Vit D-Min (CALTRATE 600+D PLUS MINERALS) 600-800 MG-UNIT TABS Take 1 tablet by mouth twice daily for calcium and vitamin D. Patient taking differently: Take 1 tablet by mouth daily with supper. 09/30/22   Pleas Koch, NP  fluticasone [facility]) 50 MCG/ACT nasal spray PLACE 1 SPRAY INTO BOTH NOSTRILS DAILY 09/19/22   Pleas Koch, NP  fluticasone-salmeterol (ADVAIR HFA) [phone] MCG/ACT inhaler Inhale 1 puff into the lungs 2 (two) times daily.    [provider]  furosemide (LASIX) 40 MG tablet TAKE 1 TABLET BY MOUTH DAILY 10/13/22   Croitoru, Mihai, MD  guaiFENesin-dextromethorphan (ROBITUSSIN DM) 100-10 MG/5ML syrup Take 10 mLs by mouth every 4 (four) hours as needed for cough. 10/22/22   Caren Griffins, MD  levothyroxine (SYNTHROID) 50 MCG tablet TAKE 1 TABLET  BY MOUTH EVERY MORNING ON AN EMPTY STOMACH WITH WATER ONLY. NO FOOD OR OTHER MEDICATIONS FOR 30 MINS. Patient taking differently: Take 50 mcg by mouth daily before breakfast. TAKE 1 TABLET BY MOUTH EVERY MORNING ON AN EMPTY STOMACH WITH WATER ONLY. NO FOOD OR OTHER MEDICATIONS FOR 30 MINS. 09/05/22   Pleas Koch, NP  losartan (COZAAR) 100 MG tablet TAKE 1 TABLET BY MOUTH DAILY FOR BLOOD PRESSURE 10/14/22   Pleas Koch, NP  potassium chloride (KLOR-CON M) 10 MEQ tablet TAKE 2 TABLETS BY MOUTH 2 TIMES DAILY 10/26/22   Croitoru, Mihai, MD    Inpatient Medications: Scheduled Meds:  aspirin EC  81 mg Oral Daily   atorvastatin  40 mg Oral Daily   brimonidine  1 drop Left Eye BID   levothyroxine  50 mcg Oral Q0600   Continuous Infusions:  PRN Meds: acetaminophen **OR** acetaminophen, HYDROmorphone (DILAUDID) injection, ondansetron (ZOFRAN) IV  Allergies:    Allergies  Allergen Reactions   Codeine Nausea And Vomiting and Other (See Comments)    Patient gets violently ill   Morphine And Related Nausea And Vomiting and Other (See Comments)    Patient get violently ill     Social History:   Social History   Socioeconomic History   Marital status: Divorced    Spouse name: Not on file   Number of children: 1   Years of education: Some college   Highest education level: Not on file  Occupational History   Occupation: Retired  Tobacco Use   Smoking status: Never   Smokeless tobacco: Never  Scientific laboratory technician Use: Never used  Substance and Sexual Activity   Alcohol use: No    Alcohol/week: 0.0 standard drinks of alcohol   Drug use: No   Sexual activity: Not Currently  Other Topics Concern   Not on file  Social History Narrative   Pt lives in single story home with her daughter and son-in-law   Has 1 child   Some college education   Retired Clinical cytogeneticist for the city of [place] Strain: [place]  (06/26/2019)   Overall Financial Resource Strain (CARDIA)    Difficulty of Paying Living Expenses: Not hard at [place]: No [place] (10/30/2022)   Hunger Vital Sign    Worried About Running Out of Food in the Last Year: Never true    [place] in the Last Year: Never true  Transportation Needs: No Transportation Needs (10/30/2022)   PRAPARE - Hydrologist (Medical): No    Lack of Transportation (Non-Medical): No  Physical Activity: Inactive (06/26/2019)   Exercise Vital Sign    Days of Exercise per Week: 0 days    Minutes of Exercise per Session: 0 min  Stress: No Stress Concern Present (06/26/2019)   [place]    Feeling of Stress : Not at all  Social Connections: Not on file  Intimate Partner Violence: Not At Risk (10/30/2022)   Humiliation, Afraid, Rape, and Kick questionnaire    Fear of Current or Ex-Partner: No    Emotionally Abused: No    Physically Abused: No    Sexually Abused: No    Family History:    Family History  Problem Relation Age of  Onset   Heart failure Mother    Diabetes Mother    Hypertension  Mother    Hyperlipidemia Mother    Melanoma Grandchild    Colon cancer Neg Hx      ROS:  Please see the history of present illness.   All other ROS reviewed and negative.     Physical Exam/Data:   Vitals:   10/30/22 0144 10/30/22 0825 10/30/22 1104  BP: 128/66 (!) 150/78 (!) 157/68  Pulse: 74 65 69  Resp: 15 17 17   Temp: 98.6 F (37 C) 98.5 F (36.9 C) 98.4 F (36.9 C)  TempSrc: Axillary Oral Oral  SpO2: 94% 94% 97%   No intake or output data in the 24 hours ending 10/30/22 1206    10/20/2022    5:43 PM 10/20/2022    3:23 PM 08/06/2022    3:03 PM  Last 3 Weights  Weight (lbs) 156 lb 8.4 oz 158 lb 166 lb  Weight (kg) 71 kg 71.668 kg 75.297 kg     There is no height or weight on file to calculate BMI.  General:  Well nourished, well developed, in no acute distress HEENT: normal Neck: no JVD Vascular: No carotid bruits; Distal pulses 2+ bilaterally Cardiac:  normal S1, S2; RRR; [id number] systolic murmur  Lungs:  clear to auscultation bilaterally, no wheezing, rhonchi or rales  Abd: soft, nontender, no hepatomegaly  Ext: no edema Musculoskeletal:  No deformities, BUE and BLE strength normal and equal Skin: warm and dry  Neuro: grossly intact ,  neuro to assess  Psych:  dementia,  difficult to assess.   EKG:  The EKG was personally reviewed and demonstrates:  appears sinus rhythm with HR 67, RBBB, LAFB, PACs Telemetry:  Telemetry was personally reviewed and demonstrates:   NSR , frequent PACs,  occasional PVCs   Relevant CV Studies:  Echo pending  Laboratory Data:  High Sensitivity Troponin:   Recent Labs  Lab 10/20/22 1746 10/20/22 2117  TROPONINIHS 18* 18*     Chemistry Recent Labs  Lab 10/29/22 1436  NA 135  K 2.9*  CL 95*  CO2 25  GLUCOSE 136*  BUN 15  CREATININE 0.99  CALCIUM 9.1  GFRNONAA 56*  ANIONGAP 15    Recent Labs  Lab 10/29/22 1436  PROT 7.6  ALBUMIN 4.3  AST 44*   ALT 46*  ALKPHOS 80  BILITOT 1.4*   Lipids No results for input(s): "CHOL", "TRIG", "HDL", "LABVLDL", "LDLCALC", "CHOLHDL" in the last 168 hours.  Hematology Recent Labs  Lab 10/29/22 1436 10/30/22 1108  WBC 13.2* 7.6  RBC 4.90 4.11  HGB 15.0 12.9  HCT 43.4 36.6  MCV 88.6 89.1  MCH 30.6 31.4  MCHC 34.6 35.2  RDW 12.6 12.9  PLT 240 181   Thyroid No results for input(s): "TSH", "FREET4" in the last 168 hours.  BNPNo results for input(s): "BNP", "PROBNP" in the last 168 hours.  DDimer No results for input(s): "DDIMER" in the last 168 hours.   Radiology/Studies:  DG Hip Unilat W or Wo Pelvis 2-3 Views Left  Result Date: 10/29/2022 CLINICAL DATA:  Left hip pain after fall EXAM: DG HIP (WITH OR WITHOUT PELVIS) 2-3V LEFT COMPARISON:  Left hip x-ray 10/13/2022 FINDINGS: There is an acute left femoral neck fracture with mild superior displacement of the distal fracture fragment. There is no dislocation. The bones are osteopenic. Right hip arthroplasty appears in anatomic alignment. IMPRESSION: Acute left femoral neck fracture. Electronically Signed   By: Ronney Asters M.D.   On: 10/29/2022 15:38   DG Chest Portable 1 View  Result  Date: 10/29/2022 CLINICAL DATA:  Left hip pain after fall EXAM: PORTABLE CHEST 1 VIEW COMPARISON:  10/21/2022 FINDINGS: Single frontal view of the chest demonstrates stable postsurgical changes from median sternotomy and aortic valve replacement. Dual lead pacemaker unchanged. Cardiac silhouette is stable. No airspace disease, effusion, or pneumothorax. No acute displaced fracture. Stable hiatal hernia. IMPRESSION: 1. No acute intrathoracic process. Electronically Signed   By: Randa Ngo M.D.   On: 10/29/2022 15:29   CT HEAD WO CONTRAST (5MM)  Result Date: 10/29/2022 CLINICAL DATA:  Trauma. EXAM: CT HEAD WITHOUT CONTRAST TECHNIQUE: Contiguous axial images were obtained from the base of the skull through the vertex without intravenous contrast. RADIATION DOSE  REDUCTION: This exam was performed according to the departmental dose-optimization program which includes automated exposure control, adjustment of the mA and/or kV according to patient size and/or use of iterative reconstruction technique. COMPARISON:  Head CT 10/20/2022.  MRI brain 04/10/2018. FINDINGS: Brain: No evidence of acute infarction, hemorrhage, hydrocephalus, extra-axial collection or mass lesion/mass effect. There is mild diffuse atrophy. There is stable moderate periventricular white matter hypodensity, likely chronic small vessel ischemic change. Vascular: Atherosclerotic calcifications are present within the cavernous internal carotid arteries. Skull: Normal. Negative for fracture or focal lesion. Sinuses/Orbits: No acute finding. Other: None. IMPRESSION: 1. No acute intracranial process. 2. Stable atrophy and chronic small vessel ischemic changes. Electronically Signed   By: Ronney Asters M.D.   On: 10/29/2022 15:15   CT Cervical Spine Wo Contrast  Result Date: 10/29/2022 CLINICAL DATA:  Neck trauma (Age >= 65y) EXAM: CT CERVICAL SPINE WITHOUT CONTRAST TECHNIQUE: Multidetector CT imaging of the cervical spine was performed without intravenous contrast. Multiplanar CT image reconstructions were also generated. RADIATION DOSE REDUCTION: This exam was performed according to the departmental dose-optimization program which includes automated exposure control, adjustment of the mA and/or kV according to patient size and/or use of iterative reconstruction technique. COMPARISON:  CT cervical spine 11/14/2021. FINDINGS: Alignment: Mild anterolisthesis of C2 on C3 is unchanged. No new sagittal subluxation. Skull base and vertebrae: No evidence of acute fracture. Soft tissues and spinal canal: No prevertebral fluid or swelling. No visible canal hematoma. Disc levels: Similar multilevel degenerative change. Upper chest: Biapical pleuroparenchymal scarring. IMPRESSION: No evidence of acute fracture or  traumatic malalignment. Electronically Signed   By: Margaretha Sheffield M.D.   On: 10/29/2022 15:15     Assessment and Plan:   CAD s/p CABG x 4 2009 Continue ASA Patient denies chest pain, but in the setting of dementia No recent stress testing available Reassuring echo in 2020   AS s/p AVR 2009 Last echo 2020 Will repeat an echo to help with risk stratification and volume management during surgery   Hx of stroke x 2 Presented  ASA, neurology following   Abnormal EKG Hx of atrial tachycardia on interrogations Will obtain device interrogation EKG this admission appears to be sinus with PACs     Preoperative risk evaluation for MACE RCRI 6.6% given CAD and CVA. She is at least moderate risk for MACE during surgery. She does not have any unstable cardiac conditions, pending PPM interrogation. I do not think additional testing outside of the pending echo will lessen her risk for cardiac complications. If PPM interrogation unrevealing and echo with stable BiV and aortic valve function, may proceed with surgery.      Risk Assessment/Risk Scores:      For questions or updates, please contact [facility] Please consult [email or web address] for contact info under  Signed, Ledora Bottcher, [place]  10/30/2022 12:06 PM  Attending Note:   The patient was seen and examined.  Agree with assessment and plan as noted above.  Changes made to the above note as needed.  Patient seen and independently examined with Doreene Adas, PA .   We discussed all aspects of the encounter. I agree with the assessment and plan as stated above.    Possible Atrial fib Pacer interrogation over the past 13 months does not reveal any evidence of atrial fib.   She does have episodes of atrial tachycardia, PAC, PVCs.  No cardiac indication to initiate anticoagulation at this point  She is severely demented but  I do not think these episode are symptomatic .    We could consider low dose metoprolol to  help suppress these PACs and atrial tachycardia    2.  Pre op for hip repair :  will get an echo to assess her current cardiac condition.   We cannot get any history from her to know her exercise capacity   I have discussed these issues with her daughter .   Daughter has a good understanding of what we have discussed.      I have spent a total of 40 minutes with patient reviewing hospital  notes , telemetry, EKGs, labs and examining patient as well as establishing an assessment and plan that was discussed with the patient.  > 50% of time was spent in direct patient care.    Thayer Headings, Brooke Bonito., MD, [facility] 10/30/2022, 3:31 PM [address],  [place] Pager [phone]

## 2022-10-30 NOTE — Consult Note (Signed)
Reason for Consult:left hip pain Referring Physician: medicine  [name] is an 89 y.o. female.  HPI: [name] is a 89 y.o. female with medical history significant of Alzheimer's dementia, hypertension, hyperlipidemia, asthma, CAD status post CABG, chronic HFpEF, stroke, hypothyroidism, CKD stage IIIa, LBBB, history of aortic valve replacement, mitral valve prolapse/mitral regurgitation, paroxysmal atrial tachycardia/A-fib not on anticoagulation per cardiology due to extremely low burden of arrhythmia, has a pacemaker, AAA, PAD. Patient presented to [facility] ED yesterday for evaluation of left hip pain, recurrent left-sided weakness, and expressive aphasia. She had 2 falls in the last 24 hours.  X-ray of left hip/pelvis showing acute left femoral neck fracture. Patient has dementia and is not able to give any history.  History provided by her daughter and granddaughter at bedside.Yesterday family noticed that the patient was having difficulty with her speech/word finding difficulty and the left side of her face was drooping.  She has been increasingly more confused.  They were told that patient would need an MRI to assess for possible stroke.  Past Medical History:  Diagnosis Date   AAA (abdominal aortic aneurysm) (HCC) 01/03/13   3.5x3.3cm   Alzheimer's dementia (HCC)    "probably middle stage" (11/05/2014)   Anemia    "hx of chronic" (11/05/2014)   Aortic stenosis 03/29/2008   biological prosthetic replacement   Arthritis    "joints" (11/05/2014)   Asthma    Avascular necrosis of bone of hip, right (HCC) 07/28/2018   Chronic asthmatic bronchitis    "q fall and winter" (11/05/2014)   Closed nondisplaced fracture of middle phalanx of lesser toe of right foot 01/05/2021   Coronary artery disease    CABG 03/29/08   Dyslipidemia    Exertional shortness of breath    Family history of adverse reaction to anesthesia    "daughter gets PONV too"   Heart murmur    Hypertension    Hypothyroidism     LBBB (left bundle branch block)    Migraines    "none in years' (11/05/2014)   Mobitz type 2 second degree AV block 10/31/2014   MVP (mitral valve prolapse)    with mild mitral insufficiency/notes 08/17/2013   Near syncope 08/17/2013   PONV (postoperative nausea and vomiting)    Presence of permanent cardiac pacemaker    Sinus pause 10/31/2014   Stroke (HCC) 03/2008   "2 light strokes"; denies residual on 11/05/2014   Urinary frequency     Past Surgical History:  Procedure Laterality Date   ABDOMINAL HYSTERECTOMY  1977   AORTIC VALVE REPLACEMENT  03/29/2008   PERICARDIAL TISSUE VALVE   APPENDECTOMY  ?1977   CARDIAC CATHETERIZATION  ~ 2009   CATARACT EXTRACTION W/ INTRAOCULAR LENS IMPLANT Left    CORONARY ARTERY BYPASS GRAFT  03/29/2008   LIMA TO LAD,SVG TO CX,SVG TO LEFT POSTEROLATERAL BRANCH   HIP FRACTURE SURGERY Right 06/2010   "3 metal screws"    INSERT / REPLACE / REMOVE PACEMAKER  11/05/2014   LOOP RECORDER EXPLANT N/A 11/05/2014   Procedure: LOOP RECORDER EXPLANT;  Surgeon: [name], MD;  Location: [facility]ATH [facility];  Service: Cardiovascular;  Laterality: N/A;   LOOP RECORDER IMPLANT N/A 09/17/2013   Procedure: LOOP RECORDER IMPLANT;  Surgeon: [name], MD;  Location: [facility]ATH [facility];  Service: Cardiovascular;  Laterality: N/A;   PERMANENT PACEMAKER INSERTION N/A 11/05/2014   Procedure: PERMANENT PACEMAKER INSERTION;  Surgeon: [name], MD;  Location: [facility]ATH [facility];  Service: Cardiovascular;  Laterality: N/A;  REFRACTIVE SURGERY Bilateral    /notes 04/28/2008 (08/17/2013)   RETINAL DETACHMENT SURGERY Left 1994   [name] 04/10/2008 (08/17/2013)   TIBIAL TUBERCLERPLASTY  11/05/2014   tooth pulled   06/2018   TOTAL HIP ARTHROPLASTY Right 07/31/2018   Procedure: RIGHT TOTAL HIP ARTHROPLASTY ANTERIOR APPROACH;  Surgeon: Gean Birchwood, MD;  Location: [facility] OR[facility];  Service: Orthopedics;  Laterality: Right;   UTERINE FIBROID SURGERY  1960's    Family History  Problem Relation Age of  Onset   Heart failure Mother    Diabetes Mother    Hypertension Mother    Hyperlipidemia Mother    Melanoma Grandchild    Colon cancer Neg Hx     Social History:  reports that she has never smoked. She has never used smokeless tobacco. She reports that she does not drink alcohol and does not use drugs.  Allergies:  Allergies  Allergen Reactions   Codeine Nausea And Vomiting and Other (See Comments)    Patient gets violently ill   Morphine And Related Nausea And Vomiting and Other (See Comments)    Patient get violently ill    Medications: I have reviewed the patient's current medications.  Results for orders placed or performed during the hospital encounter of 10/29/22 (from the past 48 hour(s))  CBC with Differential     Status: Abnormal   Collection Time: 10/29/22  2:36 PM  Result Value Ref Range   WBC 13.2 (H) 4.0 - 10.5 K/uL   RBC 4.90 3.87 - 5.11 MIL/uL   Hemoglobin 15.0 12.0 - 15.0 g/dL   HCT 82.9 56.2 - 13.0 %   MCV 88.6 80.0 - 100.0 fL   MCH 30.6 26.0 - 34.0 pg   MCHC 34.6 30.0 - 36.0 g/dL   RDW 86.5 78.4 - 69.6 %   Platelets 240 150 - 400 K/uL   nRBC 0.0 0.0 - 0.2 %   Neutrophils Relative % 89 %   Neutro Abs 11.8 (H) 1.7 - 7.7 K/uL   Lymphocytes Relative 4 %   Lymphs Abs 0.5 (L) 0.7 - 4.0 K/uL   Monocytes Relative 6 %   Monocytes Absolute 0.8 0.1 - 1.0 K/uL   Eosinophils Relative 0 %   Eosinophils Absolute 0.0 0.0 - 0.5 K/uL   Basophils Relative 0 %   Basophils Absolute 0.0 0.0 - 0.1 K/uL   Immature Granulocytes 1 %   Abs Immature Granulocytes 0.10 (H) 0.00 - 0.07 K/uL    Comment: Performed at [facility], [address] [place], [place] [address]  Comprehensive metabolic panel     Status: Abnormal   Collection Time: 10/29/22  2:36 PM  Result Value Ref Range   Sodium 135 135 - 145 mmol/L   Potassium 2.9 (L) 3.5 - 5.1 mmol/L   Chloride 95 (L) 98 - 111 mmol/L   CO2 25 22 - 32 mmol/L   Glucose, Bld 136 (H) 70 - 99 mg/dL    Comment: Glucose  reference range applies only to samples taken after fasting for at least 8 hours.   BUN 15 8 - 23 mg/dL   Creatinine, Ser 4.13 0.44 - 1.00 mg/dL   Calcium 9.1 8.9 - 24.4 mg/dL   Total Protein 7.6 6.5 - 8.1 g/dL   Albumin 4.3 3.5 - 5.0 g/dL   AST 44 (H) 15 - 41 U/L   ALT 46 (H) 0 - 44 U/L   Alkaline Phosphatase 80 38 - 126 U/L   Total Bilirubin 1.4 (H) 0.3 - 1.2  mg/dL   GFR, Estimated 56 (L) >60 mL/min    Comment: (NOTE) Calculated using the CKD-EPI Creatinine Equation (2021)    Anion gap 15 5 - 15    Comment: Performed at [facility], [address] [place], [place] [address]  Protime-INR     Status: None   Collection Time: 10/29/22  2:36 PM  Result Value Ref Range   Prothrombin Time 13.1 11.4 - 15.2 seconds   INR 1.0 0.8 - 1.2    Comment: (NOTE) INR goal varies based on device and disease states. Performed at [facility], [address] [place], [place] [address]   Lipase, blood     Status: None   Collection Time: 10/29/22  2:36 PM  Result Value Ref Range   Lipase 30 11 - 51 U/L    Comment: Performed at [facility], [address] [place], [place] [address]  Urinalysis, Routine w reflex microscopic Urine, Clean Catch     Status: Abnormal   Collection Time: 10/29/22  2:36 PM  Result Value Ref Range   Color, Urine YELLOW (A) YELLOW   APPearance CLEAR (A) CLEAR   Specific Gravity, Urine 1.009 1.005 - 1.030   pH 6.0 5.0 - 8.0   Glucose, UA NEGATIVE NEGATIVE mg/dL   Hgb urine dipstick NEGATIVE NEGATIVE   Bilirubin Urine NEGATIVE NEGATIVE   Ketones, ur NEGATIVE NEGATIVE mg/dL   Protein, ur NEGATIVE NEGATIVE mg/dL   Nitrite NEGATIVE NEGATIVE   Leukocytes,Ua NEGATIVE NEGATIVE    Comment: Performed at [facility], [address], [place], [place] [address]    DG Hip Unilat W or Wo Pelvis 2-3 Views Left  Result Date: 10/29/2022 CLINICAL DATA:  Left hip pain after fall EXAM: DG HIP (WITH OR WITHOUT PELVIS) 2-3V LEFT COMPARISON:   Left hip x-ray 10/13/2022 FINDINGS: There is an acute left femoral neck fracture with mild superior displacement of the distal fracture fragment. There is no dislocation. The bones are osteopenic. Right hip arthroplasty appears in anatomic alignment. IMPRESSION: Acute left femoral neck fracture. Electronically Signed   By: Darliss Cheney M.D.   On: 10/29/2022 15:38   DG Chest Portable 1 View  Result Date: 10/29/2022 CLINICAL DATA:  Left hip pain after fall EXAM: PORTABLE CHEST 1 VIEW COMPARISON:  10/21/2022 FINDINGS: Single frontal view of the chest demonstrates stable postsurgical changes from median sternotomy and aortic valve replacement. Dual lead pacemaker unchanged. Cardiac silhouette is stable. No airspace disease, effusion, or pneumothorax. No acute displaced fracture. Stable hiatal hernia. IMPRESSION: 1. No acute intrathoracic process. Electronically Signed   By: Sharlet Salina M.D.   On: 10/29/2022 15:29   CT HEAD WO CONTRAST ( )  Result Date: 10/29/2022 CLINICAL DATA:  Trauma. EXAM: CT HEAD WITHOUT CONTRAST TECHNIQUE: Contiguous axial images were obtained from the base of the skull through the vertex without intravenous contrast. RADIATION DOSE REDUCTION: This exam was performed according to the departmental dose-optimization program which includes automated exposure control, adjustment of the mA and/or kV according to patient size and/or use of iterative reconstruction technique. COMPARISON:  Head CT 10/20/2022.  MRI brain 04/10/2018. FINDINGS: Brain: No evidence of acute infarction, hemorrhage, hydrocephalus, extra-axial collection or mass lesion/mass effect. There is mild diffuse atrophy. There is stable moderate periventricular white matter hypodensity, likely chronic small vessel ischemic change. Vascular: Atherosclerotic calcifications are present within the cavernous internal carotid arteries. Skull: Normal. Negative for fracture or focal lesion. Sinuses/Orbits: No acute finding. Other:  None. IMPRESSION: 1. No acute intracranial process.  2. Stable atrophy and chronic small vessel ischemic changes. Electronically Signed   By: Ronney Asters M.D.   On: 10/29/2022 15:15   CT Cervical Spine Wo Contrast  Result Date: 10/29/2022 CLINICAL DATA:  Neck trauma (Age >= 65y) EXAM: CT CERVICAL SPINE WITHOUT CONTRAST TECHNIQUE: Multidetector CT imaging of the cervical spine was performed without intravenous contrast. Multiplanar CT image reconstructions were also generated. RADIATION DOSE REDUCTION: This exam was performed according to the departmental dose-optimization program which includes automated exposure control, adjustment of the mA and/or kV according to patient size and/or use of iterative reconstruction technique. COMPARISON:  CT cervical spine 11/14/2021. FINDINGS: Alignment: Mild anterolisthesis of C2 on C3 is unchanged. No new sagittal subluxation. Skull base and vertebrae: No evidence of acute fracture. Soft tissues and spinal canal: No prevertebral fluid or swelling. No visible canal hematoma. Disc levels: Similar multilevel degenerative change. Upper chest: Biapical pleuroparenchymal scarring. IMPRESSION: No evidence of acute fracture or traumatic malalignment. Electronically Signed   By: Margaretha Sheffield M.D.   On: 10/29/2022 15:15    Review of Systems  Constitutional:  Negative for chills and fever.  Respiratory:  Negative for shortness of breath.   Cardiovascular:  Negative for chest pain.  Musculoskeletal:  Positive for arthralgias.  Psychiatric/Behavioral:  Positive for confusion.    Blood pressure (!) 150/78, pulse 65, temperature 98.5 F (36.9 C), temperature source Oral, resp. rate 17, SpO2 94 %. Physical Exam Constitutional:      General: She is not in acute distress.    Appearance: Normal appearance. She is not ill-appearing.  HENT:     Head: Normocephalic and atraumatic.  Eyes:     Extraocular Movements: Extraocular movements intact.     Pupils: Pupils are equal,  round, and reactive to light.  Cardiovascular:     Rate and Rhythm: Tachycardia present. Rhythm irregular.     Pulses: Normal pulses.  Pulmonary:     Effort: Pulmonary effort is normal.  Musculoskeletal:     Comments: Left lower extremity shortened and externally rotated. She has tenderness to palpation about the left lateral hip. No hematoma, open wound, or abrasion. Distally neurovascularly intact. Calves soft and nontender.   Neurological:     Mental Status: She is alert. Mental status is at baseline.     Assessment/Plan: Acute left femoral neck fracture   Patient will need either a left hip hemiarthroplasty or a left total hip arthroplasty. Ortho will standby until medicine completes evaluation for stroke. Likely will not go to OR today due to continued medical workup needed. Medicine may change NPO status and add desired diet based on her needs. Patient to remain on bed rest and NWB LLE. Dr Tamera Punt will make arrangements for appropriate surgeon and will post accordingly.   Kodah Maret L. [place], PA-C 10/30/2022, 8:33 AM

## 2022-10-30 NOTE — Progress Notes (Signed)
In regards to pt's currently ordered MRI, pt has an MRI Unsafe Pacemaker device implanted and pt is therefore not a candidate for MRI. Ordered will need to be cancelled.

## 2022-10-30 NOTE — ED Notes (Signed)
Transfer of facility report called to [name] Schwalbe at [phone]. Transfer of care report given to EMS over phone and then at bedside.

## 2022-10-30 NOTE — Progress Notes (Signed)
[facility] PROGRESS NOTE   [name] KWI:[id number] DOB: 1933/11/09 DOA: 10/30/2022  PCP: Doreene Nest, NP  Brief History/Interval Summary: 89 y.o. female with medical history significant for Alzheimer's dementia, hypertension, hyperlipidemia, asthma, CAD status post CABG, chronic HFpEF, stroke, hypothyroidism, CKD stage IIIa, LBBB, history of aortic valve replacement, mitral valve prolapse/mitral regurgitation, paroxysmal atrial tachycardia/A-fib not on anticoagulation per cardiology due to extremely low burden of arrhythmia, has a pacemaker, AAA, PAD.  Recently admitted 12/28-12/29 for acute metabolic encephalopathy secondary to COVID-19 infection.  Patient was admitted due to left lower extremity weakness and concern for CVA but neurology felt this was less likely.  CT head negative for acute finding.  Brain MRI could not be done due to presence of a pacemaker.  Chest x-ray was showing an infiltrate and she was treated with Paxlovid.  She was not hypoxic. Patient presented to [facility] ED again for evaluation of left hip pain, recurrent left-sided weakness, and expressive aphasia.  Found to have a left hip fracture. ED physician spoke to Dr. Selina Cooley with neurology who recommended brain MRI for further evaluation could not be done at [place] patient has a pacemaker.  Case was discussed with radiology and ED physician was told that MRI could be done at [facility].  ED physician consulted Dr. Ave Filter with orthopedics who agrees to see the patient in consultation at [facility].  Patient was transferred to [facility] for further evaluation.   Consultants: Cardiology.  Orthopedics.  Neurology  Procedures: None yet    Subjective/Interval History: Patient with dementia.  Unable to answer questions appropriately.  Her daughter and granddaughter at the bedside.  Patient does complain of pain in the left hip area.  Does not appear to be in significant  discomfort.    Assessment/Plan:  Recurrent strokelike symptoms Patient presented with left-sided weakness facial droop and expressive aphasia CT head was negative for acute findings.  Patient had recent hospitalization for similar complaints.  She was admitted to [facility].  She was seen by neurology at that time.  They felt that her presentation was most likely secondary to exacerbation of pre-existing deficits due to COVID infection.  Stroke.  To be less likely. Due to recurrent symptoms patient was thought to require MRI.  She has a pacemaker.  She was transferred to [facility].  Informed by radiology team that the pacemaker is unsafe for MRI. Will request neurology to see her here in the hospital to determine further management. Patient noted to be on aspirin and statin. LDL was 76 on December 29.  HbA1c 6.0 on December 29. No recent echocardiogram in our system.  Last 1 was from 2020 which showed normal systolic function.  No significant valvular abnormalities were noted.  Left hip fracture Orthopedics has been consulted.  Cardiology to see for preoperative evaluation due to her significant cardiac history.  Pain control.  Unspecified arrhythmia As per previous note patient has had atrial fibrillation previously but the burden has been low.  Not on anticoagulation.  Will request cardiology to evaluate.  Pacemaker in situ May need to be interrogated.  Cardiology to assist.  Coronary artery disease status post CABG Stable from a cardiac standpoint otherwise.  Continue aspirin and statin.  Hypokalemia Was repleted.  Labs are pending from this morning.  Recent COVID-19 infection Looks like she completed course of Paxlovid.  No respiratory symptoms currently.  She was tested on 12/28.  Day 1 will be 12/29.  Day 10 will be 1/7.  Can come off of isolation on 1/8.  Chronic's diastolic CHF Stable.  Euvolemic.  History of Alzheimer's dementia Stable.  Essential hypertension Stable.   Monitor blood pressures closely.  Hypothyroidism Levothyroxine can be resumed.  DVT Prophylaxis: SCDs for now Code Status: Full code Family Communication: Discussed with the patient's daughter and granddaughter Disposition Plan: To be determined  Status is: Inpatient Remains inpatient appropriate because: Left hip fracture    Medications: Scheduled:  aspirin EC  81 mg Oral Daily   atorvastatin  40 mg Oral Daily   brimonidine  1 drop Left Eye BID   levothyroxine  50 mcg Oral Q0600   Continuous: [id number] **OR** acetaminophen, HYDROmorphone (DILAUDID) injection, ondansetron (ZOFRAN) IV  Antibiotics: Anti-infectives (From admission, onward)    None       Objective:  Vital Signs  Vitals:   10/30/22 0144 10/30/22 0825  BP: 128/66 (!) 150/78  Pulse: 74 65  Resp: 15 17  Temp: 98.6 F (37 C) 98.5 F (36.9 C)  TempSrc: Axillary Oral  SpO2: 94% 94%   No intake or output data in the 24 hours ending 10/30/22 1041 There were no vitals filed for this visit.  General appearance: Awake alert.  In no distress.  Distracted. Resp: Clear to auscultation bilaterally.  Normal effort Cardio: S1-S2 is normal regular.  No S3-S4.  No rubs murmurs or bruit GI: Abdomen is soft.  Nontender nondistended.  Bowel sounds are present normal.  No masses organomegaly Extremities: No edema.  Left lower extremity is externally rotated.   Lab Results:  Data Reviewed: I have personally reviewed following labs and reports of the imaging studies  CBC: Recent Labs  Lab 10/29/22 1436  WBC 13.2*  NEUTROABS 11.8*  HGB 15.0  HCT 43.4  MCV 88.6  PLT 240    Basic Metabolic Panel: Recent Labs  Lab 10/29/22 1436  NA 135  K 2.9*  CL 95*  CO2 25  GLUCOSE 136*  BUN 15  CREATININE 0.99  CALCIUM 9.1    GFR: Estimated Creatinine Clearance: 42.6 mL/min (by C-G formula based on SCr of 0.99 mg/dL).  Liver Function Tests: Recent Labs  Lab 10/29/22 1436  AST 44*  ALT 46*   ALKPHOS 80  BILITOT 1.4*  PROT 7.6  ALBUMIN 4.3    Recent Labs  Lab 10/29/22 1436  LIPASE 30    Coagulation Profile: Recent Labs  Lab 10/29/22 1436  INR 1.0     Recent Results (from the past 240 hour(s))  Resp panel by RT-PCR (RSV, Flu A&B, Covid) Anterior Nasal Swab     Status: Abnormal   Collection Time: 10/21/22  3:46 AM   Specimen: Anterior Nasal Swab  Result Value Ref Range Status   SARS Coronavirus 2 by RT PCR POSITIVE (A) NEGATIVE Final    Comment: (NOTE) SARS-CoV-2 target nucleic acids are DETECTED.  The SARS-CoV-2 RNA is generally detectable in upper respiratory specimens during the acute phase of infection. Positive results are indicative of the presence of the identified virus, but do not rule out bacterial infection or co-infection with other pathogens not detected by the test. Clinical correlation with patient history and other diagnostic information is necessary to determine patient infection status. The expected result is Negative.  Fact Sheet for Patients: BloggerCourse.com  Fact Sheet for Healthcare Providers: SeriousBroker.it  This test is not yet approved or cleared by the [place] FDA and  has been authorized for detection and/or diagnosis of SARS-CoV-2 by FDA under an Emergency Use Authorization (EUA).  This EUA will remain in effect (meaning this test can be used) for the duration of  the COVID-19 declaration under Section 564(b)(1) of the A ct, 21 U.S.C. section 360bbb-3(b)(1), unless the authorization is terminated or revoked sooner.     Influenza A by PCR NEGATIVE NEGATIVE Final   Influenza B by PCR NEGATIVE NEGATIVE Final    Comment: (NOTE) The Xpert Xpress SARS-CoV-2/FLU/RSV plus assay is intended as an aid in the diagnosis of influenza from Nasopharyngeal swab specimens and should not be used as a sole basis for treatment. Nasal washings and aspirates are unacceptable for  Xpert Xpress SARS-CoV-2/FLU/RSV testing.  Fact Sheet for Patients: BloggerCourse.com  Fact Sheet for Healthcare Providers: SeriousBroker.it  This test is not yet approved or cleared by the [place] FDA and has been authorized for detection and/or diagnosis of SARS-CoV-2 by FDA under an Emergency Use Authorization (EUA). This EUA will remain in effect (meaning this test can be used) for the duration of the COVID-19 declaration under Section 564(b)(1) of the Act, 21 U.S.C. section 360bbb-3(b)(1), unless the authorization is terminated or revoked.     Resp Syncytial Virus by PCR NEGATIVE NEGATIVE Final    Comment: (NOTE) Fact Sheet for Patients: BloggerCourse.com  Fact Sheet for Healthcare Providers: SeriousBroker.it  This test is not yet approved or cleared by the [place] FDA and has been authorized for detection and/or diagnosis of SARS-CoV-2 by FDA under an Emergency Use Authorization (EUA). This EUA will remain in effect (meaning this test can be used) for the duration of the COVID-19 declaration under Section 564(b)(1) of the Act, 21 U.S.C. section 360bbb-3(b)(1), unless the authorization is terminated or revoked.  Performed at [facility], [address], [place], [place] [address]       Radiology Studies: DG Hip Lucienne Capers or Wo Pelvis 2-3 Views Left  Result Date: 10/29/2022 CLINICAL DATA:  Left hip pain after fall EXAM: DG HIP (WITH OR WITHOUT PELVIS) 2-3V LEFT COMPARISON:  Left hip x-ray 10/13/2022 FINDINGS: There is an acute left femoral neck fracture with mild superior displacement of the distal fracture fragment. There is no dislocation. The bones are osteopenic. Right hip arthroplasty appears in anatomic alignment. IMPRESSION: Acute left femoral neck fracture. Electronically Signed   By: Darliss Cheney M.D.   On: 10/29/2022 15:38   DG Chest  Portable 1 View  Result Date: 10/29/2022 CLINICAL DATA:  Left hip pain after fall EXAM: PORTABLE CHEST 1 VIEW COMPARISON:  10/21/2022 FINDINGS: Single frontal view of the chest demonstrates stable postsurgical changes from median sternotomy and aortic valve replacement. Dual lead pacemaker unchanged. Cardiac silhouette is stable. No airspace disease, effusion, or pneumothorax. No acute displaced fracture. Stable hiatal hernia. IMPRESSION: 1. No acute intrathoracic process. Electronically Signed   By: Sharlet Salina M.D.   On: 10/29/2022 15:29   CT HEAD WO CONTRAST ( )  Result Date: 10/29/2022 CLINICAL DATA:  Trauma. EXAM: CT HEAD WITHOUT CONTRAST TECHNIQUE: Contiguous axial images were obtained from the base of the skull through the vertex without intravenous contrast. RADIATION DOSE REDUCTION: This exam was performed according to the departmental dose-optimization program which includes automated exposure control, adjustment of the mA and/or kV according to patient size and/or use of iterative reconstruction technique. COMPARISON:  Head CT 10/20/2022.  MRI brain 04/10/2018. FINDINGS: Brain: No evidence of acute infarction, hemorrhage, hydrocephalus, extra-axial collection or mass lesion/mass effect. There is mild diffuse atrophy. There is stable moderate periventricular white matter hypodensity, likely chronic small vessel ischemic change. Vascular: Atherosclerotic  calcifications are present within the cavernous internal carotid arteries. Skull: Normal. Negative for fracture or focal lesion. Sinuses/Orbits: No acute finding. Other: None. IMPRESSION: 1. No acute intracranial process. 2. Stable atrophy and chronic small vessel ischemic changes. Electronically Signed   By: Ronney Asters M.D.   On: 10/29/2022 15:15   CT Cervical Spine Wo Contrast  Result Date: 10/29/2022 CLINICAL DATA:  Neck trauma (Age >= 65y) EXAM: CT CERVICAL SPINE WITHOUT CONTRAST TECHNIQUE: Multidetector CT imaging of the cervical spine  was performed without intravenous contrast. Multiplanar CT image reconstructions were also generated. RADIATION DOSE REDUCTION: This exam was performed according to the departmental dose-optimization program which includes automated exposure control, adjustment of the mA and/or kV according to patient size and/or use of iterative reconstruction technique. COMPARISON:  CT cervical spine 11/14/2021. FINDINGS: Alignment: Mild anterolisthesis of C2 on C3 is unchanged. No new sagittal subluxation. Skull base and vertebrae: No evidence of acute fracture. Soft tissues and spinal canal: No prevertebral fluid or swelling. No visible canal hematoma. Disc levels: Similar multilevel degenerative change. Upper chest: Biapical pleuroparenchymal scarring. IMPRESSION: No evidence of acute fracture or traumatic malalignment. Electronically Signed   By: Margaretha Sheffield M.D.   On: 10/29/2022 15:15       LOS: 0 days   [facility] Pager on [email or web address]  10/30/2022, 10:41 AM

## 2022-10-30 NOTE — Progress Notes (Signed)
Called Patient Placement & Dr. Lorrin Goodell to notify them of pt's arrival from [facility].

## 2022-10-30 NOTE — Plan of Care (Signed)
  Problem: Education: Goal: Knowledge of General Education information will improve Description: Including pain rating scale, medication(s)/side effects and non-pharmacologic comfort measures Outcome: Not Progressing   Problem: Health Behavior/Discharge Planning: Goal: Ability to manage health-related needs will improve Outcome: Not Progressing   Problem: Clinical Measurements: Goal: Ability to maintain clinical measurements within normal limits will improve Outcome: Not Progressing Goal: Will remain free from infection Outcome: Not Progressing Goal: Diagnostic test results will improve Outcome: Not Progressing Goal: Respiratory complications will improve Outcome: Not Progressing Goal: Cardiovascular complication will be avoided Outcome: Not Progressing   Problem: Activity: Goal: Risk for activity intolerance will decrease Outcome: Not Progressing   Problem: Nutrition: Goal: Adequate nutrition will be maintained Outcome: Not Progressing   Problem: Coping: Goal: Level of anxiety will decrease Outcome: Not Progressing   Problem: Elimination: Goal: Will not experience complications related to bowel motility Outcome: Not Progressing Goal: Will not experience complications related to urinary retention Outcome: Not Progressing   Problem: Pain Managment: Goal: General experience of comfort will improve Outcome: Not Progressing   Problem: Safety: Goal: Ability to remain free from injury will improve Outcome: Not Progressing   Problem: Skin Integrity: Goal: Risk for impaired skin integrity will decrease Outcome: Not Progressing   Problem: Education: Goal: Knowledge of risk factors and measures for prevention of condition will improve Outcome: Not Progressing   Problem: Coping: Goal: Psychosocial and spiritual needs will be supported Outcome: Not Progressing   Problem: Respiratory: Goal: Will maintain a patent airway Outcome: Not Progressing Goal: Complications  related to the disease process, condition or treatment will be avoided or minimized Outcome: Not Progressing   Problem: Education: Goal: Knowledge of disease or condition will improve Outcome: Not Progressing Goal: Knowledge of secondary prevention will improve (MUST DOCUMENT ALL) Outcome: Not Progressing Goal: Knowledge of patient specific risk factors will improve Elta [place] N/A or DELETE if not current risk factor) Outcome: Not Progressing   Problem: Ischemic Stroke/TIA Tissue Perfusion: Goal: Complications of ischemic stroke/TIA will be minimized Outcome: Not Progressing   Problem: Coping: Goal: Will verbalize positive feelings about self Outcome: Not Progressing Goal: Will identify appropriate support needs Outcome: Not Progressing   Problem: Health Behavior/Discharge Planning: Goal: Ability to manage health-related needs will improve Outcome: Not Progressing Goal: Goals will be collaboratively established with patient/family Outcome: Not Progressing   Problem: Self-Care: Goal: Ability to participate in self-care as condition permits will improve Outcome: Not Progressing Goal: Verbalization of feelings and concerns over difficulty with self-care will improve Outcome: Not Progressing Goal: Ability to communicate needs accurately will improve Outcome: Not Progressing   Problem: Nutrition: Goal: Risk of aspiration will decrease Outcome: Not Progressing Goal: Dietary intake will improve Outcome: Not Progressing

## 2022-10-30 NOTE — ED Notes (Signed)
EMTALA Reivewed by this RN.

## 2022-10-30 NOTE — Progress Notes (Signed)
EEG complete - results pending 

## 2022-10-30 NOTE — H&P (Signed)
History and Physical    [name] HYW:[id number] DOB: [birth year] DOA: 10/30/2022  PCP: Doreene Nest, NP  Patient coming from:  [facility] ED  Chief Complaint: Left hip pain  HPI: [name] is a 89 y.o. female with medical history significant of Alzheimer's dementia, hypertension, hyperlipidemia, asthma, CAD status post CABG, chronic HFpEF, stroke, hypothyroidism, CKD stage IIIa, LBBB, history of aortic valve replacement, mitral valve prolapse/mitral regurgitation, paroxysmal atrial tachycardia/A-fib not on anticoagulation per cardiology due to extremely low burden of arrhythmia, has a pacemaker, AAA, PAD.  Recently admitted 12/28-12/29 for acute metabolic encephalopathy secondary to COVID-19 infection.  Patient was admitted due to left lower extremity weakness and concern for CVA but neurology felt this was less likely.  CT head negative for acute finding.  Brain MRI could not be done due to presence of a pacemaker.  Chest x-ray was showing an infiltrate and she was treated with Paxlovid.  She was not hypoxic.  Patient presented to [facility] ED yesterday for evaluation of left hip pain, recurrent left-sided weakness, and expressive aphasia.  She had 2 falls in the last 24 hours.  Labs significant for WBC 13.2, potassium 2.9, transaminases borderline elevated but no significant elevation of alkaline phosphatase or T. bili.  X-ray of left hip/pelvis showing acute left femoral neck fracture.  CT head/C-spine negative for acute finding.  Chest x-ray negative for acute finding. ED physician spoke to Dr. Selina Cooley with neurology who recommended brain MRI for further evaluation could not be done at [place] patient has a pacemaker.  Case was discussed with radiology and ED physician was told that MRI could be done at [facility].  ED physician consulted Dr. Ave Filter with orthopedics who agrees to see the patient in consultation at [facility].  EKG was showing irregular rhythm and ED provider had called  cardiology due to concern for possible A-fib.  Cardiology reviewed EKG and able to see multiple P waves and not convinced that this is A-fib.  Recommended interrogating her pacemaker.  Patient has dementia and is not able to give any history.  History provided by her daughter and granddaughter at bedside.  Family states patient has been in a nursing facility since after her recent hospital discharge from [place].  In the past 2 days, she has had falls at the facility and has complained of left hip pain.  Yesterday family noticed that the patient was having difficulty with her speech/word finding difficulty and the left side of her face was drooping.  She has been increasingly more confused.  They were told that patient would need an MRI to assess for possible stroke.  Review of Systems:  Review of Systems  Reason unable to perform ROS: AMS.    Past Medical History:  Diagnosis Date   AAA (abdominal aortic aneurysm) (HCC) 01/03/13   3.5x3.3cm   Alzheimer's dementia (HCC)    "probably middle stage" (11/05/2014)   Anemia    "hx of chronic" (11/05/2014)   Aortic stenosis 03/29/2008   biological prosthetic replacement   Arthritis    "joints" (11/05/2014)   Asthma    Avascular necrosis of bone of hip, right (HCC) 07/28/2018   Chronic asthmatic bronchitis    "q fall and winter" (11/05/2014)   Closed nondisplaced fracture of middle phalanx of lesser toe of right foot 01/05/2021   Coronary artery disease    CABG 03/29/08   Dyslipidemia    Exertional shortness of breath    Family history of adverse reaction to anesthesia    "  daughter gets PONV too"   Heart murmur    Hypertension    Hypothyroidism    LBBB (left bundle branch block)    Migraines    "none in years' (11/05/2014)   Mobitz type 2 second degree AV block 10/31/2014   MVP (mitral valve prolapse)    with mild mitral insufficiency/notes 08/17/2013   Near syncope 08/17/2013   PONV (postoperative nausea and vomiting)    Presence of permanent  cardiac pacemaker    Sinus pause 10/31/2014   Stroke (HCC) 03/2008   "2 light strokes"; denies residual on 11/05/2014   Urinary frequency     Past Surgical History:  Procedure Laterality Date   ABDOMINAL HYSTERECTOMY  1977   AORTIC VALVE REPLACEMENT  03/29/2008   PERICARDIAL TISSUE VALVE   APPENDECTOMY  ?1977   CARDIAC CATHETERIZATION  ~ 2009   CATARACT EXTRACTION W/ INTRAOCULAR LENS IMPLANT Left    CORONARY ARTERY BYPASS GRAFT  03/29/2008   LIMA TO LAD,SVG TO CX,SVG TO LEFT POSTEROLATERAL BRANCH   HIP FRACTURE SURGERY Right 06/2010   "3 metal screws"    INSERT / REPLACE / REMOVE PACEMAKER  11/05/2014   LOOP RECORDER EXPLANT N/A 11/05/2014   Procedure: LOOP RECORDER EXPLANT;  Surgeon: [name], MD;  Location: [facility] CATH LAB;  Service: Cardiovascular;  Laterality: N/A;   LOOP RECORDER IMPLANT N/A 09/17/2013   Procedure: LOOP RECORDER IMPLANT;  Surgeon: [name], MD;  Location: [facility] CATH LAB;  Service: Cardiovascular;  Laterality: N/A;   PERMANENT PACEMAKER INSERTION N/A 11/05/2014   Procedure: PERMANENT PACEMAKER INSERTION;  Surgeon: [name], MD;  Location: [facility] CATH LAB;  Service: Cardiovascular;  Laterality: N/A;   REFRACTIVE SURGERY Bilateral    /notes 04/28/2008 (08/17/2013)   RETINAL DETACHMENT SURGERY Left 1994   [name] 04/10/2008 (08/17/2013)   TIBIAL TUBERCLERPLASTY  11/05/2014   tooth pulled   06/2018   TOTAL HIP ARTHROPLASTY Right 07/31/2018   Procedure: RIGHT TOTAL HIP ARTHROPLASTY ANTERIOR APPROACH;  Surgeon: Gean Birchwood, MD;  Location: [facility] ORS;  Service: Orthopedics;  Laterality: Right;   UTERINE FIBROID SURGERY  1960's     reports that she has never smoked. She has never used smokeless tobacco. She reports that she does not drink alcohol and does not use drugs.  Allergies  Allergen Reactions   Codeine Nausea And Vomiting and Other (See Comments)    Patient gets violently ill   Morphine And Related Nausea And Vomiting and Other (See Comments)    Patient get  violently ill    Family History  Problem Relation Age of Onset   Heart failure Mother    Diabetes Mother    Hypertension Mother    Hyperlipidemia Mother    Melanoma Grandchild    Colon cancer Neg Hx     Prior to Admission medications   Medication Sig Start Date End Date Taking? Authorizing Provider  acetaminophen (TYLENOL) 500 MG tablet Take 1,000 mg by mouth every 8 (eight) hours as needed for mild pain or moderate pain.    [provider]  alendronate (FOSAMAX) 70 MG tablet Take 1 tablet (70 mg total) by mouth once a week. No food or other medications for 30 min. Avoid laying flat for two hours. Patient taking differently: Take 70 mg by mouth every Sunday. No food or other medications for 30 min. Avoid laying flat for two hours. 04/19/22   Doreene Nest, NP  amoxicillin (AMOXIL) 500 MG tablet Take 2,000 mg by mouth See admin instructions. Take 2,000mg  by mouth  1 hour prior to dental appointments 12/10/15   [provider]  aspirin EC (ASPIRIN LOW DOSE) 81 MG tablet Take 1 tablet (81 mg total) by mouth daily. 09/19/22   Doreene Nest, NP  atorvastatin (LIPITOR) 40 MG tablet TAKE 1 TABLET BY MOUTH DAILY FOR CHOLESTEROL 10/26/22   Doreene Nest, NP  brimonidine (ALPHAGAN) 0.2 % ophthalmic solution Place 1 drop into the left eye 2 (two) times daily. 07/10/18   [provider]  Calcium Carbonate-Vit D-Min (CALTRATE 600+D PLUS MINERALS) 600-800 MG-UNIT TABS Take 1 tablet by mouth twice daily for calcium and vitamin D. Patient taking differently: Take 1 tablet by mouth daily with supper. 09/30/22   Doreene Nest, NP  fluticasone [facility]) 50 MCG/ACT nasal spray PLACE 1 SPRAY INTO BOTH NOSTRILS DAILY 09/19/22   Doreene Nest, NP  fluticasone-salmeterol (ADVAIR HFA) [phone] MCG/ACT inhaler Inhale 1 puff into the lungs 2 (two) times daily.    [provider]  furosemide (LASIX) 40 MG tablet TAKE 1 TABLET BY MOUTH DAILY 10/13/22   Croitoru, Mihai,  MD  guaiFENesin-dextromethorphan (ROBITUSSIN DM) 100-10 MG/5ML syrup Take 10 mLs by mouth every 4 (four) hours as needed for cough. 10/22/22   Leatha Gilding, MD  levothyroxine (SYNTHROID) 50 MCG tablet TAKE 1 TABLET BY MOUTH EVERY MORNING ON AN EMPTY STOMACH WITH WATER ONLY. NO FOOD OR OTHER MEDICATIONS FOR 30 MINS. Patient taking differently: Take 50 mcg by mouth daily before breakfast. TAKE 1 TABLET BY MOUTH EVERY MORNING ON AN EMPTY STOMACH WITH WATER ONLY. NO FOOD OR OTHER MEDICATIONS FOR 30 MINS. 09/05/22   Doreene Nest, NP  losartan (COZAAR) 100 MG tablet TAKE 1 TABLET BY MOUTH DAILY FOR BLOOD PRESSURE 10/14/22   Doreene Nest, NP  potassium chloride (KLOR-CON M) 10 MEQ tablet TAKE 2 TABLETS BY MOUTH 2 TIMES DAILY 10/26/22   Croitoru, Rachelle Hora, MD    Physical Exam: Vitals:   10/30/22 0144  BP: 128/66  Pulse: 74  Resp: 15  Temp: 98.6 F (37 C)  TempSrc: Axillary  SpO2: 94%    Physical Exam Vitals reviewed.  Constitutional:      General: She is not in acute distress. HENT:     Head: Normocephalic and atraumatic.  Eyes:     Extraocular Movements: Extraocular movements intact.  Cardiovascular:     Rate and Rhythm: Normal rate and regular rhythm.     Pulses: Normal pulses.  Pulmonary:     Effort: Pulmonary effort is normal. No respiratory distress.     Breath sounds: Normal breath sounds. No wheezing or rales.  Abdominal:     General: Bowel sounds are normal. There is no distension.     Palpations: Abdomen is soft.     Tenderness: There is no abdominal tenderness.  Musculoskeletal:     Cervical back: Normal range of motion.     Right lower leg: No edema.     Left lower leg: No edema.     Comments: Left lower extremity shortened and externally rotated  Skin:    General: Skin is warm and dry.  Neurological:     General: No focal deficit present.     Mental Status: She is alert.     Labs on Admission: I have personally reviewed following labs and imaging  studies  CBC: Recent Labs  Lab 10/29/22 1436  WBC 13.2*  NEUTROABS 11.8*  HGB 15.0  HCT 43.4  MCV 88.6  PLT 240   Basic Metabolic Panel: Recent  Labs  Lab 10/29/22 1436  NA 135  K 2.9*  CL 95*  CO2 25  GLUCOSE 136*  BUN 15  CREATININE 0.99  CALCIUM 9.1   GFR: Estimated Creatinine Clearance: 42.6 mL/min (by C-G formula based on SCr of 0.99 mg/dL). Liver Function Tests: Recent Labs  Lab 10/29/22 1436  AST 44*  ALT 46*  ALKPHOS 80  BILITOT 1.4*  PROT 7.6  ALBUMIN 4.3   Recent Labs  Lab 10/29/22 1436  LIPASE 30   No results for input(s): "AMMONIA" in the last 168 hours. Coagulation Profile: Recent Labs  Lab 10/29/22 1436  INR 1.0   Cardiac Enzymes: No results for input(s): "CKTOTAL", "CKMB", "CKMBINDEX", "TROPONINI" in the last 168 hours. BNP (last 3 results) No results for input(s): "PROBNP" in the last 8760 hours. HbA1C: No results for input(s): "HGBA1C" in the last 72 hours. CBG: No results for input(s): "GLUCAP" in the last 168 hours. Lipid Profile: No results for input(s): "CHOL", "HDL", "LDLCALC", "TRIG", "CHOLHDL", "LDLDIRECT" in the last 72 hours. Thyroid Function Tests: No results for input(s): "TSH", "T4TOTAL", "FREET4", "T3FREE", "THYROIDAB" in the last 72 hours. Anemia Panel: No results for input(s): "VITAMINB12", "FOLATE", "FERRITIN", "TIBC", "IRON", "RETICCTPCT" in the last 72 hours. Urine analysis:    Component Value Date/Time   COLORURINE YELLOW (A) 10/29/2022 1436   APPEARANCEUR CLEAR (A) 10/29/2022 1436   LABSPEC 1.009 10/29/2022 1436   PHURINE 6.0 10/29/2022 1436   GLUCOSEU NEGATIVE 10/29/2022 1436   HGBUR NEGATIVE 10/29/2022 1436   BILIRUBINUR NEGATIVE 10/29/2022 1436   BILIRUBINUR neg 08/13/2022 1509   KETONESUR NEGATIVE 10/29/2022 1436   PROTEINUR NEGATIVE 10/29/2022 1436   UROBILINOGEN 0.2 08/13/2022 1509   UROBILINOGEN 0.2 08/17/2013 1552   NITRITE NEGATIVE 10/29/2022 1436   LEUKOCYTESUR NEGATIVE 10/29/2022 1436     Radiological Exams on Admission: DG Hip Unilat W or Wo Pelvis 2-3 Views Left  Result Date: 10/29/2022 CLINICAL DATA:  Left hip pain after fall EXAM: DG HIP (WITH OR WITHOUT PELVIS) 2-3V LEFT COMPARISON:  Left hip x-ray 10/13/2022 FINDINGS: There is an acute left femoral neck fracture with mild superior displacement of the distal fracture fragment. There is no dislocation. The bones are osteopenic. Right hip arthroplasty appears in anatomic alignment. IMPRESSION: Acute left femoral neck fracture. Electronically Signed   By: Darliss Cheney M.D.   On: 10/29/2022 15:38   DG Chest Portable 1 View  Result Date: 10/29/2022 CLINICAL DATA:  Left hip pain after fall EXAM: PORTABLE CHEST 1 VIEW COMPARISON:  10/21/2022 FINDINGS: Single frontal view of the chest demonstrates stable postsurgical changes from median sternotomy and aortic valve replacement. Dual lead pacemaker unchanged. Cardiac silhouette is stable. No airspace disease, effusion, or pneumothorax. No acute displaced fracture. Stable hiatal hernia. IMPRESSION: 1. No acute intrathoracic process. Electronically Signed   By: Sharlet Salina M.D.   On: 10/29/2022 15:29   CT HEAD WO CONTRAST ( )  Result Date: 10/29/2022 CLINICAL DATA:  Trauma. EXAM: CT HEAD WITHOUT CONTRAST TECHNIQUE: Contiguous axial images were obtained from the base of the skull through the vertex without intravenous contrast. RADIATION DOSE REDUCTION: This exam was performed according to the departmental dose-optimization program which includes automated exposure control, adjustment of the mA and/or kV according to patient size and/or use of iterative reconstruction technique. COMPARISON:  Head CT 10/20/2022.  MRI brain 04/10/2018. FINDINGS: Brain: No evidence of acute infarction, hemorrhage, hydrocephalus, extra-axial collection or mass lesion/mass effect. There is mild diffuse atrophy. There is stable moderate periventricular white matter hypodensity, likely chronic  small vessel  ischemic change. Vascular: Atherosclerotic calcifications are present within the cavernous internal carotid arteries. Skull: Normal. Negative for fracture or focal lesion. Sinuses/Orbits: No acute finding. Other: None. IMPRESSION: 1. No acute intracranial process. 2. Stable atrophy and chronic small vessel ischemic changes. Electronically Signed   By: Ronney Asters M.D.   On: 10/29/2022 15:15   CT Cervical Spine Wo Contrast  Result Date: 10/29/2022 CLINICAL DATA:  Neck trauma (Age >= 65y) EXAM: CT CERVICAL SPINE WITHOUT CONTRAST TECHNIQUE: Multidetector CT imaging of the cervical spine was performed without intravenous contrast. Multiplanar CT image reconstructions were also generated. RADIATION DOSE REDUCTION: This exam was performed according to the departmental dose-optimization program which includes automated exposure control, adjustment of the mA and/or kV according to patient size and/or use of iterative reconstruction technique. COMPARISON:  CT cervical spine 11/14/2021. FINDINGS: Alignment: Mild anterolisthesis of C2 on C3 is unchanged. No new sagittal subluxation. Skull base and vertebrae: No evidence of acute fracture. Soft tissues and spinal canal: No prevertebral fluid or swelling. No visible canal hematoma. Disc levels: Similar multilevel degenerative change. Upper chest: Biapical pleuroparenchymal scarring. IMPRESSION: No evidence of acute fracture or traumatic malalignment. Electronically Signed   By: Margaretha Sheffield M.D.   On: 10/29/2022 15:15    EKG: Independently reviewed.  Irregular rhythm, ?Afib. LAFB and RBBB not new.  Assessment and Plan  Recurrent strokelike symptoms Patient presenting with concern for recurrent left-sided weakness, facial droop, and expressive aphasia. CT head negative for acute finding. Neurology had recommended brain MRI for further evaluation. -Neurology consulted -Patient has a pacemaker and transferred to [facility] for brain MRI which has been  ordered.  Acute left femoral neck fracture In the setting of recent falls at her nursing facility. -Orthopedics consulted -Keep n.p.o. -Nonweightbearing -Pain management  Arrhythmia/ ?Afib -EKG was showing irregular rhythm and ED provider had called cardiology due to concern for possible A-fib.  Cardiology reviewed EKG and able to see multiple P waves and not convinced that this is A-fib.  Recommended interrogating her pacemaker. -Cardiac monitoring -Monitor potassium and magnesium -Please consult cardiology in the morning as she will need preop evaluation given hip fracture.  Hypokalemia Likely due to diuretic use and possibly poor oral intake. Patient received potassium supplement in the ED. -Cardiac monitoring -Monitor potassium and magnesium levels, continue to replace if low  Recent COVID-19 infection Tested positive on 12/28 and chest x-ray was concerning for pneumonia.  She was treated with Paxlovid. Repeat chest x-ray negative for acute finding.  No hypoxia or respiratory distress. -Continue isolation precautions per protocol  Mild leukocytosis Likely reactive.  Patient is afebrile. Chest x-ray is not suggestive of pneumonia and UA without signs of infection. -Repeat CBC  Chronic HFpEF Echo done in July 2020 showing EF 60 to 65%.  Does not appear volume overloaded on exam. -Check BNP  Alzheimer's dementia Hypertension: Blood pressure stable. Hyperlipidemia Asthma: Stable, no signs of acute exacerbation CAD: Patient is not endorsing anginal symptoms. Hypothyroidism -Pharmacy med rec pending.  DVT prophylaxis: SCDs Code Status: Patient is listed as DNR during her recent hospitalization.  I discussed CODE STATUS with the patient's daughter and granddaughter at bedside and they want the patient to be FULL CODE. Level of care: Telemetry bed Admission status: It is my clinical opinion that admission to INPATIENT is reasonable and necessary because of the expectation that  this patient will require hospital care that crosses at least 2 midnights to treat this condition based on the medical complexity of  the problems presented.  Given the aforementioned information, the predictability of an adverse outcome is felt to be significant.   John Giovanni MD [facility]  If 7PM-7AM, please contact night-coverage [email or web address]  10/30/2022, 3:46 AM

## 2022-10-30 NOTE — ED Notes (Signed)
Pt accepted to [facility] [place] [facility] per [employer], Warehouse manager. Call report to [phone]. [facility] to transport

## 2022-10-30 NOTE — Consult Note (Signed)
Neurology Consultation  Reason for Consult: speech disturbance and left sided weakness Referring Physician: Dr. Maryland Pink  CC: none  History is obtained from: Family and chart  HPI: [name] is a 89 y.o. female with history of Alzheimer's dementia, hypertension, hyperlipidemia, asthma, CAD status post CABG, CHF, hypothyroidism, CKD stage III, left bundle branch block, aortic valve replacement, mitral regurgitation, remote A-fib not on anticoagulation (no afib the past 13 months), ascending aortic aneurysm, PAD and recent hip fracture who presents with complaint of waxing and waning speech disturbance since 12/25 as well as intermittent left leg weakness and one episode of left facial droop.  Per patient's daughter, patient is normally alert oriented and fluently conversant, although she has recently moved from the independent to assisted living section of her facility due to memory issues.  Specifically, patient was forgetting where her room was.  Starting on 12/25, patient had intermittent difficulty speaking, she would try to start a sentence and trail off, was unable to name some objects and would be unable to express her thoughts with words.  She also was noted to be dragging her left leg and had falls at her facility.  She was admitted to [facility] and found to have a left hip fracture, as well as COVID-19 infection.  There was concern for atrial fibrillation as well but on cardiology review of pacemaker, no Afib in the last 13 months.  Neurology is called to evaluate for stroke or seizure in setting of speech disturbance and left-sided weakness.  Patient is unable to have an MRI due to incompatible pacemaker.  LKW: 12/25 TNK given?: no, outside of window IR Thrombectomy? No, side of window Modified Rankin Scale: 3-Moderate disability-requires help but walks WITHOUT assistance  ROS: Unable to obtain due to altered mental status.   Past Medical History:  Diagnosis  Date   AAA (abdominal aortic aneurysm) ([place]) 01/03/13   3.5x3.3cm   Alzheimer's dementia ([place])    "probably middle stage" (11/05/2014)   Anemia    "hx of chronic" (11/05/2014)   Aortic stenosis 03/29/2008   biological prosthetic replacement   Arthritis    "joints" (11/05/2014)   Asthma    Avascular necrosis of bone of hip, right ([place]) 07/28/2018   Chronic asthmatic bronchitis    "q fall and winter" (11/05/2014)   Closed nondisplaced fracture of middle phalanx of lesser toe of right foot 01/05/2021   Coronary artery disease    CABG 03/29/08   Dyslipidemia    Exertional shortness of breath    Family history of adverse reaction to anesthesia    "daughter gets PONV too"   Heart murmur    Hypertension    Hypothyroidism    LBBB (left bundle branch block)    Migraines    "none in years' (11/05/2014)   Mobitz type 2 second degree AV block 10/31/2014   MVP (mitral valve prolapse)    with mild mitral insufficiency/notes 08/17/2013   Near syncope 08/17/2013   PONV (postoperative nausea and vomiting)    Presence of permanent cardiac pacemaker    Sinus pause 10/31/2014   Stroke ([place]) 03/2008   "2 light strokes"; denies residual on 11/05/2014   Urinary frequency      Family History  Problem Relation Age of Onset   Heart failure Mother    Diabetes Mother    Hypertension Mother    Hyperlipidemia Mother    Melanoma Grandchild    Colon cancer Neg Hx      Social History:  reports that she has never smoked. She has never used smokeless tobacco. She reports that she does not drink alcohol and does not use drugs.  Medications  Current Facility-Administered Medications:    acetaminophen (TYLENOL) tablet 650 mg, 650 mg, Oral, Q6H PRN, 650 mg at 10/30/22 0538 **OR** acetaminophen (TYLENOL) suppository 650 mg, 650 mg, Rectal, Q6H PRN, Shela Leff, MD   aspirin EC tablet 81 mg, 81 mg, Oral, Daily, Bonnielee Haff, MD   atorvastatin (LIPITOR) tablet 40 mg, 40 mg, Oral, Daily, Bonnielee Haff,  MD   brimonidine (ALPHAGAN) 0.2 % ophthalmic solution 1 drop, 1 drop, Left Eye, BID, Bonnielee Haff, MD   HYDROmorphone (DILAUDID) injection 0.5 mg, 0.5 mg, Intravenous, Q3H PRN, Bonnielee Haff, MD, 0.5 mg at 10/30/22 0943   levothyroxine (SYNTHROID) tablet 50 mcg, 50 mcg, Oral, Q0600, Bonnielee Haff, MD   ondansetron [facility]) injection 4 mg, 4 mg, Intravenous, Q6H PRN, Bonnielee Haff, MD   Exam: Current vital signs: BP (!) 157/68 (BP Location: Right Arm)   Pulse 69   Temp 98.4 F (36.9 C) (Oral)   Resp 17   SpO2 97%  Vital signs in last 24 hours: Temp:  [98.4 F (36.9 C)-98.6 F (37 C)] 98.4 F (36.9 C) (01/06 1104) Pulse Rate:  [63-89] 69 (01/06 1104) Resp:  [13-19] 17 (01/06 1104) BP: (121-179)/(60-86) 157/68 (01/06 1104) SpO2:  [94 %-100 %] 97 % (01/06 1104)  GENERAL: Drowsy, in no acute distress, requires stimulation to stay awake and attentive exam Psych: Affect appropriate for situation, patient is calm and cooperative with examination Head: Normocephalic and atraumatic, without obvious abnormality EENT: Normal conjunctivae, moist mucous membranes, no OP obstruction LUNGS: Normal respiratory effort. Non-labored breathing on room air CV: Atrial fibrillation on monitor Extremities: warm, well perfused, without obvious deformity  NEURO:  Mental Status: Drowsy, oriented to person only She is not able to provide a clear and coherent history of present illness. Speech/Language: speech is halting and stuttering, with patient starting sentences and then trailing off.  Some of this may be attributed to recent pain medication administration.   Patient is able to name a thumb but not a fist or pinky finger.  Repetition is intact but requires several prompts.  Voice is slightly dysarthric  No neglect is noted On later attending eval, no comprehensible speech output, following some commands intermittently  Cranial Nerves:  II: PERRL III, IV, VI: EOMI.   V: Sensation is intact  to light touch VII: Face is symmetric resting and   VIII: Hearing intact to voice IX, X: Voice is dysarthric XI: Normal sternocleidomastoid and trapezius muscle strength XII: Tongue protrudes midline without fasciculations.   Motor: 5 out of 5 strength in bilateral upper extremities, 4+ out of 5 strength in right lower extremity, proximal testing of left lower extremity deferred due to hip fracture, 5 out of 5 dorsiflexion and plantarflexion bilaterally Sensation: Intact to light touch bilaterally in all four extremities.    Gait: Deferred  Labs I have reviewed labs in epic and the results pertinent to this consultation are:   CBC    Component Value Date/Time   WBC 7.6 10/30/2022 1108   RBC 4.11 10/30/2022 1108   HGB 12.9 10/30/2022 1108   HCT 36.6 10/30/2022 1108   PLT 181 10/30/2022 1108   MCV 89.1 10/30/2022 1108   MCH 31.4 10/30/2022 1108   MCHC 35.2 10/30/2022 1108   RDW 12.9 10/30/2022 1108   LYMPHSABS 0.5 (L) 10/29/2022 1436   MONOABS 0.8 10/29/2022 1436  EOSABS 0.0 10/29/2022 1436   BASOSABS 0.0 10/29/2022 1436    CMP     Component Value Date/Time   NA 135 10/29/2022 1436   NA 144 11/24/2021 1429   K 2.9 (L) 10/29/2022 1436   CL 95 (L) 10/29/2022 1436   CO2 25 10/29/2022 1436   GLUCOSE 136 (H) 10/29/2022 1436   BUN 15 10/29/2022 1436   BUN 14 11/24/2021 1429   CREATININE 0.99 10/29/2022 1436   CREATININE 1.36 (H) 08/13/2022 1501   CALCIUM 9.1 10/29/2022 1436   PROT 7.6 10/29/2022 1436   ALBUMIN 4.3 10/29/2022 1436   AST 44 (H) 10/29/2022 1436   ALT 46 (H) 10/29/2022 1436   ALKPHOS 80 10/29/2022 1436   BILITOT 1.4 (H) 10/29/2022 1436   GFRNONAA 56 (L) 10/29/2022 1436   GFRAA 47 (L) 07/03/2019 1614    Lipid Panel     Component Value Date/Time   CHOL 137 10/22/2022 0600   TRIG 103 10/22/2022 0600   HDL 40 (L) 10/22/2022 0600   CHOLHDL 3.4 10/22/2022 0600   VLDL 21 10/22/2022 0600   LDLCALC 76 10/22/2022 0600   LDLCALC 65 08/06/2022 1542      Imaging I have reviewed the images obtained:  CT-scan of the brain 1/5: No acute abnormality, chronic mild vessel ischemic disease and atrophy  Resting EEG: Pending  Assessment: 89 year old patient with the above past medical history presents with hip fracture and speech difficulties with left-sided weakness which has been present since 12/25.  She also has had a fall with a hip fracture and atrial fibrillation.  Family reports that the speech deficits are overall stable though they wax and wane and that patient normally is able to carry on a conversation.  On exam, patient is quite drowsy and will often trail off during sentences and speak nonsense words or mumble.  However, she has recently received Dilaudid for hip pain.  She is able to repeat sentences with multiple prompts and can name some objects but not others.  No left-sided weakness was appreciated on exam, however full exam of left leg was deferred due to hip fracture.  CT head is negative for acute abnormalities. Given this was obtained many hours after symptom onset, it is of sufficient sensitivity to rule out any clinically significant stroke.   Will obtain resting EEG to rule out seizure activity since deficits have been intermittent and dementia is a risk factor for seizure.    Impression: Speech disturbance and intermittent left-sided weakness in patient with hip fracture  Recommendations: -No neurological contraindication to operative hip fracture repair -Routine EEG to assess for potential underlying epilepsy -If above studies is negative, neurology will follow as needed.  Pt seen by NP/Neuro and later by MD. Note/plan to be edited by MD as needed.  Cortney E Ernestina Columbia , MSN, AGACNP-BC [facility] See Amion for schedule and pager information 10/30/2022 12:05 PM  Brooke Dare MD-PhD [facility] [phone] Attending Neurologist's note:  I personally saw this patient, gathering history,  performing a full neurologic examination, reviewing relevant labs, personally reviewing relevant imaging including Head CT, and formulated the assessment and plan, adding the note above for completeness and clarity to accurately reflect my thoughts

## 2022-10-30 NOTE — Procedures (Signed)
Routine EEG Report  [name] is a 89 y.o. female with a history of altered mental status who is undergoing an EEG to evaluate for seizures.  Report: This EEG was acquired with electrodes placed according to the International 10-20 electrode system (including Fp1, Fp2, F3, F4, C3, C4, P3, P4, O1, O2, T3, T4, T5, T6, A1, A2, Fz, Cz, Pz). The following electrodes were missing or displaced: none.  The occipital dominant rhythm was 5-7 Hz. This activity is reactive to stimulation. Drowsiness was manifested by background fragmentation; deeper stages of sleep were identified by K complexes and sleep spindles. There was no focal slowing. There were no interictal epileptiform discharges. There were no electrographic seizures identified. Photic stimulation and hyperventilation were not performed.   Impression and clinical correlation: This EEG was obtained while awake and asleep and is abnormal due to mild diffuse slowing indicative of global cerebral dysfunction. Epileptiform abnormalities were not seen during this recording.  Su Monks, MD [facility] [phone]  If 7pm- 7am, please page neurology on call as listed in [place].

## 2022-10-31 ENCOUNTER — Inpatient Hospital Stay (HOSPITAL_COMMUNITY): Payer: Medicare Other | Admitting: Anesthesiology

## 2022-10-31 ENCOUNTER — Encounter (HOSPITAL_COMMUNITY): Payer: Self-pay | Admitting: Internal Medicine

## 2022-10-31 ENCOUNTER — Other Ambulatory Visit (HOSPITAL_COMMUNITY): Payer: Medicare Other

## 2022-10-31 ENCOUNTER — Encounter (HOSPITAL_COMMUNITY)
Admission: RE | Disposition: A | Discharge status: Skilled Nursing Facility | Payer: Self-pay | Source: Other Acute Inpatient Hospital | Attending: Internal Medicine

## 2022-10-31 ENCOUNTER — Inpatient Hospital Stay (HOSPITAL_COMMUNITY): Payer: Medicare Other

## 2022-10-31 ENCOUNTER — Other Ambulatory Visit: Payer: Self-pay

## 2022-10-31 DIAGNOSIS — S72002A Fracture of unspecified part of neck of left femur, initial encounter for closed fracture: Secondary | ICD-10-CM

## 2022-10-31 DIAGNOSIS — I2510 Atherosclerotic heart disease of native coronary artery without angina pectoris: Secondary | ICD-10-CM

## 2022-10-31 DIAGNOSIS — I499 Cardiac arrhythmia, unspecified: Secondary | ICD-10-CM

## 2022-10-31 DIAGNOSIS — Z96642 Presence of left artificial hip joint: Secondary | ICD-10-CM

## 2022-10-31 DIAGNOSIS — I10 Essential (primary) hypertension: Secondary | ICD-10-CM

## 2022-10-31 DIAGNOSIS — E039 Hypothyroidism, unspecified: Secondary | ICD-10-CM

## 2022-10-31 HISTORY — PX: TOTAL HIP ARTHROPLASTY: SHX124

## 2022-10-31 LAB — SURGICAL PCR SCREEN
MRSA, PCR: NEGATIVE
Staphylococcus aureus: NEGATIVE

## 2022-10-31 SURGERY — ARTHROPLASTY, HIP, TOTAL, ANTERIOR APPROACH
Anesthesia: General | Site: Hip | Laterality: Left

## 2022-10-31 MED ORDER — KETOROLAC TROMETHAMINE 30 MG/ML IJ SOLN
INTRAMUSCULAR | Status: DC | PRN
  Administered 2022-10-31: 30 mg

## 2022-10-31 MED ORDER — LIDOCAINE 2% (20 MG/ML) 5 ML SYRINGE
INTRAMUSCULAR | Status: AC
  Filled 2022-10-31: qty 5

## 2022-10-31 MED ORDER — EPHEDRINE SULFATE-NACL 50-0.9 MG/10ML-% IV SOSY
PREFILLED_SYRINGE | INTRAVENOUS | Status: DC | PRN
  Administered 2022-10-31: 10 mg via INTRAVENOUS

## 2022-10-31 MED ORDER — ONDANSETRON HCL 4 MG/2ML IJ SOLN
INTRAMUSCULAR | Status: AC
  Filled 2022-10-31: qty 2

## 2022-10-31 MED ORDER — LIDOCAINE 2% (20 MG/ML) 5 ML SYRINGE
INTRAMUSCULAR | Status: DC | PRN
  Administered 2022-10-31: 20 mg via INTRAVENOUS

## 2022-10-31 MED ORDER — POLYETHYLENE GLYCOL 3350 17 G PO PACK
17.0000 g | PACK | Freq: Two times a day (BID) | ORAL | Status: DC
  Administered 2022-10-31 – 2022-11-08 (×10): 17 g via ORAL
  Filled 2022-10-31 (×12): qty 1

## 2022-10-31 MED ORDER — TRANEXAMIC ACID-NACL 1000-0.7 MG/100ML-% IV SOLN
INTRAVENOUS | Status: AC
  Filled 2022-10-31: qty 100

## 2022-10-31 MED ORDER — BUPIVACAINE-EPINEPHRINE (PF) 0.5% -1:200000 IJ SOLN
INTRAMUSCULAR | Status: DC | PRN
  Administered 2022-10-31: 30 mL

## 2022-10-31 MED ORDER — CEFAZOLIN SODIUM-DEXTROSE 2-4 GM/100ML-% IV SOLN
2.0000 g | Freq: Four times a day (QID) | INTRAVENOUS | Status: AC
  Administered 2022-10-31 (×2): 2 g via INTRAVENOUS
  Filled 2022-10-31 (×2): qty 100

## 2022-10-31 MED ORDER — DEXAMETHASONE SODIUM PHOSPHATE 10 MG/ML IJ SOLN
10.0000 mg | Freq: Once | INTRAMUSCULAR | Status: AC
  Administered 2022-11-01: 10 mg via INTRAVENOUS
  Filled 2022-10-31: qty 1

## 2022-10-31 MED ORDER — METHOCARBAMOL 1000 MG/10ML IJ SOLN: 500.0000 mg | Freq: Four times a day (QID) | INTRAVENOUS | Status: DC | PRN

## 2022-10-31 MED ORDER — ASPIRIN 81 MG PO CHEW
81.0000 mg | CHEWABLE_TABLET | Freq: Two times a day (BID) | ORAL | Status: DC
  Administered 2022-10-31 – 2022-11-08 (×16): 81 mg via ORAL
  Filled 2022-10-31 (×16): qty 1

## 2022-10-31 MED ORDER — FENTANYL CITRATE (PF) 250 MCG/5ML IJ SOLN
INTRAMUSCULAR | Status: DC | PRN
  Administered 2022-10-31 (×2): 50 ug via INTRAVENOUS
  Administered 2022-10-31: 150 ug via INTRAVENOUS

## 2022-10-31 MED ORDER — ONDANSETRON HCL 4 MG/2ML IJ SOLN
INTRAMUSCULAR | Status: DC | PRN
  Administered 2022-10-31: 4 mg via INTRAVENOUS

## 2022-10-31 MED ORDER — TRANEXAMIC ACID-NACL 1000-0.7 MG/100ML-% IV SOLN
1000.0000 mg | Freq: Once | INTRAVENOUS | Status: AC
  Administered 2022-10-31: 1000 mg via INTRAVENOUS
  Filled 2022-10-31 (×2): qty 100

## 2022-10-31 MED ORDER — CEFAZOLIN SODIUM-DEXTROSE 1-4 GM/50ML-% IV SOLN
INTRAVENOUS | Status: DC | PRN
  Administered 2022-10-31: 1 g via INTRAVENOUS

## 2022-10-31 MED ORDER — FENTANYL CITRATE (PF) 250 MCG/5ML IJ SOLN
INTRAMUSCULAR | Status: AC
  Filled 2022-10-31: qty 5

## 2022-10-31 MED ORDER — BUPIVACAINE-EPINEPHRINE (PF) 0.5% -1:200000 IJ SOLN
INTRAMUSCULAR | Status: AC
  Filled 2022-10-31: qty 30

## 2022-10-31 MED ORDER — HYDROMORPHONE HCL 1 MG/ML IJ SOLN
0.5000 mg | INTRAMUSCULAR | Status: DC | PRN
  Filled 2022-10-31: qty 1

## 2022-10-31 MED ORDER — LACTATED RINGERS IV SOLN: INTRAVENOUS | Status: DC | PRN

## 2022-10-31 MED ORDER — SODIUM CHLORIDE 0.9 % IV SOLN: INTRAVENOUS | Status: DC

## 2022-10-31 MED ORDER — PHENYLEPHRINE HCL-NACL 20-0.9 MG/250ML-% IV SOLN
INTRAVENOUS | Status: DC | PRN
  Administered 2022-10-31: 15 ug/min via INTRAVENOUS

## 2022-10-31 MED ORDER — SODIUM CHLORIDE (PF) 0.9 % IJ SOLN
INTRAMUSCULAR | Status: DC | PRN
  Administered 2022-10-31: 20 mL via INTRAVENOUS

## 2022-10-31 MED ORDER — DOCUSATE SODIUM 100 MG PO CAPS
100.0000 mg | ORAL_CAPSULE | Freq: Two times a day (BID) | ORAL | Status: DC
  Administered 2022-10-31 – 2022-11-07 (×9): 100 mg via ORAL
  Filled 2022-10-31 (×11): qty 1

## 2022-10-31 MED ORDER — DEXAMETHASONE SODIUM PHOSPHATE 10 MG/ML IJ SOLN
INTRAMUSCULAR | Status: AC
  Filled 2022-10-31: qty 1

## 2022-10-31 MED ORDER — ONDANSETRON HCL 4 MG/2ML IJ SOLN: 4.0000 mg | Freq: Four times a day (QID) | INTRAMUSCULAR | Status: DC | PRN

## 2022-10-31 MED ORDER — KETOROLAC TROMETHAMINE 30 MG/ML IJ SOLN
INTRAMUSCULAR | Status: AC
  Filled 2022-10-31: qty 1

## 2022-10-31 MED ORDER — MENTHOL 3 MG MT LOZG: 1.0000 | LOZENGE | OROMUCOSAL | Status: DC | PRN

## 2022-10-31 MED ORDER — METOCLOPRAMIDE HCL 10 MG PO TABS: 5.0000 mg | ORAL_TABLET | Freq: Three times a day (TID) | ORAL | Status: DC | PRN

## 2022-10-31 MED ORDER — ROCURONIUM BROMIDE 10 MG/ML (PF) SYRINGE
PREFILLED_SYRINGE | INTRAVENOUS | Status: DC | PRN
  Administered 2022-10-31: 40 mg via INTRAVENOUS

## 2022-10-31 MED ORDER — PHENYLEPHRINE 80 MCG/ML (10ML) SYRINGE FOR IV PUSH (FOR BLOOD PRESSURE SUPPORT)
PREFILLED_SYRINGE | INTRAVENOUS | Status: DC | PRN
  Administered 2022-10-31: 80 ug via INTRAVENOUS
  Administered 2022-10-31: 240 ug via INTRAVENOUS

## 2022-10-31 MED ORDER — ONDANSETRON HCL 4 MG PO TABS: 4.0000 mg | ORAL_TABLET | Freq: Four times a day (QID) | ORAL | Status: DC | PRN

## 2022-10-31 MED ORDER — HYDROMORPHONE HCL 2 MG PO TABS
1.0000 mg | ORAL_TABLET | ORAL | Status: DC | PRN
  Administered 2022-11-02: 1 mg via ORAL
  Administered 2022-11-04 – 2022-11-06 (×3): 2 mg via ORAL
  Filled 2022-10-31 (×4): qty 1

## 2022-10-31 MED ORDER — ALBUMIN HUMAN 5 % IV SOLN: INTRAVENOUS | Status: DC | PRN

## 2022-10-31 MED ORDER — CEFAZOLIN SODIUM 1 G IJ SOLR
INTRAMUSCULAR | Status: AC
  Filled 2022-10-31: qty 10

## 2022-10-31 MED ORDER — PROPOFOL 10 MG/ML IV BOLUS
INTRAVENOUS | Status: DC | PRN
  Administered 2022-10-31: 30 mg via INTRAVENOUS
  Administered 2022-10-31: 100 mg via INTRAVENOUS
  Administered 2022-10-31: 30 mg via INTRAVENOUS

## 2022-10-31 MED ORDER — DEXAMETHASONE SODIUM PHOSPHATE 10 MG/ML IJ SOLN
INTRAMUSCULAR | Status: DC | PRN
  Administered 2022-10-31: 10 mg via INTRAVENOUS

## 2022-10-31 MED ORDER — METOCLOPRAMIDE HCL 5 MG/ML IJ SOLN: 5.0000 mg | Freq: Three times a day (TID) | INTRAMUSCULAR | Status: DC | PRN

## 2022-10-31 MED ORDER — DIPHENHYDRAMINE HCL 12.5 MG/5ML PO ELIX: 12.5000 mg | ORAL_SOLUTION | ORAL | Status: DC | PRN

## 2022-10-31 MED ORDER — ROCURONIUM BROMIDE 10 MG/ML (PF) SYRINGE
PREFILLED_SYRINGE | INTRAVENOUS | Status: AC
  Filled 2022-10-31: qty 10

## 2022-10-31 MED ORDER — CHLORHEXIDINE GLUCONATE CLOTH 2 % EX PADS
6.0000 | MEDICATED_PAD | Freq: Every day | CUTANEOUS | Status: DC
  Administered 2022-11-01 – 2022-11-07 (×6): 6 via TOPICAL

## 2022-10-31 MED ORDER — METHOCARBAMOL 500 MG PO TABS
500.0000 mg | ORAL_TABLET | Freq: Four times a day (QID) | ORAL | Status: DC | PRN
  Administered 2022-11-03 – 2022-11-04 (×2): 500 mg via ORAL
  Filled 2022-10-31 (×2): qty 1

## 2022-10-31 MED ORDER — PROPOFOL 10 MG/ML IV BOLUS
INTRAVENOUS | Status: AC
  Filled 2022-10-31: qty 20

## 2022-10-31 MED ORDER — PHENOL 1.4 % MT LIQD: 1.0000 | OROMUCOSAL | Status: DC | PRN

## 2022-10-31 MED ORDER — SUGAMMADEX SODIUM 200 MG/2ML IV SOLN
INTRAVENOUS | Status: DC | PRN
  Administered 2022-10-31: 200 mg via INTRAVENOUS

## 2022-10-31 MED ORDER — ACETAMINOPHEN 500 MG PO TABS
1000.0000 mg | ORAL_TABLET | Freq: Three times a day (TID) | ORAL | Status: AC
  Administered 2022-10-31 – 2022-11-01 (×4): 1000 mg via ORAL
  Filled 2022-10-31 (×4): qty 2

## 2022-10-31 MED ORDER — BISACODYL 10 MG RE SUPP: 10.0000 mg | Freq: Every day | RECTAL | Status: DC | PRN

## 2022-10-31 MED ORDER — SUCCINYLCHOLINE CHLORIDE 200 MG/10ML IV SOSY
PREFILLED_SYRINGE | INTRAVENOUS | Status: DC | PRN
  Administered 2022-10-31: 80 mg via INTRAVENOUS

## 2022-10-31 MED ORDER — 0.9 % SODIUM CHLORIDE (POUR BTL) OPTIME
TOPICAL | Status: DC | PRN
  Administered 2022-10-31: 1000 mL

## 2022-10-31 SURGICAL SUPPLY — 49 items
BAG COUNTER SPONGE SURGICOUNT (BAG) ×1 IMPLANT
BLADE SAW SGTL 18X1.27X75 (BLADE) ×1 IMPLANT
COVER SURGICAL LIGHT HANDLE (MISCELLANEOUS) ×1 IMPLANT
CUP ACETBLR 54 OD PINNACLE (Hips) IMPLANT
DERMABOND ADVANCED .7 DNX12 (GAUZE/BANDAGES/DRESSINGS) ×1 IMPLANT
DRAPE C-ARM 42X72 X-RAY (DRAPES) ×1 IMPLANT
DRAPE IMP U-DRAPE 54X76 (DRAPES) ×1 IMPLANT
DRAPE U-SHAPE 47X51 STRL (DRAPES) ×3 IMPLANT
DRSG AQUACEL AG ADV 3.5X10 (GAUZE/BANDAGES/DRESSINGS) ×1 IMPLANT
DRSG TEGADERM 4X4.75 (GAUZE/BANDAGES/DRESSINGS) ×1 IMPLANT
DURAPREP 26ML APPLICATOR (WOUND CARE) ×1 IMPLANT
ELECT REM PT RETURN 9FT ADLT (ELECTROSURGICAL) ×1
ELECTRODE REM PT RTRN 9FT ADLT (ELECTROSURGICAL) ×1 IMPLANT
FACESHIELD WRAPAROUND (MASK) ×3 IMPLANT
FACESHIELD WRAPAROUND OR TEAM (MASK) ×1 IMPLANT
GAUZE SPONGE 2X2 8PLY STRL LF (GAUZE/BANDAGES/DRESSINGS) ×1 IMPLANT
GLOVE BIOGEL PI IND STRL 7.5 (GLOVE) ×3 IMPLANT
GLOVE BIOGEL PI IND STRL 8 (GLOVE) ×1 IMPLANT
GLOVE ECLIPSE 8.0 STRL XLNG CF (GLOVE) ×2 IMPLANT
GLOVE ORTHO TXT STRL SZ7.5 (GLOVE) ×2 IMPLANT
GLOVE SURG ENC MOIS LTX SZ6 (GLOVE) ×1 IMPLANT
GLOVE SURG UNDER POLY LF SZ6.5 (GLOVE) ×2 IMPLANT
GOWN STRL REUS W/ TWL LRG LVL3 (GOWN DISPOSABLE) ×2 IMPLANT
GOWN STRL REUS W/TWL LRG LVL3 (GOWN DISPOSABLE) ×3
HEAD M SROM 36MM PLUS 1.5 (Hips) IMPLANT
KIT BASIN OR (CUSTOM PROCEDURE TRAY) ×1 IMPLANT
KIT TURNOVER KIT B (KITS) ×1 IMPLANT
LINER NEUTRAL 54X36MM PLUS 4 (Hips) IMPLANT
MANIFOLD NEPTUNE II (INSTRUMENTS) ×1 IMPLANT
NDL SPNL 18GX3.5 QUINCKE PK (NEEDLE) IMPLANT
NEEDLE SPNL 18GX3.5 QUINCKE PK (NEEDLE) ×1 IMPLANT
NS IRRIG 1000ML POUR BTL (IV SOLUTION) ×1 IMPLANT
PACK TOTAL JOINT (CUSTOM PROCEDURE TRAY) ×1 IMPLANT
PACK UNIVERSAL I (CUSTOM PROCEDURE TRAY) ×1 IMPLANT
PAD ARMBOARD 7.5X6 YLW CONV (MISCELLANEOUS) ×2 IMPLANT
SCREW 6.5MMX30MM (Screw) IMPLANT
SPONGE T-LAP 4X18 ~~LOC~~+RFID (SPONGE) IMPLANT
SROM M HEAD 36MM PLUS 1.5 (Hips) ×1 IMPLANT
STAPLER VISISTAT 35W (STAPLE) ×1 IMPLANT
STEM FEM ACTIS HIGH SZ7 (Stem) IMPLANT
SUT MNCRL AB 4-0 PS2 18 (SUTURE) ×1 IMPLANT
SUT VIC AB 1 CT1 27 (SUTURE) ×4
SUT VIC AB 1 CT1 27XBRD ANBCTR (SUTURE) ×4 IMPLANT
SUT VIC AB 2-0 CT1 27 (SUTURE) ×2
SUT VIC AB 2-0 CT1 TAPERPNT 27 (SUTURE) ×2 IMPLANT
SUT VLOC 180 0 24IN GS25 (SUTURE) ×1 IMPLANT
TOWEL GREEN STERILE (TOWEL DISPOSABLE) ×1 IMPLANT
TUBE SUCT ARGYLE STRL (TUBING) IMPLANT
WATER STERILE IRR 1000ML POUR (IV SOLUTION) ×3 IMPLANT

## 2022-10-31 NOTE — Progress Notes (Addendum)
[facility] PROGRESS NOTE   [name] WPY:[id number] DOB: Aug 08, 1934 DOA: 10/30/2022  PCP: Pleas Koch, NP  Brief History/Interval Summary: 89 y.o. female with medical history significant for Alzheimer's dementia, hypertension, hyperlipidemia, asthma, CAD status post CABG, chronic HFpEF, stroke, hypothyroidism, CKD stage IIIa, LBBB, history of aortic valve replacement, mitral valve prolapse/mitral regurgitation, paroxysmal atrial tachycardia/A-fib not on anticoagulation per cardiology due to extremely low burden of arrhythmia, has a pacemaker, AAA, PAD.  Recently admitted 12/28-12/29 for acute metabolic encephalopathy secondary to COVID-19 infection.  Patient was admitted due to left lower extremity weakness and concern for CVA but neurology felt this was less likely.  CT head negative for acute finding.  Brain MRI could not be done due to presence of a pacemaker.  Chest x-ray was showing an infiltrate and she was treated with Paxlovid.  She was not hypoxic. Patient presented to [facility] ED again for evaluation of left hip pain, recurrent left-sided weakness, and expressive aphasia.  Found to have a left hip fracture. ED physician spoke to Dr. Quinn Axe with neurology who recommended brain MRI for further evaluation could not be done at [place] patient has a pacemaker.  Case was discussed with radiology and ED physician was told that MRI could be done at [facility].  ED physician consulted Dr. Tamera Punt with orthopedics who agrees to see the patient in consultation at [facility].  Patient was transferred to [facility] for further evaluation.   Consultants: Cardiology.  Orthopedics.  Neurology  Procedures:  Echocardiogram is pending. Left total hip replacement using anterior approach, 1/7    Subjective/Interval History: Patient seen postoperatively.  Patient awake alert.  Denies any pain or discomfort.  No shortness of breath or chest  pain.    Assessment/Plan:  Recurrent strokelike symptoms Patient presented with left-sided weakness facial droop and expressive aphasia CT head was negative for acute findings.  Patient had recent hospitalization for similar complaints.  She was admitted to [facility].  She was seen by neurology at that time.  They felt that her presentation was most likely secondary to exacerbation of pre-existing deficits due to COVID infection.  Stroke thought to be less likely. Due to recurrent symptoms patient was thought to require MRI.  She has a pacemaker.  She was transferred to [facility].  Informed by radiology team that the pacemaker is unsafe for MRI. Patient seen by neurology.  EEG was ordered which is negative for epileptiform activity.  Neurology does not recommend any further investigations. Patient noted to be on aspirin and statin. LDL was 76 on December 29.  HbA1c 6.0 on December 29. No recent echocardiogram in our system.  Last 1 was from 2020 which showed normal systolic function.  No significant valvular abnormalities were noted. Echocardiogram has been ordered and is pending.  Left hip fracture Seen by orthopedics.  Underwent surgery this morning.  PT and OT per orthopedics.  Unspecified arrhythmia Pacemaker interrogation does not reveal any evidence for atrial fibrillation.  She does have episodes of atrial tachycardia PACs and PVCs.  No indication for anticoagulation per cardiology.  Monitor on telemetry for now.  Pacemaker in situ Stable.  Pacemaker interrogated.  Coronary artery disease status post CABG Stable from a cardiac standpoint otherwise.  Continue aspirin and statin.  Hypokalemia Potassium level has improved.  Will recheck labs tomorrow.  Recent COVID-19 infection Looks like she completed course of Paxlovid.  No respiratory symptoms currently.  She was tested on 12/28.  Day 1 will be 12/29.  Day 10 will be 1/7.  Can come off of isolation on 1/8.  Chronic's diastolic  CHF Stable.  Euvolemic.  History of Alzheimer's dementia Stable.  Essential hypertension Stable.  Monitor blood pressures closely.  Hypothyroidism Levothyroxine can be resumed.  DVT Prophylaxis: SCDs for now Code Status: Patient was DNR at [facility]. Changed to Full code here for unclear reason. Family requesting change back to DNR. Family Communication: No family at bedside Disposition Plan: To be determined  Status is: Inpatient Remains inpatient appropriate because: Left hip fracture    Medications: Scheduled:  atorvastatin  40 mg Oral Daily   brimonidine  1 drop Left Eye BID   Chlorhexidine Gluconate Cloth  6 each Topical Daily   levothyroxine  50 mcg Oral Q0600   Continuous: PRN:  Antibiotics: Anti-infectives (From admission, onward)    Start     Dose/Rate Route Frequency Ordered Stop   10/31/22 0600  ceFAZolin (ANCEF) IVPB 2g/100 mL premix        2 g 200 mL/hr over 30 Minutes Intravenous On call to O.R. 11-28-2022 1832 10/31/22 0725       Objective:  Vital Signs  Vitals:   11-28-2022 2021 10/31/22 0023 10/31/22 0414 10/31/22 0955  BP: (!) 168/87 129/64 (!) 146/81 (!) 154/77  Pulse: 81 73 80 84  Resp: 17 17 17 14   Temp: 98.5 F (36.9 C) 97.8 F (36.6 C) 98.3 F (36.8 C) 97.6 F (36.4 C)  TempSrc: Oral Oral Oral   SpO2: 97% 93% 97% 94%    Intake/Output Summary (Last 24 hours) at 10/31/2022 1034 Last data filed at 10/31/2022 0950 Gross per 24 hour  Intake 1293.95 ml  Output 1650 ml  Net -356.05 ml   There were no vitals filed for this visit.  General appearance: Awake alert.  In no distress Resp: Clear to auscultation bilaterally.  Normal effort Cardio: S1-S2 is normal regular.  No S3-S4.  No rubs murmurs or bruit GI: Abdomen is soft.  Nontender nondistended.  Bowel sounds are present normal.  No masses organomegaly Extremities: Dressing noted over the left lower extremity   Lab Results:  Data Reviewed: I have personally reviewed following labs and  reports of the imaging studies  CBC: Recent Labs  Lab 10/29/22 1436 28-Nov-2022 1108  WBC 13.2* 7.6  NEUTROABS 11.8*  --   HGB 15.0 12.9  HCT 43.4 36.6  MCV 88.6 89.1  PLT 240 181     Basic Metabolic Panel: Recent Labs  Lab 10/29/22 1436 11-28-22 1108  NA 135 134*  K 2.9* 3.4*  CL 95* 99  CO2 25 23  GLUCOSE 136* 148*  BUN 15 12  CREATININE 0.99 1.07*  CALCIUM 9.1 8.0*  MG  --  1.7     GFR: Estimated Creatinine Clearance: 39.4 mL/min (A) (by C-G formula based on SCr of 1.07 mg/dL (H)).  Liver Function Tests: Recent Labs  Lab 10/29/22 1436 11/28/2022 1108  AST 44* 33  ALT 46* 44  ALKPHOS 80 62  BILITOT 1.4* 1.1  PROT 7.6 5.9*  ALBUMIN 4.3 3.2*     Recent Labs  Lab 10/29/22 1436  LIPASE 30     Coagulation Profile: Recent Labs  Lab 10/29/22 1436  INR 1.0      No results found for this or any previous visit (from the past 240 hour(s)).     Radiology Studies: EEG adult  Result Date: 11/28/22 12/29/2022, MD     11/28/22  6:44 PM Routine EEG Report [place]  LAKISA LOTZ is a 89 y.o. female with a history of altered mental status who is undergoing an EEG to evaluate for seizures. Report: This EEG was acquired with electrodes placed according to the International 10-20 electrode system (including Fp1, Fp2, F3, F4, C3, C4, P3, P4, O1, O2, T3, T4, T5, T6, A1, A2, Fz, Cz, Pz). The following electrodes were missing or displaced: none. The occipital dominant rhythm was 5-7 Hz. This activity is reactive to stimulation. Drowsiness was manifested by background fragmentation; deeper stages of sleep were identified by K complexes and sleep spindles. There was no focal slowing. There were no interictal epileptiform discharges. There were no electrographic seizures identified. Photic stimulation and hyperventilation were not performed. Impression and clinical correlation: This EEG was obtained while awake and asleep and is abnormal due to mild diffuse slowing  indicative of global cerebral dysfunction. Epileptiform abnormalities were not seen during this recording. Bing Neighbors, MD [facility] [phone] If 7pm- 7am, please page neurology on call as listed in AMION.   DG Hip Unilat W or Wo Pelvis 2-3 Views Left  Result Date: 10/29/2022 CLINICAL DATA:  Left hip pain after fall EXAM: DG HIP (WITH OR WITHOUT PELVIS) 2-3V LEFT COMPARISON:  Left hip x-ray 10/13/2022 FINDINGS: There is an acute left femoral neck fracture with mild superior displacement of the distal fracture fragment. There is no dislocation. The bones are osteopenic. Right hip arthroplasty appears in anatomic alignment. IMPRESSION: Acute left femoral neck fracture. Electronically Signed   By: Darliss Cheney M.D.   On: 10/29/2022 15:38   DG Chest Portable 1 View  Result Date: 10/29/2022 CLINICAL DATA:  Left hip pain after fall EXAM: PORTABLE CHEST 1 VIEW COMPARISON:  10/21/2022 FINDINGS: Single frontal view of the chest demonstrates stable postsurgical changes from median sternotomy and aortic valve replacement. Dual lead pacemaker unchanged. Cardiac silhouette is stable. No airspace disease, effusion, or pneumothorax. No acute displaced fracture. Stable hiatal hernia. IMPRESSION: 1. No acute intrathoracic process. Electronically Signed   By: Sharlet Salina M.D.   On: 10/29/2022 15:29   CT HEAD WO CONTRAST ( )  Result Date: 10/29/2022 CLINICAL DATA:  Trauma. EXAM: CT HEAD WITHOUT CONTRAST TECHNIQUE: Contiguous axial images were obtained from the base of the skull through the vertex without intravenous contrast. RADIATION DOSE REDUCTION: This exam was performed according to the departmental dose-optimization program which includes automated exposure control, adjustment of the mA and/or kV according to patient size and/or use of iterative reconstruction technique. COMPARISON:  Head CT 10/20/2022.  MRI brain 04/10/2018. FINDINGS: Brain: No evidence of acute infarction, hemorrhage,  hydrocephalus, extra-axial collection or mass lesion/mass effect. There is mild diffuse atrophy. There is stable moderate periventricular white matter hypodensity, likely chronic small vessel ischemic change. Vascular: Atherosclerotic calcifications are present within the cavernous internal carotid arteries. Skull: Normal. Negative for fracture or focal lesion. Sinuses/Orbits: No acute finding. Other: None. IMPRESSION: 1. No acute intracranial process. 2. Stable atrophy and chronic small vessel ischemic changes. Electronically Signed   By: Darliss Cheney M.D.   On: 10/29/2022 15:15   CT Cervical Spine Wo Contrast  Result Date: 10/29/2022 CLINICAL DATA:  Neck trauma (Age >= 65y) EXAM: CT CERVICAL SPINE WITHOUT CONTRAST TECHNIQUE: Multidetector CT imaging of the cervical spine was performed without intravenous contrast. Multiplanar CT image reconstructions were also generated. RADIATION DOSE REDUCTION: This exam was performed according to the departmental dose-optimization program which includes automated exposure control, adjustment of the mA and/or kV according to patient size and/or use of iterative reconstruction technique.  COMPARISON:  CT cervical spine 11/14/2021. FINDINGS: Alignment: Mild anterolisthesis of C2 on C3 is unchanged. No new sagittal subluxation. Skull base and vertebrae: No evidence of acute fracture. Soft tissues and spinal canal: No prevertebral fluid or swelling. No visible canal hematoma. Disc levels: Similar multilevel degenerative change. Upper chest: Biapical pleuroparenchymal scarring. IMPRESSION: No evidence of acute fracture or traumatic malalignment. Electronically Signed   By: Feliberto Harts M.D.   On: 10/29/2022 15:15       LOS: 1 day   Tujuana Kilmartin  [facility] Pager on [email or web address]  10/31/2022, 10:34 AM

## 2022-10-31 NOTE — Plan of Care (Signed)
  Problem: Education: Goal: Knowledge of General Education information will improve Description: Including pain rating scale, medication(s)/side effects and non-pharmacologic comfort measures Outcome: Progressing   Problem: Health Behavior/Discharge Planning: Goal: Ability to manage health-related needs will improve Outcome: Progressing   Problem: Clinical Measurements: Goal: Ability to maintain clinical measurements within normal limits will improve Outcome: Progressing Goal: Will remain free from infection Outcome: Progressing Goal: Diagnostic test results will improve Outcome: Progressing Goal: Respiratory complications will improve Outcome: Progressing Goal: Cardiovascular complication will be avoided Outcome: Progressing   Problem: Activity: Goal: Risk for activity intolerance will decrease Outcome: Progressing   Problem: Nutrition: Goal: Adequate nutrition will be maintained Outcome: Progressing   Problem: Coping: Goal: Level of anxiety will decrease Outcome: Progressing   Problem: Elimination: Goal: Will not experience complications related to bowel motility Outcome: Progressing Goal: Will not experience complications related to urinary retention Outcome: Progressing   Problem: Pain Managment: Goal: General experience of comfort will improve Outcome: Progressing   Problem: Safety: Goal: Ability to remain free from injury will improve Outcome: Progressing   Problem: Skin Integrity: Goal: Risk for impaired skin integrity will decrease Outcome: Progressing   Problem: Education: Goal: Knowledge of risk factors and measures for prevention of condition will improve Outcome: Progressing   Problem: Coping: Goal: Psychosocial and spiritual needs will be supported Outcome: Progressing   Problem: Respiratory: Goal: Will maintain a patent airway Outcome: Progressing Goal: Complications related to the disease process, condition or treatment will be avoided or  minimized Outcome: Progressing   Problem: Education: Goal: Knowledge of disease or condition will improve Outcome: Progressing Goal: Knowledge of secondary prevention will improve (MUST DOCUMENT ALL) Outcome: Progressing Goal: Knowledge of patient specific risk factors will improve (Mark N/A or DELETE if not current risk factor) Outcome: Progressing   Problem: Ischemic Stroke/TIA Tissue Perfusion: Goal: Complications of ischemic stroke/TIA will be minimized Outcome: Progressing   Problem: Coping: Goal: Will verbalize positive feelings about self Outcome: Progressing Goal: Will identify appropriate support needs Outcome: Progressing   Problem: Health Behavior/Discharge Planning: Goal: Ability to manage health-related needs will improve Outcome: Progressing Goal: Goals will be collaboratively established with patient/family Outcome: Progressing   Problem: Self-Care: Goal: Ability to participate in self-care as condition permits will improve Outcome: Progressing Goal: Verbalization of feelings and concerns over difficulty with self-care will improve Outcome: Progressing Goal: Ability to communicate needs accurately will improve Outcome: Progressing   Problem: Nutrition: Goal: Risk of aspiration will decrease Outcome: Progressing Goal: Dietary intake will improve Outcome: Progressing   Problem: Education: Goal: Knowledge of the prescribed therapeutic regimen will improve Outcome: Progressing Goal: Understanding of discharge needs will improve Outcome: Progressing Goal: Individualized Educational Video(s) Outcome: Progressing   Problem: Activity: Goal: Ability to avoid complications of mobility impairment will improve Outcome: Progressing Goal: Ability to tolerate increased activity will improve Outcome: Progressing   Problem: Clinical Measurements: Goal: Postoperative complications will be avoided or minimized Outcome: Progressing   Problem: Pain  Management: Goal: Pain level will decrease with appropriate interventions Outcome: Progressing   Problem: Skin Integrity: Goal: Will show signs of wound healing Outcome: Progressing   

## 2022-10-31 NOTE — Consult Note (Signed)
Reason for Consult:left hip fracture Referring Physician: Rito Ehrlich, MD  [name] is an 89 y.o. female.  HPI: [name] is a 89 y.o. female with medical history significant of Alzheimer's dementia, hypertension, hyperlipidemia, asthma, CAD status post CABG, chronic HFpEF, stroke, hypothyroidism, CKD stage IIIa, LBBB, history of aortic valve replacement, mitral valve prolapse/mitral regurgitation, paroxysmal atrial tachycardia/A-fib not on anticoagulation per cardiology due to extremely low burden of arrhythmia, has a pacemaker, AAA, PAD. Patient presented to [facility] ED yesterday for evaluation of left hip pain, recurrent left-sided weakness, and expressive aphasia. She had 2 falls in the last 24 hours.  X-ray of left hip/pelvis showing acute left femoral neck fracture. Patient has dementia and is not able to give any history.  History provided by her daughter and granddaughter at bedside.  She has been increasingly more confused.  They were told that patient would need an MRI to assess for possible stroke.   History of right hip conversion to THR following failed perc screws - 2019  Past Medical History:  Diagnosis Date   AAA (abdominal aortic aneurysm) (HCC) 01/03/13   3.5x3.3cm   Alzheimer's dementia (HCC)    "probably middle stage" (11/05/2014)   Anemia    "hx of chronic" (11/05/2014)   Aortic stenosis 03/29/2008   biological prosthetic replacement   Arthritis    "joints" (11/05/2014)   Asthma    Avascular necrosis of bone of hip, right (HCC) 07/28/2018   Chronic asthmatic bronchitis    "q fall and winter" (11/05/2014)   Closed nondisplaced fracture of middle phalanx of lesser toe of right foot 01/05/2021   Coronary artery disease    CABG 03/29/08   Dyslipidemia    Exertional shortness of breath    Family history of adverse reaction to anesthesia    "daughter gets PONV too"   Heart murmur    Hypertension    Hypothyroidism    LBBB (left bundle branch block)    Migraines    "none  in years' (11/05/2014)   Mobitz type 2 second degree AV block 10/31/2014   MVP (mitral valve prolapse)    with mild mitral insufficiency/notes 08/17/2013   Near syncope 08/17/2013   PONV (postoperative nausea and vomiting)    Presence of permanent cardiac pacemaker    Sinus pause 10/31/2014   Stroke (HCC) 03/2008   "2 light strokes"; denies residual on 11/05/2014   Urinary frequency     Past Surgical History:  Procedure Laterality Date   ABDOMINAL HYSTERECTOMY  1977   AORTIC VALVE REPLACEMENT  03/29/2008   PERICARDIAL TISSUE VALVE   APPENDECTOMY  ?1977   CARDIAC CATHETERIZATION  ~ 2009   CATARACT EXTRACTION W/ INTRAOCULAR LENS IMPLANT Left    CORONARY ARTERY BYPASS GRAFT  03/29/2008   LIMA TO LAD,SVG TO CX,SVG TO LEFT POSTEROLATERAL BRANCH   HIP FRACTURE SURGERY Right 06/2010   "3 metal screws"    INSERT / REPLACE / REMOVE PACEMAKER  11/05/2014   LOOP RECORDER EXPLANT N/A 11/05/2014   Procedure: LOOP RECORDER EXPLANT;  Surgeon: [name], MD;  Location: [facility] CATH [facility];  Service: Cardiovascular;  Laterality: N/A;   LOOP RECORDER IMPLANT N/A 09/17/2013   Procedure: LOOP RECORDER IMPLANT;  Surgeon: [name], MD;  Location: [facility] CATH [facility];  Service: Cardiovascular;  Laterality: N/A;   PERMANENT PACEMAKER INSERTION N/A 11/05/2014   Procedure: PERMANENT PACEMAKER INSERTION;  Surgeon: [name], MD;  Location: [facility] CATH [facility];  Service: Cardiovascular;  Laterality: N/A;   REFRACTIVE SURGERY Bilateral    /  notes 04/28/2008 (08/17/2013)   RETINAL DETACHMENT SURGERY Left 1994   Archie Endo 04/10/2008 (08/17/2013)   TIBIAL TUBERCLERPLASTY  11/05/2014   tooth pulled   06/2018   TOTAL HIP ARTHROPLASTY Right 07/31/2018   Procedure: RIGHT TOTAL HIP ARTHROPLASTY ANTERIOR APPROACH;  Surgeon: Frederik Pear, MD;  Location: [facility] ORS;  Service: Orthopedics;  Laterality: Right;   UTERINE FIBROID SURGERY  1960's    Family History  Problem Relation Age of Onset   Heart failure Mother    Diabetes Mother     Hypertension Mother    Hyperlipidemia Mother    Melanoma Grandchild    Colon cancer Neg Hx     Social History:  reports that she has never smoked. She has never used smokeless tobacco. She reports that she does not drink alcohol and does not use drugs.  Allergies:  Allergies  Allergen Reactions   Codeine Nausea And Vomiting and Other (See Comments)    Patient gets violently ill   Morphine And Related Nausea And Vomiting and Other (See Comments)    Patient get violently ill    Medications: I have reviewed the patient's current medications. Scheduled:  aspirin EC  81 mg Oral Daily   atorvastatin  40 mg Oral Daily   brimonidine  1 drop Left Eye BID   chlorhexidine  60 mL Topical Once   Chlorhexidine Gluconate Cloth  6 each Topical Daily   levothyroxine  50 mcg Oral Q0600   povidone-iodine  2 Application Topical Once    Results for orders placed or performed during the hospital encounter of 10/30/22 (from the past 24 hour(s))  CBC     Status: None   Collection Time: 10/30/22 11:08 AM  Result Value Ref Range   WBC 7.6 4.0 - 10.5 K/uL   RBC 4.11 3.87 - 5.11 MIL/uL   Hemoglobin 12.9 12.0 - 15.0 g/dL   HCT 36.6 36.0 - 46.0 %   MCV 89.1 80.0 - 100.0 fL   MCH 31.4 26.0 - 34.0 pg   MCHC 35.2 30.0 - 36.0 g/dL   RDW 12.9 11.5 - 15.5 %   Platelets 181 150 - 400 K/uL   nRBC 0.0 0.0 - 0.2 %  Comprehensive metabolic panel     Status: Abnormal   Collection Time: 10/30/22 11:08 AM  Result Value Ref Range   Sodium 134 (L) 135 - 145 mmol/L   Potassium 3.4 (L) 3.5 - 5.1 mmol/L   Chloride 99 98 - 111 mmol/L   CO2 23 22 - 32 mmol/L   Glucose, Bld 148 (H) 70 - 99 mg/dL   BUN 12 8 - 23 mg/dL   Creatinine, Ser 1.07 (H) 0.44 - 1.00 mg/dL   Calcium 8.0 (L) 8.9 - 10.3 mg/dL   Total Protein 5.9 (L) 6.5 - 8.1 g/dL   Albumin 3.2 (L) 3.5 - 5.0 g/dL   AST 33 15 - 41 U/L   ALT 44 0 - 44 U/L   Alkaline Phosphatase 62 38 - 126 U/L   Total Bilirubin 1.1 0.3 - 1.2 mg/dL   GFR, Estimated 51 (L) >60  mL/min   Anion gap 12 5 - 15  Magnesium     Status: None   Collection Time: 10/30/22 11:08 AM  Result Value Ref Range   Magnesium 1.7 1.7 - 2.4 mg/dL     X-ray: CLINICAL DATA:  Left hip pain after fall   EXAM: DG HIP (WITH OR WITHOUT PELVIS) 2-3V LEFT   COMPARISON:  Left hip x-ray 10/13/2022  FINDINGS: There is an acute left femoral neck fracture with mild superior displacement of the distal fracture fragment. There is no dislocation. The bones are osteopenic. Right hip arthroplasty appears in anatomic alignment.   IMPRESSION: Acute left femoral neck fracture.     Electronically Signed   By: Ronney Asters M.D.  ROS: Constitutional:  Negative for chills and fever.  Respiratory:  Negative for shortness of breath.   Cardiovascular:  Negative for chest pain.  Musculoskeletal:  Positive for arthralgias.  Psychiatric/Behavioral:  Positive for confusion.    Blood pressure (!) 146/81, pulse 80, temperature 98.3 F (36.8 C), temperature source Oral, resp. rate 17, SpO2 97 %.  Physical Exam: Constitutional:      General: She is not in acute distress.    Appearance: Normal appearance. She is not ill-appearing.  HENT:     Head: Normocephalic and atraumatic.  Eyes:     Extraocular Movements: Extraocular movements intact.     Pupils: Pupils are equal, round, and reactive to light.  Cardiovascular:     Rate and Rhythm: Tachycardia present. Rhythm irregular.     Pulses: Normal pulses.  Pulmonary:     Effort: Pulmonary effort is normal.  Musculoskeletal:     Comments: Left lower extremity shortened and externally rotated. She has tenderness to palpation about the left lateral hip. No hematoma, open wound, or abrasion. Distally neurovascularly intact. Calves soft and nontender.   Neurological:     Mental Status: She is alert. Mental status is at baseline.   Assessment/Plan: Displaced hip femoral neck fracture  Plan: NPO To OR today for left THR (History of right THR  for failed screw fixation of a right femoral neck fracture) Post op course dependent on pre-op condition and functional capabilities Post op orders to follow procedure  Mauri Pole 10/31/2022, 7:18 AM

## 2022-10-31 NOTE — Op Note (Signed)
NAME:  [name]                ACCOUNT NO.: [id number]      MEDICAL RECORD NO.: [id number]      FACILITY:  [facility]      PHYSICIAN:  Mauri Pole  DATE OF BIRTH:  1934-04-12     DATE OF PROCEDURE:  10/31/2022                                 OPERATIVE REPORT         PREOPERATIVE DIAGNOSIS: Left  hip displaced femoral neck fracture      POSTOPERATIVE DIAGNOSIS:  Left hip displaced femoral neck fracture     PROCEDURE:  Left total hip replacement through an anterior approach   utilizing DePuy THR system, component size 54 mm pinnacle cup, a size 36+4 neutral   Altrex liner, a size 7 Hi Actis stem with a 36+1.5 Articuleze metal head ball.      SURGEON:  Pietro Cassis. Alvan Dame, M.D.      ASSISTANT:  Costella Hatcher, PA-C     ANESTHESIA:  General.      SPECIMENS:  None.      COMPLICATIONS:  None.      BLOOD LOSS:  250 cc     DRAINS:  None.      INDICATION OF THE PROCEDURE:  [name] is a 89 y.o. female with significant medical comorbidities including dementia as well as a history of conversion of failed right hip surgery to right total hip arthroplasty presented to the emergency room after a ground-level fall with inability to bear weight.  Radiographic assessment in the emergency room indicated a displaced left femoral neck fracture.  Orthopedics was consulted for definitive management.  Indications for the surgery were reviewed.  Plan is to proceed with total hip arthroplasty as opposed to hemiarthroplasty or percutaneous screw fixation reviewed with family.  Risks of infection DVT dislocation neurovascular injury noted.  Postoperative course reviewed.     PROCEDURE IN DETAIL:  The patient was brought to operative theater.   Once adequate anesthesia, preoperative antibiotics, 2 gm of Ancef, 1 gm of Tranexamic Acid, and 10 mg of Decadron were administered, the patient was positioned supine on the [employer] table.  Once the patient was safely positioned  with adequate padding of boney prominences we predraped out the hip, and used fluoroscopy to confirm orientation of the pelvis.      The left hip was then prepped and draped from proximal iliac crest to   mid thigh with a shower curtain technique.      Time-out was performed identifying the patient, planned procedure, and the appropriate extremity.     An incision was then made 2 cm lateral to the   anterior superior iliac spine extending over the orientation of the   tensor fascia lata muscle and sharp dissection was carried down to the   fascia of the muscle.      The fascia was then incised.  The muscle belly was identified and swept   laterally and retractor placed along the superior neck.  Following   cauterization of the circumflex vessels and removing some pericapsular   fat, a second cobra retractor was placed on the inferior neck.  A T-capsulotomy was made along the line of the   superior neck to the trochanteric fossa, then extended proximally and  distally.  Tag sutures were placed and the retractors were then placed   intracapsular.  We then identified the trochanteric fossa and   orientation of my neck cut and then made a neck osteotomy with the femur on traction.  The fractured femoral neck segment as well as the femoral head were then removed without difficulty or complication.  Traction was let   off and retractors were placed posterior and anterior around the   acetabulum.      The labrum and foveal tissue were debrided.  I began reaming with a 47 mm   reamer and reamed up to 53 mm reamer with good bony bed preparation and a 54 mm  cup was chosen.  The final 54 mm Pinnacle cup was then impacted under fluoroscopy to confirm the depth of penetration and orientation with respect to   Abduction and forward flexion.  A screw was placed into the ilium followed by the hole eliminator.  The final   36+4 neutral Altrex liner was impacted with good visualized rim fit.  The cup was  positioned anatomically within the acetabular portion of the pelvis.      At this point, the femur was rolled to 100 degrees.  Further capsule was   released off the inferior aspect of the femoral neck.  I then   released the superior capsule proximally.  With the leg in a neutral position the hook was placed laterally   along the femur under the vastus lateralis origin and elevated manually and then held in position using the hook attachment on the bed.  The leg was then extended and adducted with the leg rolled to 100   degrees of external rotation.  Retractors were placed along the medial calcar and posteriorly over the greater trochanter.  Once the proximal femur was fully   exposed, I used a box osteotome to set orientation.  I then began   broaching with the starting chili pepper broach and passed this by hand and then broached up to 7.  With the 7 broach in place I chose a high offset neck and did several trial reductions.  The offset was appropriate, leg lengths   appeared to be equal best matched with the +1.5 head ball trial confirmed radiographically.   Given these findings, I went ahead and dislocated the hip, repositioned all   retractors and positioned the right hip in the extended and abducted position.  The final 7 Hi Actis stem was   chosen and it was impacted down to the level of neck cut.  Based on this   and the trial reductions, a final 36+1.5 Articuleze metal head ball was chosen and   impacted onto a clean and dry trunnion, and the hip was reduced.  The   hip had been irrigated throughout the case again at this point.  I did   reapproximate the superior capsular leaflet to the anterior leaflet   using #1 Vicryl.  The fascia of the   tensor fascia lata muscle was then reapproximated using #1 Vicryl and #0 Stratafix sutures.  The   remaining wound was closed with 2-0 Vicryl and running 4-0 Monocryl.   The hip was cleaned, dried, and dressed sterilely using Dermabond and    Aquacel dressing.  The patient was then brought   to recovery room in stable condition tolerating the procedure well.    Rosalene Billings, PA-C was present for the entirety of the case involved from   preoperative positioning, perioperative  retractor management, general   facilitation of the case, as well as primary wound closure as assistant.            Madlyn Frankel Charlann Boxer, M.D.        10/31/2022 9:27 AM

## 2022-10-31 NOTE — Anesthesia Procedure Notes (Signed)
Procedure Name: Intubation Date/Time: 10/31/2022 8:22 AM  Performed by: Kyung Rudd, CRNAPre-anesthesia Checklist: Patient identified, Emergency Drugs available, Suction available and Patient being monitored Patient Re-evaluated:Patient Re-evaluated prior to induction Preoxygenation: Pre-oxygenation with 100% oxygen Induction Type: IV induction and Rapid sequence Laryngoscope Size: Mac and 3 Grade View: Grade II Tube type: Oral Tube size: 7.0 mm Number of attempts: 1 Airway Equipment and Method: Stylet Placement Confirmation: ETT inserted through vocal cords under direct vision, positive ETCO2 and breath sounds checked- equal and bilateral Secured at: 21 cm Tube secured with: Tape Dental Injury: Teeth and Oropharynx as per pre-operative assessment

## 2022-10-31 NOTE — Plan of Care (Signed)
  Problem: Clinical Measurements: Goal: Will remain free from infection Outcome: Progressing Goal: Cardiovascular complication will be avoided Outcome: Progressing   Problem: Coping: Goal: Level of anxiety will decrease Outcome: Progressing   

## 2022-10-31 NOTE — Discharge Instructions (Signed)

## 2022-10-31 NOTE — Anesthesia Postprocedure Evaluation (Signed)
Anesthesia Post Note  Patient: Sanaia S Potocki  Procedure(s) Performed: TOTAL HIP ARTHROPLASTY ANTERIOR APPROACH (Left: Hip)     Patient location during evaluation: PACU Anesthesia Type: General Level of consciousness: awake Pain management: pain level controlled Vital Signs Assessment: post-procedure vital signs reviewed and stable Respiratory status: spontaneous breathing, nonlabored ventilation and respiratory function stable Cardiovascular status: blood pressure returned to baseline and stable Postop Assessment: no apparent nausea or vomiting Anesthetic complications: no   No notable events documented.  Last Vitals:  Vitals:   10/31/22 1030 10/31/22 1100  BP: (!) 162/77 (!) 146/74  Pulse: 76 81  Resp: 11 16  Temp: (!) 36.3 C 36.7 C  SpO2: 100% 100%    Last Pain:  Vitals:   10/31/22 1100  TempSrc: Oral  PainSc:                  Belinda Bringhurst P Aimi Essner

## 2022-10-31 NOTE — Plan of Care (Cosign Needed)
EEG performed 1/6 demonstrates no epileptiform abnormalities, and head CT showed no evidence of significant infarct.  Patient's speech disturbances are likely due to underlying dementia rather than stroke or seizure.  Neurology will sign off, please contact us with any further questions or concerns.  [place] , MSN, AGACNP-BC [facility] See Amion for schedule and pager information 10/31/2022 8:03 AM

## 2022-10-31 NOTE — Anesthesia Preprocedure Evaluation (Addendum)
Anesthesia Evaluation  Patient identified by MRN, date of birth, ID band Patient confused    Reviewed: Allergy & Precautions, NPO status , Patient's Chart, lab work & pertinent test results  History of Anesthesia Complications (+) PONV and history of anesthetic complications  Airway Mallampati: II  TM Distance: >3 FB Neck ROM: Full    Dental no notable dental hx.    Pulmonary neg pulmonary ROS   Pulmonary exam normal        Cardiovascular hypertension, Pt. on medications + CAD and + CABG  Normal cardiovascular exam+ pacemaker + Valvular Problems/Murmurs (s/p AVR) AS      Neuro/Psych  Headaches PSYCHIATRIC DISORDERS  Depression   Dementia CVA    GI/Hepatic negative GI ROS, Neg liver ROS,,,  Endo/Other  Hypothyroidism    Renal/GU negative Renal ROS     Musculoskeletal  (+) Arthritis ,    Abdominal   Peds  Hematology negative hematology ROS (+)   Anesthesia Other Findings LEFT hip fracture COVID positive  Reproductive/Obstetrics                             Anesthesia Physical Anesthesia Plan  ASA: 4  Anesthesia Plan: Spinal   Post-op Pain Management:    Induction: Intravenous  PONV Risk Score and Plan: 3 and Ondansetron, Dexamethasone, Propofol infusion and Treatment may vary due to age or medical condition  Airway Management Planned: Simple Face Mask  Additional Equipment:   Intra-op Plan:   Post-operative Plan:   Informed Consent: I have reviewed the patients History and Physical, chart, labs and discussed the procedure including the risks, benefits and alternatives for the proposed anesthesia with the patient or authorized representative who has indicated his/her understanding and acceptance.     Consent reviewed with POA  Plan Discussed with: CRNA  Anesthesia Plan Comments:        Anesthesia Quick Evaluation

## 2022-10-31 NOTE — Transfer of Care (Signed)
Immediate Anesthesia Transfer of Care Note  Patient: Anber S Mule  Procedure(s) Performed: TOTAL HIP ARTHROPLASTY ANTERIOR APPROACH (Left: Hip)  Patient Location: PACU  Anesthesia Type:General  Level of Consciousness: awake  Airway & Oxygen Therapy: Patient Spontanous Breathing  Post-op Assessment: Report given to RN, Post -op Vital signs reviewed and stable, and Patient moving all extremities  Post vital signs: Reviewed and stable  Last Vitals:  Vitals Value Taken Time  BP 154/77 10/31/22 0954  Temp    Pulse 81 10/31/22 1000  Resp 15 10/31/22 1000  SpO2 93 % 10/31/22 1000  Vitals shown include unvalidated device data.  Last Pain:  Vitals:   10/31/22 0455  TempSrc:   PainSc: 4          Complications: No notable events documented.

## 2022-11-01 ENCOUNTER — Encounter (HOSPITAL_COMMUNITY): Payer: Self-pay | Admitting: Orthopedic Surgery

## 2022-11-01 ENCOUNTER — Other Ambulatory Visit (HOSPITAL_COMMUNITY): Payer: Medicare Other

## 2022-11-01 DIAGNOSIS — Z951 Presence of aortocoronary bypass graft: Secondary | ICD-10-CM | POA: Diagnosis not present

## 2022-11-01 DIAGNOSIS — D62 Acute posthemorrhagic anemia: Secondary | ICD-10-CM | POA: Diagnosis not present

## 2022-11-01 DIAGNOSIS — I499 Cardiac arrhythmia, unspecified: Secondary | ICD-10-CM | POA: Diagnosis not present

## 2022-11-01 DIAGNOSIS — S72002A Fracture of unspecified part of neck of left femur, initial encounter for closed fracture: Secondary | ICD-10-CM | POA: Diagnosis not present

## 2022-11-01 DIAGNOSIS — Z950 Presence of cardiac pacemaker: Secondary | ICD-10-CM | POA: Diagnosis not present

## 2022-11-01 DIAGNOSIS — Z952 Presence of prosthetic heart valve: Secondary | ICD-10-CM

## 2022-11-01 LAB — CBC
HCT: 26.5 % — ABNORMAL LOW (ref 36.0–46.0)
Hemoglobin: 9.3 g/dL — ABNORMAL LOW (ref 12.0–15.0)
MCH: 31.5 pg (ref 26.0–34.0)
MCHC: 35.1 g/dL (ref 30.0–36.0)
MCV: 89.8 fL (ref 80.0–100.0)
Platelets: 162 10*3/uL (ref 150–400)
RBC: 2.95 MIL/uL — ABNORMAL LOW (ref 3.87–5.11)
RDW: 12.8 % (ref 11.5–15.5)
WBC: 11.6 10*3/uL — ABNORMAL HIGH (ref 4.0–10.5)
nRBC: 0 % (ref 0.0–0.2)

## 2022-11-01 LAB — COMPREHENSIVE METABOLIC PANEL
ALT: 19 U/L (ref 0–44)
AST: 27 U/L (ref 15–41)
Albumin: 2.7 g/dL — ABNORMAL LOW (ref 3.5–5.0)
Alkaline Phosphatase: 48 U/L (ref 38–126)
Anion gap: 11 (ref 5–15)
BUN: 17 mg/dL (ref 8–23)
CO2: 20 mmol/L — ABNORMAL LOW (ref 22–32)
Calcium: 8 mg/dL — ABNORMAL LOW (ref 8.9–10.3)
Chloride: 101 mmol/L (ref 98–111)
Creatinine, Ser: 1.17 mg/dL — ABNORMAL HIGH (ref 0.44–1.00)
GFR, Estimated: 45 mL/min — ABNORMAL LOW (ref >60)
Glucose, Bld: 169 mg/dL — ABNORMAL HIGH (ref 70–99)
Potassium: 3.6 mmol/L (ref 3.5–5.1)
Sodium: 132 mmol/L — ABNORMAL LOW (ref 135–145)
Total Bilirubin: 1 mg/dL (ref 0.3–1.2)
Total Protein: 5 g/dL — ABNORMAL LOW (ref 6.5–8.1)

## 2022-11-01 LAB — MAGNESIUM: Magnesium: 1.8 mg/dL (ref 1.7–2.4)

## 2022-11-01 MED ORDER — MAGNESIUM SULFATE 2 GM/50ML IV SOLN
2.0000 g | Freq: Once | INTRAVENOUS | Status: AC
  Administered 2022-11-01: 2 g via INTRAVENOUS
  Filled 2022-11-01: qty 50

## 2022-11-01 MED ORDER — POTASSIUM CHLORIDE CRYS ER 20 MEQ PO TBCR
40.0000 meq | EXTENDED_RELEASE_TABLET | Freq: Once | ORAL | Status: AC
  Administered 2022-11-01: 40 meq via ORAL
  Filled 2022-11-01: qty 2

## 2022-11-01 NOTE — Plan of Care (Signed)
  Problem: Education: Goal: Knowledge of General Education information will improve Description: Including pain rating scale, medication(s)/side effects and non-pharmacologic comfort measures Outcome: Progressing   Problem: Health Behavior/Discharge Planning: Goal: Ability to manage health-related needs will improve Outcome: Progressing   Problem: Clinical Measurements: Goal: Ability to maintain clinical measurements within normal limits will improve Outcome: Progressing Goal: Will remain free from infection Outcome: Progressing Goal: Diagnostic test results will improve Outcome: Progressing Goal: Respiratory complications will improve Outcome: Progressing Goal: Cardiovascular complication will be avoided Outcome: Progressing   Problem: Activity: Goal: Risk for activity intolerance will decrease Outcome: Progressing   Problem: Nutrition: Goal: Adequate nutrition will be maintained Outcome: Progressing   Problem: Coping: Goal: Level of anxiety will decrease Outcome: Progressing   Problem: Elimination: Goal: Will not experience complications related to bowel motility Outcome: Progressing Goal: Will not experience complications related to urinary retention Outcome: Progressing   Problem: Pain Managment: Goal: General experience of comfort will improve Outcome: Progressing   Problem: Safety: Goal: Ability to remain free from injury will improve Outcome: Progressing   Problem: Skin Integrity: Goal: Risk for impaired skin integrity will decrease Outcome: Progressing   Problem: Education: Goal: Knowledge of risk factors and measures for prevention of condition will improve Outcome: Progressing   Problem: Coping: Goal: Psychosocial and spiritual needs will be supported Outcome: Progressing   Problem: Respiratory: Goal: Will maintain a patent airway Outcome: Progressing Goal: Complications related to the disease process, condition or treatment will be avoided or  minimized Outcome: Progressing   Problem: Education: Goal: Knowledge of disease or condition will improve Outcome: Progressing Goal: Knowledge of secondary prevention will improve (MUST DOCUMENT ALL) Outcome: Progressing Goal: Knowledge of patient specific risk factors will improve Elta [place] N/A or DELETE if not current risk factor) Outcome: Progressing   Problem: Ischemic Stroke/TIA Tissue Perfusion: Goal: Complications of ischemic stroke/TIA will be minimized Outcome: Progressing   Problem: Coping: Goal: Will verbalize positive feelings about self Outcome: Progressing Goal: Will identify appropriate support needs Outcome: Progressing   Problem: Health Behavior/Discharge Planning: Goal: Ability to manage health-related needs will improve Outcome: Progressing Goal: Goals will be collaboratively established with patient/family Outcome: Progressing   Problem: Self-Care: Goal: Ability to participate in self-care as condition permits will improve Outcome: Progressing Goal: Verbalization of feelings and concerns over difficulty with self-care will improve Outcome: Progressing Goal: Ability to communicate needs accurately will improve Outcome: Progressing   Problem: Nutrition: Goal: Risk of aspiration will decrease Outcome: Progressing Goal: Dietary intake will improve Outcome: Progressing   Problem: Education: Goal: Knowledge of the prescribed therapeutic regimen will improve Outcome: Progressing Goal: Understanding of discharge needs will improve Outcome: Progressing Goal: Individualized Educational Video(s) Outcome: Progressing   Problem: Activity: Goal: Ability to avoid complications of mobility impairment will improve Outcome: Progressing Goal: Ability to tolerate increased activity will improve Outcome: Progressing   Problem: Clinical Measurements: Goal: Postoperative complications will be avoided or minimized Outcome: Progressing   Problem: Pain  Management: Goal: Pain level will decrease with appropriate interventions Outcome: Progressing   Problem: Skin Integrity: Goal: Will show signs of wound healing Outcome: Progressing

## 2022-11-01 NOTE — Progress Notes (Addendum)
Cardologist:  Croitoru   Subjective:  Denies SSCP, palpitations or Dyspnea Dementia but pleasant Post left THR  Objective:  Vitals:   10/31/22 1938 11/01/22 0037 11/01/22 0455 11/01/22 0732  BP: (!) 147/73 131/66 139/70 131/63  Pulse: 88 70 73 70  Resp:  16 15 16   Temp: 98.2 F (36.8 C) 98.6 F (37 C) 98.7 F (37.1 C) 99.2 F (37.3 C)  TempSrc: Oral Oral Oral Oral  SpO2: 96% 95% 97% 95%    Intake/Output from previous day:  Intake/Output Summary (Last 24 hours) at 11/01/2022 0847 Last data filed at 11/01/2022 0048 Gross per 24 hour  Intake 1100 ml  Output 1100 ml  Net 0 ml    Physical Exam: Elderly female Post left THR SEM through old AVR post sternotomy PPM under left clavicle  Abdomen benign No edema  Lungs clear   Lab Results: Basic Metabolic Panel: Recent Labs    10/30/22 1108 11/01/22 0418  NA 134* 132*  K 3.4* 3.6  CL 99 101  CO2 23 20*  GLUCOSE 148* 169*  BUN 12 17  CREATININE 1.07* 1.17*  CALCIUM 8.0* 8.0*  MG 1.7 1.8   Liver Function Tests: Recent Labs    10/30/22 1108 11/01/22 0418  AST 33 27  ALT 44 19  ALKPHOS 62 48  BILITOT 1.1 1.0  PROT 5.9* 5.0*  ALBUMIN 3.2* 2.7*   Recent Labs    10/29/22 1436  LIPASE 30   CBC: Recent Labs    10/29/22 1436 10/30/22 1108 11/01/22 0418  WBC 13.2* 7.6 11.6*  NEUTROABS 11.8*  --   --   HGB 15.0 12.9 9.3*  HCT 43.4 36.6 26.5*  MCV 88.6 89.1 89.8  PLT 240 181 162     Imaging: DG HIP UNILAT WITH PELVIS 2-3 VIEWS RIGHT  Result Date: 10/31/2022 CLINICAL DATA:  Left hip fracture repair Fluoroscopy time: 15.2 seconds. Cumulative air kerma: 1.74 mGy Images: 3 EXAM: DG HIP (WITH OR WITHOUT PELVIS) 2-3V RIGHT COMPARISON:  None Available. FINDINGS: Three images were obtained during left hip replacement. IMPRESSION: Left hip fracture repair. Electronically Signed   By: 12/30/2022 III M.D.   On: 10/31/2022 13:00   EEG adult  Result Date: 10/30/2022 12/29/2022, MD     10/30/2022  6:44  PM Routine EEG Report [name] is a 89 y.o. female with a history of altered mental status who is undergoing an EEG to evaluate for seizures. Report: This EEG was acquired with electrodes placed according to the International 10-20 electrode system (including Fp1, Fp2, F3, F4, C3, C4, P3, P4, O1, O2, T3, T4, T5, T6, A1, A2, Fz, Cz, Pz). The following electrodes were missing or displaced: none. The occipital dominant rhythm was 5-7 Hz. This activity is reactive to stimulation. Drowsiness was manifested by background fragmentation; deeper stages of sleep were identified by K complexes and sleep spindles. There was no focal slowing. There were no interictal epileptiform discharges. There were no electrographic seizures identified. Photic stimulation and hyperventilation were not performed. Impression and clinical correlation: This EEG was obtained while awake and asleep and is abnormal due to mild diffuse slowing indicative of global cerebral dysfunction. Epileptiform abnormalities were not seen during this recording. 89, MD [facility] [phone] If 7pm- 7am, please page neurology on call as listed in AMION.    Cardiac Studies:  ECG: SR PAC RBBB/LAFB   Telemetry:  A pacing   Echo: pending  Medications:    acetaminophen  1,000  mg Oral Q8H   aspirin  81 mg Oral BID   atorvastatin  40 mg Oral Daily   brimonidine  1 drop Left Eye BID   Chlorhexidine Gluconate Cloth  6 each Topical Daily   dexamethasone (DECADRON) injection  10 mg Intravenous Once   docusate sodium  100 mg Oral BID   levothyroxine  50 mcg Oral Q0600   polyethylene glycol  17 g Oral BID      sodium chloride 75 mL/hr at 11/01/22 0145   methocarbamol (ROBAXIN) IV      Assessment/Plan:   Ortho:  post left THR pain control good PT/OT CABG:  no angina ontinue ASA and statin  AVR:  did not get preoperative echo have ordered Bioprosthetic AVR 2009 Last TTE done 2020 with EF 60-65% mean gradient 12 peak  20 mmhg no AR Thyroid: continue synthroid replacement  PPM:  normal function most recent PACEART 08/30/22   Daughter not happy with assisted living care in [place] D/c planning per primary service ? Inpatient rehab Dementia stable   Jenkins Rouge 11/01/2022, 8:47 AM

## 2022-11-01 NOTE — Progress Notes (Addendum)
PT Cancellation Note  Patient Details Name: [name] MRN: [id number] DOB: June 07, 1934   Cancelled Treatment:    Reason Eval/Treat Not Completed: Need weight-bearing status updated/clarified  Patient has NWB order from prior to surgery. Anticipate this will be updated to allow weight-bearing. Await ortho input.   Addendum-noted incr to WBAT listed under a progressive mobility order. Will proceed with PT evaluation.    [place]  Office [phone]  Rexanne Mano 11/01/2022, 10:14 AM

## 2022-11-01 NOTE — Evaluation (Signed)
Clinical/Bedside Swallow Evaluation Patient Details  Name: [name] MRN: [id number] Date of Birth: Mar 22, 1934  Today's Date: 11/01/2022 Time: SLP Start Time (ACUTE ONLY): 3295 SLP Stop Time (ACUTE ONLY): 0950 SLP Time Calculation (min) (ACUTE ONLY): 7 min  Past Medical History:  Past Medical History:  Diagnosis Date   AAA (abdominal aortic aneurysm) (HCC) 01/03/13   3.5x3.3cm   Alzheimer's dementia (HCC)    "probably middle stage" (11/05/2014)   Anemia    "hx of chronic" (11/05/2014)   Aortic stenosis 03/29/2008   biological prosthetic replacement   Arthritis    "joints" (11/05/2014)   Asthma    Avascular necrosis of bone of hip, right (HCC) 07/28/2018   Chronic asthmatic bronchitis    "q fall and winter" (11/05/2014)   Closed nondisplaced fracture of middle phalanx of lesser toe of right foot 01/05/2021   Coronary artery disease    CABG 03/29/08   Dyslipidemia    Exertional shortness of breath    Family history of adverse reaction to anesthesia    "daughter gets PONV too"   Heart murmur    Hypertension    Hypothyroidism    LBBB (left bundle branch block)    Migraines    "none in years' (11/05/2014)   Mobitz type 2 second degree AV block 10/31/2014   MVP (mitral valve prolapse)    with mild mitral insufficiency/notes 08/17/2013   Near syncope 08/17/2013   PONV (postoperative nausea and vomiting)    Presence of permanent cardiac pacemaker    Sinus pause 10/31/2014   Stroke (HCC) 03/2008   "2 light strokes"; denies residual on 11/05/2014   Urinary frequency    Past Surgical History:  Past Surgical History:  Procedure Laterality Date   ABDOMINAL HYSTERECTOMY  1977   AORTIC VALVE REPLACEMENT  03/29/2008   PERICARDIAL TISSUE VALVE   APPENDECTOMY  ?1977   CARDIAC CATHETERIZATION  ~ 2009   CATARACT EXTRACTION W/ INTRAOCULAR LENS IMPLANT Left    CORONARY ARTERY BYPASS GRAFT  03/29/2008   LIMA TO LAD,SVG TO CX,SVG TO LEFT POSTEROLATERAL BRANCH   HIP FRACTURE SURGERY Right 06/2010    "3 metal screws"    INSERT / REPLACE / REMOVE PACEMAKER  11/05/2014   LOOP RECORDER EXPLANT N/A 11/05/2014   Procedure: LOOP RECORDER EXPLANT;  Surgeon: [name], MD;  Location: [facility];  Service: Cardiovascular;  Laterality: N/A;   LOOP RECORDER IMPLANT N/A 09/17/2013   Procedure: LOOP RECORDER IMPLANT;  Surgeon: [name], MD;  Location: [facility]ATH [facility];  Service: Cardiovascular;  Laterality: N/A;   PERMANENT PACEMAKER INSERTION N/A 11/05/2014   Procedure: PERMANENT PACEMAKER INSERTION;  Surgeon: [name], MD;  Location: [facility]ATH [facility];  Service: Cardiovascular;  Laterality: N/A;   REFRACTIVE SURGERY Bilateral    /notes 04/28/2008 (08/17/2013)   RETINAL DETACHMENT SURGERY Left 1994   [name] 04/10/2008 (08/17/2013)   TIBIAL TUBERCLERPLASTY  11/05/2014   tooth pulled   06/2018   TOTAL HIP ARTHROPLASTY Right 07/31/2018   Procedure: RIGHT TOTAL HIP ARTHROPLASTY ANTERIOR APPROACH;  Surgeon: Gean Birchwood, MD;  Location: [facility];  Service: Orthopedics;  Laterality: Right;   UTERINE FIBROID SURGERY  1960's   HPI:  89 y.o. female with medical history significant for Alzheimer's dementia, hypertension, hyperlipidemia, asthma, CAD status post CABG, chronic HFpEF, stroke, hypothyroidism, CKD stage IIIa, LBBB, aortic valve replacement, mitral valve prolapse/mitral regurgitation, paroxysmal atrial tachycardia/A-fib, pacemaker, AAA, PAD.  Recently admitted 12/28-12/29 for acute metabolic encephalopathy secondary to COVID-19 infection. At that time chest x-ray was  showing an infiltrate. Patient presented to [facility] ED again for evaluation of left hip pain, recurrent left-sided weakness, and expressive aphasia, found to have a left hip fracture. On 1/7/ pt has total hip arthroplasty anterior approach.    Assessment / Plan / Recommendation  Clinical Impression  Pt alert with intact dentition and strong congesed cough. Family denies history or present dsyphagia. Swallows with thin, puree and solid were  coordinated without s/s aspiration across textures and overall risk appears low. Family stated she drank diet Coke this morning without difficulties. Recommend she continue regular texture, thin liquids and pills with thin. No ST needed. SLP Visit Diagnosis: Dysphagia, unspecified (R13.10)    Aspiration Risk  Mild aspiration risk    Diet Recommendation Regular;Thin liquid   Liquid Administration via: Straw;Cup Medication Administration: Whole meds with liquid Supervision: Patient able to self feed Compensations: Slow rate;Small sips/bites Postural Changes: Seated upright at 90 degrees    Other  Recommendations Oral Care Recommendations: Oral care BID    Recommendations for follow up therapy are one component of a multi-disciplinary discharge planning process, led by the attending physician.  Recommendations may be updated based on patient status, additional functional criteria and insurance authorization.  Follow up Recommendations No SLP follow up      Assistance Recommended at Discharge    Functional Status Assessment Patient has not had a recent decline in their functional status  Frequency and Duration            Prognosis        Swallow Study   General Date of Onset: 10/30/22 HPI: 89 y.o. female with medical history significant for Alzheimer's dementia, hypertension, hyperlipidemia, asthma, CAD status post CABG, chronic HFpEF, stroke, hypothyroidism, CKD stage IIIa, LBBB, aortic valve replacement, mitral valve prolapse/mitral regurgitation, paroxysmal atrial tachycardia/A-fib, pacemaker, AAA, PAD.  Recently admitted 12/28-12/29 for acute metabolic encephalopathy secondary to COVID-19 infection. At that time chest x-ray was showing an infiltrate. Patient presented to [facility] ED again for evaluation of left hip pain, recurrent left-sided weakness, and expressive aphasia, found to have a left hip fracture. On 1/7/ pt has total hip arthroplasty anterior approach. Type of Study:  Bedside Swallow Evaluation Previous Swallow Assessment:  (none) Diet Prior to this Study: Regular;Thin liquids Temperature Spikes Noted: No Respiratory Status: Room air History of Recent Intubation: Yes Length of Intubations (days):  (during surgery) Date extubated: 10/31/22 Behavior/Cognition: Alert;Cooperative;Pleasant mood Oral Cavity Assessment: Within Functional Limits Oral Care Completed by SLP: No Oral Cavity - Dentition: Adequate natural dentition Vision: Functional for self-feeding Self-Feeding Abilities: Able to feed self Patient Positioning: Upright in bed Baseline Vocal Quality: Normal Volitional Cough: Strong;Congested Volitional Swallow: Able to elicit    Oral/Motor/Sensory Function Overall Oral Motor/Sensory Function: Within functional limits   Ice Chips Ice chips: Not tested   Thin Liquid Thin Liquid: Within functional limits Presentation: Straw    Nectar Thick Nectar Thick Liquid: Not tested   Honey Thick Honey Thick Liquid: Not tested   Puree Puree: Within functional limits   Solid     Solid: Within functional limits      Mick Sell Orbie Pyo 11/01/2022,9:57 AM

## 2022-11-01 NOTE — Progress Notes (Signed)
[facility] PROGRESS NOTE   [name] ZDG:[id number] DOB: 05/30/1934 DOA: 10/30/2022  PCP: Doreene Nest, NP  Brief History/Interval Summary: 89 y.o. female with medical history significant for Alzheimer's dementia, hypertension, hyperlipidemia, asthma, CAD status post CABG, chronic HFpEF, stroke, hypothyroidism, CKD stage IIIa, LBBB, history of aortic valve replacement, mitral valve prolapse/mitral regurgitation, paroxysmal atrial tachycardia/A-fib not on anticoagulation per cardiology due to extremely low burden of arrhythmia, has a pacemaker, AAA, PAD.  Recently admitted 12/28-12/29 for acute metabolic encephalopathy secondary to COVID-19 infection.  Patient was admitted due to left lower extremity weakness and concern for CVA but neurology felt this was less likely.  CT head negative for acute finding.  Brain MRI could not be done due to presence of a pacemaker.  Chest x-ray was showing an infiltrate and she was treated with Paxlovid.  She was not hypoxic. Patient presented to [facility] ED again for evaluation of left hip pain, recurrent left-sided weakness, and expressive aphasia.  Found to have a left hip fracture. ED physician spoke to Dr. Selina Cooley with neurology who recommended brain MRI for further evaluation could not be done at [place] patient has a pacemaker.  Case was discussed with radiology and ED physician was told that MRI could be done at [facility].  ED physician consulted Dr. Ave Filter with orthopedics who agrees to see the patient in consultation at [facility].  Patient was transferred to [facility] for further evaluation.   Consultants: Cardiology.  Orthopedics.  Neurology  Procedures:  Left total hip replacement using anterior approach, 1/7    Subjective/Interval History: Patient noted to be awake but distracted.  Daughter is at the bedside.  Patient denies any pain.  No chest pain shortness of breath.  No nausea or vomiting.      Assessment/Plan:  Recurrent strokelike symptoms Patient presented with left-sided weakness facial droop and expressive aphasia CT head was negative for acute findings.  Patient had recent hospitalization for similar complaints.  She was admitted to [facility].  She was seen by neurology at that time.  They felt that her presentation was most likely secondary to exacerbation of pre-existing deficits due to COVID infection.  Stroke thought to be less likely. Due to recurrent symptoms patient was thought to require MRI.  She has a pacemaker.  She was transferred to [facility].  Informed by radiology team that the pacemaker is unsafe for MRI. Patient seen by neurology.  EEG was ordered which is negative for epileptiform activity.  Neurology does not recommend any further investigations. Patient noted to be on aspirin and statin. LDL was 76 on December 29.  HbA1c 6.0 on December 29. No recent echocardiogram in our system.  Last 1 was from 2020 which showed normal systolic function.  No significant valvular abnormalities were noted. No clear indication from a neurological standpoint to do an echocardiogram.  Left hip fracture Seen by orthopedics.  Underwent surgery on 1/7.  PT and OT evaluation.  Normocytic anemia/acute blood loss anemia postoperatively Drop in hemoglobin is likely due to hip fracture and surgery.  Will recheck hemoglobin tomorrow.  Transfuse for hemoglobin less than 8 considering her significant cardiac disease.  Unspecified arrhythmia Pacemaker interrogation does not reveal any evidence for atrial fibrillation.  She does have episodes of atrial tachycardia PACs and PVCs.  No indication for anticoagulation per cardiology.  Monitor on telemetry for now.  Pacemaker in situ Stable.  Pacemaker interrogated.  Coronary artery disease status post CABG Stable from a cardiac standpoint  otherwise.  Continue aspirin and statin.  Hypokalemia Potassium level has improved.  Will give  additional dose.  Magnesium is 1.8.  Recent COVID-19 infection Looks like she completed course of Paxlovid.  No respiratory symptoms currently.  She was tested on 12/28.  Day 1 will be 12/29.  Day 10 will be 1/7.  Can come off of isolation on 1/8.  Chronic's diastolic CHF Stable.  Euvolemic.  History of Alzheimer's dementia Stable.  Essential hypertension Blood pressure noted to be stable for the most part.  Not on any blood pressure lowering agents currently.  Was on losartan prior to admission which is currently on hold.  Reevaluate tomorrow whether it can be resumed.  Hypothyroidism Levothyroxine  DVT Prophylaxis: SCDs.  Aspirin twice a day as per orthopedics Code Status: DNR Family Communication: Discussed with daughter at the bedside Disposition Plan: To be determined  Status is: Inpatient Remains inpatient appropriate because: Left hip fracture    Medications: Scheduled:  acetaminophen  1,000 mg Oral Q8H   aspirin  81 mg Oral BID   atorvastatin  40 mg Oral Daily   brimonidine  1 drop Left Eye BID   Chlorhexidine Gluconate Cloth  6 each Topical Daily   docusate sodium  100 mg Oral BID   levothyroxine  50 mcg Oral Q0600   polyethylene glycol  17 g Oral BID   Continuous:  sodium chloride 75 mL/hr at 11/01/22 0145   methocarbamol (ROBAXIN) IV     ZOX:[id number], diphenhydrAMINE, HYDROmorphone (DILAUDID) injection, HYDROmorphone, menthol-cetylpyridinium **OR** phenol, methocarbamol **OR** methocarbamol (ROBAXIN) IV, metoCLOPramide **OR** metoCLOPramide (REGLAN) injection, ondansetron **OR** ondansetron (ZOFRAN) IV  Antibiotics: Anti-infectives (From admission, onward)    Start     Dose/Rate Route Frequency Ordered Stop   10/31/22 1230  ceFAZolin (ANCEF) IVPB 2g/100 mL premix        2 g 200 mL/hr over 30 Minutes Intravenous Every 6 hours 10/31/22 1137 10/31/22 1838   10/31/22 0600  ceFAZolin (ANCEF) IVPB 2g/100 mL premix        2 g 200 mL/hr over 30 Minutes  Intravenous On call to O.R. 10/30/22 1832 10/31/22 0725       Objective:  Vital Signs  Vitals:   11/01/22 0037 11/01/22 0455 11/01/22 0732 11/01/22 0941  BP: 131/66 139/70 131/63   Pulse: 70 73 70   Resp: 16 15 16    Temp: 98.6 F (37 C) 98.7 F (37.1 C) 99.2 F (37.3 C)   TempSrc: Oral Oral Oral   SpO2: 95% 97% 95%   Weight:    71 kg  Height:    5\' 9"  (1.753 m)    Intake/Output Summary (Last 24 hours) at 11/01/2022 1048 Last data filed at 11/01/2022 0048 Gross per 24 hour  Intake 100 ml  Output 500 ml  Net -400 ml    Filed Weights   11/01/22 0941  Weight: 71 kg    General appearance: Awake alert.  In no distress.  Distracted Resp: Clear to auscultation bilaterally.  Normal effort Cardio: S1-S2 is normal regular.  No S3-S4.  No rubs murmurs or bruit GI: Abdomen is soft.  Nontender nondistended.  Bowel sounds are present normal.  No masses organomegaly Extremities: Swelling noted left thigh area.  No bruising.  There is no focal neurological deficits.   Lab Results:  Data Reviewed: I have personally reviewed following labs and reports of the imaging studies  CBC: Recent Labs  Lab 10/29/22 1436 10/30/22 1108 11/01/22 0418  WBC 13.2* 7.6 11.6*  NEUTROABS  11.8*  --   --   HGB 15.0 12.9 9.3*  HCT 43.4 36.6 26.5*  MCV 88.6 89.1 89.8  PLT 240 181 162     Basic Metabolic Panel: Recent Labs  Lab 10/29/22 1436 10/30/22 1108 11/01/22 0418  NA 135 134* 132*  K 2.9* 3.4* 3.6  CL 95* 99 101  CO2 25 23 20*  GLUCOSE 136* 148* 169*  BUN 15 12 17   CREATININE 0.99 1.07* 1.17*  CALCIUM 9.1 8.0* 8.0*  MG  --  1.7 1.8     GFR: Estimated Creatinine Clearance: 36.1 mL/min (A) (by C-G formula based on SCr of 1.17 mg/dL (H)).  Liver Function Tests: Recent Labs  Lab 10/29/22 1436 10/30/22 1108 11/01/22 0418  AST 44* 33 27  ALT 46* 44 19  ALKPHOS 80 62 48  BILITOT 1.4* 1.1 1.0  PROT 7.6 5.9* 5.0*  ALBUMIN 4.3 3.2* 2.7*     Recent Labs  Lab  10/29/22 1436  LIPASE 30     Coagulation Profile: Recent Labs  Lab 10/29/22 1436  INR 1.0      Recent Results (from the past 240 hour(s))  Surgical pcr screen     Status: None   Collection Time: 10/31/22  4:54 AM   Specimen: Nasal Mucosa; Nasal Swab  Result Value Ref Range Status   MRSA, PCR NEGATIVE NEGATIVE Final   Staphylococcus aureus NEGATIVE NEGATIVE Final    Comment: (NOTE) The Xpert SA Assay (FDA approved for NASAL specimens in patients 84 years of age and older), is one component of a comprehensive surveillance program. It is not intended to diagnose infection nor to guide or monitor treatment. Performed at [facility], [place] [address], [place], [place] [address]        Radiology Studies: DG HIP UNILAT WITH PELVIS 2-3 VIEWS RIGHT  Result Date: 10/31/2022 CLINICAL DATA:  Left hip fracture repair Fluoroscopy time: 15.2 seconds. Cumulative air kerma: 1.74 mGy Images: 3 EXAM: DG HIP (WITH OR WITHOUT PELVIS) 2-3V RIGHT COMPARISON:  None Available. FINDINGS: Three images were obtained during left hip replacement. IMPRESSION: Left hip fracture repair. Electronically Signed   By: Dorise Bullion III M.D.   On: 10/31/2022 13:00   EEG adult  Result Date: 10/30/2022 Derek Jack, MD     10/30/2022  6:44 PM Routine EEG Report RAEGEN TARPLEY is a 89 y.o. female with a history of altered mental status who is undergoing an EEG to evaluate for seizures. Report: This EEG was acquired with electrodes placed according to the International 10-20 electrode system (including Fp1, Fp2, F3, F4, C3, C4, P3, P4, O1, O2, T3, T4, T5, T6, A1, A2, Fz, Cz, Pz). The following electrodes were missing or displaced: none. The occipital dominant rhythm was 5-7 Hz. This activity is reactive to stimulation. Drowsiness was manifested by background fragmentation; deeper stages of sleep were identified by K complexes and sleep spindles. There was no focal slowing. There were no interictal  epileptiform discharges. There were no electrographic seizures identified. Photic stimulation and hyperventilation were not performed. Impression and clinical correlation: This EEG was obtained while awake and asleep and is abnormal due to mild diffuse slowing indicative of global cerebral dysfunction. Epileptiform abnormalities were not seen during this recording. Su Monks, MD [facility] [phone] If 7pm- 7am, please page neurology on call as listed in [place].       LOS: 2 days   [facility] Pager on [email or web address]  11/01/2022, 10:48 AM

## 2022-11-01 NOTE — TOC Progression Note (Signed)
Transition of Care [facility]) - Progression Note    Patient Details  Name: [name] MRN: [id number] Date of Birth: [birth year]  Transition of Care [facility]) CM/SW Contact  Jinger Neighbors, [place] Phone Number: 11/01/2022, 2:48 PM  Clinical Narrative:     CSW attempted to contact [name], pt's daughter, and left voicemail asking for for a returned call.        Expected Discharge Plan and Services                                               Social Determinants of Health (SDOH) Interventions SDOH Screenings   Food Insecurity: No Food Insecurity (10/30/2022)  Housing: Low Risk  (10/30/2022)  Transportation Needs: No Transportation Needs (10/30/2022)  Utilities: Not At Risk (10/30/2022)  Depression (PHQ2-9): Low Risk  (09/09/2021)  Financial Resource Strain: Low Risk  (06/26/2019)  Physical Activity: Inactive (06/26/2019)  Stress: No Stress Concern Present (06/26/2019)  Tobacco Use: Low Risk  (11/01/2022)    Readmission Risk Interventions     No data to display

## 2022-11-01 NOTE — Progress Notes (Signed)
Subjective: 1 Day Post-Op Procedure(s) (LRB): TOTAL HIP ARTHROPLASTY ANTERIOR APPROACH (Left)  Patient seen in rounds for Dr. Charlann Boxer.  Patient is resting in bed on exam this morning. No acute events overnight. Patient with advanced dementia. Family at the bedside. They tell me that she did not sleep last night due to hallucinations which she has been experiencing the past several weeks.   We will start therapy today.   Objective: Vital signs in last 24 hours: Temp:  [97.3 F (36.3 C)-99.2 F (37.3 C)] 99.2 F (37.3 C) (01/08 0732) Pulse Rate:  [70-88] 70 (01/08 0732) Resp:  [11-16] 16 (01/08 0732) BP: (131-170)/(63-82) 131/63 (01/08 0732) SpO2:  [93 %-100 %] 95 % (01/08 0732)  Intake/Output from previous day:  Intake/Output Summary (Last 24 hours) at 11/01/2022 0754 Last data filed at 11/01/2022 0048 Gross per 24 hour  Intake 1100 ml  Output 1100 ml  Net 0 ml     Intake/Output this shift: No intake/output data recorded.  Labs: Recent Labs    10/29/22 1436 10/30/22 1108 11/01/22 0418  HGB 15.0 12.9 9.3*   Recent Labs    10/30/22 1108 11/01/22 0418  WBC 7.6 11.6*  RBC 4.11 2.95*  HCT 36.6 26.5*  PLT 181 162   Recent Labs    10/30/22 1108 11/01/22 0418  NA 134* 132*  K 3.4* 3.6  CL 99 101  CO2 23 20*  BUN 12 17  CREATININE 1.07* 1.17*  GLUCOSE 148* 169*  CALCIUM 8.0* 8.0*   Recent Labs    10/29/22 1436  INR 1.0    Exam: General - Patient is Alert and Confused Extremity - Neurologically intact Sensation intact distally Intact pulses distally Dorsiflexion/Plantar flexion intact Dressing - dressing C/D/I Motor Function - intact, moving foot and toes well on exam.   Past Medical History:  Diagnosis Date   AAA (abdominal aortic aneurysm) (HCC) 01/03/13   3.5x3.3cm   Alzheimer's dementia (HCC)    "probably middle stage" (11/05/2014)   Anemia    "hx of chronic" (11/05/2014)   Aortic stenosis 03/29/2008   biological prosthetic replacement    Arthritis    "joints" (11/05/2014)   Asthma    Avascular necrosis of bone of hip, right (HCC) 07/28/2018   Chronic asthmatic bronchitis    "q fall and winter" (11/05/2014)   Closed nondisplaced fracture of middle phalanx of lesser toe of right foot 01/05/2021   Coronary artery disease    CABG 03/29/08   Dyslipidemia    Exertional shortness of breath    Family history of adverse reaction to anesthesia    "daughter gets PONV too"   Heart murmur    Hypertension    Hypothyroidism    LBBB (left bundle branch block)    Migraines    "none in years' (11/05/2014)   Mobitz type 2 second degree AV block 10/31/2014   MVP (mitral valve prolapse)    with mild mitral insufficiency/notes 08/17/2013   Near syncope 08/17/2013   PONV (postoperative nausea and vomiting)    Presence of permanent cardiac pacemaker    Sinus pause 10/31/2014   Stroke (HCC) 03/2008   "2 light strokes"; denies residual on 11/05/2014   Urinary frequency     Assessment/Plan: 1 Day Post-Op Procedure(s) (LRB): TOTAL HIP ARTHROPLASTY ANTERIOR APPROACH (Left) Principal Problem:   Stroke-like symptoms Active Problems:   HTN (hypertension)   Dementia (HCC)   Hypothyroidism   Hypokalemia   HLD (hyperlipidemia)   Hip fracture (HCC)   Arrhythmia  S/P total left hip arthroplasty  Estimated body mass index is 23.11 kg/m as calculated from the following:   Height as of 10/20/22: 5\' 9"  (1.753 m).   Weight as of 10/20/22: 71 kg. Advance diet Up with therapy  DVT Prophylaxis - Aspirin Weight bearing as tolerated.  Hgb stable at 9.3 this AM.  Patient with advanced dementia. Family feels she will require SNF placement at discharge which I agree with.  She has not used any narcotics so far, and I would recommend using tylenol/methocarbamol primarily unless significant pain.   Griffith Citron, PA-C Orthopedic Surgery [phone] 11/01/2022, 7:54 AM

## 2022-11-01 NOTE — Evaluation (Signed)
Physical Therapy Evaluation Patient Details Name: [name] MRN: [id number] DOB: [birth year] Today's Date: 11/01/2022  History of Present Illness  89 y/o female presented to ED on 10/30/22 for evaluation of left hip pain, recurrent left-sided weakness, and expressive aphasia. She had 2 falls in 24 hours. CT head negative; unable to have MRI due to pacemaker; +left femoral neck fx, s/p Left total hip replacement through an anterior approach 10/31/22;  PMH: AAA, CAD s/p CABG, HTN, asthma, dementia, CKD, pacemaker, CVA, CHF, AVR, afib not on anticoagulation,  Clinical Impression   Patient is s/p above surgery resulting in functional limitations due to the deficits listed below (see PT Problem List). Per daughter, pt was living in ALF and receiving PT 3x/week. Patient currently requires min assist for all aspects of mobility and could benefit from increased frequency of therapies at SNF level of care. Daughter hopeful for this plan to maximize her therapies (pt was also getting OT and SLP at ALF). Patient will benefit from skilled PT to increase their independence and safety with mobility to allow discharge to the venue listed below.          Recommendations for follow up therapy are one component of a multi-disciplinary discharge planning process, led by the attending physician.  Recommendations may be updated based on patient status, additional functional criteria and insurance authorization.  Follow Up Recommendations Skilled nursing-short term rehab (<3 hours/day) Can patient physically be transported by private vehicle: Yes    Assistance Recommended at Discharge Frequent or constant Supervision/Assistance  Patient can return home with the following  A little help with walking and/or transfers;A little help with bathing/dressing/bathroom;Direct supervision/assist for medications management;Direct supervision/assist for financial management    Equipment Recommendations None recommended by PT   Recommendations for Other Services       Functional Status Assessment Patient has had a recent decline in their functional status and demonstrates the ability to make significant improvements in function in a reasonable and predictable amount of time.     Precautions / Restrictions Precautions Precautions: Fall Restrictions Weight Bearing Restrictions: Yes LLE Weight Bearing: Weight bearing as tolerated      Mobility  Bed Mobility Overal bed mobility: Needs Assistance Bed Mobility: Supine to Sit, Sit to Supine     Supine to sit: Min assist Sit to supine: Min assist   General bed mobility comments: pt comes to long-sitting with supervision; exit bed to left; assist with bringing hips around to reach feet to floor; assist to raise LLE up onto bed    Transfers Overall transfer level: Needs assistance Equipment used: Rolling walker (2 wheels) Transfers: Sit to/from Stand Sit to Stand: Min guard           General transfer comment: cues for hand placement; guarding for safety    Ambulation/Gait Ambulation/Gait assistance: Min assist Gait Distance (Feet): 30 Feet Assistive device: Rolling walker (2 wheels) Gait Pattern/deviations: Step-through pattern, Decreased step length - left   Gait velocity interpretation: 1.31 - 2.62 ft/sec, indicative of limited community ambulator   General Gait Details: pt accustomed to rollator and with difficulty steering/maneuvering RW (required assist in tight spaces);  Stairs            Wheelchair Mobility    Modified Rankin (Stroke Patients Only)       Balance Overall balance assessment: Needs assistance Sitting-balance support: No upper extremity supported, Feet supported Sitting balance-Leahy Scale: Good Sitting balance - Comments: reaches down to pull up socks without imbalance   Standing  balance support: Bilateral upper extremity supported, Reliant on assistive device for balance Standing balance-Leahy Scale:  Poor Standing balance comment: also history of 2 recent falls; unwitnessed and family does not know what happened                             Pertinent Vitals/Pain Pain Assessment Pain Assessment: No/denies pain Faces Pain Scale: No hurt    Home Living Family/patient expects to be discharged to:: Skilled nursing facility                 Home Equipment: Rollator (4 wheels);Shower seat Additional Comments: pt currently in ALF per daughter    Prior Function Prior Level of Function : Needs assist             Mobility Comments: amb with rollator with supervision ADLs Comments: per daugther pt requires supervision and cueing to complete tasks, has noticed a cognitive decline over the last few months;     Hand Dominance        Extremity/Trunk Assessment   Upper Extremity Assessment Upper Extremity Assessment: Generalized weakness    Lower Extremity Assessment Lower Extremity Assessment: LLE deficits/detail;Generalized weakness LLE Deficits / Details: AAROM WFL, pt with no pain with ROM    Cervical / Trunk Assessment Cervical / Trunk Assessment: Kyphotic  Communication   Communication: Expressive difficulties  Cognition Arousal/Alertness: Awake/alert Behavior During Therapy: Impulsive Overall Cognitive Status: Impaired/Different from baseline Area of Impairment: Orientation, Attention, Memory, Following commands, Safety/judgement, Awareness, Problem solving                 Orientation Level: Disoriented to, Place, Time, Situation (daughter reports earlier this a.m. she was oriented to place and situation) Current Attention Level: Sustained Memory: Decreased short-term memory Following Commands: Follows one step commands with increased time Safety/Judgement: Decreased awareness of safety, Decreased awareness of deficits Awareness:  (pre-intellectual) Problem Solving: Difficulty sequencing, Requires verbal cues, Requires tactile cues General  Comments: cognitive deficits at baseline, and daughter reports worsening over the last few months, acutely a significant difference;        General Comments General comments (skin integrity, edema, etc.): Daughter present for evaluation. Lengthy discussion re: pt's prior status, recent status, and ALF vs SNF for follow-up.    Exercises     Assessment/Plan    PT Assessment Patient needs continued PT services  PT Problem List Decreased strength;Decreased activity tolerance;Decreased balance;Decreased mobility;Decreased cognition;Decreased knowledge of use of DME;Decreased safety awareness;Decreased knowledge of precautions       PT Treatment Interventions DME instruction;Gait training;Functional mobility training;Therapeutic activities;Therapeutic exercise;Balance training;Patient/family education    PT Goals (Current goals can be found in the Care Plan section)  Acute Rehab PT Goals Patient Stated Goal: unable to state; daughter wants her to work on walking and balance PT Goal Formulation: With family Time For Goal Achievement: 11/15/22 Potential to Achieve Goals: Good    Frequency Min 3X/week     Co-evaluation               AM-PAC PT "6 Clicks" Mobility  Outcome Measure Help needed turning from your back to your side while in a flat bed without using bedrails?: A Little Help needed moving from lying on your back to sitting on the side of a flat bed without using bedrails?: A Little Help needed moving to and from a bed to a chair (including a wheelchair)?: A Little Help needed standing up from a chair using your  arms (e.g., wheelchair or bedside chair)?: A Little Help needed to walk in hospital room?: A Little Help needed climbing 3-5 steps with a railing? : A Lot 6 Click Score: 17    End of Session Equipment Utilized During Treatment: Gait belt Activity Tolerance: Patient tolerated treatment well Patient left: in bed;with call bell/phone within reach;with bed alarm  set;with family/visitor present;with SCD's reapplied Nurse Communication: Mobility status PT Visit Diagnosis: Repeated falls (R29.6);Muscle weakness (generalized) (M62.81)    Time: 0923-3007 PT Time Calculation (min) (ACUTE ONLY): 39 min   Charges:   PT Evaluation $PT Eval Low Complexity: 1 Low PT Treatments $Gait Training: 8-22 mins $Self Care/Home Management: 8-22         Jerolyn Center, PT Acute Rehabilitation Services  Office [phone]   Zena Amos 11/01/2022, 12:16 PM

## 2022-11-01 NOTE — Plan of Care (Signed)
  Problem: Education: Goal: Knowledge of General Education information will improve Description: Including pain rating scale, medication(s)/side effects and non-pharmacologic comfort measures Outcome: Not Progressing   Problem: Health Behavior/Discharge Planning: Goal: Ability to manage health-related needs will improve Outcome: Not Progressing   Problem: Clinical Measurements: Goal: Ability to maintain clinical measurements within normal limits will improve Outcome: Progressing Goal: Will remain free from infection Outcome: Progressing   Problem: Activity: Goal: Risk for activity intolerance will decrease Outcome: Progressing   Problem: Nutrition: Goal: Adequate nutrition will be maintained Outcome: Progressing   Problem: Coping: Goal: Level of anxiety will decrease Outcome: Not Progressing   Problem: Elimination: Goal: Will not experience complications related to bowel motility Outcome: Progressing Goal: Will not experience complications related to urinary retention Outcome: Progressing   Problem: Pain Managment: Goal: General experience of comfort will improve Outcome: Progressing   Problem: Safety: Goal: Ability to remain free from injury will improve Outcome: Not Progressing

## 2022-11-02 ENCOUNTER — Inpatient Hospital Stay (HOSPITAL_COMMUNITY): Payer: Medicare Other

## 2022-11-02 DIAGNOSIS — I10 Essential (primary) hypertension: Secondary | ICD-10-CM | POA: Diagnosis not present

## 2022-11-02 DIAGNOSIS — D62 Acute posthemorrhagic anemia: Secondary | ICD-10-CM | POA: Diagnosis not present

## 2022-11-02 DIAGNOSIS — I499 Cardiac arrhythmia, unspecified: Secondary | ICD-10-CM | POA: Diagnosis not present

## 2022-11-02 DIAGNOSIS — S72002A Fracture of unspecified part of neck of left femur, initial encounter for closed fracture: Secondary | ICD-10-CM | POA: Diagnosis not present

## 2022-11-02 DIAGNOSIS — I358 Other nonrheumatic aortic valve disorders: Secondary | ICD-10-CM

## 2022-11-02 LAB — BASIC METABOLIC PANEL
Anion gap: 10 (ref 5–15)
BUN: 18 mg/dL (ref 8–23)
CO2: 23 mmol/L (ref 22–32)
Calcium: 8.5 mg/dL — ABNORMAL LOW (ref 8.9–10.3)
Chloride: 104 mmol/L (ref 98–111)
Creatinine, Ser: 0.88 mg/dL (ref 0.44–1.00)
GFR, Estimated: >60 mL/min (ref >60)
Glucose, Bld: 154 mg/dL — ABNORMAL HIGH (ref 70–99)
Potassium: 3.6 mmol/L (ref 3.5–5.1)
Sodium: 137 mmol/L (ref 135–145)

## 2022-11-02 LAB — ECHOCARDIOGRAM COMPLETE
AR max vel: 1.24 cm2
AV Area VTI: 1.26 cm2
AV Area mean vel: 1.27 cm2
AV Mean grad: 12 mmHg
AV Peak grad: 23.6 mmHg
Ao pk vel: 2.43 m/s
Area-P 1/2: 3.2 cm2
Height: 69 in
MV VTI: 1.67 cm2
Weight: 2504.43 oz

## 2022-11-02 LAB — CBC
HCT: 25.1 % — ABNORMAL LOW (ref 36.0–46.0)
Hemoglobin: 8.6 g/dL — ABNORMAL LOW (ref 12.0–15.0)
MCH: 31 pg (ref 26.0–34.0)
MCHC: 34.3 g/dL (ref 30.0–36.0)
MCV: 90.6 fL (ref 80.0–100.0)
Platelets: 185 10*3/uL (ref 150–400)
RBC: 2.77 MIL/uL — ABNORMAL LOW (ref 3.87–5.11)
RDW: 13.2 % (ref 11.5–15.5)
WBC: 8.3 10*3/uL (ref 4.0–10.5)
nRBC: 0 % (ref 0.0–0.2)

## 2022-11-02 LAB — MAGNESIUM: Magnesium: 2.4 mg/dL (ref 1.7–2.4)

## 2022-11-02 MED ORDER — LOSARTAN POTASSIUM 50 MG PO TABS
100.0000 mg | ORAL_TABLET | Freq: Every day | ORAL | Status: DC
  Administered 2022-11-02 – 2022-11-08 (×7): 100 mg via ORAL
  Filled 2022-11-02 (×7): qty 2

## 2022-11-02 MED ORDER — QUETIAPINE FUMARATE 25 MG PO TABS
25.0000 mg | ORAL_TABLET | Freq: Every day | ORAL | Status: DC
  Administered 2022-11-02 – 2022-11-07 (×6): 25 mg via ORAL
  Filled 2022-11-02 (×6): qty 1

## 2022-11-02 MED ORDER — ENSURE ENLIVE PO LIQD
237.0000 mL | Freq: Three times a day (TID) | ORAL | Status: DC
  Administered 2022-11-02 – 2022-11-08 (×16): 237 mL via ORAL

## 2022-11-02 MED ORDER — POTASSIUM CHLORIDE CRYS ER 20 MEQ PO TBCR
40.0000 meq | EXTENDED_RELEASE_TABLET | Freq: Once | ORAL | Status: AC
  Administered 2022-11-02: 40 meq via ORAL
  Filled 2022-11-02: qty 2

## 2022-11-02 NOTE — Care Management Important Message (Signed)
Important Message  Patient Details  Name: [name] MRN: [id number] Date of Birth: Jul 28, 1934   Medicare Important Message Given:  Yes     Murphy Duzan 11/02/2022, 2:14 PM

## 2022-11-02 NOTE — Progress Notes (Signed)
Physical Therapy Treatment Patient Details Name: [name] MRN: [id number] DOB: Nov 15, 1933 Today's Date: 11/02/2022   History of Present Illness 89 y/o female presented to ED on 10/30/22 for evaluation of left hip pain, recurrent left-sided weakness, and expressive aphasia. She had 2 falls in 24 hours. CT head negative; unable to have MRI due to pacemaker; +left femoral neck fx, s/p Left total hip replacement through an anterior approach 10/31/22;  PMH: AAA, CAD s/p CABG, HTN, asthma, dementia, CKD, pacemaker, CVA, CHF, AVR, afib not on anticoagulation,    PT Comments    Patient sleeping on arrival and easily awakened. Stated she's not feeling well today, but unable to elaborate. Persuaded to get up and take a walk. Patient needing slightly more assist with transfers today. Did not want to sit up in chair; only wanted to lie back down.     Recommendations for follow up therapy are one component of a multi-disciplinary discharge planning process, led by the attending physician.  Recommendations may be updated based on patient status, additional functional criteria and insurance authorization.  Follow Up Recommendations  Skilled nursing-short term rehab (<3 hours/day) Can patient physically be transported by private vehicle: Yes   Assistance Recommended at Discharge Frequent or constant Supervision/Assistance  Patient can return home with the following A little help with walking and/or transfers;A little help with bathing/dressing/bathroom;Direct supervision/assist for medications management;Direct supervision/assist for financial management   Equipment Recommendations  None recommended by PT    Recommendations for Other Services       Precautions / Restrictions Precautions Precautions: Fall Restrictions Weight Bearing Restrictions: No LLE Weight Bearing: Weight bearing as tolerated     Mobility  Bed Mobility Overal bed mobility: Needs Assistance Bed Mobility: Supine to Sit,  Sit to Supine     Supine to sit: Min guard Sit to supine: Min guard   General bed mobility comments: incr time and effort, but no physical assist    Transfers Overall transfer level: Needs assistance Equipment used: Rolling walker (2 wheels) Transfers: Sit to/from Stand Sit to Stand: Min assist           General transfer comment: cues for hand placement; boosting assist    Ambulation/Gait Ambulation/Gait assistance: Min assist Gait Distance (Feet): 35 Feet Assistive device: Rolling walker (2 wheels) Gait Pattern/deviations: Step-through pattern, Decreased step length - left   Gait velocity interpretation: 1.31 - 2.62 ft/sec, indicative of limited community ambulator   General Gait Details: running into objects in tight spaces; assist with turning RW when returning to her room (pt accustomed to rollator)   Chief Strategy Officer    Modified Rankin (Stroke Patients Only)       Balance Overall balance assessment: Needs assistance Sitting-balance support: No upper extremity supported, Feet supported Sitting balance-Leahy Scale: Good Sitting balance - Comments: reaches down to pull up socks without imbalance   Standing balance support: Bilateral upper extremity supported, Reliant on assistive device for balance Standing balance-Leahy Scale: Poor Standing balance comment: also history of 2 recent falls; unwitnessed and family does not know what happened                            Cognition Arousal/Alertness: Awake/alert Behavior During Therapy: Flat affect Overall Cognitive Status: Impaired/Different from baseline Area of Impairment: Attention, Memory, Following commands, Safety/judgement, Awareness, Problem solving  Current Attention Level: Sustained Memory: Decreased short-term memory Following Commands: Follows one step commands with increased time Safety/Judgement: Decreased awareness of safety,  Decreased awareness of deficits Awareness:  (pre-intellectual) Problem Solving: Difficulty sequencing, Requires verbal cues, Requires tactile cues General Comments: cognitive deficits at baseline, and daughter reports worsening over the last few months, acutely a significant difference;        Exercises General Exercises - Lower Extremity Heel Slides: AAROM, Left, 5 reps (no indication of pain)    General Comments General comments (skin integrity, edema, etc.): Daughter present and reports pt with first mention of pain earlier today and RN gave pt dilaudid. Pt has slept most of the day      Pertinent Vitals/Pain Pain Assessment Pain Assessment: Faces Faces Pain Scale: Hurts a little bit Pain Location: leg per pt Pain Descriptors / Indicators:  (unable) Pain Intervention(s): Limited activity within patient's tolerance, Monitored during session, Premedicated before session    Home Living                          Prior Function            PT Goals (current goals can now be found in the care plan section) Acute Rehab PT Goals Patient Stated Goal: unable to state; daughter wants her to work on walking and balance Time For Goal Achievement: 11/15/22 Potential to Achieve Goals: Good Progress towards PT goals: Progressing toward goals    Frequency    Min 3X/week      PT Plan Current plan remains appropriate    Co-evaluation              AM-PAC PT "6 Clicks" Mobility   Outcome Measure  Help needed turning from your back to your side while in a flat bed without using bedrails?: A Little Help needed moving from lying on your back to sitting on the side of a flat bed without using bedrails?: A Little Help needed moving to and from a bed to a chair (including a wheelchair)?: A Little Help needed standing up from a chair using your arms (e.g., wheelchair or bedside chair)?: A Little Help needed to walk in hospital room?: A Little Help needed climbing 3-5  steps with a railing? : A Lot 6 Click Score: 17    End of Session Equipment Utilized During Treatment: Gait belt Activity Tolerance: Patient tolerated treatment well Patient left: in bed;with call bell/phone within reach;with bed alarm set;with family/visitor present Nurse Communication: Mobility status PT Visit Diagnosis: Repeated falls (R29.6);Muscle weakness (generalized) (M62.81)     Time: 5093-2671 PT Time Calculation (min) (ACUTE ONLY): 17 min  Charges:  $Gait Training: 8-22 mins                      Jerolyn Center, PT Acute Rehabilitation Services  Office [phone]    Zena Amos 11/02/2022, 3:32 PM

## 2022-11-02 NOTE — Progress Notes (Signed)
  Echocardiogram 2D Echocardiogram has been performed.  [name] 11/02/2022, 2:56 PM

## 2022-11-02 NOTE — Progress Notes (Signed)
[facility] PROGRESS NOTE   [name] [id number] DOB: Jun 05, 1934 DOA: 10/30/2022  PCP: Pleas Koch, NP  Brief History/Interval Summary: 89 y.o. female with medical history significant for Alzheimer's dementia, hypertension, hyperlipidemia, asthma, CAD status post CABG, chronic HFpEF, stroke, hypothyroidism, CKD stage IIIa, LBBB, history of aortic valve replacement, mitral valve prolapse/mitral regurgitation, paroxysmal atrial tachycardia/A-fib not on anticoagulation per cardiology due to extremely low burden of arrhythmia, has a pacemaker, AAA, PAD.  Recently admitted 12/28-12/29 for acute metabolic encephalopathy secondary to COVID-19 infection.  Patient was admitted due to left lower extremity weakness and concern for CVA but neurology felt this was less likely.  CT head negative for acute finding.  Brain MRI could not be done due to presence of a pacemaker.  Chest x-ray was showing an infiltrate and she was treated with Paxlovid.  She was not hypoxic. Patient presented to [facility] ED again for evaluation of left hip pain, recurrent left-sided weakness, and expressive aphasia.  Found to have a left hip fracture. ED physician spoke to Dr. Quinn Axe with neurology who recommended brain MRI for further evaluation could not be done at [place] patient has a pacemaker.  Case was discussed with radiology and ED physician was told that MRI could be done at [facility].  ED physician consulted Dr. Tamera Punt with orthopedics who agrees to see the patient in consultation at [facility].  Patient was transferred to [facility] for further evaluation.   Consultants: Cardiology.  Orthopedics.  Neurology  Procedures:  Left total hip replacement using anterior approach, 1/7 Echocardiogram reordered by cardiology and is pending.    Subjective/Interval History: Overnight events noted.  Was quite agitated.  Seems to be pleasantly confused this morning.  Daughter is at the bedside.  Patient  denies any chest pain shortness of breath.  No pain in the left leg.     Assessment/Plan:  Recurrent strokelike symptoms Patient presented with left-sided weakness facial droop and expressive aphasia CT head was negative for acute findings.  Patient had recent hospitalization for similar complaints.  She was admitted to [facility].  She was seen by neurology at that time.  They felt that her presentation was most likely secondary to exacerbation of pre-existing deficits due to COVID infection.  Stroke thought to be less likely. Due to recurrent symptoms patient was thought to require MRI.  She has a pacemaker.  She was transferred to [facility].  Informed by radiology team that the pacemaker is unsafe for MRI. Patient seen by neurology.  EEG was ordered which is negative for epileptiform activity.  Neurology does not recommend any further investigations. Patient noted to be on aspirin and statin. LDL was 76 on December 29.  HbA1c 6.0 on December 29. No recent echocardiogram in our system.  Last 1 was from 2020 which showed normal systolic function.  No significant valvular abnormalities were noted.  Hospital stay delirium in the setting of known dementia Noted to be delirious last night.  Seems to be stable this morning.  Electrolytes are normal. Seroquel might help with her agitation.  Nightly dose will be ordered.  QT interval was normal on last EKG.  Left hip fracture Seen by orthopedics.  Underwent surgery on 1/7.  PT and OT evaluation.  Normocytic anemia/acute blood loss anemia postoperatively Drop in hemoglobin is likely due to hip fracture and surgery.  Hemoglobin noted to be 8.6 today.  Hopefully this will plateau soon.  Will recheck tomorrow.   Transfuse for hemoglobin less than  8 considering her significant cardiac disease.  Unspecified arrhythmia Pacemaker interrogation does not reveal any evidence for atrial fibrillation.  She does have episodes of atrial tachycardia PACs and PVCs.   No indication for anticoagulation per cardiology.  Patient not allowing telemetry leads to stay on.  No concerning arrhythmias noted.  Will discontinue telemetry.  Pacemaker in situ Stable.  Pacemaker was interrogated by cardiology.  Coronary artery disease status post CABG Stable from a cardiac standpoint.  Continue aspirin and statin. Cardiology has ordered echocardiogram which is pending.  Hypokalemia Supplement potassium.  Magnesium is 2.4.  Recent COVID-19 infection Looks like she completed course of Paxlovid.  No respiratory symptoms currently.  She was tested on 12/28.   Patient has completed 10 days of isolation.  Came off isolation on 1/8.    Chronic's diastolic CHF Stable.  Euvolemic.  History of Alzheimer's dementia Stable.  Essential hypertension Blood pressure noted to be stable for the most part.  Not on any blood pressure lowering agents currently.   Blood pressure is noted to be rising.  Will resume her Cozaar.  Hypothyroidism Levothyroxine  DVT Prophylaxis: SCDs.  Aspirin twice a day as per orthopedics Code Status: DNR Family Communication: Discussed with daughter at the bedside Disposition Plan: SNF when medically stable.  Hopefully in 24 to 48 hours if hemoglobin remains stable and if her behavioral issues resolve.    Status is: Inpatient Remains inpatient appropriate because: Left hip fracture    Medications: Scheduled:  aspirin  81 mg Oral BID   atorvastatin  40 mg Oral Daily   brimonidine  1 drop Left Eye BID   Chlorhexidine Gluconate Cloth  6 each Topical Daily   docusate sodium  100 mg Oral BID   levothyroxine  50 mcg Oral Q0600   polyethylene glycol  17 g Oral BID   QUEtiapine  25 mg Oral QHS   Continuous:  sodium chloride 75 mL/hr at 11/01/22 1637   methocarbamol (ROBAXIN) IV     GLO:VFIEPPIRJ, diphenhydrAMINE, HYDROmorphone (DILAUDID) injection, HYDROmorphone, menthol-cetylpyridinium **OR** phenol, methocarbamol **OR** methocarbamol  (ROBAXIN) IV, metoCLOPramide **OR** metoCLOPramide (REGLAN) injection, ondansetron **OR** ondansetron (ZOFRAN) IV  Antibiotics: Anti-infectives (From admission, onward)    Start     Dose/Rate Route Frequency Ordered Stop   10/31/22 1230  ceFAZolin (ANCEF) IVPB 2g/100 mL premix        2 g 200 mL/hr over 30 Minutes Intravenous Every 6 hours 10/31/22 1137 10/31/22 1838   10/31/22 0600  ceFAZolin (ANCEF) IVPB 2g/100 mL premix        2 g 200 mL/hr over 30 Minutes Intravenous On call to O.R. 10/30/22 1832 10/31/22 0725       Objective:  Vital Signs  Vitals:   11/01/22 0941 11/01/22 1122 11/01/22 1650 11/02/22 0833  BP:  131/70 (!) 152/65 (!) 167/78  Pulse:  82 68 70  Resp:    18  Temp:  98.8 F (37.1 C) 98.1 F (36.7 C) 98.4 F (36.9 C)  TempSrc:  Oral Oral Oral  SpO2:  95% 95% 97%  Weight: 71 kg     Height: 5\' 9"  (1.753 m)       Intake/Output Summary (Last 24 hours) at 11/02/2022 0927 Last data filed at 11/02/2022 0400 Gross per 24 hour  Intake 1850.3 ml  Output 1275 ml  Net 575.3 ml    Filed Weights   11/01/22 0941  Weight: 71 kg    General appearance: Awake alert.  In no distress.  Distracted. Resp: Clear to  auscultation bilaterally.  Normal effort Cardio: S1-S2 is normal regular.  No S3-S4.  No rubs murmurs or bruit GI: Abdomen is soft.  Nontender nondistended.  Bowel sounds are present normal.  No masses organomegaly Extremities: No bruising noted over the left thigh area. No obvious focal neurological deficits.   Lab Results:  Data Reviewed: I have personally reviewed following labs and reports of the imaging studies  CBC: Recent Labs  Lab 10/29/22 1436 10/30/22 1108 11/01/22 0418 11/02/22 0715  WBC 13.2* 7.6 11.6* 8.3  NEUTROABS 11.8*  --   --   --   HGB 15.0 12.9 9.3* 8.6*  HCT 43.4 36.6 26.5* 25.1*  MCV 88.6 89.1 89.8 90.6  PLT 240 181 162 185     Basic Metabolic Panel: Recent Labs  Lab 10/29/22 1436 10/30/22 1108 11/01/22 0418  11/02/22 0715  NA 135 134* 132* 137  K 2.9* 3.4* 3.6 3.6  CL 95* 99 101 104  CO2 25 23 20* 23  GLUCOSE 136* 148* 169* 154*  BUN 15 12 17 18   CREATININE 0.99 1.07* 1.17* 0.88  CALCIUM 9.1 8.0* 8.0* 8.5*  MG  --  1.7 1.8 2.4     GFR: Estimated Creatinine Clearance: 48 mL/min (by C-G formula based on SCr of 0.88 mg/dL).  Liver Function Tests: Recent Labs  Lab 10/29/22 1436 10/30/22 1108 11/01/22 0418  AST 44* 33 27  ALT 46* 44 19  ALKPHOS 80 62 48  BILITOT 1.4* 1.1 1.0  PROT 7.6 5.9* 5.0*  ALBUMIN 4.3 3.2* 2.7*     Recent Labs  Lab 10/29/22 1436  LIPASE 30     Coagulation Profile: Recent Labs  Lab 10/29/22 1436  INR 1.0      Recent Results (from the past 240 hour(s))  Surgical pcr screen     Status: None   Collection Time: 10/31/22  4:54 AM   Specimen: Nasal Mucosa; Nasal Swab  Result Value Ref Range Status   MRSA, PCR NEGATIVE NEGATIVE Final   Staphylococcus aureus NEGATIVE NEGATIVE Final    Comment: (NOTE) The Xpert SA Assay (FDA approved for NASAL specimens in patients 57 years of age and older), is one component of a comprehensive surveillance program. It is not intended to diagnose infection nor to guide or monitor treatment. Performed at [facility], [address], [place], [place]        Radiology Studies: No results found.     LOS: 3 days   Kleo Dungee [email or web address] on [email or web address]  11/02/2022, 9:27 AM

## 2022-11-02 NOTE — Progress Notes (Signed)
Patient has partially removed surgical dressing to left hip. It has been reinforced. Patient continues to be unsafe and very impulsive. However, she is no longer combative and much more cooperative at this time. Safety sitter order for today for assistance in safety plan.

## 2022-11-02 NOTE — Progress Notes (Signed)
Sitter no longer at bedside. Bed alarm went off and patient was again attempting to get out of bed unassisted. She became very physically and verbally aggressive with nursing staff attempting to assist her. She removed her telemetry monitor and swung it at the nurse. She attempted to punch and scratch staff while we attempted to keep her from falling. At this time she is not on telemetry and her IV fluids have been disconnected while she is in this state. CCMD made aware of patient situation and night time provider made aware.

## 2022-11-02 NOTE — [place] FL2 (Signed)
[place] MEDICAID FL2 LEVEL OF CARE FORM     IDENTIFICATION  Patient Name: [name] Birthdate: 1933-12-21 Sex: female Admission Date (Current Location): 10/30/2022  [facility] and [place] Number:  Producer, television/film/video and Address:  [facility], [address], [place], [place] [address]      Provider Number: [id number]  Attending Physician Name and Address:  Osvaldo Shipper, MD  Relative Name and Phone Number:  Maylon Cos Daughter [phone]    Current Level of Care: Hospital Recommended Level of Care: Skilled Nursing Facility Prior Approval Number:    Date Approved/Denied:   PASRR Number:    Discharge Plan: SNF    Current Diagnoses: Patient Active Problem List   Diagnosis Date Noted   S/P total left hip arthroplasty 10/31/2022   Stroke-like symptoms 10/30/2022   Hip fracture (HCC) 10/30/2022   Arrhythmia 10/30/2022   Closed displaced fracture of left femoral neck (HCC) 10/29/2022   CVA (cerebral vascular accident) (HCC) 10/21/2022   Acute metabolic encephalopathy 10/21/2022   Stroke (HCC) 10/21/2022   Chronic kidney disease, stage 3a (HCC) 10/21/2022   HLD (hyperlipidemia) 10/21/2022   Asthma 10/21/2022   COVID-19 virus infection 10/21/2022   Myocardial injury 10/21/2022   Pancreatic lesion 10/21/2022   Slurred speech 10/20/2022   Acute cough 10/20/2022   Osteoarthritis of left hip 03/02/2022   Dizziness 06/26/2019   Skin lesion 06/26/2019   CKD (chronic kidney disease) stage 3, GFR 30-59 ml/min (HCC) 12/27/2018   Actinic keratosis 10/26/2018   Primary osteoarthritis of right hip 07/31/2018   Preventative health care 06/20/2018   Acute hip pain, bilateral 05/17/2018   Altered gait 12/05/2017   Neuropathic pain 12/05/2017   Medicare annual wellness visit, subsequent 06/03/2017   Pacemaker 01/15/2017   Major depressive disorder 11/15/2016   Urinary incontinence 11/15/2016   Mild persistent asthma 11/15/2016    Osteoporosis 11/15/2016   PAT (paroxysmal atrial tachycardia) (HCC) 01/21/2016   Second degree AV block, Mobitz type II 11/05/2014   Sinus pause 10/31/2014   Mobitz type 2 second degree AV block 10/31/2014   Hypokalemia 08/17/2013   AAA 04/02/2013   S/P AVR 2009 - Edwards Magna Ease 21 mm bovine pericardial 04/02/2013   CAD (coronary artery disease) s/p CABG - 2009 (LIMA to LAD, SVG to LCX, SVG to L-PLV 04/02/2013   HTN (hypertension) 04/02/2013   Hyperlipidemia 04/02/2013   MVP (mitral valve prolapse) with mild mitral insufficiency 04/02/2013   Dementia (HCC) 04/02/2013   Hypothyroidism 04/02/2013   Bradycardia 04/02/2013    Orientation RESPIRATION BLADDER Height & Weight     Self  Normal Continent Weight: 156 lb 8.4 oz (71 kg) Height:  5\' 9"  (175.3 cm)  BEHAVIORAL SYMPTOMS/MOOD NEUROLOGICAL BOWEL NUTRITION STATUS      Continent Diet (See d/c summary)  AMBULATORY STATUS COMMUNICATION OF NEEDS Skin   Extensive Assist Verbally Normal                       Personal Care Assistance Level of Assistance  Bathing, Feeding, Dressing Bathing Assistance: Limited assistance Feeding assistance: Limited assistance Dressing Assistance: Limited assistance     Functional Limitations Info  Sight, Hearing, Speech Sight Info: Adequate Hearing Info: Adequate Speech Info: Adequate    SPECIAL CARE FACTORS FREQUENCY  PT (By licensed PT), OT (By licensed OT)     PT Frequency: 5x/wk OT Frequency: 5x/wk            Contractures Contractures Info: Not present  Additional Factors Info  Code Status, Allergies Code Status Info: DNR Allergies Info: Reaction Severity Reaction Type Noted  Adverse Reactions/Drug Intolerances     Codeine   Nausea And Vomiting, Other (See Comments)  Medium  Intolerance  04/21/2012  Patient gets violently ill  Morphine And Related   Nausea And Vomiting, Other (See Comments)  Medium  Intolerance  04/21/2012           Current Medications (11/02/2022):   This is the current hospital active medication list Current Facility-Administered Medications  Medication Dose Route Frequency Provider Last Rate Last Admin   aspirin chewable tablet 81 mg  81 mg Oral BID Irving Copas, PA-C   81 mg at 11/02/22 0931   atorvastatin (LIPITOR) tablet 40 mg  40 mg Oral Daily Irving Copas, PA-C   40 mg at 11/02/22 0930   bisacodyl (DULCOLAX) suppository 10 mg  10 mg Rectal Daily PRN Irving Copas, PA-C       brimonidine (ALPHAGAN) 0.2 % ophthalmic solution 1 drop  1 drop Left Eye BID Irving Copas, PA-C   1 drop at 11/02/22 0930   Chlorhexidine Gluconate Cloth 2 % PADS 6 each  6 each Topical Daily Irving Copas, PA-C   6 each at 11/02/22 0931   diphenhydrAMINE (BENADRYL) 12.5 MG/5ML elixir 12.5-25 mg  12.5-25 mg Oral Q4H PRN Irving Copas, PA-C       docusate sodium (COLACE) capsule 100 mg  100 mg Oral BID Irving Copas, PA-C   100 mg at 11/02/22 0930   HYDROmorphone (DILAUDID) injection 0.5-1 mg  0.5-1 mg Intravenous Q4H PRN Irving Copas, PA-C       HYDROmorphone (DILAUDID) tablet 1-2 mg  1-2 mg Oral Q4H PRN Irving Copas, PA-C   1 mg at 11/02/22 1111   levothyroxine (SYNTHROID) tablet 50 mcg  50 mcg Oral Q0600 Irving Copas, PA-C   50 mcg at 11/01/22 0533   losartan (COZAAR) tablet 100 mg  100 mg Oral Daily Bonnielee Haff, MD   100 mg at 11/02/22 1041   menthol-cetylpyridinium (CEPACOL) lozenge 3 mg  1 lozenge Oral PRN Irving Copas, PA-C       Or   phenol (CHLORASEPTIC) mouth spray 1 spray  1 spray Mouth/Throat PRN Irving Copas, PA-C       methocarbamol (ROBAXIN) tablet 500 mg  500 mg Oral Q6H PRN Irving Copas, PA-C       Or   methocarbamol (ROBAXIN) 500 mg in dextrose 5 % 50 mL IVPB  500 mg Intravenous Q6H PRN Irving Copas, PA-C       metoCLOPramide (REGLAN) tablet 5-10 mg  5-10 mg Oral Q8H PRN Irving Copas, PA-C       Or   metoCLOPramide (REGLAN) injection 5-10 mg  5-10 mg Intravenous Q8H PRN  Irving Copas, PA-C       ondansetron [facility]) tablet 4 mg  4 mg Oral Q6H PRN Irving Copas, PA-C       Or   ondansetron [facility]) injection 4 mg  4 mg Intravenous Q6H PRN Costella Hatcher R, PA-C       polyethylene glycol (MIRALAX / GLYCOLAX) packet 17 g  17 g Oral BID Irving Copas, PA-C   17 g at 11/02/22 5176   QUEtiapine (SEROQUEL) tablet 25 mg  25 mg Oral QHS Bonnielee Haff, MD         Discharge Medications: Please see discharge summary for a  list of discharge medications.  Relevant Imaging Results:  Relevant Lab Results:   Additional Information    Tory Emerald, LCSW

## 2022-11-02 NOTE — Progress Notes (Incomplete)
Patient's daughter and son n law at bedside during and after shift change. The patient was very agitated and was not redirectable by her daughter. She almost became combative when her daughter attempted to assist her with mobility. She was a much more receptive

## 2022-11-02 NOTE — Progress Notes (Addendum)
After several hours of patient getting in and out of bed every 15-20 minutes she is finally asleep. She was 1:1 the first 6 hours of the shift due to her confusion and multiple attempts to get up unassisted. The on-call provider was contacted and a safety sitter request was made due to fall risk and risk for injury to her surgical hip which she is only at post op day 2.

## 2022-11-02 NOTE — TOC Initial Note (Signed)
Transition of Care [facility]) - Initial/Assessment Note    Patient Details  Name: [name] MRN: [id number] Date of Birth: 1934/10/21  [facility]) CM/SW Contact:    Tory Emerald, LCSW Phone Number: 11/02/2022, 12:23 PM  Clinical Narrative:                 CSW met with pt and her dtr at bedside to complete assessment. Pt alert and oriented to person, minus time and place. CSW explained PT recommendations and pts dtr is on board. CSW will complete w/u and fax out.  Expected Discharge Plan: Skilled Nursing Facility     Patient Goals and CMS Choice   CMS Medicare.gov Compare Post Acute Care list provided to:: Patient Represenative (must comment) [name], pt's dtr) Choice offered to / list presented to : Sibling      Expected Discharge Plan and Services       Living arrangements for the past 2 months: Assisted [facility] Facility ([facility]                                      Prior Living Arrangements/Services Living arrangements for the past 2 months: Assisted Living Facility ([facility])   Patient language and need for interpreter reviewed:: Yes Do you feel safe going back to the place where you live?: Yes      Need for Family Participation in Patient Care: Yes (Comment) Care giver support system in place?: Yes (comment)   Criminal Activity/Legal Involvement Pertinent to Current Situation/Hospitalization: No - Comment as needed  Activities of Daily Living Home Assistive Devices/Equipment: Walker (specify type) ADL Screening (condition at time of admission) Patient's cognitive ability adequate to safely complete daily activities?: No Is the patient deaf or have difficulty hearing?: Yes Does the patient have difficulty seeing, even when wearing glasses/contacts?: No Does the patient have difficulty concentrating, remembering, or making decisions?: Yes Patient able to express need for assistance with ADLs?: No Does the patient have difficulty  dressing or bathing?: Yes Independently performs ADLs?: No Communication: Needs assistance Is this a change from baseline?: Pre-admission baseline Dressing (OT): Needs assistance Is this a change from baseline?: Pre-admission baseline Grooming: Needs assistance Is this a change from baseline?: Pre-admission baseline Feeding: Needs assistance Is this a change from baseline?: Pre-admission baseline Bathing: Needs assistance Is this a change from baseline?: Pre-admission baseline Toileting: Needs assistance Is this a change from baseline?: Pre-admission baseline In/Out Bed: Needs assistance Is this a change from baseline?: Pre-admission baseline Walks in Home: Needs assistance Is this a change from baseline?: Pre-admission baseline Does the patient have difficulty walking or climbing stairs?: Yes Weakness of Legs: Both Weakness of Arms/Hands: None  Permission Sought/Granted                  Emotional Assessment Appearance:: Appears stated age       Alcohol / Substance Use: Not Applicable Psych Involvement: No (comment)  Admission diagnosis:  Stroke-like symptoms [R29.90] Patient Active Problem List   Diagnosis Date Noted   S/P total left hip arthroplasty 10/31/2022   Stroke-like symptoms 10/30/2022   Hip fracture (HCC) 10/30/2022   Arrhythmia 10/30/2022   Closed displaced fracture of left femoral neck (HCC) 10/29/2022   CVA (cerebral vascular accident) (HCC) 10/21/2022   Acute metabolic encephalopathy 10/21/2022   Stroke (HCC) 10/21/2022   Chronic kidney disease, stage 3a (HCC) 10/21/2022   HLD (hyperlipidemia)  10/21/2022   Asthma 10/21/2022   COVID-19 virus infection 10/21/2022   Myocardial injury 10/21/2022   Pancreatic lesion 10/21/2022   Slurred speech 10/20/2022   Acute cough 10/20/2022   Osteoarthritis of left hip 03/02/2022   Dizziness 06/26/2019   Skin lesion 06/26/2019   CKD (chronic kidney disease) stage 3, GFR 30-59 ml/min (HCC) 12/27/2018    Actinic keratosis 10/26/2018   Primary osteoarthritis of right hip 07/31/2018   Preventative health care 06/20/2018   Acute hip pain, bilateral 05/17/2018   Altered gait 12/05/2017   Neuropathic pain 12/05/2017   Medicare annual wellness visit, subsequent 06/03/2017   Pacemaker 01/15/2017   Major depressive disorder 11/15/2016   Urinary incontinence 11/15/2016   Mild persistent asthma 11/15/2016   Osteoporosis 11/15/2016   PAT (paroxysmal atrial tachycardia) ([place]) 01/21/2016   Second degree AV block, Mobitz type II 11/05/2014   Sinus pause 10/31/2014   Mobitz type 2 second degree AV block 10/31/2014   Hypokalemia 08/17/2013   AAA 04/02/2013   S/P AVR 2009 - Edwards Magna Ease 21 mm bovine pericardial 04/02/2013   CAD (coronary artery disease) s/p CABG - 2009 (LIMA to LAD, SVG to LCX, SVG to L-PLV 04/02/2013   HTN (hypertension) 04/02/2013   Hyperlipidemia 04/02/2013   MVP (mitral valve prolapse) with mild mitral insufficiency 04/02/2013   Dementia ([place]) 04/02/2013   Hypothyroidism 04/02/2013   Bradycardia 04/02/2013   PCP:  Pleas Koch, NP Pharmacy:   [facility], [place] [place] [address] Phone: [phone] Fax: [phone]     Social Determinants of Health (SDOH) Social History: [place]: No Food Insecurity (10/30/2022)  Housing: Low Risk  (10/30/2022)  Transportation Needs: No Transportation Needs (10/30/2022)  Utilities: Not At Risk (10/30/2022)  Depression (PHQ2-9): Low Risk  (09/09/2021)  Financial Resource Strain: Low Risk  (06/26/2019)  Physical Activity: Inactive (06/26/2019)  Stress: No Stress Concern Present (06/26/2019)  Tobacco Use: Low Risk  (11/01/2022)   SDOH Interventions:     Readmission Risk Interventions     No data to display

## 2022-11-02 NOTE — Progress Notes (Signed)
re: [name]  Date of birth: 1934-08-16 Date: 11/02/2021  TO WHOM IT MAY CONCERN:  Please be advised that the above named patient will require a short term nursing home stay, anticipated 30 days or less for rehabilitation and strengthening. The plan is for return home.

## 2022-11-02 NOTE — Progress Notes (Signed)
Patient's daughter just arrived for today. She has been updated on the patient's night. Patient seems to be pleased to see her daughter this morning.

## 2022-11-03 DIAGNOSIS — I10 Essential (primary) hypertension: Secondary | ICD-10-CM | POA: Diagnosis not present

## 2022-11-03 DIAGNOSIS — I499 Cardiac arrhythmia, unspecified: Secondary | ICD-10-CM | POA: Diagnosis not present

## 2022-11-03 DIAGNOSIS — D62 Acute posthemorrhagic anemia: Secondary | ICD-10-CM | POA: Diagnosis not present

## 2022-11-03 DIAGNOSIS — S72002A Fracture of unspecified part of neck of left femur, initial encounter for closed fracture: Secondary | ICD-10-CM | POA: Diagnosis not present

## 2022-11-03 LAB — BASIC METABOLIC PANEL
Anion gap: 9 (ref 5–15)
BUN: 24 mg/dL — ABNORMAL HIGH (ref 8–23)
CO2: 24 mmol/L (ref 22–32)
Calcium: 8.5 mg/dL — ABNORMAL LOW (ref 8.9–10.3)
Chloride: 106 mmol/L (ref 98–111)
Creatinine, Ser: 0.97 mg/dL (ref 0.44–1.00)
GFR, Estimated: 57 mL/min — ABNORMAL LOW (ref >60)
Glucose, Bld: 153 mg/dL — ABNORMAL HIGH (ref 70–99)
Potassium: 4.3 mmol/L (ref 3.5–5.1)
Sodium: 139 mmol/L (ref 135–145)

## 2022-11-03 LAB — CBC
HCT: 25.4 % — ABNORMAL LOW (ref 36.0–46.0)
Hemoglobin: 8.6 g/dL — ABNORMAL LOW (ref 12.0–15.0)
MCH: 31.5 pg (ref 26.0–34.0)
MCHC: 33.9 g/dL (ref 30.0–36.0)
MCV: 93 fL (ref 80.0–100.0)
Platelets: 166 10*3/uL (ref 150–400)
RBC: 2.73 MIL/uL — ABNORMAL LOW (ref 3.87–5.11)
RDW: 13.7 % (ref 11.5–15.5)
WBC: 6.9 10*3/uL (ref 4.0–10.5)
nRBC: 0 % (ref 0.0–0.2)

## 2022-11-03 MED ORDER — POLYETHYLENE GLYCOL 3350 17 G PO PACK: 17.0000 g | PACK | Freq: Two times a day (BID) | ORAL | 0 refills | Status: DC

## 2022-11-03 MED ORDER — HYDROMORPHONE HCL 2 MG PO TABS: 1.0000 mg | ORAL_TABLET | Freq: Four times a day (QID) | ORAL | 0 refills | Status: DC | PRN

## 2022-11-03 MED ORDER — QUETIAPINE FUMARATE 25 MG PO TABS: 25.0000 mg | ORAL_TABLET | Freq: Every day | ORAL | Status: DC

## 2022-11-03 MED ORDER — ASPIRIN 81 MG PO TBEC: DELAYED_RELEASE_TABLET | ORAL | 3 refills | Status: DC

## 2022-11-03 MED ORDER — DOCUSATE SODIUM 100 MG PO CAPS: 100.0000 mg | ORAL_CAPSULE | Freq: Two times a day (BID) | ORAL | 0 refills | Status: DC

## 2022-11-03 MED ORDER — ENSURE ENLIVE PO LIQD: 237.0000 mL | Freq: Three times a day (TID) | ORAL | 12 refills | Status: DC

## 2022-11-03 NOTE — Progress Notes (Signed)
[facility] PROGRESS NOTE   [name] BSW:[id number] DOB: [birth year] DOA: 10/30/2022  PCP: Pleas Koch, NP  Brief History/Interval Summary: 89 y.o. female with medical history significant for Alzheimer's dementia, hypertension, hyperlipidemia, asthma, CAD status post CABG, chronic HFpEF, stroke, hypothyroidism, CKD stage IIIa, LBBB, history of aortic valve replacement, mitral valve prolapse/mitral regurgitation, paroxysmal atrial tachycardia/A-fib not on anticoagulation per cardiology due to extremely low burden of arrhythmia, has a pacemaker, AAA, PAD.  Recently admitted 12/28-12/29 for acute metabolic encephalopathy secondary to COVID-19 infection.  Patient was admitted due to left lower extremity weakness and concern for CVA but neurology felt this was less likely.  CT head negative for acute finding.  Brain MRI could not be done due to presence of a pacemaker.  Chest x-ray was showing an infiltrate and she was treated with Paxlovid.  She was not hypoxic. Patient presented to [facility] ED again for evaluation of left hip pain, recurrent left-sided weakness, and expressive aphasia.  Found to have a left hip fracture. ED physician spoke to Dr. Quinn Axe with neurology who recommended brain MRI for further evaluation could not be done at [place] patient has a pacemaker.  Case was discussed with radiology and ED physician was told that MRI could be done at [facility].  ED physician consulted Dr. Tamera Punt with orthopedics who agrees to see the patient in consultation at [facility].  Patient was transferred to [facility] for further evaluation.   Consultants: Cardiology.  Orthopedics.  Neurology  Procedures:  Left total hip replacement using anterior approach, 1/7 Echocardiogram    Subjective/Interval History: Patient had a better night last night.  No episodes of agitation reported.  Daughter is at the bedside.  No complaints offered by patient.       Assessment/Plan:  Recurrent strokelike symptoms Patient presented with left-sided weakness facial droop and expressive aphasia CT head was negative for acute findings.  Patient had recent hospitalization for similar complaints.  She was admitted to [facility].  She was seen by neurology at that time.  They felt that her presentation was most likely secondary to exacerbation of pre-existing deficits due to COVID infection.  Stroke thought to be less likely. Due to recurrent symptoms patient was thought to require MRI.  She has a pacemaker.  She was transferred to [facility].  Informed by radiology team that the pacemaker is unsafe for MRI. Patient seen by neurology.  EEG was ordered which is negative for epileptiform activity.  Neurology does not recommend any further investigations. Patient noted to be on aspirin and statin. LDL was 76 on December 29.  HbA1c 6.0 on December 29. Echocardiogram shows normal systolic function.  Hospital stay delirium in the setting of known dementia Patient was delirious and was started on Seroquel with improvement.  Continue to monitor.    Left hip fracture Seen by orthopedics.  Underwent surgery on 1/7.  PT and OT evaluation.  SNF is recommended.  Normocytic anemia/acute blood loss anemia postoperatively Drop in hemoglobin is likely due to hip fracture and surgery.  Hemoglobin is stable.   Transfuse for hemoglobin less than 8 considering her significant cardiac disease.  Unspecified arrhythmia Pacemaker interrogation does not reveal any evidence for atrial fibrillation.  She does have episodes of atrial tachycardia PACs and PVCs.  No indication for anticoagulation per cardiology.  Patient not allowing telemetry leads to stay on.  No concerning arrhythmias noted.    Pacemaker in situ Stable.  Pacemaker was interrogated by cardiology.  Coronary  artery disease status post CABG Stable from a cardiac standpoint.  Continue aspirin and  statin.  Hypokalemia Potassium has improved.  Recent COVID-19 infection Looks like she completed course of Paxlovid.  No respiratory symptoms currently.  She was tested on 12/28.   Patient has completed 10 days of isolation.  Came off isolation on 1/8.    Chronic's diastolic CHF Stable.  Euvolemic.  History of Alzheimer's dementia Stable.  Essential hypertension Cozaar was resumed.  Blood pressure stable for the most part. Hypothyroidism Levothyroxine  DVT Prophylaxis: SCDs.  Aspirin twice a day for 4 weeks as per orthopedics  Code Status: DNR Family Communication: Discussed with daughter at the bedside Disposition Plan: SNF when bed is available.  Status is: Inpatient Remains inpatient appropriate because: Left hip fracture    Medications: Scheduled:  aspirin  81 mg Oral BID   atorvastatin  40 mg Oral Daily   brimonidine  1 drop Left Eye BID   Chlorhexidine Gluconate Cloth  6 each Topical Daily   docusate sodium  100 mg Oral BID   feeding supplement  237 mL Oral TID BM   levothyroxine  50 mcg Oral Q0600   losartan  100 mg Oral Daily   polyethylene glycol  17 g Oral BID   QUEtiapine  25 mg Oral QHS   Continuous:  methocarbamol (ROBAXIN) IV     BBC:[id number], diphenhydrAMINE, HYDROmorphone (DILAUDID) injection, HYDROmorphone, menthol-cetylpyridinium **OR** phenol, methocarbamol **OR** methocarbamol (ROBAXIN) IV, metoCLOPramide **OR** metoCLOPramide (REGLAN) injection, ondansetron **OR** ondansetron (ZOFRAN) IV  Antibiotics: Anti-infectives (From admission, onward)    Start     Dose/Rate Route Frequency Ordered Stop   10/31/22 1230  ceFAZolin (ANCEF) IVPB 2g/100 mL premix        2 g 200 mL/hr over 30 Minutes Intravenous Every 6 hours 10/31/22 1137 10/31/22 1838   10/31/22 0600  ceFAZolin (ANCEF) IVPB 2g/100 mL premix        2 g 200 mL/hr over 30 Minutes Intravenous On call to O.R. 10/30/22 1832 10/31/22 0725       Objective:  Vital Signs  Vitals:    11/02/22 1953 11/02/22 1956 11/02/22 2332 11/03/22 0409  BP: (!) 125/58 131/71 (!) 130/57 (!) 135/52  Pulse: 87 99 67 60  Resp: 16 18 16 16   Temp: 98.9 F (37.2 C) 97.8 F (36.6 C) 98.5 F (36.9 C) 97.8 F (36.6 C)  TempSrc: Oral Oral Oral Oral  SpO2: 97% 95% 96% 96%  Weight:      Height:        Intake/Output Summary (Last 24 hours) at 11/03/2022 0921 Last data filed at 11/03/2022 0500 Gross per 24 hour  Intake --  Output 300 ml  Net -300 ml    Filed Weights   11/01/22 0941  Weight: 71 kg    General appearance: Awake alert.  In no distress Resp: Clear to auscultation bilaterally.  Normal effort Cardio: S1-S2 is normal regular.  No S3-S4.  No rubs murmurs or bruit GI: Abdomen is soft.  Nontender nondistended.  Bowel sounds are present normal.  No masses organomegaly   Lab Results:  Data Reviewed: I have personally reviewed following labs and reports of the imaging studies  CBC: Recent Labs  Lab 10/29/22 1436 10/30/22 1108 11/01/22 0418 11/02/22 0715 11/03/22 0627  WBC 13.2* 7.6 11.6* 8.3 6.9  NEUTROABS 11.8*  --   --   --   --   HGB 15.0 12.9 9.3* 8.6* 8.6*  HCT 43.4 36.6 26.5* 25.1* 25.4*  MCV  88.6 89.1 89.8 90.6 93.0  PLT 240 181 162 185 166     Basic Metabolic Panel: Recent Labs  Lab 10/29/22 1436 10/30/22 1108 11/01/22 0418 11/02/22 0715 11/03/22 0627  NA 135 134* 132* 137 139  K 2.9* 3.4* 3.6 3.6 4.3  CL 95* 99 101 104 106  CO2 25 23 20* 23 24  GLUCOSE 136* 148* 169* 154* 153*  BUN 15 12 17 18  24*  CREATININE 0.99 1.07* 1.17* 0.88 0.97  CALCIUM 9.1 8.0* 8.0* 8.5* 8.5*  MG  --  1.7 1.8 2.4  --      GFR: Estimated Creatinine Clearance: 43.5 mL/min (by C-G formula based on SCr of 0.97 mg/dL).  Liver Function Tests: Recent Labs  Lab 10/29/22 1436 10/30/22 1108 11/01/22 0418  AST 44* 33 27  ALT 46* 44 19  ALKPHOS 80 62 48  BILITOT 1.4* 1.1 1.0  PROT 7.6 5.9* 5.0*  ALBUMIN 4.3 3.2* 2.7*     Recent Labs  Lab 10/29/22 1436   LIPASE 30     Coagulation Profile: Recent Labs  Lab 10/29/22 1436  INR 1.0      Recent Results (from the past 240 hour(s))  Surgical pcr screen     Status: None   Collection Time: 10/31/22  4:54 AM   Specimen: Nasal Mucosa; Nasal Swab  Result Value Ref Range Status   MRSA, PCR NEGATIVE NEGATIVE Final   Staphylococcus aureus NEGATIVE NEGATIVE Final    Comment: (NOTE) The Xpert SA Assay (FDA approved for NASAL specimens in patients 73 years of age and older), is one component of a comprehensive surveillance program. It is not intended to diagnose infection nor to guide or monitor treatment. Performed at [facility], [place] [address] [place], [place] [address]        Radiology Studies: ECHOCARDIOGRAM COMPLETE  Result Date: 11/02/2022    ECHOCARDIOGRAM REPORT   Patient Name:   [name] Date of Exam: 11/02/2022 Medical Rec #:  [id number]        Height:       69.0 in Accession #:    [id number]       Weight:       156.5 lb Date of Birth:  December 06, 1933        BSA:          1.862 m Patient Age:    53 years         BP:           135/63 mmHg Patient Gender: F                HR:           75 bpm. Exam Location:  Inpatient Procedure: 2D Echo, Cardiac Doppler and Color Doppler Indications:    Aortic valve disorder  History:        Patient has prior history of Echocardiogram examinations, most                 recent 05/02/2019. Prior CABG and Pacemaker, Aortic Valve Disease                 and Mitral Valve Prolapse; Risk Factors:Hypertension and                 Dyslipidemia. [facility] Ease 21 mm bovine pericardial.                 Procedure Date: 03/29/2008.  Aortic Valve: 21 mm bioprosthetic valve is present in the aortic                 position. Procedure Date: 03/29/2008.  Sonographer:    Ross Ludwig RDCS (AE) Referring Phys: [id number] [address] Stade  Sonographer Comments: No subcostal window. IMPRESSIONS  1. Left ventricular ejection fraction, by estimation, is 60 to 65%.  The left ventricle has normal function. The left ventricle has no regional wall motion abnormalities. Indeterminate diastolic filling due to E-A fusion.  2. Right ventricular systolic function is normal. The right ventricular size is normal. Tricuspid regurgitation signal is inadequate for assessing PA pressure.  3. Left atrial size was mildly dilated.  4. The mitral valve is degenerative. Trivial mitral valve regurgitation. Mild mitral stenosis. The mean mitral valve gradient is 5.0 mmHg with average heart rate of 77 bpm.  5. 21 mm Magna Ease aortic valve prosthesis (implant 03/29/2008). Vmax 2.4 m/s, MG 12 mmHG, EOA 1.26 cm2, DI 0.45. Gradients within limits. The aortic valve has been repaired/replaced. Aortic valve regurgitation is not visualized. There is a 21 mm bioprosthetic valve present in the aortic position. Procedure Date: 03/29/2008. Comparison(s): No significant change from prior study. FINDINGS  Left Ventricle: Left ventricular ejection fraction, by estimation, is 60 to 65%. The left ventricle has normal function. The left ventricle has no regional wall motion abnormalities. The left ventricular internal cavity size was normal in size. There is  no left ventricular hypertrophy. Abnormal (paradoxical) septal motion consistent with post-operative status. Indeterminate diastolic filling due to E-A fusion. Right Ventricle: The right ventricular size is normal. No increase in right ventricular wall thickness. Right ventricular systolic function is normal. Tricuspid regurgitation signal is inadequate for assessing PA pressure. Left Atrium: Left atrial size was mildly dilated. Right Atrium: Right atrial size was normal in size. Pericardium: Trivial pericardial effusion is present. Presence of epicardial fat layer. Mitral Valve: The mitral valve is degenerative in appearance. There is mild calcification of the anterior and posterior mitral valve leaflet(s). Mild to moderate mitral annular calcification. Trivial  mitral valve regurgitation. Mild mitral valve stenosis. MV peak gradient, 8.5 mmHg. The mean mitral valve gradient is 5.0 mmHg with average heart rate of 77 bpm. Tricuspid Valve: The tricuspid valve is grossly normal. Tricuspid valve regurgitation is trivial. No evidence of tricuspid stenosis. Aortic Valve: 21 mm Magna Ease aortic valve prosthesis (implant 03/29/2008). Vmax 2.4 m/s, MG 12 mmHG, EOA 1.26 cm2, DI 0.45. Gradients within limits. The aortic valve has been repaired/replaced. Aortic valve regurgitation is not visualized. Aortic valve mean gradient measures 12.0 mmHg. Aortic valve peak gradient measures 23.6 mmHg. Aortic valve area, by VTI measures 1.26 cm. There is a 21 mm bioprosthetic valve present in the aortic position. Procedure Date: 03/29/2008. Pulmonic Valve: The pulmonic valve was grossly normal. Pulmonic valve regurgitation is trivial. No evidence of pulmonic stenosis. Aorta: The aortic root and ascending aorta are structurally normal, with no evidence of dilitation. Venous: The inferior vena cava was not well visualized. IAS/Shunts: The atrial septum is grossly normal. Additional Comments: A device lead is visualized in the right atrium and right ventricle.  LEFT VENTRICLE PLAX 2D LVIDd:         4.00 cm   Diastology LV PW:         1.20 cm   LV e' medial:    5.55 cm/s LV IVS:        1.00 cm   LV E/e' medial:  23.1 LVOT diam:  1.90 cm   LV e' lateral:   5.22 cm/s LV SV:         57        LV E/e' lateral: 24.5 LV SV Index:   31 LVOT Area:     2.84 cm  RIGHT VENTRICLE RV Basal diam:  3.40 cm RV S prime:     6.85 cm/s TAPSE (M-mode): 1.9 cm LEFT ATRIUM             Index        RIGHT ATRIUM           Index LA Vol (A2C):   69.1 ml 37.12 ml/m  RA Area:     16.00 cm LA Vol (A4C):   47.9 ml 25.73 ml/m  RA Volume:   36.20 ml  19.45 ml/m LA Biplane Vol: 58.4 ml 31.37 ml/m  AORTIC VALVE AV Area (Vmax):    1.24 cm AV Area (Vmean):   1.27 cm AV Area (VTI):     1.26 cm AV Vmax:           243.00 cm/s AV  Vmean:          160.000 cm/s AV VTI:            0.451 m AV Peak Grad:      23.6 mmHg AV Mean Grad:      12.0 mmHg LVOT Vmax:         106.00 cm/s LVOT Vmean:        71.700 cm/s LVOT VTI:          0.201 m LVOT/AV VTI ratio: 0.45  AORTA Ao Root diam: 2.60 cm Ao Asc diam:  2.90 cm MITRAL VALVE MV Area (PHT): 3.20 cm     SHUNTS MV Area VTI:   1.67 cm     Systemic VTI:  0.20 m MV Peak grad:  8.5 mmHg     Systemic Diam: 1.90 cm MV Mean grad:  5.0 mmHg MV Vmax:       1.46 m/s MV Vmean:      105.0 cm/s MV Decel Time: 237 msec MV E velocity: 128.00 cm/s MV A velocity: 146.00 cm/s MV E/A ratio:  0.88 Lennie Odor MD Electronically signed by Lennie Odor MD Signature Date/Time: 11/02/2022/4:18:09 PM    Final        LOS: 4 days   [name]  [facility] Pager on [email or web address]  11/03/2022, 9:21 AM

## 2022-11-03 NOTE — Progress Notes (Signed)
Recommend tylenol for pain management at discharge.  Aspirin 81 mg BID x4 weeks post-op. PWB 25-50% Follow up in our office in 2 weeks. Dressings are waterproof and should be left in place until follow up.   Costella Hatcher, PA-C Orthopedic Surgery [facility] [facility] [phone]

## 2022-11-03 NOTE — Progress Notes (Signed)
Cardologist:  Croitoru   Subjective:  Denies SSCP, palpitations or Dyspnea Dementia but pleasant Post left THR Waiting on d/c planning and rehab  Objective:  Vitals:   11/02/22 1956 11/02/22 2332 11/03/22 0409 11/03/22 0922  BP: 131/71 (!) 130/57 (!) 135/52 (!) 160/68  Pulse: 99 67 60 61  Resp: 18 16 16 18   Temp: 97.8 F (36.6 C) 98.5 F (36.9 C) 97.8 F (36.6 C) (!) 97.5 F (36.4 C)  TempSrc: Oral Oral Oral Oral  SpO2: 95% 96% 96% 97%  Weight:      Height:        Intake/Output from previous day:  Intake/Output Summary (Last 24 hours) at 11/03/2022 01/02/2023 Last data filed at 11/03/2022 0500 Gross per 24 hour  Intake --  Output 300 ml  Net -300 ml    Physical Exam: Elderly female Post left THR SEM through old AVR post sternotomy PPM under left clavicle  Abdomen benign No edema  Lungs clear   Lab Results: Basic Metabolic Panel: Recent Labs    11/01/22 0418 11/02/22 0715 11/03/22 0627  NA 132* 137 139  K 3.6 3.6 4.3  CL 101 104 106  CO2 20* 23 24  GLUCOSE 169* 154* 153*  BUN 17 18 24*  CREATININE 1.17* 0.88 0.97  CALCIUM 8.0* 8.5* 8.5*  MG 1.8 2.4  --    Liver Function Tests: Recent Labs    11/01/22 0418  AST 27  ALT 19  ALKPHOS 48  BILITOT 1.0  PROT 5.0*  ALBUMIN 2.7*   No results for input(s): "LIPASE", "AMYLASE" in the last 72 hours.  CBC: Recent Labs    11/02/22 0715 11/03/22 0627  WBC 8.3 6.9  HGB 8.6* 8.6*  HCT 25.1* 25.4*  MCV 90.6 93.0  PLT 185 166     Imaging: ECHOCARDIOGRAM COMPLETE  Result Date: 11/02/2022    ECHOCARDIOGRAM REPORT   Patient Name:   [name] Date of Exam: 11/02/2022 Medical Rec #:  [id number]        Height:       69.0 in Accession #:    [id number]       Weight:       156.5 lb Date of Birth:  [birth year]        BSA:          1.862 m Patient Age:    88 years         BP:           135/63 mmHg Patient Gender: F                HR:           75 bpm. Exam Location:  Inpatient Procedure: 2D Echo, Cardiac  Doppler and Color Doppler Indications:    Aortic valve disorder  History:        Patient has prior history of Echocardiogram examinations, most                 recent 05/02/2019. Prior CABG and Pacemaker, Aortic Valve Disease                 and Mitral Valve Prolapse; Risk Factors:Hypertension and                 Dyslipidemia. [facility] Ease 21 mm bovine pericardial.                 Procedure Date: 03/29/2008.  Aortic Valve: 21 mm bioprosthetic valve is present in the aortic                 position. Procedure Date: 03/29/2008.  Sonographer:    Ross Ludwig RDCS (AE) Referring Phys: [id number] Wendall Stade  Sonographer Comments: No subcostal window. IMPRESSIONS  1. Left ventricular ejection fraction, by estimation, is 60 to 65%. The left ventricle has normal function. The left ventricle has no regional wall motion abnormalities. Indeterminate diastolic filling due to E-A fusion.  2. Right ventricular systolic function is normal. The right ventricular size is normal. Tricuspid regurgitation signal is inadequate for assessing PA pressure.  3. Left atrial size was mildly dilated.  4. The mitral valve is degenerative. Trivial mitral valve regurgitation. Mild mitral stenosis. The mean mitral valve gradient is 5.0 mmHg with average heart rate of 77 bpm.  5. 21 mm Magna Ease aortic valve prosthesis (implant 03/29/2008). Vmax 2.4 m/s, MG 12 mmHG, EOA 1.26 cm2, DI 0.45. Gradients within limits. The aortic valve has been repaired/replaced. Aortic valve regurgitation is not visualized. There is a 21 mm bioprosthetic valve present in the aortic position. Procedure Date: 03/29/2008. Comparison(s): No significant change from prior study. FINDINGS  Left Ventricle: Left ventricular ejection fraction, by estimation, is 60 to 65%. The left ventricle has normal function. The left ventricle has no regional wall motion abnormalities. The left ventricular internal cavity size was normal in size. There is  no left ventricular  hypertrophy. Abnormal (paradoxical) septal motion consistent with post-operative status. Indeterminate diastolic filling due to E-A fusion. Right Ventricle: The right ventricular size is normal. No increase in right ventricular wall thickness. Right ventricular systolic function is normal. Tricuspid regurgitation signal is inadequate for assessing PA pressure. Left Atrium: Left atrial size was mildly dilated. Right Atrium: Right atrial size was normal in size. Pericardium: Trivial pericardial effusion is present. Presence of epicardial fat layer. Mitral Valve: The mitral valve is degenerative in appearance. There is mild calcification of the anterior and posterior mitral valve leaflet(s). Mild to moderate mitral annular calcification. Trivial mitral valve regurgitation. Mild mitral valve stenosis. MV peak gradient, 8.5 mmHg. The mean mitral valve gradient is 5.0 mmHg with average heart rate of 77 bpm. Tricuspid Valve: The tricuspid valve is grossly normal. Tricuspid valve regurgitation is trivial. No evidence of tricuspid stenosis. Aortic Valve: 21 mm Magna Ease aortic valve prosthesis (implant 03/29/2008). Vmax 2.4 m/s, MG 12 mmHG, EOA 1.26 cm2, DI 0.45. Gradients within limits. The aortic valve has been repaired/replaced. Aortic valve regurgitation is not visualized. Aortic valve mean gradient measures 12.0 mmHg. Aortic valve peak gradient measures 23.6 mmHg. Aortic valve area, by VTI measures 1.26 cm. There is a 21 mm bioprosthetic valve present in the aortic position. Procedure Date: 03/29/2008. Pulmonic Valve: The pulmonic valve was grossly normal. Pulmonic valve regurgitation is trivial. No evidence of pulmonic stenosis. Aorta: The aortic root and ascending aorta are structurally normal, with no evidence of dilitation. Venous: The inferior vena cava was not well visualized. IAS/Shunts: The atrial septum is grossly normal. Additional Comments: A device lead is visualized in the right atrium and right ventricle.   LEFT VENTRICLE PLAX 2D LVIDd:         4.00 cm   Diastology LV PW:         1.20 cm   LV e' medial:    5.55 cm/s LV IVS:        1.00 cm   LV E/e' medial:  23.1 LVOT diam:  1.90 cm   LV e' lateral:   5.22 cm/s LV SV:         57        LV E/e' lateral: 24.5 LV SV Index:   31 LVOT Area:     2.84 cm  RIGHT VENTRICLE RV Basal diam:  3.40 cm RV S prime:     6.85 cm/s TAPSE (M-mode): 1.9 cm LEFT ATRIUM             Index        RIGHT ATRIUM           Index LA Vol (A2C):   69.1 ml 37.12 ml/m  RA Area:     16.00 cm LA Vol (A4C):   47.9 ml 25.73 ml/m  RA Volume:   36.20 ml  19.45 ml/m LA Biplane Vol: 58.4 ml 31.37 ml/m  AORTIC VALVE AV Area (Vmax):    1.24 cm AV Area (Vmean):   1.27 cm AV Area (VTI):     1.26 cm AV Vmax:           243.00 cm/s AV Vmean:          160.000 cm/s AV VTI:            0.451 m AV Peak Grad:      23.6 mmHg AV Mean Grad:      12.0 mmHg LVOT Vmax:         106.00 cm/s LVOT Vmean:        71.700 cm/s LVOT VTI:          0.201 m LVOT/AV VTI ratio: 0.45  AORTA Ao Root diam: 2.60 cm Ao Asc diam:  2.90 cm MITRAL VALVE MV Area (PHT): 3.20 cm     SHUNTS MV Area VTI:   1.67 cm     Systemic VTI:  0.20 m MV Peak grad:  8.5 mmHg     Systemic Diam: 1.90 cm MV Mean grad:  5.0 mmHg MV Vmax:       1.46 m/s MV Vmean:      105.0 cm/s MV Decel Time: 237 msec MV E velocity: 128.00 cm/s MV A velocity: 146.00 cm/s MV E/A ratio:  0.88 Eleonore Chiquito MD Electronically signed by Eleonore Chiquito MD Signature Date/Time: 11/02/2022/4:18:09 PM    Final     Cardiac Studies:  ECG: SR PAC RBBB/LAFB   Telemetry:  A pacing   Echo: pending  Medications:    aspirin  81 mg Oral BID   atorvastatin  40 mg Oral Daily   brimonidine  1 drop Left Eye BID   Chlorhexidine Gluconate Cloth  6 each Topical Daily   docusate sodium  100 mg Oral BID   feeding supplement  237 mL Oral TID BM   levothyroxine  50 mcg Oral Q0600   losartan  100 mg Oral Daily   polyethylene glycol  17 g Oral BID   QUEtiapine  25 mg Oral QHS       methocarbamol (ROBAXIN) IV      Assessment/Plan:   Ortho:  post left THR pain control good PT/OT CABG:  no angina ontinue ASA and statin  AVR: Bioprosthetic AVR 2009 TTE 11/02/22 normal valve function mean gradient 12 mmHg No AR with normal EF Valve performing surprisingly will for age  Thyroid: continue synthroid replacement  PPM:  normal function most recent PACEART 08/30/22   Cardiology will sign off patient waiting placement complicated by dementia and need for PT/OT post left THR  [facility]  11/03/2022, 9:28 AM

## 2022-11-03 NOTE — Progress Notes (Signed)
Mobility Specialist Progress Note   11/03/22 0940  Mobility  Activity Transferred to/from [facility];Ambulated with assistance in hallway  Level of Assistance Minimal assist, patient does 75% or more  Assistive Device Front wheel walker  Distance Ambulated (ft) 140 ft  Range of Motion/Exercises Active;All extremities  LLE Weight Bearing WBAT  Activity Response Tolerated well   Patient received in supine, sleep and easily aroused. Agreeable to participate. Daughter present and assisted as needed. Patient presented A&Ox2, was able to tell me she was in the hospital but unsure of time, date and location. Urgently requested assistance to [facility]. Was independent for bed mobility and daughter assisted patient to [facility] via dependent squat-pivot. Performed pericare with TA and was able to stand with min A. Patient then ambulated in hallway with slow steady gait and min A to close min guard for safety. Needed frequent cueing to maintain proximal distance to RW and to navigate in hallway. Also needed x1 standing rest break secondary to fatigue. Returned to room without complaint or incident. Was left in supine with all needs met, call bell in reach.   [place] Daziya Redmond, BS EXP Mobility Specialist Please contact via SecureChat or Rehab office at [phone]

## 2022-11-04 NOTE — Progress Notes (Signed)
Physical Therapy Treatment Patient Details Name: LISBET BUSKER MRN: [id number] DOB: 1934/09/01 Today's Date: 11/04/2022   History of Present Illness 89 y/o female presented to ED on 10/30/22 for evaluation of left hip pain, recurrent left-sided weakness, and expressive aphasia. She had 2 falls in 24 hours. CT head negative; unable to have MRI due to pacemaker; +left femoral neck fx, s/p Left total hip replacement through an anterior approach 10/31/22;  PMH: AAA, CAD s/p CABG, HTN, asthma, dementia, CKD, pacemaker, CVA, CHF, AVR, afib not on anticoagulation,    PT Comments    Patient seemed more easily fatigued today. Walked 90 ft with RW with min assist for maneuvering around obstacles (pt bumps into them and then needs help to negotiate around them). Pt didn't think she could stand any longer for standing exercises and flopped down onto bed into sitting. Participated in supine exercises.     Recommendations for follow up therapy are one component of a multi-disciplinary discharge planning process, led by the attending physician.  Recommendations may be updated based on patient status, additional functional criteria and insurance authorization.  Follow Up Recommendations  Skilled nursing-short term rehab (<3 hours/day) Can patient physically be transported by private vehicle: Yes   Assistance Recommended at Discharge Frequent or constant Supervision/Assistance  Patient can return home with the following A little help with walking and/or transfers;A little help with bathing/dressing/bathroom;Direct supervision/assist for medications management;Direct supervision/assist for financial management   Equipment Recommendations  None recommended by PT    Recommendations for Other Services       Precautions / Restrictions Precautions Precautions: Fall Restrictions Weight Bearing Restrictions: Yes LLE Weight Bearing: Weight bearing as tolerated     Mobility  Bed Mobility Overal bed  mobility: Needs Assistance Bed Mobility: Supine to Sit, Sit to Supine     Supine to sit: Min guard Sit to supine: Min guard   General bed mobility comments: incr time and effort, but no physical assist    Transfers Overall transfer level: Needs assistance Equipment used: Rolling walker (2 wheels) Transfers: Sit to/from Stand Sit to Stand: Min assist           General transfer comment: cues for hand placement; boosting assist    Ambulation/Gait Ambulation/Gait assistance: Min assist Gait Distance (Feet): 90 Feet Assistive device: Rolling walker (2 wheels) Gait Pattern/deviations: Step-through pattern, Decreased step length - left   Gait velocity interpretation: 1.31 - 2.62 ft/sec, indicative of limited community ambulator   General Gait Details: running into objects with assist to maneuver RW around them; vc for proximity to RW; (pt accustomed to rollator)   Marine scientist [place] (Stroke Patients Only)       Balance Overall balance assessment: Needs assistance Sitting-balance support: No upper extremity supported, Feet supported Sitting balance-Leahy Scale: Good Sitting balance - Comments: reaches down to pull up socks without imbalance   Standing balance support: Bilateral upper extremity supported, Reliant on assistive device for balance Standing balance-Leahy Scale: Poor Standing balance comment: also history of 2 recent falls; unwitnessed and family does not know what happened                            Cognition Arousal/Alertness: Awake/alert Behavior During Therapy: Flat affect Overall Cognitive Status: No family/caregiver present to determine baseline cognitive functioning Area of Impairment: Attention, Memory, Following commands, Safety/judgement, Awareness, Problem solving  Orientation Level:  (daughter reports earlier this a.m. she was oriented to place and  situation) Current Attention Level: Sustained Memory: Decreased short-term memory Following Commands: Follows one step commands with increased time Safety/Judgement: Decreased awareness of safety, Decreased awareness of deficits Awareness:  (pre-intellectual) Problem Solving: Difficulty sequencing, Requires verbal cues, Requires tactile cues General Comments: cognitive deficits at baseline, and daughter reports worsening over the last few months, acutely a significant difference;        Exercises General Exercises - Lower Extremity Heel Slides: Left, AROM, 10 reps (no indication of pain) Straight Leg Raises: AROM, Both, 10 reps    General Comments        Pertinent Vitals/Pain Pain Assessment Pain Assessment: Faces Faces Pain Scale: No hurt    Home Living                          Prior Function            PT Goals (current goals can now be found in the care plan section) Acute Rehab PT Goals Patient Stated Goal: unable to state; daughter wants her to work on walking and balance Time For Goal Achievement: 11/15/22 Potential to Achieve Goals: Good Progress towards PT goals: Progressing toward goals    Frequency    Min 3X/week      PT Plan Current plan remains appropriate    Co-evaluation              AM-PAC PT "6 Clicks" Mobility   Outcome Measure  Help needed turning from your back to your side while in a flat bed without using bedrails?: A Little Help needed moving from lying on your back to sitting on the side of a flat bed without using bedrails?: A Little Help needed moving to and from a bed to a chair (including a wheelchair)?: A Little Help needed standing up from a chair using your arms (e.g., wheelchair or bedside chair)?: A Little Help needed to walk in hospital room?: A Little Help needed climbing 3-5 steps with a railing? : A Lot 6 Click Score: 17    End of Session Equipment Utilized During Treatment: Gait belt Activity  Tolerance: Patient limited by fatigue Patient left: in bed;with call bell/phone within reach;with bed alarm set Nurse Communication: Mobility status PT Visit Diagnosis: Repeated falls (R29.6);Muscle weakness (generalized) (M62.81)     Time: 2774-1287 PT Time Calculation (min) (ACUTE ONLY): 17 min  Charges:  $Gait Training: 8-22 mins                      [place]  Office [phone]    Rexanne Mano 11/04/2022, 4:04 PM

## 2022-11-04 NOTE — Discharge Summary (Signed)
[facility]  Physician Discharge Summary   Patient ID: [name] MRN: [id number] DOB/AGE: Mar 04, 1934 89 y.o.  Admit date: 10/30/2022 Discharge date:   11/04/2022   PCP: Doreene Nest, NP  DISCHARGE DIAGNOSES:  Left hip fracture s/p hip arthroplasty   HTN (hypertension)   Hypothyroidism   HLD (hyperlipidemia)   Dementia (HCC)   Hypokalemia   Arrhythmia   RECOMMENDATIONS FOR OUTPATIENT FOLLOW UP: Patient to follow-up with Dr. Charlann Boxer with orthopedic surgery as instructed Check CBC and basic metabolic panel in 1 week    Home Health: SNF Equipment/Devices: None  CODE STATUS: DNR  DISCHARGE CONDITION: fair  Diet recommendation: Regular as tolerated  INITIAL HISTORY:  89 y.o. female with medical history significant for Alzheimer's dementia, hypertension, hyperlipidemia, asthma, CAD status post CABG, chronic HFpEF, stroke, hypothyroidism, CKD stage IIIa, LBBB, history of aortic valve replacement, mitral valve prolapse/mitral regurgitation, paroxysmal atrial tachycardia/A-fib not on anticoagulation per cardiology due to extremely low burden of arrhythmia, has a pacemaker, AAA, PAD.  Recently admitted 12/28-12/29 for acute metabolic encephalopathy secondary to COVID-19 infection.  Patient was admitted due to left lower extremity weakness and concern for CVA but neurology felt this was less likely.  CT head negative for acute finding.  Brain MRI could not be done due to presence of a pacemaker.  Chest x-ray was showing an infiltrate and she was treated with Paxlovid.  She was not hypoxic. Patient presented to [facility] ED again for evaluation of left hip pain, recurrent left-sided weakness, and expressive aphasia.  Found to have a left hip fracture. ED physician spoke to Dr. Selina Cooley with neurology who recommended brain MRI for further evaluation could not be done at [place] patient has a pacemaker.  Case was discussed with radiology and ED physician was told that MRI could be  done at [facility].  ED physician consulted Dr. Ave Filter with orthopedics who agrees to see the patient in consultation at [facility].  Patient was transferred to [facility] for further evaluation.    Consultants: Cardiology.  Orthopedics.  Neurology   Procedures:  Left total hip replacement using anterior approach, 1/7 Echocardiogram   HOSPITAL COURSE:   Left hip fracture Seen by orthopedics.  Underwent surgery on 1/7.  PT and OT evaluation.  SNF is recommended.  Recurrent strokelike symptoms Patient presented with left-sided weakness facial droop and expressive aphasia CT head was negative for acute findings.  Patient had recent hospitalization for similar complaints.  She was admitted to [facility].  She was seen by neurology at that time.  They felt that her presentation was most likely secondary to exacerbation of pre-existing deficits due to COVID infection.  Stroke thought to be less likely. Due to recurrent symptoms patient was thought to require MRI.  She has a pacemaker.  She was transferred to [facility].  Informed by radiology team that the pacemaker is unsafe for MRI. Patient seen by neurology.  EEG was ordered which is negative for epileptiform activity.  Neurology does not recommend any further investigations. Patient noted to be on aspirin and statin. LDL was 76 on December 29.  HbA1c 6.0 on December 29. Echocardiogram shows normal systolic function.   Hospital stay delirium in the setting of known dementia Patient was delirious and was started on Seroquel with improvement.       Normocytic anemia/acute blood loss anemia postoperatively Drop in hemoglobin is likely due to hip fracture and surgery.  Hemoglobin has been stable.  She did not require any blood  transfusion during this hospital stay.   Unspecified arrhythmia Pacemaker interrogation does not reveal any evidence for atrial fibrillation.  She does have episodes of atrial tachycardia PACs and PVCs.  No  indication for anticoagulation per cardiology.  Patient not allowing telemetry leads to stay on.  No concerning arrhythmias noted.  No further workup recommended by cardiology.  Defer outpatient follow-up to cardiology.  Pacemaker in situ Stable.  Pacemaker was interrogated by cardiology.   Coronary artery disease status post CABG Stable from a cardiac standpoint.  Continue aspirin and statin.   Hypokalemia Potassium has improved.  Recheck lab in a few days.   Recent COVID-19 infection Looks like she completed course of Paxlovid.  No respiratory symptoms currently.  She was tested on 12/28.   Patient has completed 10 days of isolation.  Came off isolation on 1/8.     Chronic's diastolic CHF Stable.  Euvolemic.   History of Alzheimer's dementia Stable.   Essential hypertension Cozaar was resumed.  Blood pressure stable for the most part.  Hypothyroidism Levothyroxine  Patient is stable.  Okay for discharge to SNF when bed is available.   PERTINENT LABS:  The results of significant diagnostics from this hospitalization (including imaging, microbiology, ancillary and laboratory) are listed below for reference.    Microbiology: Recent Results (from the past 240 hour(s))  Surgical pcr screen     Status: None   Collection Time: 10/31/22  4:54 AM   Specimen: Nasal Mucosa; Nasal Swab  Result Value Ref Range Status   MRSA, PCR NEGATIVE NEGATIVE Final   Staphylococcus aureus NEGATIVE NEGATIVE Final    Comment: (NOTE) The Xpert SA Assay (FDA approved for NASAL specimens in patients 41 years of age and older), is one component of a comprehensive surveillance program. It is not intended to diagnose infection nor to guide or monitor treatment. Performed at [facility], [place] [address] [place], [place] [address]      Labs:   Basic Metabolic Panel: Recent Labs  Lab 10/29/22 1436 10/30/22 1108 11/01/22 0418 11/02/22 0715 11/03/22 0627  NA 135 134* 132* 137 139   K 2.9* 3.4* 3.6 3.6 4.3  CL 95* 99 101 104 106  CO2 25 23 20* 23 24  GLUCOSE 136* 148* 169* 154* 153*  BUN 15 12 17 18  24*  CREATININE 0.99 1.07* 1.17* 0.88 0.97  CALCIUM 9.1 8.0* 8.0* 8.5* 8.5*  MG  --  1.7 1.8 2.4  --    Liver Function Tests: Recent Labs  Lab 10/29/22 1436 10/30/22 1108 11/01/22 0418  AST 44* 33 27  ALT 46* 44 19  ALKPHOS 80 62 48  BILITOT 1.4* 1.1 1.0  PROT 7.6 5.9* 5.0*  ALBUMIN 4.3 3.2* 2.7*   Recent Labs  Lab 10/29/22 1436  LIPASE 30    CBC: Recent Labs  Lab 10/29/22 1436 10/30/22 1108 11/01/22 0418 11/02/22 0715 11/03/22 0627  WBC 13.2* 7.6 11.6* 8.3 6.9  NEUTROABS 11.8*  --   --   --   --   HGB 15.0 12.9 9.3* 8.6* 8.6*  HCT 43.4 36.6 26.5* 25.1* 25.4*  MCV 88.6 89.1 89.8 90.6 93.0  PLT 240 181 162 185 166    IMAGING STUDIES ECHOCARDIOGRAM COMPLETE  Result Date: 11/02/2022    ECHOCARDIOGRAM REPORT   Patient Name:   BREYONA SWANDER Date of Exam: 11/02/2022 Medical Rec #:  [id number]        Height:       69.0 in Accession #:  [id number]       Weight:       156.5 lb Date of Birth:  1934/02/27        BSA:          1.862 m Patient Age:    57 years         BP:           135/63 mmHg Patient Gender: F                HR:           75 bpm. Exam Location:  Inpatient Procedure: 2D Echo, Cardiac Doppler and Color Doppler Indications:    Aortic valve disorder  History:        Patient has prior history of Echocardiogram examinations, most                 recent 05/02/2019. Prior CABG and Pacemaker, Aortic Valve Disease                 and Mitral Valve Prolapse; Risk Factors:Hypertension and                 Dyslipidemia. [facility] Ease 21 mm bovine pericardial.                 Procedure Date: 03/29/2008.                  Aortic Valve: 21 mm bioprosthetic valve is present in the aortic                 position. Procedure Date: 03/29/2008.  Sonographer:    Clayton Lefort RDCS (AE) Referring Phys: [place] Comments: No subcostal window.  IMPRESSIONS  1. Left ventricular ejection fraction, by estimation, is 60 to 65%. The left ventricle has normal function. The left ventricle has no regional wall motion abnormalities. Indeterminate diastolic filling due to E-A fusion.  2. Right ventricular systolic function is normal. The right ventricular size is normal. Tricuspid regurgitation signal is inadequate for assessing PA pressure.  3. Left atrial size was mildly dilated.  4. The mitral valve is degenerative. Trivial mitral valve regurgitation. Mild mitral stenosis. The mean mitral valve gradient is 5.0 mmHg with average heart rate of 77 bpm.  5. 21 mm Magna Ease aortic valve prosthesis (implant 03/29/2008). Vmax 2.4 m/s, MG 12 mmHG, EOA 1.26 cm2, DI 0.45. Gradients within limits. The aortic valve has been repaired/replaced. Aortic valve regurgitation is not visualized. There is a 21 mm bioprosthetic valve present in the aortic position. Procedure Date: 03/29/2008. Comparison(s): No significant change from prior study. FINDINGS  Left Ventricle: Left ventricular ejection fraction, by estimation, is 60 to 65%. The left ventricle has normal function. The left ventricle has no regional wall motion abnormalities. The left ventricular internal cavity size was normal in size. There is  no left ventricular hypertrophy. Abnormal (paradoxical) septal motion consistent with post-operative status. Indeterminate diastolic filling due to E-A fusion. Right Ventricle: The right ventricular size is normal. No increase in right ventricular wall thickness. Right ventricular systolic function is normal. Tricuspid regurgitation signal is inadequate for assessing PA pressure. Left Atrium: Left atrial size was mildly dilated. Right Atrium: Right atrial size was normal in size. Pericardium: Trivial pericardial effusion is present. Presence of epicardial fat layer. Mitral Valve: The mitral valve is degenerative in appearance. There is mild calcification of the anterior and posterior  mitral valve leaflet(s). Mild to moderate mitral annular calcification. Trivial mitral valve  regurgitation. Mild mitral valve stenosis. MV peak gradient, 8.5 mmHg. The mean mitral valve gradient is 5.0 mmHg with average heart rate of 77 bpm. Tricuspid Valve: The tricuspid valve is grossly normal. Tricuspid valve regurgitation is trivial. No evidence of tricuspid stenosis. Aortic Valve: 21 mm Magna Ease aortic valve prosthesis (implant 03/29/2008). Vmax 2.4 m/s, MG 12 mmHG, EOA 1.26 cm2, DI 0.45. Gradients within limits. The aortic valve has been repaired/replaced. Aortic valve regurgitation is not visualized. Aortic valve mean gradient measures 12.0 mmHg. Aortic valve peak gradient measures 23.6 mmHg. Aortic valve area, by VTI measures 1.26 cm. There is a 21 mm bioprosthetic valve present in the aortic position. Procedure Date: 03/29/2008. Pulmonic Valve: The pulmonic valve was grossly normal. Pulmonic valve regurgitation is trivial. No evidence of pulmonic stenosis. Aorta: The aortic root and ascending aorta are structurally normal, with no evidence of dilitation. Venous: The inferior vena cava was not well visualized. IAS/Shunts: The atrial septum is grossly normal. Additional Comments: A device lead is visualized in the right atrium and right ventricle.  LEFT VENTRICLE PLAX 2D LVIDd:         4.00 cm   Diastology LV PW:         1.20 cm   LV e' medial:    5.55 cm/s LV IVS:        1.00 cm   LV E/e' medial:  23.1 LVOT diam:     1.90 cm   LV e' lateral:   5.22 cm/s LV SV:         57        LV E/e' lateral: 24.5 LV SV Index:   31 LVOT Area:     2.84 cm  RIGHT VENTRICLE RV Basal diam:  3.40 cm RV S prime:     6.85 cm/s TAPSE (M-mode): 1.9 cm LEFT ATRIUM             Index        RIGHT ATRIUM           Index LA Vol (A2C):   69.1 ml 37.12 ml/m  RA Area:     16.00 cm LA Vol (A4C):   47.9 ml 25.73 ml/m  RA Volume:   36.20 ml  19.45 ml/m LA Biplane Vol: 58.4 ml 31.37 ml/m  AORTIC VALVE AV Area (Vmax):    1.24 cm AV Area  (Vmean):   1.27 cm AV Area (VTI):     1.26 cm AV Vmax:           243.00 cm/s AV Vmean:          160.000 cm/s AV VTI:            0.451 m AV Peak Grad:      23.6 mmHg AV Mean Grad:      12.0 mmHg LVOT Vmax:         106.00 cm/s LVOT Vmean:        71.700 cm/s LVOT VTI:          0.201 m LVOT/AV VTI ratio: 0.45  AORTA Ao Root diam: 2.60 cm Ao Asc diam:  2.90 cm MITRAL VALVE MV Area (PHT): 3.20 cm     SHUNTS MV Area VTI:   1.67 cm     Systemic VTI:  0.20 m MV Peak grad:  8.5 mmHg     Systemic Diam: 1.90 cm MV Mean grad:  5.0 mmHg MV Vmax:       1.46 m/s MV Vmean:  105.0 cm/s MV Decel Time: 237 msec MV E velocity: 128.00 cm/s MV A velocity: 146.00 cm/s MV E/A ratio:  0.88 Lennie Odor MD Electronically signed by Lennie Odor MD Signature Date/Time: 11/02/2022/4:18:09 PM    Final    DG HIP UNILAT WITH PELVIS 2-3 VIEWS RIGHT  Result Date: 10/31/2022 CLINICAL DATA:  Left hip fracture repair Fluoroscopy time: 15.2 seconds. Cumulative air kerma: 1.74 mGy Images: 3 EXAM: DG HIP (WITH OR WITHOUT PELVIS) 2-3V RIGHT COMPARISON:  None Available. FINDINGS: Three images were obtained during left hip replacement. IMPRESSION: Left hip fracture repair. Electronically Signed   By: Gerome Sam III M.D.   On: 10/31/2022 13:00   EEG adult  Result Date: 10/30/2022 Jefferson Fuel, MD     10/30/2022  6:44 PM Routine EEG Report LANETTA FIGUERO is a 89 y.o. female with a history of altered mental status who is undergoing an EEG to evaluate for seizures. Report: This EEG was acquired with electrodes placed according to the International 10-20 electrode system (including Fp1, Fp2, F3, F4, C3, C4, P3, P4, O1, O2, T3, T4, T5, T6, A1, A2, Fz, Cz, Pz). The following electrodes were missing or displaced: none. The occipital dominant rhythm was 5-7 Hz. This activity is reactive to stimulation. Drowsiness was manifested by background fragmentation; deeper stages of sleep were identified by K complexes and sleep spindles. There was no  focal slowing. There were no interictal epileptiform discharges. There were no electrographic seizures identified. Photic stimulation and hyperventilation were not performed. Impression and clinical correlation: This EEG was obtained while awake and asleep and is abnormal due to mild diffuse slowing indicative of global cerebral dysfunction. Epileptiform abnormalities were not seen during this recording. Bing Neighbors, MD [facility] [phone] If 7pm- 7am, please page neurology on call as listed in AMION.   DG Hip Unilat W or Wo Pelvis 2-3 Views Left  Result Date: 10/29/2022 CLINICAL DATA:  Left hip pain after fall EXAM: DG HIP (WITH OR WITHOUT PELVIS) 2-3V LEFT COMPARISON:  Left hip x-ray 10/13/2022 FINDINGS: There is an acute left femoral neck fracture with mild superior displacement of the distal fracture fragment. There is no dislocation. The bones are osteopenic. Right hip arthroplasty appears in anatomic alignment. IMPRESSION: Acute left femoral neck fracture. Electronically Signed   By: Darliss Cheney M.D.   On: 10/29/2022 15:38   DG Chest Portable 1 View  Result Date: 10/29/2022 CLINICAL DATA:  Left hip pain after fall EXAM: PORTABLE CHEST 1 VIEW COMPARISON:  10/21/2022 FINDINGS: Single frontal view of the chest demonstrates stable postsurgical changes from median sternotomy and aortic valve replacement. Dual lead pacemaker unchanged. Cardiac silhouette is stable. No airspace disease, effusion, or pneumothorax. No acute displaced fracture. Stable hiatal hernia. IMPRESSION: 1. No acute intrathoracic process. Electronically Signed   By: Sharlet Salina M.D.   On: 10/29/2022 15:29   CT HEAD WO CONTRAST ( )  Result Date: 10/29/2022 CLINICAL DATA:  Trauma. EXAM: CT HEAD WITHOUT CONTRAST TECHNIQUE: Contiguous axial images were obtained from the base of the skull through the vertex without intravenous contrast. RADIATION DOSE REDUCTION: This exam was performed according to the departmental  dose-optimization program which includes automated exposure control, adjustment of the mA and/or kV according to patient size and/or use of iterative reconstruction technique. COMPARISON:  Head CT 10/20/2022.  MRI brain 04/10/2018. FINDINGS: Brain: No evidence of acute infarction, hemorrhage, hydrocephalus, extra-axial collection or mass lesion/mass effect. There is mild diffuse atrophy. There is stable moderate periventricular white matter hypodensity, likely  chronic small vessel ischemic change. Vascular: Atherosclerotic calcifications are present within the cavernous internal carotid arteries. Skull: Normal. Negative for fracture or focal lesion. Sinuses/Orbits: No acute finding. Other: None. IMPRESSION: 1. No acute intracranial process. 2. Stable atrophy and chronic small vessel ischemic changes. Electronically Signed   By: Darliss Cheney M.D.   On: 10/29/2022 15:15   CT Cervical Spine Wo Contrast  Result Date: 10/29/2022 CLINICAL DATA:  Neck trauma (Age >= 65y) EXAM: CT CERVICAL SPINE WITHOUT CONTRAST TECHNIQUE: Multidetector CT imaging of the cervical spine was performed without intravenous contrast. Multiplanar CT image reconstructions were also generated. RADIATION DOSE REDUCTION: This exam was performed according to the departmental dose-optimization program which includes automated exposure control, adjustment of the mA and/or kV according to patient size and/or use of iterative reconstruction technique. COMPARISON:  CT cervical spine 11/14/2021. FINDINGS: Alignment: Mild anterolisthesis of C2 on C3 is unchanged. No new sagittal subluxation. Skull base and vertebrae: No evidence of acute fracture. Soft tissues and spinal canal: No prevertebral fluid or swelling. No visible canal hematoma. Disc levels: Similar multilevel degenerative change. Upper chest: Biapical pleuroparenchymal scarring. IMPRESSION: No evidence of acute fracture or traumatic malalignment. Electronically Signed   By: Feliberto Harts  M.D.   On: 10/29/2022 15:15   DG HIP UNILAT WITH PELVIS 2-3 VIEWS LEFT  Result Date: 10/21/2022 CLINICAL DATA:  Left hip pain. EXAM: DG HIP (WITH OR WITHOUT PELVIS) 2-3V LEFT COMPARISON:  03/02/2022 FINDINGS: Total right hip arthroplasty components appear well seated without complicating features. Pubic symphysis and SI joints are intact. No pelvic fractures or bone lesions are identified. Left hip is normally located. No significant degenerative changes. No left hip fracture or AVN. IMPRESSION: Intact left hip. No fracture or AVN. No significant degenerative changes. Electronically Signed   By: Rudie Meyer M.D.   On: 10/21/2022 10:49   DG Chest Portable 1 View  Result Date: 10/21/2022 CLINICAL DATA:  89 year old female with history of altered mental status. COVID infection. EXAM: PORTABLE CHEST 1 VIEW COMPARISON:  Chest x-ray 11/14/2021. FINDINGS: Patchy areas of airspace consolidation are noted in the left base and medial aspect of the left upper lobe. No definite pleural effusions. Right lung is clear. No pneumothorax. No evidence of pulmonary edema. Heart size is borderline enlarged. Upper mediastinal contours are within normal limits. Left-sided pacemaker device in place with lead tips projecting over the expected location of the right atrium and right ventricle. Status post median sternotomy for CABG and aortic valve replacement with what appears to be a stented bioprosthesis. IMPRESSION: 1. Patchy airspace consolidation in the left lung, concerning for multilobar bronchopneumonia. Followup PA and lateral chest X-ray is recommended in 3-4 weeks following trial of antibiotic therapy to ensure resolution and exclude underlying malignancy. Electronically Signed   By: Trudie Reed M.D.   On: 10/21/2022 07:15   CT ABDOMEN PELVIS W CONTRAST  Result Date: 10/21/2022 CLINICAL DATA:  89 year old female with history of altered mental status, weakness and abdominal pain. EXAM: CT ABDOMEN AND PELVIS  WITH CONTRAST TECHNIQUE: Multidetector CT imaging of the abdomen and pelvis was performed using the standard protocol following bolus administration of intravenous contrast. RADIATION DOSE REDUCTION: This exam was performed according to the departmental dose-optimization program which includes automated exposure control, adjustment of the mA and/or kV according to patient size and/or use of iterative reconstruction technique. CONTRAST:  35mL OMNIPAQUE IOHEXOL 300 MG/ML  SOLN COMPARISON:  CT of the abdomen and pelvis 11/14/2021. FINDINGS: Lower chest: Large hiatal hernia. Status post  median sternotomy for aortic valve replacement with a mechanical aortic valve. Pacemaker leads in the right atrium and right ventricle. Mild scarring in the lung bases bilaterally, most evident in the left lower lobe. 3 mm right middle lobe pulmonary nodule (axial image 16 of series 4), unchanged, likely benign. Hepatobiliary: Subcentimeter low-attenuation lesion in segment 2 of the liver (axial image 16 of series 2), stable compared to the prior study, too small to definitively characterize, but statistically likely to represent a tiny cyst. No other suspicious appearing hepatic lesions. 6 mm calcified gallstone lying dependently in the gallbladder. Gallbladder is not distended. No pericholecystic fluid or surrounding inflammatory changes. No intra or extrahepatic biliary ductal dilatation. Pancreas: 7 mm low-attenuation lesion in the superior aspect of the body of the pancreas (axial image 23 of series 2). No solid pancreatic mass. No pancreatic ductal dilatation. No peripancreatic fluid collections or inflammatory changes. Spleen: Unremarkable. Adrenals/Urinary Tract: Right kidney and bilateral adrenal glands are normal in appearance. Subcentimeter low-attenuation lesions in the left kidney, too small to definitively characterize, but similar to prior study and statistically likely to represent tiny cysts (no imaging follow-up  recommended). No hydroureteronephrosis. Urinary bladder is partially obscured by beam hardening artifact from the patient's right hip arthroplasty, but visualized portions are unremarkable. Stomach/Bowel: The appearance of the intra-abdominal portion of the stomach is unremarkable. No pathologic dilatation of small bowel or colon. The appendix is not confidently identified and may be surgically absent. Regardless, there are no inflammatory changes noted adjacent to the cecum to suggest the presence of an acute appendicitis at this time. Vascular/Lymphatic: Aortic atherosclerosis with fusiform aneurysmal dilatation of the infrarenal abdominal aorta which measures up to 3.8 cm in diameter (previously 3.6 cm). No lymphadenopathy noted in the abdomen or pelvis. Reproductive: Status post hysterectomy. Ovaries are not confidently identified may be surgically absent or atrophic. Other: No significant volume of ascites.  No pneumoperitoneum. Musculoskeletal: Status post right hip arthroplasty. There are no aggressive appearing lytic or blastic lesions noted in the visualized portions of the skeleton. IMPRESSION: 1. No definite acute findings noted in the abdomen or pelvis to account for the patient's symptoms. 2. Cholelithiasis without evidence of acute cholecystitis. 3. 7 mm low-attenuation lesion in the superior aspect of the body of the pancreas, nonspecific, but statistically likely a benign pancreatic pseudocyst or side branch IPMN (intraductal papillary mucinous neoplasm). Follow-up abdominal MRI with and without IV gadolinium with MRCP or pancreatic protocol CT scan is recommended in 2 years to ensure the stability of this finding. This recommendation follows ACR consensus guidelines: Management of Incidental Pancreatic Cysts: A White Paper of the ACR Incidental Findings Committee. J Am Coll Radiol 2017;14:911-923. 4. Mild enlargement of fusiform infrarenal abdominal aortic aneurysm which currently measures 3.8 cm in  diameter (previously 3.6 cm). Recommend follow-up ultrasound every 2 years. This recommendation follows ACR consensus guidelines: White Paper of the ACR Incidental Findings Committee II on Vascular Findings. J Am Coll Radiol 2013; 10:789-794. 5. Large hiatal hernia. 6. Additional incidental findings, as above. Electronically Signed   By: Trudie Reed M.D.   On: 10/21/2022 07:00   CT HEAD WO CONTRAST ( )  Result Date: 10/20/2022 CLINICAL DATA:  TIA, altered mental status.  Slurred speech. EXAM: CT HEAD WITHOUT CONTRAST TECHNIQUE: Contiguous axial images were obtained from the base of the skull through the vertex without intravenous contrast. RADIATION DOSE REDUCTION: This exam was performed according to the departmental dose-optimization program which includes automated exposure control, adjustment of the mA and/or kV  according to patient size and/or use of iterative reconstruction technique. COMPARISON:  11/14/2021 FINDINGS: Brain: There is atrophy and chronic small vessel disease changes. No acute intracranial abnormality. Specifically, no hemorrhage, hydrocephalus, mass lesion, acute infarction, or significant intracranial injury. Vascular: No hyperdense vessel or unexpected calcification. Skull: No acute calvarial abnormality. Sinuses/Orbits: No acute findings Other: None IMPRESSION: Atrophy, chronic microvascular disease. No acute intracranial abnormality. Electronically Signed   By: Charlett Nose M.D.   On: 10/20/2022 18:14    DISCHARGE EXAMINATION: Vitals:   11/03/22 1947 11/03/22 2317 11/04/22 0337 11/04/22 0816  BP: 137/65 (!) 117/52 (!) 134/59 (!) 145/83  Pulse: 93 71 72 79  Resp: 16 16 16 17   Temp: 98.2 F (36.8 C) 98.3 F (36.8 C) 98.3 F (36.8 C) 98 F (36.7 C)  TempSrc: Oral Oral Oral Oral  SpO2: 97% 96% 98% 100%  Weight:      Height:       General appearance: Awake alert.  In no distress.  Pleasantly confused Resp: Clear to auscultation bilaterally.  Normal effort Cardio:  S1-S2 is normal regular.  No S3-S4.  No rubs murmurs or bruit GI: Abdomen is soft.  Nontender nondistended.  Bowel sounds are present normal.  No masses organomegaly   DISPOSITION: SNF  Discharge Instructions     Call MD for:  difficulty breathing, headache or visual disturbances   Complete by: As directed    Call MD for:  extreme fatigue   Complete by: As directed    Call MD for:  persistant dizziness or light-headedness   Complete by: As directed    Call MD for:  persistant nausea and vomiting   Complete by: As directed    Call MD for:  severe uncontrolled pain   Complete by: As directed    Call MD for:  temperature >100.4   Complete by: As directed    Discharge instructions   Complete by: As directed    Please review instructions on the discharge summary  You were cared for by a hospitalist during your hospital stay. If you have any questions about your discharge medications or the care you received while you were in the hospital after you are discharged, you can call the unit and asked to speak with the hospitalist on call if the hospitalist that took care of you is not available. Once you are discharged, your primary care physician will handle any further medical issues. Please note that NO REFILLS for any discharge medications will be authorized once you are discharged, as it is imperative that you return to your primary care physician (or establish a relationship with a primary care physician if you do not have one) for your aftercare needs so that they can reassess your need for medications and monitor your lab values. If you do not have a primary care physician, you can call [phone] for a physician referral.   Increase activity slowly   Complete by: As directed           Allergies as of 11/04/2022       Reactions   Codeine Nausea And Vomiting, Other (See Comments)   Patient gets violently ill   Morphine And Related Nausea And Vomiting, Other (See Comments)   Patient  get violently ill        Medication List     TAKE these medications    acetaminophen 500 MG tablet Commonly known as: TYLENOL Take 1,000 mg by mouth every 8 (eight) hours as needed for mild pain  or moderate pain.   alendronate 70 MG tablet Commonly known as: FOSAMAX Take 1 tablet (70 mg total) by mouth once a week. No food or other medications for 30 min. Avoid laying flat for two hours. What changed: when to take this   amoxicillin 500 MG tablet Commonly known as: AMOXIL Take 2,000 mg by mouth See admin instructions. Take 2,000mg  by mouth 1 hour prior to dental appointments   aspirin EC 81 MG tablet Commonly known as: Aspirin Low Dose Take 81 mg twice daily for 4 weeks and then once daily as before What changed:  how much to take how to take this when to take this additional instructions   atorvastatin 40 MG tablet Commonly known as: LIPITOR TAKE 1 TABLET BY MOUTH DAILY FOR CHOLESTEROL What changed: See the new instructions.   brimonidine 0.2 % ophthalmic solution Commonly known as: ALPHAGAN Place 1 drop into the left eye 2 (two) times daily.   Caltrate 600+D Plus Minerals 600-800 MG-UNIT Tabs Take 1 tablet by mouth twice daily for calcium and vitamin D. What changed:  how much to take how to take this when to take this additional instructions   docusate sodium 100 MG capsule Commonly known as: COLACE Take 1 capsule (100 mg total) by mouth 2 (two) times daily.   feeding supplement Liqd Take 237 mLs by mouth 3 (three) times daily between meals.   fluticasone 50 MCG/ACT nasal spray Commonly known as: FLONASE PLACE 1 SPRAY INTO BOTH NOSTRILS DAILY   fluticasone-salmeterol 45-21 MCG/ACT inhaler Commonly known as: ADVAIR HFA Inhale 1 puff into the lungs 2 (two) times daily.   furosemide 40 MG tablet Commonly known as: LASIX TAKE 1 TABLET BY MOUTH DAILY   guaiFENesin-dextromethorphan 100-10 MG/5ML syrup Commonly known as: ROBITUSSIN DM Take 10 mLs by  mouth every 4 (four) hours as needed for cough.   HYDROmorphone 2 MG tablet Commonly known as: DILAUDID Take 0.5-1 tablets (1-2 mg total) by mouth every 6 (six) hours as needed for severe pain.   levothyroxine 50 MCG tablet Commonly known as: SYNTHROID TAKE 1 TABLET BY MOUTH EVERY MORNING ON AN EMPTY STOMACH WITH WATER ONLY. NO FOOD OR OTHER MEDICATIONS FOR 30 MINS. What changed: See the new instructions.   losartan 100 MG tablet Commonly known as: COZAAR TAKE 1 TABLET BY MOUTH DAILY FOR BLOOD PRESSURE   polyethylene glycol 17 g packet Commonly known as: MIRALAX / GLYCOLAX Take 17 g by mouth 2 (two) times daily.   potassium chloride 10 MEQ tablet Commonly known as: KLOR-CON M TAKE 2 TABLETS BY MOUTH 2 TIMES DAILY   QUEtiapine 25 MG tablet Commonly known as: SEROQUEL Take 1 tablet (25 mg total) by mouth at bedtime.          Follow-up Information     Durene Romans, MD. Schedule an appointment as soon as possible for a visit in 2 week(s).   Specialty: Orthopedic Surgery Contact information: [address] [place] [address] [place] [address] [phone]                 TOTAL DISCHARGE TIME: 35 minutes  Kishaun Erekson Rito Ehrlich  [facility] Pager on [email or web address]  11/04/2022, 9:42 AM

## 2022-11-04 NOTE — Progress Notes (Signed)
Mobility Specialist Progress Note   11/04/22 1000  Mobility  Activity Ambulated with assistance in room (mobility squad assisted)  Level of Assistance Contact guard assist, steadying assist  Assistive Device Front wheel walker  Distance Ambulated (ft) 30 ft  Range of Motion/Exercises Active;All extremities  LLE Weight Bearing WBAT  Activity Response Tolerated well   Patient received in supine with NT present, just finished receiving bath. Agreeable to participate. Required more physical assistance this session secondary to pain. Needed up to mod A for bed mobility + cues to use bed rail. Stood with min A and ambulated short distance in room at min guard with steady gait. Distance significantly limited compared to past session secondary to fatigue and "tiredness". Returned to bed without complaint or incident. Was left in supine with all needs met, call bell in reach.   [name], BS EXP Mobility Specialist Please contact via SecureChat or Rehab office at [phone]

## 2022-11-05 NOTE — Progress Notes (Signed)
Patient seen and examined at bedside.  Laying in the bed no complaints.  Patient was recently admitted at the end of December for acute metabolic encephalopathy secondary to COVID-19 infection.  During this admission patient was found to have a left hip fracture secondary to pain.  Underwent ORIF on 1/7, PT/OT recommended SNF.  Due to recurrent left-sided facial weakness, aphasia patient was transferred to [facility] for MRI but pacemaker was thought to be unsafe.  Patient was placed on aspirin, statin, echocardiogram was unremarkable.  Patient will be going to SNF.  Discharge summary completed by Dr. Maryland Pink on 1/11.  No further changes. Transferred to [facility], discontinue telemetry  Gerlean Ren MD [facility]

## 2022-11-05 NOTE — TOC Progression Note (Signed)
Transition of Care [facility]) - Progression Note    Patient Details  Name: [name] MRN: [id number] Date of Birth: 1934-08-19  Transition of Care [facility]) CM/SW Contact  Jinger Neighbors, [place] Phone Number: 11/05/2022, 4:26 PM  Clinical Narrative:     Pt's Josem Kaufmann was approved late in day. Called [facility] to inquire about admissions. Vivien Rota- Director advised to call on 1/13 for bed availability, as they have discharges tomorrow.   Expected Discharge Plan: [place]    Expected Discharge Plan and Services       Living arrangements for the past 2 months: [place] [facility] Expected Discharge Date: 11/04/22                                     Social Determinants of Health (SDOH) Interventions SDOH Screenings   Food Insecurity: No Food Insecurity (10/30/2022)  Housing: Low Risk  (10/30/2022)  Transportation Needs: No Transportation Needs (10/30/2022)  Utilities: Not At Risk (10/30/2022)  Depression (PHQ2-9): Low Risk  (09/09/2021)  Financial Resource Strain: Low Risk  (06/26/2019)  Physical Activity: Inactive (06/26/2019)  Stress: No Stress Concern Present (06/26/2019)  Tobacco Use: Low Risk  (11/01/2022)    Readmission Risk Interventions     No data to display

## 2022-11-05 NOTE — Progress Notes (Addendum)
Mobility Specialist Progress Note   11/05/22 1205  Mobility  Activity Transferred from bed to chair  Level of Assistance Minimal assist, patient does 75% or more  Assistive Device Other (Comment) (HHA)  Distance Ambulated (ft) 2 ft  Range of Motion/Exercises Active;All extremities  LLE Weight Bearing WBAT  Activity Response Tolerated well   Patient received in supine and reluctant to participate. Presented with increased confusion on this date and unable to complete thoughts and sentences. Agreeable to participate with encouragement but deferred ambulation for unspecified reasons. Opted to transfer to recliner chair. Pain and activity tolerance improved from prior session. Was independent for bed mobility and required min A to stand and take steps to recliner while holding the arms of Probation officer. Tolerated without complaint or incident. Was left in recliner with all needs met, call bell in reach and chair alarm set.   [name], BS EXP Mobility Specialist Please contact via SecureChat or Rehab office at [phone]

## 2022-11-05 NOTE — Progress Notes (Signed)
Mobility Specialist Progress Note   11/05/22 1625  Mobility  Activity Dangled on edge of bed  Level of Assistance Minimal assist, patient does 75% or more  Assistive Device None  Range of Motion/Exercises Active;All extremities  Activity Response Tolerated well   Writer was stopped in hallway by dietary stating patient was requesting assistance to EOB for dinner. Patient received in bed partially dangle. Assisted patient to a completed seated position and set up dinner tray. Was left with all needs met, call bell in reach.   [name], BS EXP Mobility Specialist Please contact via SecureChat or Rehab office at [phone]

## 2022-11-06 NOTE — Progress Notes (Addendum)
Messages left for [facility] admissions re bed availability today, await response. Will provide updates as available.   UPDATE 1300: spoke to Vivien Rota at [facility] who reports they do not have bed availability today. Will check tomorrow morning and provide update.  Wandra Feinstein, MSW, LCSW [phone] (coverage)

## 2022-11-06 NOTE — Plan of Care (Signed)
  Problem: Clinical Measurements: Goal: Ability to maintain clinical measurements within normal limits will improve Outcome: Progressing Goal: Will remain free from infection Outcome: Progressing Goal: Diagnostic test results will improve Outcome: Progressing Goal: Respiratory complications will improve Outcome: Progressing Goal: Cardiovascular complication will be avoided Outcome: Progressing   Problem: Activity: Goal: Risk for activity intolerance will decrease Outcome: Progressing   Problem: Coping: Goal: Level of anxiety will decrease Outcome: Progressing   Problem: Elimination: Goal: Will not experience complications related to bowel motility Outcome: Progressing Goal: Will not experience complications related to urinary retention Outcome: Progressing   Problem: Pain Managment: Goal: General experience of comfort will improve Outcome: Progressing   Problem: Safety: Goal: Ability to remain free from injury will improve Outcome: Progressing   Problem: Skin Integrity: Goal: Risk for impaired skin integrity will decrease Outcome: Progressing   Problem: Education: Goal: Knowledge of General Education information will improve Description: Including pain rating scale, medication(s)/side effects and non-pharmacologic comfort measures Outcome: Not Progressing   Problem: Health Behavior/Discharge Planning: Goal: Ability to manage health-related needs will improve Outcome: Not Progressing   Problem: Nutrition: Goal: Adequate nutrition will be maintained Outcome: Not Progressing

## 2022-11-06 NOTE — Progress Notes (Signed)
Laying in the bed, sleeping.  No complaints.  Vitals are overall stable.  Patient was recently admitted at the end of December for acute metabolic encephalopathy secondary to COVID-19 infection.  During this admission patient was found to have a left hip fracture secondary to pain.  Underwent ORIF on 1/7, PT/OT recommended SNF.  Due to recurrent left-sided facial weakness, aphasia patient was transferred to [facility] for MRI but pacemaker was thought to be unsafe.  Patient was placed on aspirin, statin, echocardiogram was unremarkable. Awaiting SNF placement TOC following  Discharge summary completed by Dr. Maryland Pink on 1/11.  No further changes Currently on MedSurg, telemetry discontinued.  Gerlean Ren MD [facility]

## 2022-11-07 NOTE — Progress Notes (Addendum)
Message left for [facility] admissions requesting return call re bed availability today. Will provide updates as available.   UPDATE 1045: per Vivien Rota at [facility], they will not have bed available for pt until Monday.   Wandra Feinstein, MSW, LCSW [phone] (coverage)

## 2022-11-07 NOTE — Progress Notes (Signed)
Seen and examined at bedside, getting ready to eat her breakfast.  No complaints. RN at bedside.  Patient was recently admitted at the end of December for acute metabolic encephalopathy secondary to COVID-19 infection.  During this admission patient was found to have a left hip fracture secondary to pain.  Underwent ORIF on 1/7, PT/OT recommended SNF.  Due to recurrent left-sided facial weakness, aphasia patient was transferred to [facility] for MRI but pacemaker was thought to be unsafe.  Patient was placed on aspirin, statin, echocardiogram was unremarkable. Awaiting SNF placement TOC following  Discharge summary completed by Dr. Maryland Pink on 1/11.  No further changes Remained stable DNR form in the chart  Gerlean Ren MD [facility]

## 2022-11-08 DIAGNOSIS — R296 Repeated falls: Secondary | ICD-10-CM | POA: Diagnosis not present

## 2022-11-08 DIAGNOSIS — I482 Chronic atrial fibrillation, unspecified: Secondary | ICD-10-CM | POA: Diagnosis not present

## 2022-11-08 DIAGNOSIS — F03B18 Unspecified dementia, moderate, with other behavioral disturbance: Secondary | ICD-10-CM | POA: Diagnosis not present

## 2022-11-08 DIAGNOSIS — E46 Unspecified protein-calorie malnutrition: Secondary | ICD-10-CM | POA: Diagnosis not present

## 2022-11-08 DIAGNOSIS — R404 Transient alteration of awareness: Secondary | ICD-10-CM | POA: Diagnosis not present

## 2022-11-08 DIAGNOSIS — N1831 Chronic kidney disease, stage 3a: Secondary | ICD-10-CM | POA: Diagnosis not present

## 2022-11-08 DIAGNOSIS — I5030 Unspecified diastolic (congestive) heart failure: Secondary | ICD-10-CM | POA: Diagnosis not present

## 2022-11-08 DIAGNOSIS — I25709 Atherosclerosis of coronary artery bypass graft(s), unspecified, with unspecified angina pectoris: Secondary | ICD-10-CM | POA: Diagnosis not present

## 2022-11-08 DIAGNOSIS — G309 Alzheimer's disease, unspecified: Secondary | ICD-10-CM | POA: Diagnosis not present

## 2022-11-08 DIAGNOSIS — E782 Mixed hyperlipidemia: Secondary | ICD-10-CM | POA: Diagnosis not present

## 2022-11-08 DIAGNOSIS — E039 Hypothyroidism, unspecified: Secondary | ICD-10-CM | POA: Diagnosis not present

## 2022-11-08 DIAGNOSIS — I110 Hypertensive heart disease with heart failure: Secondary | ICD-10-CM | POA: Diagnosis not present

## 2022-11-08 DIAGNOSIS — S72002D Fracture of unspecified part of neck of left femur, subsequent encounter for closed fracture with routine healing: Secondary | ICD-10-CM | POA: Diagnosis not present

## 2022-11-08 DIAGNOSIS — R269 Unspecified abnormalities of gait and mobility: Secondary | ICD-10-CM | POA: Diagnosis not present

## 2022-11-08 DIAGNOSIS — Z743 Need for continuous supervision: Secondary | ICD-10-CM | POA: Diagnosis not present

## 2022-11-08 DIAGNOSIS — E785 Hyperlipidemia, unspecified: Secondary | ICD-10-CM | POA: Diagnosis not present

## 2022-11-08 DIAGNOSIS — I10 Essential (primary) hypertension: Secondary | ICD-10-CM | POA: Diagnosis not present

## 2022-11-08 DIAGNOSIS — E876 Hypokalemia: Secondary | ICD-10-CM | POA: Diagnosis not present

## 2022-11-08 DIAGNOSIS — I639 Cerebral infarction, unspecified: Secondary | ICD-10-CM | POA: Diagnosis not present

## 2022-11-08 DIAGNOSIS — S72002A Fracture of unspecified part of neck of left femur, initial encounter for closed fracture: Secondary | ICD-10-CM | POA: Diagnosis not present

## 2022-11-08 DIAGNOSIS — J45909 Unspecified asthma, uncomplicated: Secondary | ICD-10-CM | POA: Diagnosis not present

## 2022-11-08 DIAGNOSIS — Z7401 Bed confinement status: Secondary | ICD-10-CM | POA: Diagnosis not present

## 2022-11-08 DIAGNOSIS — S72009D Fracture of unspecified part of neck of unspecified femur, subsequent encounter for closed fracture with routine healing: Secondary | ICD-10-CM | POA: Diagnosis not present

## 2022-11-08 NOTE — Progress Notes (Signed)
Report called to [facility]. All questions answered. PTAR picking up pt now.

## 2022-11-08 NOTE — Progress Notes (Signed)
Feels okay no complaints.  Patient was recently admitted at the end of December for acute metabolic encephalopathy secondary to COVID-19 infection.  During this admission patient was found to have a left hip fracture secondary to pain.  Underwent ORIF on 1/7, PT/OT recommended SNF.  Due to recurrent left-sided facial weakness, aphasia patient was transferred to [facility] for MRI but pacemaker was thought to be unsafe.  Patient was placed on aspirin, statin, echocardiogram was unremarkable. TOC following, awaiting SNF placement  Discharge summary completed by Dr. Maryland Pink on 1/11.  No further changes Remained stable DNR form in the chart  Gerlean Ren MD [facility]

## 2022-11-08 NOTE — Progress Notes (Signed)
Physical Therapy Treatment Patient Details Name: [name] MRN: [id number] DOB: 06/22/1934 Today's Date: 11/08/2022   History of Present Illness 89 y/o female presented to ED on 10/30/22 for evaluation of left hip pain, recurrent left-sided weakness, and expressive aphasia. She had 2 falls in 24 hours. CT head negative; unable to have MRI due to pacemaker; +left femoral neck fx, s/p Left total hip replacement through an anterior approach 10/31/22;  PMH: AAA, CAD s/p CABG, HTN, asthma, dementia, CKD, pacemaker, CVA, CHF, AVR, afib not on anticoagulation,    PT Comments    Therapy session focusing on transfer training (min assist for sit<>stand from various surfaces,) step pivot transfers (min assist,)  and gait training with rollator. Notes indicate pt may feel more comfortable with this AD. She did tolerated ambulating well, shows some difficulty with rollator control needing min assist. No buckling noted. Cues throughout for direction and use. Patient will continue to benefit from skilled physical therapy services to further improve independence with functional mobility.    Recommendations for follow up therapy are one component of a multi-disciplinary discharge planning process, led by the attending physician.  Recommendations may be updated based on patient status, additional functional criteria and insurance authorization.  Follow Up Recommendations  Skilled nursing-short term rehab (<3 hours/day) Can patient physically be transported by private vehicle: Yes   Assistance Recommended at Discharge Frequent or constant Supervision/Assistance  Patient can return home with the following A little help with walking and/or transfers;A little help with bathing/dressing/bathroom;Direct supervision/assist for medications management;Direct supervision/assist for financial management   Equipment Recommendations  None recommended by PT    Recommendations for Other Services       Precautions /  Restrictions Precautions Precautions: Fall Restrictions Weight Bearing Restrictions: Yes LLE Weight Bearing: Weight bearing as tolerated     Mobility  Bed Mobility Overal bed mobility: Needs Assistance Bed Mobility: Supine to Sit     Supine to sit: Min guard     General bed mobility comments: incr time and effort, but no physical assist. Cues to facilitate    Transfers Overall transfer level: Needs assistance Equipment used: Rollator (4 wheels) Transfers: Sit to/from Stand Sit to Stand: Min assist   Step pivot transfers: Min assist       General transfer comment: Min assist for boost to stand from bed and BSC using 4WW for support upon standing. Min assist for stand and step pivot transfer from bed to [facility] during session.    Ambulation/Gait Ambulation/Gait assistance: Min assist Gait Distance (Feet): 100 Feet Assistive device: Rollator (4 wheels), 1 person hand held assist Gait Pattern/deviations: Step-through pattern, Decreased step length - left, Shuffle, Drifts right/left, Trunk flexed Gait velocity: decr Gait velocity interpretation: <1.31 ft/sec, indicative of household ambulator   General Gait Details: Educated on rollator use (notes indicate pt uses this at baseline and may be more familiar. She was able to navigate with min assist, occasionally around objects in room and hallway. Demonstrates drifting, often self corrects but rarely in a straight path. Cues for awareness, min assist intermittently for control. Trunk flexed, not responding to posture cues. Amb short distance in room with HHA (no room for RW) min assist for balance, using furniture for support.   Stairs             Wheelchair Mobility    Modified Rankin (Stroke Patients Only)       Balance Overall balance assessment: Needs assistance Sitting-balance support: No upper extremity supported, Feet supported Sitting balance-Leahy  Scale: Good Sitting balance - Comments: sits EOB, crossese  legs, sits BSC no support.   Standing balance support: Bilateral upper extremity supported, Reliant on assistive device for balance Standing balance-Leahy Scale: Poor Standing balance comment: also history of 2 recent falls; unwitnessed and family does not know what happened                            Cognition Arousal/Alertness: Awake/alert Behavior During Therapy: Flat affect Overall Cognitive Status: No family/caregiver present to determine baseline cognitive functioning Area of Impairment: Attention, Memory, Following commands, Safety/judgement, Awareness, Problem solving                 Orientation Level: Disoriented to, Place, Time, Situation Current Attention Level: Sustained Memory: Decreased short-term memory Following Commands: Follows one step commands with increased time Safety/Judgement: Decreased awareness of safety, Decreased awareness of deficits Awareness:  (pre-intellectual) Problem Solving: Difficulty sequencing, Requires verbal cues, Requires tactile cues General Comments: cognitive deficits at baseline, and daughter reports worsening over the last few months, acutely a significant difference;        Exercises      General Comments General comments (skin integrity, edema, etc.): Difficulty expressing need to use BSC. Stating "Pen" and "sit" - nods yes when [facility] presented.      Pertinent Vitals/Pain Pain Assessment Pain Assessment: PAINAD Breathing: normal Negative Vocalization: none Facial Expression: sad, frightened, frown Body Language: relaxed Consolability: no need to console PAINAD Score: 1 Pain Location: n/a Pain Descriptors / Indicators:  (unable) Pain Intervention(s): Monitored during session    Home Living                          Prior Function            PT Goals (current goals can now be found in the care plan section) Acute Rehab PT Goals Patient Stated Goal: unable to state; daughter wants her to work on  walking and balance PT Goal Formulation: With family Time For Goal Achievement: 11/15/22 Potential to Achieve Goals: Good Progress towards PT goals: Progressing toward goals    Frequency    Min 3X/week      PT Plan Current plan remains appropriate    Co-evaluation              AM-PAC PT "6 Clicks" Mobility   Outcome Measure  Help needed turning from your back to your side while in a flat bed without using bedrails?: A Little Help needed moving from lying on your back to sitting on the side of a flat bed without using bedrails?: A Little Help needed moving to and from a bed to a chair (including a wheelchair)?: A Little Help needed standing up from a chair using your arms (e.g., wheelchair or bedside chair)?: A Little Help needed to walk in hospital room?: A Little Help needed climbing 3-5 steps with a railing? : A Lot 6 Click Score: 17    End of Session Equipment Utilized During Treatment: Gait belt Activity Tolerance: Patient tolerated treatment well Patient left: with call bell/phone within reach;in chair;with chair alarm set;with SCD's reapplied Nurse Communication: Mobility status PT Visit Diagnosis: Repeated falls (R29.6);Muscle weakness (generalized) (M62.81)     Time: 7341-9379 PT Time Calculation (min) (ACUTE ONLY): 17 min  Charges:  $Gait Training: 8-22 mins  Kathlyn Sacramento, PT, DPT Physical Therapist Acute Rehabilitation Services [facility] Outpatient Rehabilitation Services [facility]    Berton Mount 11/08/2022, 12:22 PM

## 2022-11-08 NOTE — TOC Transition Note (Signed)
Transition of Care [facility]) - CM/SW Discharge Note   Patient Details  Name: SACHA TOPOR MRN: [id number] Date of Birth: 1933-11-10  Transition of Care [facility]) CM/SW Contact:  Jinger Neighbors, LCSW Phone Number: 11/08/2022, 1:06 PM   Clinical Narrative:     Pt going to [facility] via [facility] Call to report: [phone] Room #: 9622   Final next level of care: Skilled Nursing Facility Barriers to Discharge: No Barriers Identified   Patient Goals and CMS Choice CMS Medicare.gov Compare Post Acute Care list provided to:: Patient Represenative (must comment) (pt's dtr) Choice offered to / list presented to : Adult Children  Discharge Placement                Patient chooses bed at: [facility] Patient to be transferred to facility by: [place] Name of family member notified: Sharyn Lull Patient and family notified of of transfer: 11/08/22  Discharge Plan and Services Additional resources added to the After Visit Summary for                                       Social Determinants of Health (SDOH) Interventions SDOH Screenings   Food Insecurity: No Food Insecurity (10/30/2022)  Housing: Low Risk  (10/30/2022)  Transportation Needs: No Transportation Needs (10/30/2022)  Utilities: Not At Risk (10/30/2022)  Depression (PHQ2-9): Low Risk  (09/09/2021)  Financial Resource Strain: Low Risk  (06/26/2019)  Physical Activity: Inactive (06/26/2019)  Stress: No Stress Concern Present (06/26/2019)  Tobacco Use: Low Risk  (11/01/2022)     Readmission Risk Interventions     No data to display

## 2022-11-09 DIAGNOSIS — N1831 Chronic kidney disease, stage 3a: Secondary | ICD-10-CM | POA: Diagnosis not present

## 2022-11-09 DIAGNOSIS — I25709 Atherosclerosis of coronary artery bypass graft(s), unspecified, with unspecified angina pectoris: Secondary | ICD-10-CM | POA: Diagnosis not present

## 2022-11-09 DIAGNOSIS — I10 Essential (primary) hypertension: Secondary | ICD-10-CM | POA: Diagnosis not present

## 2022-11-09 DIAGNOSIS — S72009D Fracture of unspecified part of neck of unspecified femur, subsequent encounter for closed fracture with routine healing: Secondary | ICD-10-CM | POA: Diagnosis not present

## 2022-11-09 DIAGNOSIS — I482 Chronic atrial fibrillation, unspecified: Secondary | ICD-10-CM | POA: Diagnosis not present

## 2022-11-09 DIAGNOSIS — J45909 Unspecified asthma, uncomplicated: Secondary | ICD-10-CM | POA: Diagnosis not present

## 2022-11-09 DIAGNOSIS — E876 Hypokalemia: Secondary | ICD-10-CM | POA: Diagnosis not present

## 2022-11-09 DIAGNOSIS — G309 Alzheimer's disease, unspecified: Secondary | ICD-10-CM | POA: Diagnosis not present

## 2022-11-09 DIAGNOSIS — E782 Mixed hyperlipidemia: Secondary | ICD-10-CM | POA: Diagnosis not present

## 2022-11-09 DIAGNOSIS — I5030 Unspecified diastolic (congestive) heart failure: Secondary | ICD-10-CM | POA: Diagnosis not present

## 2022-11-09 DIAGNOSIS — I639 Cerebral infarction, unspecified: Secondary | ICD-10-CM | POA: Diagnosis not present

## 2022-11-09 DIAGNOSIS — E039 Hypothyroidism, unspecified: Secondary | ICD-10-CM | POA: Diagnosis not present

## 2022-11-11 ENCOUNTER — Other Ambulatory Visit: Payer: Self-pay | Admitting: *Deleted

## 2022-11-11 DIAGNOSIS — I10 Essential (primary) hypertension: Secondary | ICD-10-CM | POA: Diagnosis not present

## 2022-11-11 DIAGNOSIS — E782 Mixed hyperlipidemia: Secondary | ICD-10-CM | POA: Diagnosis not present

## 2022-11-11 DIAGNOSIS — N1831 Chronic kidney disease, stage 3a: Secondary | ICD-10-CM | POA: Diagnosis not present

## 2022-11-11 DIAGNOSIS — I5030 Unspecified diastolic (congestive) heart failure: Secondary | ICD-10-CM | POA: Diagnosis not present

## 2022-11-11 DIAGNOSIS — S72009D Fracture of unspecified part of neck of unspecified femur, subsequent encounter for closed fracture with routine healing: Secondary | ICD-10-CM | POA: Diagnosis not present

## 2022-11-11 DIAGNOSIS — E039 Hypothyroidism, unspecified: Secondary | ICD-10-CM | POA: Diagnosis not present

## 2022-11-11 DIAGNOSIS — R296 Repeated falls: Secondary | ICD-10-CM | POA: Diagnosis not present

## 2022-11-11 DIAGNOSIS — I482 Chronic atrial fibrillation, unspecified: Secondary | ICD-10-CM | POA: Diagnosis not present

## 2022-11-11 DIAGNOSIS — F03B18 Unspecified dementia, moderate, with other behavioral disturbance: Secondary | ICD-10-CM | POA: Diagnosis not present

## 2022-11-11 NOTE — Patient Outreach (Signed)
[name] resides in [facility] skilled nursing facility. Screening for potential [facility] care coordination services as benefit of insurance plan and Primary Care Provider.  Spoke with Florentina Jenny, Scientist, forensic. [name] to make aware writer is following for potential [facility] care coordination services.   Marthenia Rolling, MSN, RN,BSN [facility] Acute Care Coordinator [phone] (Direct dial)

## 2022-11-12 DIAGNOSIS — E46 Unspecified protein-calorie malnutrition: Secondary | ICD-10-CM | POA: Diagnosis not present

## 2022-11-12 DIAGNOSIS — R269 Unspecified abnormalities of gait and mobility: Secondary | ICD-10-CM | POA: Diagnosis not present

## 2022-11-12 DIAGNOSIS — S72002A Fracture of unspecified part of neck of left femur, initial encounter for closed fracture: Secondary | ICD-10-CM | POA: Diagnosis not present

## 2022-11-12 DIAGNOSIS — G309 Alzheimer's disease, unspecified: Secondary | ICD-10-CM | POA: Diagnosis not present

## 2022-11-16 DIAGNOSIS — I482 Chronic atrial fibrillation, unspecified: Secondary | ICD-10-CM | POA: Diagnosis not present

## 2022-11-16 DIAGNOSIS — S72009D Fracture of unspecified part of neck of unspecified femur, subsequent encounter for closed fracture with routine healing: Secondary | ICD-10-CM | POA: Diagnosis not present

## 2022-11-16 DIAGNOSIS — I10 Essential (primary) hypertension: Secondary | ICD-10-CM | POA: Diagnosis not present

## 2022-11-16 DIAGNOSIS — F03B18 Unspecified dementia, moderate, with other behavioral disturbance: Secondary | ICD-10-CM | POA: Diagnosis not present

## 2022-11-16 DIAGNOSIS — J45909 Unspecified asthma, uncomplicated: Secondary | ICD-10-CM | POA: Diagnosis not present

## 2022-11-16 DIAGNOSIS — I5030 Unspecified diastolic (congestive) heart failure: Secondary | ICD-10-CM | POA: Diagnosis not present

## 2022-11-23 DIAGNOSIS — I110 Hypertensive heart disease with heart failure: Secondary | ICD-10-CM | POA: Diagnosis not present

## 2022-11-23 DIAGNOSIS — I5030 Unspecified diastolic (congestive) heart failure: Secondary | ICD-10-CM | POA: Diagnosis not present

## 2022-11-23 DIAGNOSIS — J45909 Unspecified asthma, uncomplicated: Secondary | ICD-10-CM | POA: Diagnosis not present

## 2022-11-23 DIAGNOSIS — S72009D Fracture of unspecified part of neck of unspecified femur, subsequent encounter for closed fracture with routine healing: Secondary | ICD-10-CM | POA: Diagnosis not present

## 2022-11-23 DIAGNOSIS — I482 Chronic atrial fibrillation, unspecified: Secondary | ICD-10-CM | POA: Diagnosis not present

## 2022-11-23 DIAGNOSIS — F03B18 Unspecified dementia, moderate, with other behavioral disturbance: Secondary | ICD-10-CM | POA: Diagnosis not present

## 2022-11-25 ENCOUNTER — Telehealth: Payer: Self-pay | Admitting: Primary Care

## 2022-11-25 NOTE — Telephone Encounter (Signed)
Noted  

## 2022-11-25 NOTE — Telephone Encounter (Signed)
Patient called and stated that she has questions on medications and one of them is the Miralax. Call back number [phone].

## 2022-11-25 NOTE — Telephone Encounter (Signed)
Error

## 2022-11-25 NOTE — Telephone Encounter (Signed)
Added patient on for 11/26/22 @ 12:20pm.

## 2022-11-25 NOTE — Telephone Encounter (Signed)
Called and spoke with patients daughter she stated that the patient had hip replacement surgery recently and she is requesting that the patient discontinue some of the medications that she was started on in the hospital. She states that the [facility] the [facility] will not stop giving her the medications on her list without Anda Kraft removing them and stating she needs to be discontinued off of them. Advised we would need to schedule appt to follow up with her, scheduled next available 11/30/22.   Medications patients daughter would like her to stop:  Seroquel Miralax-  Colace-  Hydromorphone- this is on patients med list but home[facility] hasn't been giving it to her and daughter is okay with that.   Advised I would let her know if we have a cancellation for tomorrow.

## 2022-11-26 ENCOUNTER — Encounter: Payer: Self-pay | Admitting: Primary Care

## 2022-11-26 ENCOUNTER — Ambulatory Visit (INDEPENDENT_AMBULATORY_CARE_PROVIDER_SITE_OTHER): Payer: Medicare Other | Admitting: Primary Care

## 2022-11-26 VITALS — BP 124/68 | HR 100 | Temp 98.4°F | Ht 69.0 in | Wt 152.0 lb

## 2022-11-26 DIAGNOSIS — Z66 Do not resuscitate: Secondary | ICD-10-CM

## 2022-11-26 DIAGNOSIS — Z96642 Presence of left artificial hip joint: Secondary | ICD-10-CM

## 2022-11-26 LAB — BASIC METABOLIC PANEL
BUN: 11 mg/dL (ref 6–23)
CO2: 23 mEq/L (ref 19–32)
Calcium: 8.9 mg/dL (ref 8.4–10.5)
Chloride: 104 mEq/L (ref 96–112)
Creatinine, Ser: 1.17 mg/dL (ref 0.40–1.20)
GFR: 42.33 mL/min — ABNORMAL LOW (ref >60.00)
Glucose, Bld: 114 mg/dL — ABNORMAL HIGH (ref 70–99)
Potassium: 3.6 mEq/L (ref 3.5–5.1)
Sodium: 141 mEq/L (ref 135–145)

## 2022-11-26 LAB — CBC
HCT: 32.6 % — ABNORMAL LOW (ref 36.0–46.0)
Hemoglobin: 11 g/dL — ABNORMAL LOW (ref 12.0–15.0)
MCHC: 33.6 g/dL (ref 30.0–36.0)
MCV: 94.4 fl (ref 78.0–100.0)
Platelets: 263 10*3/uL (ref 150.0–400.0)
RBC: 3.45 Mil/uL — ABNORMAL LOW (ref 3.87–5.11)
RDW: 16.5 % — ABNORMAL HIGH (ref 11.5–15.5)
WBC: 3.2 10*3/uL — ABNORMAL LOW (ref 4.0–10.5)

## 2022-11-26 NOTE — Progress Notes (Signed)
Subjective:    Patient ID: [name], female    DOB: 29-Jan-1934, 89 y.o.   MRN: [id number]  HPI  [name] is a very pleasant 89 y.o. female with a his significant medical history including CAD, hypertension, CVA, heart block with pacemaker, CKD, hyperlipidemia, osteoarthritis of the hip, hip fracture who presents today for hospital follow-up.  Her daughter is here providing information for HPI.   She originally presented to our office on 10/20/2022 with her granddaughter for acute confusion and limping to the left lower extremity.  Further workup was required including CT head to rule out stroke so it was recommended she proceed to the ED for evaluation.  She presented to [facility] ED the following day with continued confusion, stuttering when speaking, left lower extremity weakness and dragging her left leg.  During her ED visit she tested positive for COVID-19.  She underwent CT head which was negative for stroke.  She underwent chest x-ray which showed consolidation to the left lower lung concerning for bronchopneumonia.  She also underwent CT abdomen pelvis which was without acute findings, but did note a 7 mm lesion to the pancreas with follow-up MRI recommended in 2 years.  Her fusiform and infra renal abdominal aortic aneurysm was slightly enlarged at 3.8 cm, recommendations were for repeat imaging in 2 years.  Symptoms improved with Paxlovid so she was discharged home on 10/22/2022.  She returned to [facility] ED on 10/29/2022 after a fall x 2 within 24 hours at the nursing facility.  The patient was complaining of left hip pain.  She was transferred to [facility] for further evaluation in the event that she needed an MRI for stroke work up.  During her stay at [facility] she underwent x-ray of her left hip which showed acute displaced left femoral neck fracture.  CT head/C-spine was negative.  Neurology consulted who recommended EEG to assess for potential underlying epilepsy.  EEG was negative.   She underwent left total hip replacement on 10/31/2022 per Dr. Alvan Dame.  During her hospital stay she experienced delirium in the setting of dementia and was treated with Seroquel.  She underwent PT.  She was discharged to SNF on 11/08/2022 with recommendations for outpatient orthopedic surgery follow-up. She was at [facility] from 11/08/22 and discharged back to [facility] on 11/25/22.   Evaluated by orthopedic surgery during her SNF stay on 11/17/22. She has another appointment scheduled with Dr. Alvan Dame on 12/16/22. Her daughter mentions that her incision site appears well healed.   Her daughter mentions there is confusion regarding her medication list at the [facility]. Her medications provided in the hospital and her SNF are no longer needed at the [facility] including Dilaudid, Robitussin, Seroquel, and Miralax. She is moving her bowels well and doesn't need Miralax. She is sleeping well without Seroquel. Her cough has resolved and no longer needs Robitussin DM. She hasn't taken Dilaudid since before her surgery.   Overall her left hip feels well. She's notices some myalgias at the site of her surgery which she was told was normal during recovery. She is also needing a DNR completion for [place]. She and her daughter agree.    Review of Systems  Constitutional:  Negative for fever.  Respiratory:  Negative for shortness of breath.   Musculoskeletal:  Positive for myalgias.         Past Medical History:  Diagnosis Date   AAA (abdominal aortic aneurysm) ([place]) 01/03/13  3.5x3.3cm   Alzheimer's dementia (HCC)    "probably middle stage" (11/05/2014)   Anemia    "hx of chronic" (11/05/2014)   Aortic stenosis 03/29/2008   biological prosthetic replacement   Arthritis    "joints" (11/05/2014)   Asthma    Avascular necrosis of bone of hip, right (HCC) 07/28/2018   Chronic asthmatic bronchitis    "q fall and winter" (11/05/2014)    Closed nondisplaced fracture of middle phalanx of lesser toe of right foot 01/05/2021   Coronary artery disease    CABG 03/29/08   Dyslipidemia    Exertional shortness of breath    Family history of adverse reaction to anesthesia    "daughter gets PONV too"   Heart murmur    Hypertension    Hypothyroidism    LBBB (left bundle branch block)    Migraines    "none in years' (11/05/2014)   Mobitz type 2 second degree AV block 10/31/2014   MVP (mitral valve prolapse)    with mild mitral insufficiency/notes 08/17/2013   Near syncope 08/17/2013   PONV (postoperative nausea and vomiting)    Presence of permanent cardiac pacemaker    Sinus pause 10/31/2014   Stroke (HCC) 03/2008   "2 light strokes"; denies residual on 11/05/2014   Urinary frequency     Social History   Socioeconomic History   Marital status: Divorced    Spouse name: Not on file   Number of children: 1   Years of education: Some college   Highest education level: Not on file  Occupational History   Occupation: Retired  Tobacco Use   Smoking status: Never   Smokeless tobacco: Never  Vaping Use   Vaping Use: Never used  Substance and Sexual Activity   Alcohol use: No    Alcohol/week: 0.0 standard drinks of alcohol   Drug use: No   Sexual activity: Not Currently  Other Topics Concern   Not on file  Social History Narrative   Pt lives in single story home with her daughter and son-in-law   Has 1 child   Some college education   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 [employer]    Social Determinants of Health   Financial Resource Strain: Low Risk  (06/26/2019)   Overall Financial Resource Strain (CARDIA)    Difficulty of Paying Living Expenses: Not hard at all  Food Insecurity: No Food Insecurity (10/30/2022)   Hunger Vital Sign    Worried About Running Out of Food in the Last Year: Never true    Ran Out of Food in the Last Year: Never true  Transportation Needs: No Transportation Needs (10/30/2022)    PRAPARE - Administrator, Civil Service (Medical): No    Lack of Transportation (Non-Medical): No  Physical Activity: Inactive (06/26/2019)   Exercise Vital Sign    Days of Exercise per Week: 0 days    Minutes of Exercise per Session: 0 min  Stress: No Stress Concern Present (06/26/2019)   [employer] of Occupational Health - Occupational Stress Questionnaire    Feeling of Stress : Not at all  Social Connections: Not on file  Intimate Partner Violence: Not At Risk (10/30/2022)   Humiliation, Afraid, Rape, and Kick questionnaire    Fear of Current or Ex-Partner: No    Emotionally Abused: No    Physically Abused: No    Sexually Abused: No    Past Surgical History:  Procedure Laterality Date   ABDOMINAL HYSTERECTOMY  1977  AORTIC VALVE REPLACEMENT  03/29/2008   PERICARDIAL TISSUE VALVE   APPENDECTOMY  ?1977   CARDIAC CATHETERIZATION  ~ 2009   CATARACT EXTRACTION W/ INTRAOCULAR LENS IMPLANT Left    CORONARY ARTERY BYPASS GRAFT  03/29/2008   LIMA TO LAD,SVG TO CX,SVG TO LEFT POSTEROLATERAL BRANCH   HIP FRACTURE SURGERY Right 06/2010   "3 metal screws"    INSERT / REPLACE / REMOVE PACEMAKER  11/05/2014   LOOP RECORDER EXPLANT N/A 11/05/2014   Procedure: LOOP RECORDER EXPLANT;  Surgeon: Sanda Klein, MD;  Location: [facility] CATH LAB;  Service: Cardiovascular;  Laterality: N/A;   LOOP RECORDER IMPLANT N/A 09/17/2013   Procedure: LOOP RECORDER IMPLANT;  Surgeon: Sanda Klein, MD;  Location: [facility] CATH LAB;  Service: Cardiovascular;  Laterality: N/A;   PERMANENT PACEMAKER INSERTION N/A 11/05/2014   Procedure: PERMANENT PACEMAKER INSERTION;  Surgeon: Sanda Klein, MD;  Location: [facility] CATH LAB;  Service: Cardiovascular;  Laterality: N/A;   REFRACTIVE SURGERY Bilateral    /notes 04/28/2008 (08/17/2013)   RETINAL DETACHMENT SURGERY Left 1994   Archie Endo 04/10/2008 (08/17/2013)   TIBIAL TUBERCLERPLASTY  11/05/2014   tooth pulled   06/2018   TOTAL HIP ARTHROPLASTY Right 07/31/2018   Procedure:  RIGHT TOTAL HIP ARTHROPLASTY ANTERIOR APPROACH;  Surgeon: Frederik Pear, MD;  Location: [facility];  Service: Orthopedics;  Laterality: Right;   TOTAL HIP ARTHROPLASTY Left 10/31/2022   Procedure: TOTAL HIP ARTHROPLASTY ANTERIOR APPROACH;  Surgeon: Paralee Cancel, MD;  Location: [facility];  Service: Orthopedics;  Laterality: Left;   UTERINE FIBROID SURGERY  74's    Family History  Problem Relation Age of Onset   Heart failure Mother    Diabetes Mother    Hypertension Mother    Hyperlipidemia Mother    Melanoma Grandchild    Colon cancer Neg Hx     Allergies  Allergen Reactions   Codeine Nausea And Vomiting and Other (See Comments)    Patient gets violently ill   Morphine And Related Nausea And Vomiting and Other (See Comments)    Patient get violently ill    Current Outpatient Medications on File Prior to Visit  Medication Sig Dispense Refill   acetaminophen (TYLENOL) 500 MG tablet Take 1,000 mg by mouth every 8 (eight) hours as needed for mild pain or moderate pain.     alendronate (FOSAMAX) 70 MG tablet Take 1 tablet (70 mg total) by mouth once a week. No food or other medications for 30 min. Avoid laying flat for two hours. (Patient taking differently: Take 70 mg by mouth every Sunday. No food or other medications for 30 min. Avoid laying flat for two hours.) 12 tablet 1   amoxicillin (AMOXIL) 500 MG tablet Take 2,000 mg by mouth See admin instructions. Take 2,000mg  by mouth 1 hour prior to dental appointments  1   aspirin EC (ASPIRIN LOW DOSE) 81 MG tablet Take 81 mg twice daily for 4 weeks and then once daily as before 90 tablet 3   atorvastatin (LIPITOR) 40 MG tablet TAKE 1 TABLET BY MOUTH DAILY FOR CHOLESTEROL (Patient taking differently: Take 40 mg by mouth daily.) 90 tablet 2   brimonidine (ALPHAGAN) 0.2 % ophthalmic solution Place 1 drop into the left eye 2 (two) times daily.  3   Calcium Carbonate-Vit D-Min (CALTRATE 600+D PLUS MINERALS) 600-800 MG-UNIT TABS Take 1 tablet by mouth  twice daily for calcium and vitamin D. (Patient taking differently: Take 1 tablet by mouth daily with supper.) 180 tablet 3   fluticasone (FLONASE) 50  MCG/ACT nasal spray PLACE 1 SPRAY INTO BOTH NOSTRILS DAILY 48 g 3   fluticasone-salmeterol (ADVAIR HFA) 45-21 MCG/ACT inhaler Inhale 1 puff into the lungs 2 (two) times daily.     furosemide (LASIX) 40 MG tablet TAKE 1 TABLET BY MOUTH DAILY 90 tablet 0   levothyroxine (SYNTHROID) 50 MCG tablet TAKE 1 TABLET BY MOUTH EVERY MORNING ON AN EMPTY STOMACH WITH WATER ONLY. NO FOOD OR OTHER MEDICATIONS FOR 30 MINS. (Patient taking differently: Take 50 mcg by mouth daily before breakfast. TAKE 1 TABLET BY MOUTH EVERY MORNING ON AN EMPTY STOMACH WITH WATER ONLY. NO FOOD OR OTHER MEDICATIONS FOR 30 MINS.) 90 tablet 3   losartan (COZAAR) 100 MG tablet TAKE 1 TABLET BY MOUTH DAILY FOR BLOOD PRESSURE 90 tablet 3   potassium chloride (KLOR-CON M) 10 MEQ tablet TAKE 2 TABLETS BY MOUTH 2 TIMES DAILY 120 tablet 0   docusate sodium (COLACE) 100 MG capsule Take 1 capsule (100 mg total) by mouth 2 (two) times daily. (Patient not taking: Reported on 11/26/2022) 10 capsule 0   No current facility-administered medications on file prior to visit.    BP 124/68   Pulse 100   Temp 98.4 F (36.9 C) (Temporal)   Ht 5\' 9"  (1.753 m)   Wt 152 lb (68.9 kg)   SpO2 98%   BMI 22.45 kg/m  Objective:   Physical Exam Cardiovascular:     Rate and Rhythm: Normal rate and regular rhythm.  Pulmonary:     Effort: Pulmonary effort is normal.     Breath sounds: Normal breath sounds.  Musculoskeletal:     Cervical back: Neck supple.     Comments: Ambulation is stable with walker.  Skin:    General: Skin is warm and dry.     Findings: No erythema.     Comments: Incision site appears well healed.   Neurological:     Mental Status: She is alert.           Assessment & Plan:  S/P total left hip arthroplasty Assessment & Plan: Recent hospital notes, labs, imaging  reviewed.  Orders placed for physical therapy for [facility] in [place].  Remain off Dilaudid.  Remain off Miralax.  Remain off Seroquel. Remain off Robitussin DM.  DNR form completed today. Patient and daughter are present and agree.  CBC and BMP ordered and pending.  Follow up with orthopedics as scheduled.      Orders: -     CBC -     Basic metabolic panel  DNR (do not resuscitate) Assessment & Plan: Form completed and signed today. Patient and daughter agree.          Pleas Koch, NP

## 2022-11-26 NOTE — Assessment & Plan Note (Signed)
Form completed and signed today. Patient and daughter agree.

## 2022-11-26 NOTE — Assessment & Plan Note (Addendum)
Recent hospital notes, labs, imaging reviewed.  Orders placed for physical therapy for [facility] in [place].  Remain off Dilaudid.  Remain off Miralax.  Remain off Seroquel. Remain off Robitussin DM.  DNR form completed today. Patient and daughter are present and agree.  CBC and BMP ordered and pending.  Follow up with orthopedics as scheduled.

## 2022-11-26 NOTE — Patient Instructions (Signed)
Do not take Miralax, Seroquel, Dilaudid, or Robitussin.   Take the DNR form to the [facility].  Follow up with Dr. Alvan Dame as scheduled.  It was a pleasure to see you today!

## 2022-11-29 ENCOUNTER — Ambulatory Visit (INDEPENDENT_AMBULATORY_CARE_PROVIDER_SITE_OTHER): Payer: Medicare Other

## 2022-11-29 DIAGNOSIS — I441 Atrioventricular block, second degree: Secondary | ICD-10-CM | POA: Diagnosis not present

## 2022-11-30 ENCOUNTER — Inpatient Hospital Stay: Payer: Medicare Other | Admitting: Primary Care

## 2022-11-30 DIAGNOSIS — N183 Chronic kidney disease, stage 3 unspecified: Secondary | ICD-10-CM | POA: Diagnosis not present

## 2022-11-30 DIAGNOSIS — Z96641 Presence of right artificial hip joint: Secondary | ICD-10-CM | POA: Diagnosis not present

## 2022-11-30 DIAGNOSIS — Z79899 Other long term (current) drug therapy: Secondary | ICD-10-CM | POA: Diagnosis not present

## 2022-11-30 DIAGNOSIS — M810 Age-related osteoporosis without current pathological fracture: Secondary | ICD-10-CM | POA: Diagnosis not present

## 2022-11-30 DIAGNOSIS — R350 Frequency of micturition: Secondary | ICD-10-CM | POA: Diagnosis not present

## 2022-11-30 DIAGNOSIS — Z951 Presence of aortocoronary bypass graft: Secondary | ICD-10-CM | POA: Diagnosis not present

## 2022-11-30 DIAGNOSIS — M792 Neuralgia and neuritis, unspecified: Secondary | ICD-10-CM | POA: Diagnosis not present

## 2022-11-30 DIAGNOSIS — F0283 Dementia in other diseases classified elsewhere, unspecified severity, with mood disturbance: Secondary | ICD-10-CM | POA: Diagnosis not present

## 2022-11-30 DIAGNOSIS — I714 Abdominal aortic aneurysm, without rupture, unspecified: Secondary | ICD-10-CM | POA: Diagnosis not present

## 2022-11-30 DIAGNOSIS — Z7982 Long term (current) use of aspirin: Secondary | ICD-10-CM | POA: Diagnosis not present

## 2022-11-30 DIAGNOSIS — D649 Anemia, unspecified: Secondary | ICD-10-CM | POA: Diagnosis not present

## 2022-11-30 DIAGNOSIS — I2510 Atherosclerotic heart disease of native coronary artery without angina pectoris: Secondary | ICD-10-CM | POA: Diagnosis not present

## 2022-11-30 DIAGNOSIS — I447 Left bundle-branch block, unspecified: Secondary | ICD-10-CM | POA: Diagnosis not present

## 2022-11-30 DIAGNOSIS — Z950 Presence of cardiac pacemaker: Secondary | ICD-10-CM | POA: Diagnosis not present

## 2022-11-30 DIAGNOSIS — E039 Hypothyroidism, unspecified: Secondary | ICD-10-CM | POA: Diagnosis not present

## 2022-11-30 DIAGNOSIS — E785 Hyperlipidemia, unspecified: Secondary | ICD-10-CM | POA: Diagnosis not present

## 2022-11-30 DIAGNOSIS — G43909 Migraine, unspecified, not intractable, without status migrainosus: Secondary | ICD-10-CM | POA: Diagnosis not present

## 2022-11-30 DIAGNOSIS — Z952 Presence of prosthetic heart valve: Secondary | ICD-10-CM | POA: Diagnosis not present

## 2022-11-30 DIAGNOSIS — M1612 Unilateral primary osteoarthritis, left hip: Secondary | ICD-10-CM | POA: Diagnosis not present

## 2022-11-30 DIAGNOSIS — G309 Alzheimer's disease, unspecified: Secondary | ICD-10-CM | POA: Diagnosis not present

## 2022-11-30 DIAGNOSIS — I441 Atrioventricular block, second degree: Secondary | ICD-10-CM | POA: Diagnosis not present

## 2022-11-30 DIAGNOSIS — I129 Hypertensive chronic kidney disease with stage 1 through stage 4 chronic kidney disease, or unspecified chronic kidney disease: Secondary | ICD-10-CM | POA: Diagnosis not present

## 2022-11-30 DIAGNOSIS — J4530 Mild persistent asthma, uncomplicated: Secondary | ICD-10-CM | POA: Diagnosis not present

## 2022-11-30 LAB — CUP PACEART REMOTE DEVICE CHECK
Battery Impedance: 592 Ohm
Battery Remaining Longevity: 103 mo
Battery Voltage: 2.78 V
Brady Statistic AP VP Percent: 0 %
Brady Statistic AP VS Percent: 4 %
Brady Statistic AS VP Percent: 0 %
Brady Statistic AS VS Percent: 96 %
Date Time Interrogation Session: 20240206080731
Implantable Lead Connection Status: 753985
Implantable Lead Connection Status: 753985
Implantable Lead Implant Date: 20160112
Implantable Lead Implant Date: 20160112
Implantable Lead Location: 753859
Implantable Lead Location: 753860
Implantable Lead Model: 5076
Implantable Lead Model: 5076
Implantable Pulse Generator Implant Date: 20160112
Lead Channel Impedance Value: 446 Ohm
Lead Channel Impedance Value: 487 Ohm
Lead Channel Pacing Threshold Amplitude: 0.75 V
Lead Channel Pacing Threshold Amplitude: 0.75 V
Lead Channel Pacing Threshold Pulse Width: 0.4 ms
Lead Channel Pacing Threshold Pulse Width: 0.4 ms
Lead Channel Setting Pacing Amplitude: 1.5 V
Lead Channel Setting Pacing Amplitude: 2 V
Lead Channel Setting Pacing Pulse Width: 0.4 ms
Lead Channel Setting Sensing Sensitivity: 2 mV
Zone Setting Status: 755011
Zone Setting Status: 755011

## 2022-12-01 DIAGNOSIS — M810 Age-related osteoporosis without current pathological fracture: Secondary | ICD-10-CM | POA: Diagnosis not present

## 2022-12-01 DIAGNOSIS — M1612 Unilateral primary osteoarthritis, left hip: Secondary | ICD-10-CM | POA: Diagnosis not present

## 2022-12-01 DIAGNOSIS — Z7982 Long term (current) use of aspirin: Secondary | ICD-10-CM | POA: Diagnosis not present

## 2022-12-01 DIAGNOSIS — J4530 Mild persistent asthma, uncomplicated: Secondary | ICD-10-CM | POA: Diagnosis not present

## 2022-12-01 DIAGNOSIS — I129 Hypertensive chronic kidney disease with stage 1 through stage 4 chronic kidney disease, or unspecified chronic kidney disease: Secondary | ICD-10-CM | POA: Diagnosis not present

## 2022-12-01 DIAGNOSIS — Z950 Presence of cardiac pacemaker: Secondary | ICD-10-CM | POA: Diagnosis not present

## 2022-12-01 DIAGNOSIS — Z951 Presence of aortocoronary bypass graft: Secondary | ICD-10-CM | POA: Diagnosis not present

## 2022-12-01 DIAGNOSIS — I447 Left bundle-branch block, unspecified: Secondary | ICD-10-CM | POA: Diagnosis not present

## 2022-12-01 DIAGNOSIS — M792 Neuralgia and neuritis, unspecified: Secondary | ICD-10-CM | POA: Diagnosis not present

## 2022-12-01 DIAGNOSIS — I441 Atrioventricular block, second degree: Secondary | ICD-10-CM | POA: Diagnosis not present

## 2022-12-01 DIAGNOSIS — I714 Abdominal aortic aneurysm, without rupture, unspecified: Secondary | ICD-10-CM | POA: Diagnosis not present

## 2022-12-01 DIAGNOSIS — Z79899 Other long term (current) drug therapy: Secondary | ICD-10-CM | POA: Diagnosis not present

## 2022-12-01 DIAGNOSIS — R350 Frequency of micturition: Secondary | ICD-10-CM | POA: Diagnosis not present

## 2022-12-01 DIAGNOSIS — G43909 Migraine, unspecified, not intractable, without status migrainosus: Secondary | ICD-10-CM | POA: Diagnosis not present

## 2022-12-01 DIAGNOSIS — Z96641 Presence of right artificial hip joint: Secondary | ICD-10-CM | POA: Diagnosis not present

## 2022-12-01 DIAGNOSIS — E039 Hypothyroidism, unspecified: Secondary | ICD-10-CM | POA: Diagnosis not present

## 2022-12-01 DIAGNOSIS — D649 Anemia, unspecified: Secondary | ICD-10-CM | POA: Diagnosis not present

## 2022-12-01 DIAGNOSIS — N183 Chronic kidney disease, stage 3 unspecified: Secondary | ICD-10-CM | POA: Diagnosis not present

## 2022-12-01 DIAGNOSIS — I2510 Atherosclerotic heart disease of native coronary artery without angina pectoris: Secondary | ICD-10-CM | POA: Diagnosis not present

## 2022-12-01 DIAGNOSIS — F0283 Dementia in other diseases classified elsewhere, unspecified severity, with mood disturbance: Secondary | ICD-10-CM | POA: Diagnosis not present

## 2022-12-01 DIAGNOSIS — G309 Alzheimer's disease, unspecified: Secondary | ICD-10-CM | POA: Diagnosis not present

## 2022-12-01 DIAGNOSIS — E785 Hyperlipidemia, unspecified: Secondary | ICD-10-CM | POA: Diagnosis not present

## 2022-12-01 DIAGNOSIS — Z952 Presence of prosthetic heart valve: Secondary | ICD-10-CM | POA: Diagnosis not present

## 2022-12-06 ENCOUNTER — Telehealth: Payer: Self-pay | Admitting: Primary Care

## 2022-12-06 DIAGNOSIS — J4530 Mild persistent asthma, uncomplicated: Secondary | ICD-10-CM | POA: Diagnosis not present

## 2022-12-06 DIAGNOSIS — Z96641 Presence of right artificial hip joint: Secondary | ICD-10-CM | POA: Diagnosis not present

## 2022-12-06 DIAGNOSIS — M810 Age-related osteoporosis without current pathological fracture: Secondary | ICD-10-CM | POA: Diagnosis not present

## 2022-12-06 DIAGNOSIS — I129 Hypertensive chronic kidney disease with stage 1 through stage 4 chronic kidney disease, or unspecified chronic kidney disease: Secondary | ICD-10-CM | POA: Diagnosis not present

## 2022-12-06 DIAGNOSIS — D649 Anemia, unspecified: Secondary | ICD-10-CM | POA: Diagnosis not present

## 2022-12-06 DIAGNOSIS — Z952 Presence of prosthetic heart valve: Secondary | ICD-10-CM | POA: Diagnosis not present

## 2022-12-06 DIAGNOSIS — Z950 Presence of cardiac pacemaker: Secondary | ICD-10-CM | POA: Diagnosis not present

## 2022-12-06 DIAGNOSIS — I441 Atrioventricular block, second degree: Secondary | ICD-10-CM | POA: Diagnosis not present

## 2022-12-06 DIAGNOSIS — N183 Chronic kidney disease, stage 3 unspecified: Secondary | ICD-10-CM | POA: Diagnosis not present

## 2022-12-06 DIAGNOSIS — E039 Hypothyroidism, unspecified: Secondary | ICD-10-CM | POA: Diagnosis not present

## 2022-12-06 DIAGNOSIS — Z7982 Long term (current) use of aspirin: Secondary | ICD-10-CM | POA: Diagnosis not present

## 2022-12-06 DIAGNOSIS — E785 Hyperlipidemia, unspecified: Secondary | ICD-10-CM | POA: Diagnosis not present

## 2022-12-06 DIAGNOSIS — I447 Left bundle-branch block, unspecified: Secondary | ICD-10-CM | POA: Diagnosis not present

## 2022-12-06 DIAGNOSIS — I714 Abdominal aortic aneurysm, without rupture, unspecified: Secondary | ICD-10-CM | POA: Diagnosis not present

## 2022-12-06 DIAGNOSIS — Z79899 Other long term (current) drug therapy: Secondary | ICD-10-CM | POA: Diagnosis not present

## 2022-12-06 DIAGNOSIS — I2510 Atherosclerotic heart disease of native coronary artery without angina pectoris: Secondary | ICD-10-CM | POA: Diagnosis not present

## 2022-12-06 DIAGNOSIS — F0283 Dementia in other diseases classified elsewhere, unspecified severity, with mood disturbance: Secondary | ICD-10-CM | POA: Diagnosis not present

## 2022-12-06 DIAGNOSIS — M792 Neuralgia and neuritis, unspecified: Secondary | ICD-10-CM | POA: Diagnosis not present

## 2022-12-06 DIAGNOSIS — Z951 Presence of aortocoronary bypass graft: Secondary | ICD-10-CM | POA: Diagnosis not present

## 2022-12-06 DIAGNOSIS — G309 Alzheimer's disease, unspecified: Secondary | ICD-10-CM | POA: Diagnosis not present

## 2022-12-06 DIAGNOSIS — R350 Frequency of micturition: Secondary | ICD-10-CM | POA: Diagnosis not present

## 2022-12-06 DIAGNOSIS — M1612 Unilateral primary osteoarthritis, left hip: Secondary | ICD-10-CM | POA: Diagnosis not present

## 2022-12-06 DIAGNOSIS — G43909 Migraine, unspecified, not intractable, without status migrainosus: Secondary | ICD-10-CM | POA: Diagnosis not present

## 2022-12-06 NOTE — Telephone Encounter (Signed)
Patient daughter [name] called in and had some concerns about her mom [name] not eating. She stated that she is drinking tea, water and ginger ale down, and last night she did eat a little portion of dinner. She can be reached at [phone]. Thank you!

## 2022-12-06 NOTE — Telephone Encounter (Signed)
Please call patient's daughter:  Refusing to eat is a common behavior of progressing dementia.  It would be prudent to check a urinalysis to ensure she hasn't developed another UTI.  Can the facility check this or do we need to have her pick up a urine kit?

## 2022-12-06 NOTE — Telephone Encounter (Signed)
Called patients daughter [name], states the patient has refused to eat all week (Mon-Sat). Saturday they tried to give her an Ensure and she threw that up.  Last night she was able to eat some ham and cheese rollups and ate some red potatoes. She states the patient is saying she is not hungry and that is why she is not eating. Patients daughter would like some guidance on how to proceed, is this normal with her dementia and her body is just shutting down or is there something that she should be doing to help her? She states she is okay if its her body's way of naturally shutting down and dying but if she should be intervening then she is okay with that too. Please advise

## 2022-12-07 DIAGNOSIS — E785 Hyperlipidemia, unspecified: Secondary | ICD-10-CM | POA: Diagnosis not present

## 2022-12-07 DIAGNOSIS — F0283 Dementia in other diseases classified elsewhere, unspecified severity, with mood disturbance: Secondary | ICD-10-CM | POA: Diagnosis not present

## 2022-12-07 DIAGNOSIS — I129 Hypertensive chronic kidney disease with stage 1 through stage 4 chronic kidney disease, or unspecified chronic kidney disease: Secondary | ICD-10-CM | POA: Diagnosis not present

## 2022-12-07 DIAGNOSIS — D649 Anemia, unspecified: Secondary | ICD-10-CM | POA: Diagnosis not present

## 2022-12-07 DIAGNOSIS — M810 Age-related osteoporosis without current pathological fracture: Secondary | ICD-10-CM | POA: Diagnosis not present

## 2022-12-07 DIAGNOSIS — Z950 Presence of cardiac pacemaker: Secondary | ICD-10-CM | POA: Diagnosis not present

## 2022-12-07 DIAGNOSIS — Z951 Presence of aortocoronary bypass graft: Secondary | ICD-10-CM | POA: Diagnosis not present

## 2022-12-07 DIAGNOSIS — N183 Chronic kidney disease, stage 3 unspecified: Secondary | ICD-10-CM | POA: Diagnosis not present

## 2022-12-07 DIAGNOSIS — G309 Alzheimer's disease, unspecified: Secondary | ICD-10-CM | POA: Diagnosis not present

## 2022-12-07 DIAGNOSIS — M792 Neuralgia and neuritis, unspecified: Secondary | ICD-10-CM | POA: Diagnosis not present

## 2022-12-07 DIAGNOSIS — Z96641 Presence of right artificial hip joint: Secondary | ICD-10-CM | POA: Diagnosis not present

## 2022-12-07 DIAGNOSIS — M1612 Unilateral primary osteoarthritis, left hip: Secondary | ICD-10-CM | POA: Diagnosis not present

## 2022-12-07 DIAGNOSIS — G43909 Migraine, unspecified, not intractable, without status migrainosus: Secondary | ICD-10-CM | POA: Diagnosis not present

## 2022-12-07 DIAGNOSIS — E039 Hypothyroidism, unspecified: Secondary | ICD-10-CM | POA: Diagnosis not present

## 2022-12-07 DIAGNOSIS — R350 Frequency of micturition: Secondary | ICD-10-CM | POA: Diagnosis not present

## 2022-12-07 DIAGNOSIS — J4530 Mild persistent asthma, uncomplicated: Secondary | ICD-10-CM | POA: Diagnosis not present

## 2022-12-07 DIAGNOSIS — I714 Abdominal aortic aneurysm, without rupture, unspecified: Secondary | ICD-10-CM | POA: Diagnosis not present

## 2022-12-07 DIAGNOSIS — Z7982 Long term (current) use of aspirin: Secondary | ICD-10-CM | POA: Diagnosis not present

## 2022-12-07 DIAGNOSIS — I447 Left bundle-branch block, unspecified: Secondary | ICD-10-CM | POA: Diagnosis not present

## 2022-12-07 DIAGNOSIS — Z79899 Other long term (current) drug therapy: Secondary | ICD-10-CM | POA: Diagnosis not present

## 2022-12-07 DIAGNOSIS — Z952 Presence of prosthetic heart valve: Secondary | ICD-10-CM | POA: Diagnosis not present

## 2022-12-07 DIAGNOSIS — I2510 Atherosclerotic heart disease of native coronary artery without angina pectoris: Secondary | ICD-10-CM | POA: Diagnosis not present

## 2022-12-07 DIAGNOSIS — I441 Atrioventricular block, second degree: Secondary | ICD-10-CM | POA: Diagnosis not present

## 2022-12-07 NOTE — Telephone Encounter (Signed)
Patients grandson will come by and pick up the urine kit for patient. His name is Damaris Hippo and will be here soon to pick it up.

## 2022-12-07 NOTE — Telephone Encounter (Signed)
Called and advised patients daughter, she will come pick up a urine kit from the office and bring back.

## 2022-12-07 NOTE — Telephone Encounter (Signed)
Take out "another" UTI. Need to rule out UTI.

## 2022-12-07 NOTE — Telephone Encounter (Signed)
Called patients daughter and advised of your message. She would like some clarification as she is not aware that the patient had a UTI in the past.

## 2022-12-07 NOTE — Telephone Encounter (Signed)
Noted. He can pick it up from lab

## 2022-12-08 ENCOUNTER — Other Ambulatory Visit (INDEPENDENT_AMBULATORY_CARE_PROVIDER_SITE_OTHER): Payer: Medicare Other

## 2022-12-08 ENCOUNTER — Telehealth: Payer: Self-pay | Admitting: Primary Care

## 2022-12-08 ENCOUNTER — Other Ambulatory Visit: Payer: Self-pay | Admitting: Primary Care

## 2022-12-08 DIAGNOSIS — R410 Disorientation, unspecified: Secondary | ICD-10-CM

## 2022-12-08 LAB — POC URINALSYSI DIPSTICK (AUTOMATED)
Bilirubin, UA: NEGATIVE
Blood, UA: NEGATIVE
Glucose, UA: NEGATIVE
Ketones, UA: NEGATIVE
Leukocytes, UA: NEGATIVE
Nitrite, UA: NEGATIVE
Protein, UA: NEGATIVE
Spec Grav, UA: <=1.005 — AB (ref 1.010–1.025)
Urobilinogen, UA: 0.2 E.U./dL
pH, UA: 6 (ref 5.0–8.0)

## 2022-12-08 NOTE — Telephone Encounter (Signed)
Home Health verbal orders Caller Name:Connie Agency Name: [facility]  [place] number: [phone]  Requesting OT/PT/Skilled nursing/Social Work/Speech: OT  Reason: femur fracture with surgery  Frequency:1 wk 8  Please forward to [facility] pool or providers CMA

## 2022-12-08 NOTE — Telephone Encounter (Signed)
Approved.  

## 2022-12-08 NOTE — Telephone Encounter (Signed)
Notified Marlowe Kays of approval of verbal orders.

## 2022-12-09 LAB — URINE CULTURE
MICRO NUMBER:: 14564548
SPECIMEN QUALITY:: ADEQUATE

## 2022-12-10 DIAGNOSIS — I129 Hypertensive chronic kidney disease with stage 1 through stage 4 chronic kidney disease, or unspecified chronic kidney disease: Secondary | ICD-10-CM | POA: Diagnosis not present

## 2022-12-10 DIAGNOSIS — M1612 Unilateral primary osteoarthritis, left hip: Secondary | ICD-10-CM | POA: Diagnosis not present

## 2022-12-10 DIAGNOSIS — I447 Left bundle-branch block, unspecified: Secondary | ICD-10-CM | POA: Diagnosis not present

## 2022-12-10 DIAGNOSIS — I2510 Atherosclerotic heart disease of native coronary artery without angina pectoris: Secondary | ICD-10-CM | POA: Diagnosis not present

## 2022-12-10 DIAGNOSIS — I714 Abdominal aortic aneurysm, without rupture, unspecified: Secondary | ICD-10-CM | POA: Diagnosis not present

## 2022-12-10 DIAGNOSIS — G43909 Migraine, unspecified, not intractable, without status migrainosus: Secondary | ICD-10-CM | POA: Diagnosis not present

## 2022-12-10 DIAGNOSIS — F0283 Dementia in other diseases classified elsewhere, unspecified severity, with mood disturbance: Secondary | ICD-10-CM | POA: Diagnosis not present

## 2022-12-10 DIAGNOSIS — J4530 Mild persistent asthma, uncomplicated: Secondary | ICD-10-CM | POA: Diagnosis not present

## 2022-12-10 DIAGNOSIS — Z79899 Other long term (current) drug therapy: Secondary | ICD-10-CM | POA: Diagnosis not present

## 2022-12-10 DIAGNOSIS — E039 Hypothyroidism, unspecified: Secondary | ICD-10-CM | POA: Diagnosis not present

## 2022-12-10 DIAGNOSIS — D649 Anemia, unspecified: Secondary | ICD-10-CM | POA: Diagnosis not present

## 2022-12-10 DIAGNOSIS — R350 Frequency of micturition: Secondary | ICD-10-CM | POA: Diagnosis not present

## 2022-12-10 DIAGNOSIS — N183 Chronic kidney disease, stage 3 unspecified: Secondary | ICD-10-CM | POA: Diagnosis not present

## 2022-12-10 DIAGNOSIS — I441 Atrioventricular block, second degree: Secondary | ICD-10-CM | POA: Diagnosis not present

## 2022-12-10 DIAGNOSIS — M792 Neuralgia and neuritis, unspecified: Secondary | ICD-10-CM | POA: Diagnosis not present

## 2022-12-10 DIAGNOSIS — G309 Alzheimer's disease, unspecified: Secondary | ICD-10-CM | POA: Diagnosis not present

## 2022-12-10 DIAGNOSIS — Z96641 Presence of right artificial hip joint: Secondary | ICD-10-CM | POA: Diagnosis not present

## 2022-12-10 DIAGNOSIS — M810 Age-related osteoporosis without current pathological fracture: Secondary | ICD-10-CM | POA: Diagnosis not present

## 2022-12-10 DIAGNOSIS — Z952 Presence of prosthetic heart valve: Secondary | ICD-10-CM | POA: Diagnosis not present

## 2022-12-10 DIAGNOSIS — E785 Hyperlipidemia, unspecified: Secondary | ICD-10-CM | POA: Diagnosis not present

## 2022-12-10 DIAGNOSIS — Z950 Presence of cardiac pacemaker: Secondary | ICD-10-CM | POA: Diagnosis not present

## 2022-12-10 DIAGNOSIS — Z7982 Long term (current) use of aspirin: Secondary | ICD-10-CM | POA: Diagnosis not present

## 2022-12-10 DIAGNOSIS — Z951 Presence of aortocoronary bypass graft: Secondary | ICD-10-CM | POA: Diagnosis not present

## 2022-12-13 DIAGNOSIS — E039 Hypothyroidism, unspecified: Secondary | ICD-10-CM | POA: Diagnosis not present

## 2022-12-13 DIAGNOSIS — M810 Age-related osteoporosis without current pathological fracture: Secondary | ICD-10-CM | POA: Diagnosis not present

## 2022-12-13 DIAGNOSIS — I447 Left bundle-branch block, unspecified: Secondary | ICD-10-CM | POA: Diagnosis not present

## 2022-12-13 DIAGNOSIS — I129 Hypertensive chronic kidney disease with stage 1 through stage 4 chronic kidney disease, or unspecified chronic kidney disease: Secondary | ICD-10-CM | POA: Diagnosis not present

## 2022-12-13 DIAGNOSIS — Z952 Presence of prosthetic heart valve: Secondary | ICD-10-CM | POA: Diagnosis not present

## 2022-12-13 DIAGNOSIS — G43909 Migraine, unspecified, not intractable, without status migrainosus: Secondary | ICD-10-CM | POA: Diagnosis not present

## 2022-12-13 DIAGNOSIS — I714 Abdominal aortic aneurysm, without rupture, unspecified: Secondary | ICD-10-CM | POA: Diagnosis not present

## 2022-12-13 DIAGNOSIS — Z96641 Presence of right artificial hip joint: Secondary | ICD-10-CM | POA: Diagnosis not present

## 2022-12-13 DIAGNOSIS — M1612 Unilateral primary osteoarthritis, left hip: Secondary | ICD-10-CM | POA: Diagnosis not present

## 2022-12-13 DIAGNOSIS — Z79899 Other long term (current) drug therapy: Secondary | ICD-10-CM | POA: Diagnosis not present

## 2022-12-13 DIAGNOSIS — Z950 Presence of cardiac pacemaker: Secondary | ICD-10-CM | POA: Diagnosis not present

## 2022-12-13 DIAGNOSIS — I441 Atrioventricular block, second degree: Secondary | ICD-10-CM | POA: Diagnosis not present

## 2022-12-13 DIAGNOSIS — J4530 Mild persistent asthma, uncomplicated: Secondary | ICD-10-CM | POA: Diagnosis not present

## 2022-12-13 DIAGNOSIS — E785 Hyperlipidemia, unspecified: Secondary | ICD-10-CM | POA: Diagnosis not present

## 2022-12-13 DIAGNOSIS — F0283 Dementia in other diseases classified elsewhere, unspecified severity, with mood disturbance: Secondary | ICD-10-CM | POA: Diagnosis not present

## 2022-12-13 DIAGNOSIS — Z7982 Long term (current) use of aspirin: Secondary | ICD-10-CM | POA: Diagnosis not present

## 2022-12-13 DIAGNOSIS — R350 Frequency of micturition: Secondary | ICD-10-CM | POA: Diagnosis not present

## 2022-12-13 DIAGNOSIS — N183 Chronic kidney disease, stage 3 unspecified: Secondary | ICD-10-CM | POA: Diagnosis not present

## 2022-12-13 DIAGNOSIS — Z951 Presence of aortocoronary bypass graft: Secondary | ICD-10-CM | POA: Diagnosis not present

## 2022-12-13 DIAGNOSIS — G309 Alzheimer's disease, unspecified: Secondary | ICD-10-CM | POA: Diagnosis not present

## 2022-12-13 DIAGNOSIS — I2510 Atherosclerotic heart disease of native coronary artery without angina pectoris: Secondary | ICD-10-CM | POA: Diagnosis not present

## 2022-12-13 DIAGNOSIS — M792 Neuralgia and neuritis, unspecified: Secondary | ICD-10-CM | POA: Diagnosis not present

## 2022-12-13 DIAGNOSIS — D649 Anemia, unspecified: Secondary | ICD-10-CM | POA: Diagnosis not present

## 2022-12-15 DIAGNOSIS — Z952 Presence of prosthetic heart valve: Secondary | ICD-10-CM | POA: Diagnosis not present

## 2022-12-15 DIAGNOSIS — N183 Chronic kidney disease, stage 3 unspecified: Secondary | ICD-10-CM | POA: Diagnosis not present

## 2022-12-15 DIAGNOSIS — I714 Abdominal aortic aneurysm, without rupture, unspecified: Secondary | ICD-10-CM | POA: Diagnosis not present

## 2022-12-15 DIAGNOSIS — E785 Hyperlipidemia, unspecified: Secondary | ICD-10-CM | POA: Diagnosis not present

## 2022-12-15 DIAGNOSIS — Z950 Presence of cardiac pacemaker: Secondary | ICD-10-CM | POA: Diagnosis not present

## 2022-12-15 DIAGNOSIS — I129 Hypertensive chronic kidney disease with stage 1 through stage 4 chronic kidney disease, or unspecified chronic kidney disease: Secondary | ICD-10-CM | POA: Diagnosis not present

## 2022-12-15 DIAGNOSIS — Z79899 Other long term (current) drug therapy: Secondary | ICD-10-CM | POA: Diagnosis not present

## 2022-12-15 DIAGNOSIS — I441 Atrioventricular block, second degree: Secondary | ICD-10-CM | POA: Diagnosis not present

## 2022-12-15 DIAGNOSIS — Z96641 Presence of right artificial hip joint: Secondary | ICD-10-CM | POA: Diagnosis not present

## 2022-12-15 DIAGNOSIS — I447 Left bundle-branch block, unspecified: Secondary | ICD-10-CM | POA: Diagnosis not present

## 2022-12-15 DIAGNOSIS — G309 Alzheimer's disease, unspecified: Secondary | ICD-10-CM | POA: Diagnosis not present

## 2022-12-15 DIAGNOSIS — R350 Frequency of micturition: Secondary | ICD-10-CM | POA: Diagnosis not present

## 2022-12-15 DIAGNOSIS — M810 Age-related osteoporosis without current pathological fracture: Secondary | ICD-10-CM | POA: Diagnosis not present

## 2022-12-15 DIAGNOSIS — G43909 Migraine, unspecified, not intractable, without status migrainosus: Secondary | ICD-10-CM | POA: Diagnosis not present

## 2022-12-15 DIAGNOSIS — E039 Hypothyroidism, unspecified: Secondary | ICD-10-CM | POA: Diagnosis not present

## 2022-12-15 DIAGNOSIS — F0283 Dementia in other diseases classified elsewhere, unspecified severity, with mood disturbance: Secondary | ICD-10-CM | POA: Diagnosis not present

## 2022-12-15 DIAGNOSIS — M792 Neuralgia and neuritis, unspecified: Secondary | ICD-10-CM | POA: Diagnosis not present

## 2022-12-15 DIAGNOSIS — D649 Anemia, unspecified: Secondary | ICD-10-CM | POA: Diagnosis not present

## 2022-12-15 DIAGNOSIS — J4530 Mild persistent asthma, uncomplicated: Secondary | ICD-10-CM | POA: Diagnosis not present

## 2022-12-15 DIAGNOSIS — Z7982 Long term (current) use of aspirin: Secondary | ICD-10-CM | POA: Diagnosis not present

## 2022-12-15 DIAGNOSIS — I2510 Atherosclerotic heart disease of native coronary artery without angina pectoris: Secondary | ICD-10-CM | POA: Diagnosis not present

## 2022-12-15 DIAGNOSIS — M1612 Unilateral primary osteoarthritis, left hip: Secondary | ICD-10-CM | POA: Diagnosis not present

## 2022-12-15 DIAGNOSIS — Z951 Presence of aortocoronary bypass graft: Secondary | ICD-10-CM | POA: Diagnosis not present

## 2022-12-16 ENCOUNTER — Telehealth: Payer: Self-pay | Admitting: Primary Care

## 2022-12-16 DIAGNOSIS — M7061 Trochanteric bursitis, right hip: Secondary | ICD-10-CM | POA: Diagnosis not present

## 2022-12-16 DIAGNOSIS — Z5189 Encounter for other specified aftercare: Secondary | ICD-10-CM | POA: Diagnosis not present

## 2022-12-16 DIAGNOSIS — Z96641 Presence of right artificial hip joint: Secondary | ICD-10-CM | POA: Diagnosis not present

## 2022-12-16 NOTE — Telephone Encounter (Signed)
Type of forms received: FMLA  Routed to:[employer] received by :  Gwynn Burly   Individual made aware of 3-5 business day turn around (Y/N): Y  Form completed and patient made aware of charges(Y/N): Y   Faxed to :   Form location: Place in [employer] folder

## 2022-12-16 NOTE — Telephone Encounter (Signed)
Patient's daughter, Myna Hidalgo, dropped off FMLA form for her employer.  This is to be completed the same as last year's form.  No due date listed.  Form placed in your inbox for signing.  When form is complete, mail patient's copy out.

## 2022-12-16 NOTE — Telephone Encounter (Signed)
Forms signed and placed on Kate's desk.

## 2022-12-17 NOTE — Telephone Encounter (Signed)
Completed form has been emailed to:  [email or web address].  Received response back that it was received.  Copies made for scanning, patient, and myself.  Patient's copy placed in outgoing mail.  Patient notified via mychart.

## 2022-12-22 DIAGNOSIS — R350 Frequency of micturition: Secondary | ICD-10-CM | POA: Diagnosis not present

## 2022-12-22 DIAGNOSIS — I714 Abdominal aortic aneurysm, without rupture, unspecified: Secondary | ICD-10-CM | POA: Diagnosis not present

## 2022-12-22 DIAGNOSIS — E039 Hypothyroidism, unspecified: Secondary | ICD-10-CM | POA: Diagnosis not present

## 2022-12-22 DIAGNOSIS — G309 Alzheimer's disease, unspecified: Secondary | ICD-10-CM | POA: Diagnosis not present

## 2022-12-22 DIAGNOSIS — M810 Age-related osteoporosis without current pathological fracture: Secondary | ICD-10-CM | POA: Diagnosis not present

## 2022-12-22 DIAGNOSIS — G43909 Migraine, unspecified, not intractable, without status migrainosus: Secondary | ICD-10-CM | POA: Diagnosis not present

## 2022-12-22 DIAGNOSIS — D649 Anemia, unspecified: Secondary | ICD-10-CM | POA: Diagnosis not present

## 2022-12-22 DIAGNOSIS — Z951 Presence of aortocoronary bypass graft: Secondary | ICD-10-CM | POA: Diagnosis not present

## 2022-12-22 DIAGNOSIS — N183 Chronic kidney disease, stage 3 unspecified: Secondary | ICD-10-CM | POA: Diagnosis not present

## 2022-12-22 DIAGNOSIS — E785 Hyperlipidemia, unspecified: Secondary | ICD-10-CM | POA: Diagnosis not present

## 2022-12-22 DIAGNOSIS — I447 Left bundle-branch block, unspecified: Secondary | ICD-10-CM | POA: Diagnosis not present

## 2022-12-22 DIAGNOSIS — Z7982 Long term (current) use of aspirin: Secondary | ICD-10-CM | POA: Diagnosis not present

## 2022-12-22 DIAGNOSIS — M1612 Unilateral primary osteoarthritis, left hip: Secondary | ICD-10-CM | POA: Diagnosis not present

## 2022-12-22 DIAGNOSIS — J4530 Mild persistent asthma, uncomplicated: Secondary | ICD-10-CM | POA: Diagnosis not present

## 2022-12-22 DIAGNOSIS — Z952 Presence of prosthetic heart valve: Secondary | ICD-10-CM | POA: Diagnosis not present

## 2022-12-22 DIAGNOSIS — Z950 Presence of cardiac pacemaker: Secondary | ICD-10-CM | POA: Diagnosis not present

## 2022-12-22 DIAGNOSIS — F0283 Dementia in other diseases classified elsewhere, unspecified severity, with mood disturbance: Secondary | ICD-10-CM | POA: Diagnosis not present

## 2022-12-22 DIAGNOSIS — I441 Atrioventricular block, second degree: Secondary | ICD-10-CM | POA: Diagnosis not present

## 2022-12-22 DIAGNOSIS — Z79899 Other long term (current) drug therapy: Secondary | ICD-10-CM | POA: Diagnosis not present

## 2022-12-22 DIAGNOSIS — Z96641 Presence of right artificial hip joint: Secondary | ICD-10-CM | POA: Diagnosis not present

## 2022-12-22 DIAGNOSIS — I129 Hypertensive chronic kidney disease with stage 1 through stage 4 chronic kidney disease, or unspecified chronic kidney disease: Secondary | ICD-10-CM | POA: Diagnosis not present

## 2022-12-22 DIAGNOSIS — I2510 Atherosclerotic heart disease of native coronary artery without angina pectoris: Secondary | ICD-10-CM | POA: Diagnosis not present

## 2022-12-22 DIAGNOSIS — M792 Neuralgia and neuritis, unspecified: Secondary | ICD-10-CM | POA: Diagnosis not present

## 2022-12-23 DIAGNOSIS — I714 Abdominal aortic aneurysm, without rupture, unspecified: Secondary | ICD-10-CM | POA: Diagnosis not present

## 2022-12-23 DIAGNOSIS — J4530 Mild persistent asthma, uncomplicated: Secondary | ICD-10-CM | POA: Diagnosis not present

## 2022-12-23 DIAGNOSIS — G43909 Migraine, unspecified, not intractable, without status migrainosus: Secondary | ICD-10-CM | POA: Diagnosis not present

## 2022-12-23 DIAGNOSIS — Z950 Presence of cardiac pacemaker: Secondary | ICD-10-CM | POA: Diagnosis not present

## 2022-12-23 DIAGNOSIS — Z79899 Other long term (current) drug therapy: Secondary | ICD-10-CM | POA: Diagnosis not present

## 2022-12-23 DIAGNOSIS — Z952 Presence of prosthetic heart valve: Secondary | ICD-10-CM | POA: Diagnosis not present

## 2022-12-23 DIAGNOSIS — Z7982 Long term (current) use of aspirin: Secondary | ICD-10-CM | POA: Diagnosis not present

## 2022-12-23 DIAGNOSIS — M810 Age-related osteoporosis without current pathological fracture: Secondary | ICD-10-CM | POA: Diagnosis not present

## 2022-12-23 DIAGNOSIS — G309 Alzheimer's disease, unspecified: Secondary | ICD-10-CM | POA: Diagnosis not present

## 2022-12-23 DIAGNOSIS — F0283 Dementia in other diseases classified elsewhere, unspecified severity, with mood disturbance: Secondary | ICD-10-CM | POA: Diagnosis not present

## 2022-12-23 DIAGNOSIS — E039 Hypothyroidism, unspecified: Secondary | ICD-10-CM | POA: Diagnosis not present

## 2022-12-23 DIAGNOSIS — R350 Frequency of micturition: Secondary | ICD-10-CM | POA: Diagnosis not present

## 2022-12-23 DIAGNOSIS — I447 Left bundle-branch block, unspecified: Secondary | ICD-10-CM | POA: Diagnosis not present

## 2022-12-23 DIAGNOSIS — N183 Chronic kidney disease, stage 3 unspecified: Secondary | ICD-10-CM | POA: Diagnosis not present

## 2022-12-23 DIAGNOSIS — D649 Anemia, unspecified: Secondary | ICD-10-CM | POA: Diagnosis not present

## 2022-12-23 DIAGNOSIS — M792 Neuralgia and neuritis, unspecified: Secondary | ICD-10-CM | POA: Diagnosis not present

## 2022-12-23 DIAGNOSIS — I2510 Atherosclerotic heart disease of native coronary artery without angina pectoris: Secondary | ICD-10-CM | POA: Diagnosis not present

## 2022-12-23 DIAGNOSIS — E785 Hyperlipidemia, unspecified: Secondary | ICD-10-CM | POA: Diagnosis not present

## 2022-12-23 DIAGNOSIS — Z951 Presence of aortocoronary bypass graft: Secondary | ICD-10-CM | POA: Diagnosis not present

## 2022-12-23 DIAGNOSIS — I441 Atrioventricular block, second degree: Secondary | ICD-10-CM | POA: Diagnosis not present

## 2022-12-23 DIAGNOSIS — M1612 Unilateral primary osteoarthritis, left hip: Secondary | ICD-10-CM | POA: Diagnosis not present

## 2022-12-23 DIAGNOSIS — Z96641 Presence of right artificial hip joint: Secondary | ICD-10-CM | POA: Diagnosis not present

## 2022-12-23 DIAGNOSIS — I129 Hypertensive chronic kidney disease with stage 1 through stage 4 chronic kidney disease, or unspecified chronic kidney disease: Secondary | ICD-10-CM | POA: Diagnosis not present

## 2022-12-27 ENCOUNTER — Encounter: Payer: Self-pay | Admitting: Cardiovascular Disease

## 2022-12-27 ENCOUNTER — Ambulatory Visit: Payer: Medicare Other | Attending: Cardiovascular Disease | Admitting: Cardiovascular Disease

## 2022-12-27 VITALS — BP 110/64 | HR 80 | Ht 69.0 in | Wt 148.2 lb

## 2022-12-27 DIAGNOSIS — I5032 Chronic diastolic (congestive) heart failure: Secondary | ICD-10-CM | POA: Diagnosis not present

## 2022-12-27 DIAGNOSIS — I739 Peripheral vascular disease, unspecified: Secondary | ICD-10-CM | POA: Diagnosis not present

## 2022-12-27 DIAGNOSIS — F0280 Dementia in other diseases classified elsewhere without behavioral disturbance: Secondary | ICD-10-CM

## 2022-12-27 DIAGNOSIS — Z950 Presence of cardiac pacemaker: Secondary | ICD-10-CM

## 2022-12-27 DIAGNOSIS — I341 Nonrheumatic mitral (valve) prolapse: Secondary | ICD-10-CM

## 2022-12-27 DIAGNOSIS — I10 Essential (primary) hypertension: Secondary | ICD-10-CM | POA: Diagnosis not present

## 2022-12-27 DIAGNOSIS — E7800 Pure hypercholesterolemia, unspecified: Secondary | ICD-10-CM | POA: Diagnosis not present

## 2022-12-27 DIAGNOSIS — G301 Alzheimer's disease with late onset: Secondary | ICD-10-CM

## 2022-12-27 DIAGNOSIS — I7143 Infrarenal abdominal aortic aneurysm, without rupture: Secondary | ICD-10-CM

## 2022-12-27 DIAGNOSIS — I2510 Atherosclerotic heart disease of native coronary artery without angina pectoris: Secondary | ICD-10-CM | POA: Diagnosis not present

## 2022-12-27 DIAGNOSIS — Z953 Presence of xenogenic heart valve: Secondary | ICD-10-CM

## 2022-12-27 MED ORDER — LOSARTAN POTASSIUM 50 MG PO TABS: 50.0000 mg | ORAL_TABLET | Freq: Every day | ORAL | 3 refills | Status: DC

## 2022-12-27 MED ORDER — POTASSIUM CHLORIDE CRYS ER 10 MEQ PO TBCR: EXTENDED_RELEASE_TABLET | ORAL | 3 refills | Status: AC

## 2022-12-27 MED ORDER — FUROSEMIDE 20 MG PO TABS: 20.0000 mg | ORAL_TABLET | Freq: Every day | ORAL | 3 refills | Status: DC

## 2022-12-27 NOTE — Progress Notes (Signed)
Cardiology office note     Date:  12/27/2022   ID:  [name], DOB [birth year], MRN [id number]  PCP:  Pleas Koch, NP  Cardiologist:  Sanda Klein, MD  Electrophysiologist:  None   Evaluation Performed:  Follow-Up Visit diastolic heart failure, s/p AVR and pacemaker  Chief Complaint: CHF  History of Present Illness:     As with previous visits the patient's daughter, [name], participated in the visit, due to the patient's short-term memory issues.  [name] is a 89 y.o. female with chronic heart failure with preserved left ventricular ejection fraction, history of aortic valve replacement with a biological prosthesis, mild mitral valve prolapse with mild mitral insufficiency, remote history of syncope due to Mobitz type II second-degree AV block with implantation of a dual-chamber permanent pacemaker (Medtronic, 2016), small AAA.  She has had a difficult few months over the winter.  She has had waxing and waning problems with her memory and dementia.  She still usually recognizes her daughter, but no longer recognizes or remembers the names of her grandchildren.  She had an episode that sounds like transient aphasia in December.  Workup with imaging studies did not show acute stroke, but there was evidence of significant cerebral atrophy.  She was eventually diagnosed with a COVID infection and her neurological problems resolved after a few days.  On January 4 she had a fall complicated by left hip fracture.  She initially was taken to [facility] and from there transferred to [facility] where she had hip replacement surgery on January 7.  She spent some time in rehab at [facility] but is now back at [facility] [place].  She continues to have good days and bad days and is occasionally disoriented.  She is getting around with a walker.  She is lost weight.  Her appetite is not great.  She has not had any syncope or other falls.   Denies shortness of breath or chest pain and does not have any lower extremity edema.  Presenting rhythm today is atrial sensed (sinus), ventricular sensed.  She only has 11.5% atrial pacing and 0.4% ventricular pacing.  The overall burden of atrial mode switches very low less than 0.1% with the longest episode lasting about 7 minutes and more suggestive of paroxysmal atrial tachycardia than true atrial fibrillation.  Most of the episodes of mode switch last for just a few seconds.  Generator longevity is estimated at 8.5 years and all lead parameters are good.  After discussion with her primary care provider and her family she has decided to have a DNR status.  She reaffirmed this today.  The aortic valve is a 21 mm Edwards MagnaEase bovine bioprosthesis placed in 2009. Her echocardiogram was last performed in July 2020 and shows normal left ventricular systolic function and normal gradients across the aortic valve biological prosthesis (Mean gradient 12 mmHg, dimensionless index 0.47).  Her dual-chamber Medtronic Adapta permanent pacemaker was implanted in 2016 for sinus pauses and second-degree AV block.  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     Past Medical History:  Diagnosis Date   AAA (abdominal aortic aneurysm) ([place]) 01/03/13   3.5x3.3cm   Alzheimer's dementia ([place])    "probably middle stage" (11/05/2014)   Anemia    "hx of chronic" (11/05/2014)   Aortic stenosis 03/29/2008   biological prosthetic replacement   Arthritis    "joints" (11/05/2014)   Asthma  Avascular necrosis of bone of hip, right ([place]) 07/28/2018   Chronic asthmatic bronchitis    "q fall and winter" (11/05/2014)   Closed nondisplaced fracture of middle phalanx of lesser toe of right foot 01/05/2021   Coronary artery disease    CABG 03/29/08   Dyslipidemia    Exertional shortness of breath    Family history of adverse reaction to anesthesia    "daughter gets PONV too"   Heart murmur     Hypertension    Hypothyroidism    LBBB (left bundle branch block)    Migraines    "none in years' (11/05/2014)   Mobitz type 2 second degree AV block 10/31/2014   MVP (mitral valve prolapse)    with mild mitral insufficiency/notes 08/17/2013   Near syncope 08/17/2013   PONV (postoperative nausea and vomiting)    Presence of permanent cardiac pacemaker    Sinus pause 10/31/2014   Stroke ([place]) 03/2008   "2 light strokes"; denies residual on 11/05/2014   Urinary frequency    Past Surgical History:  Procedure Laterality Date   ABDOMINAL HYSTERECTOMY  1977   AORTIC VALVE REPLACEMENT  03/29/2008   PERICARDIAL TISSUE VALVE   APPENDECTOMY  ?1977   CARDIAC CATHETERIZATION  ~ 2009   CATARACT EXTRACTION W/ INTRAOCULAR LENS IMPLANT Left    CORONARY ARTERY BYPASS GRAFT  03/29/2008   LIMA TO LAD,SVG TO CX,SVG TO LEFT POSTEROLATERAL BRANCH   HIP FRACTURE SURGERY Right 06/2010   "3 metal screws"    INSERT / REPLACE / REMOVE PACEMAKER  11/05/2014   LOOP RECORDER EXPLANT N/A 11/05/2014   Procedure: LOOP RECORDER EXPLANT;  Surgeon: Sanda Klein, MD;  Location: [facility] CATH LAB;  Service: Cardiovascular;  Laterality: N/A;   LOOP RECORDER IMPLANT N/A 09/17/2013   Procedure: LOOP RECORDER IMPLANT;  Surgeon: Sanda Klein, MD;  Location: [facility] CATH LAB;  Service: Cardiovascular;  Laterality: N/A;   PERMANENT PACEMAKER INSERTION N/A 11/05/2014   Procedure: PERMANENT PACEMAKER INSERTION;  Surgeon: Sanda Klein, MD;  Location: [facility] CATH LAB;  Service: Cardiovascular;  Laterality: N/A;   REFRACTIVE SURGERY Bilateral    /notes 04/28/2008 (08/17/2013)   RETINAL DETACHMENT SURGERY Left 1994   Archie Endo 04/10/2008 (08/17/2013)   TIBIAL TUBERCLERPLASTY  11/05/2014   tooth pulled   06/2018   TOTAL HIP ARTHROPLASTY Right 07/31/2018   Procedure: RIGHT TOTAL HIP ARTHROPLASTY ANTERIOR APPROACH;  Surgeon: Frederik Pear, MD;  Location: [facility];  Service: Orthopedics;  Laterality: Right;   TOTAL HIP ARTHROPLASTY Left 10/31/2022   Procedure:  TOTAL HIP ARTHROPLASTY ANTERIOR APPROACH;  Surgeon: Paralee Cancel, MD;  Location: [facility];  Service: Orthopedics;  Laterality: Left;   UTERINE FIBROID SURGERY  1960's     Current Meds  Medication Sig   alendronate (FOSAMAX) 70 MG tablet Take 1 tablet (70 mg total) by mouth once a week. No food or other medications for 30 min. Avoid laying flat for two hours.   aspirin EC (ASPIRIN LOW DOSE) 81 MG tablet Take 81 mg twice daily for 4 weeks and then once daily as before   atorvastatin (LIPITOR) 40 MG tablet TAKE 1 TABLET BY MOUTH DAILY FOR CHOLESTEROL (Patient taking differently: Take 40 mg by mouth daily.)   brimonidine (ALPHAGAN) 0.2 % ophthalmic solution Place 1 drop into the left eye 2 (two) times daily.   Calcium Carbonate-Vit D-Min (CALTRATE 600+D PLUS MINERALS) 600-800 MG-UNIT TABS Take 1 tablet by mouth twice daily for calcium and vitamin D. (Patient taking differently: Take 1 tablet by mouth daily with  supper.)   docusate sodium (COLACE) 100 MG capsule Take 1 capsule (100 mg total) by mouth 2 (two) times daily.   fluticasone (FLONASE) 50 MCG/ACT nasal spray PLACE 1 SPRAY INTO BOTH NOSTRILS DAILY   fluticasone-salmeterol (ADVAIR HFA) 45-21 MCG/ACT inhaler Inhale 1 puff into the lungs 2 (two) times daily.   levothyroxine (SYNTHROID) 50 MCG tablet TAKE 1 TABLET BY MOUTH EVERY MORNING ON AN EMPTY STOMACH WITH WATER ONLY. NO FOOD OR OTHER MEDICATIONS FOR 30 MINS. (Patient taking differently: Take 50 mcg by mouth daily before breakfast. TAKE 1 TABLET BY MOUTH EVERY MORNING ON AN EMPTY STOMACH WITH WATER ONLY. NO FOOD OR OTHER MEDICATIONS FOR 30 MINS.)   [DISCONTINUED] furosemide (LASIX) 40 MG tablet TAKE 1 TABLET BY MOUTH DAILY   [DISCONTINUED] losartan (COZAAR) 100 MG tablet TAKE 1 TABLET BY MOUTH DAILY FOR BLOOD PRESSURE   [DISCONTINUED] potassium chloride (KLOR-CON M) 10 MEQ tablet TAKE 2 TABLETS BY MOUTH 2 TIMES DAILY     Allergies:   Codeine and Morphine and related   Social History    Tobacco Use   Smoking status: Never   Smokeless tobacco: Never  Vaping Use   Vaping Use: Never used  Substance Use Topics   Alcohol use: No    Alcohol/week: 0.0 standard drinks of alcohol   Drug use: No     Family Hx: The patient's family history includes Diabetes in her mother; Heart failure in her mother; Hyperlipidemia in her mother; Hypertension in her mother; Melanoma in her grandchild. There is no history of Colon cancer.  ROS:   Please see the history of present illness.   All other systems are reviewed and are negative.  Prior CV studies:   The following studies were reviewed today: Comprehensive pacemaker check in the office  Echocardiogram 11/02/2022    1. Left ventricular ejection fraction, by estimation, is 60 to 65%. The  left ventricle has normal function. The left ventricle has no regional  wall motion abnormalities. Indeterminate diastolic filling due to E-A  fusion.   2. Right ventricular systolic function is normal. The right ventricular  size is normal. Tricuspid regurgitation signal is inadequate for assessing  PA pressure.   3. Left atrial size was mildly dilated.   4. The mitral valve is degenerative. Trivial mitral valve regurgitation.  Mild mitral stenosis. The mean mitral valve gradient is 5.0 mmHg with  average heart rate of 77 bpm.   5. 21 mm Magna Ease aortic valve prosthesis (implant 03/29/2008). Vmax 2.4  m/s, MG 12 mmHG, EOA 1.26 cm2, DI 0.45. Gradients within limits. The  aortic valve has been repaired/replaced. Aortic valve regurgitation is not  visualized. There is a 21 mm  bioprosthetic valve present in the aortic position. Procedure Date:  03/29/2008.   Comparison(s): No significant change from prior study.    Labs/Other Tests and Data Reviewed:    EKG:   ECG ordered 10/29/2022 and personally viewed shows sinus rhythm with PACs, old bifascicular block (RBBB plus LAFB, no acute ischemic changes.  Recent Labs: 08/06/2022: TSH  2.72 11/01/2022: ALT 19 11/02/2022: Magnesium 2.4 11/26/2022: BUN 11; Creatinine, Ser 1.17; Hemoglobin 11.0; Platelets 263.0; Potassium 3.6; Sodium 141   Recent Lipid Panel Lab Results  Component Value Date/Time   CHOL 137 10/22/2022 06:00 AM   TRIG 103 10/22/2022 06:00 AM   HDL 40 (L) 10/22/2022 06:00 AM   CHOLHDL 3.4 10/22/2022 06:00 AM   LDLCALC 76 10/22/2022 06:00 AM   LDLCALC 65 08/06/2022 03:42 PM  Wt Readings from Last 3 Encounters:  12/27/22 148 lb 3.2 oz (67.2 kg)  11/26/22 152 lb (68.9 kg)  11/01/22 156 lb 8.4 oz (71 kg)     Objective:    Vital Signs:  BP 110/64 (BP Location: Left Arm, Patient Position: Sitting, Cuff Size: Normal)   Pulse 80   Ht '5\' 9"'$  (1.753 m)   Wt 148 lb 3.2 oz (67.2 kg)   SpO2 99%   BMI 21.89 kg/m     General: Alert, oriented x3, no distress, appears quite lean.  The left subclavian pacemaker site looks healthy. Head: no evidence of trauma, PERRL, EOMI, no exophtalmos or lid lag, no myxedema, no xanthelasma; normal ears, nose and oropharynx Neck: normal jugular venous pulsations and no hepatojugular reflux; brisk carotid pulses without delay and no carotid bruits Chest: clear to auscultation, no signs of consolidation by percussion or palpation, normal fremitus, symmetrical and full respiratory excursions Cardiovascular: normal position and quality of the apical impulse, regular rhythm with occasional ectopy, normal first and widely split second heart sounds, no murmurs, rubs or gallops Abdomen: no tenderness or distention, no masses by palpation, no abnormal pulsatility or arterial bruits, normal bowel sounds, no hepatosplenomegaly Extremities: no clubbing, cyanosis or edema; 2+ radial, ulnar and brachial pulses bilaterally; 2+ right femoral, posterior tibial and dorsalis pedis pulses; 2+ left femoral, posterior tibial and dorsalis pedis pulses; no subclavian or femoral bruits Neurological: grossly nonfocal Psych: Normal mood and  affect     ASSESSMENT & PLAN:    1. Chronic diastolic heart failure ([place])   2. S/P aortic valve replacement with bioprosthetic valve   3. Coronary artery disease involving native coronary artery of native heart without angina pectoris   4. MVP (mitral valve prolapse)   5. Pacemaker   6. Infrarenal abdominal aortic aneurysm (AAA) without rupture ([place])   7. PAD (peripheral artery disease) ([place])   8. Hypercholesterolemia   9. Essential hypertension   10. Late onset Alzheimer's dementia without behavioral disturbance (H[facility])        CHF: Clinically euvolemic.  She's lost weight now.  Will decrease her dose of furosemide and potassium supplement as well as a dose of losartan to avoid hypotension.  Clearly our previous estimation of her dry weight 170 pounds no longer holds true, rather closer to 150 pounds 2 pounds.   Hx AVR: Echocardiogram was performed during her recent hospital stay and shows normal prosthetic valve function CAD s/p CABG: She does not have angina pectoris.  Asymptomatic.  Preserved LV systolic function. MVP/MR: Trivial MR on recent echo PAT/AFib: Continues to be at risk for falls and the burden of atrial arrhythmias extremely low, with the vast majority of episodes are presenting ectopic atrial tachycardia and not true atrial fibrillation in the longest episodes is only a few minutes long..  Clearly at risk for recurrent atrial fibrillation, since she continues have frequent episodes of brief atrial tachycardia.  She has never had a stroke or TIA.  We will keep her off anticoagulation for the time being, with the option to restart anticoagulants if long episodes of atrial fibrillation are detected.   PM: Normal device AAA: Asymptomatic.  This was relatively small at 3.7 cm and has not been reevaluated since September 2018.  Due to her deteriorating cognitive status, no plan for preventative surgery so we are not monitoring this on a regular basis. PAD: She has some stenosis  of the mid aorta and 50% bilateral common iliac artery stenosis.  Denies any claudication.  HLP: LDL cholesterol very close to target HTN: Blood pressures fairly low today and she has a history of orthostatic hypotension and falls.  Will reduce the dose of losartan, furosemide and potassium supplement.. Alzheimer's dementia: Gradually worsening cognitive status.  Now has DNR order in.  Patient Instructions  Medication Instructions:  Decrease Furosemide to '20mg'$  a day Decrease Losartan to '50mg'$  a day Decrease Potassium Chloride to 79mq a day *If you need a refill on your cardiac medications before your next appointment, please call your pharmacy*  Follow-Up: [facility] and your [facility] needs are our [facility].  As part of our continuing mission to provide you with exceptional heart care, we have created designated Provider Care Teams.  These Care Teams include your primary Cardiologist (physician) and Advanced Practice Providers (APPs -  Physician Assistants and Nurse Practitioners) who all work together to provide you with the care you need, when you need it.  We recommend signing up for the patient portal called "MyChart".  Sign up information is provided on this After Visit Summary.  MyChart is used to connect with patients for Virtual Visits (Telemedicine).  Patients are able to view lab/test results, encounter notes, upcoming appointments, etc.  Non-urgent messages can be sent to your provider as well.   To learn more about what you can do with MyChart, go to [email or web address]    Your next appointment:   1 year(s)  Provider:   MSanda Klein MD      Signed, MSanda Klein MD  12/27/2022 2:43 PM    [place]

## 2022-12-27 NOTE — Patient Instructions (Signed)
Medication Instructions:  Decrease Furosemide to '20mg'$  a day Decrease Losartan to '50mg'$  a day Decrease Potassium Chloride to 82mq a day *If you need a refill on your cardiac medications before your next appointment, please call your pharmacy*  Follow-Up: [facility] and your [facility] needs are our [facility].  As part of our continuing mission to provide you with exceptional heart care, we have created designated Provider Care Teams.  These Care Teams include your primary Cardiologist (physician) and Advanced Practice Providers (APPs -  Physician Assistants and Nurse Practitioners) who all work together to provide you with the care you need, when you need it.  We recommend signing up for the patient portal called "MyChart".  Sign up information is provided on this After Visit Summary.  MyChart is used to connect with patients for Virtual Visits (Telemedicine).  Patients are able to view lab/test results, encounter notes, upcoming appointments, etc.  Non-urgent messages can be sent to your provider as well.   To learn more about what you can do with MyChart, go to [email or web address]    Your next appointment:   1 year(s)  Provider:   MSanda Klein MD

## 2022-12-28 DIAGNOSIS — Z952 Presence of prosthetic heart valve: Secondary | ICD-10-CM | POA: Diagnosis not present

## 2022-12-28 DIAGNOSIS — J4530 Mild persistent asthma, uncomplicated: Secondary | ICD-10-CM | POA: Diagnosis not present

## 2022-12-28 DIAGNOSIS — D649 Anemia, unspecified: Secondary | ICD-10-CM | POA: Diagnosis not present

## 2022-12-28 DIAGNOSIS — M1612 Unilateral primary osteoarthritis, left hip: Secondary | ICD-10-CM | POA: Diagnosis not present

## 2022-12-28 DIAGNOSIS — G309 Alzheimer's disease, unspecified: Secondary | ICD-10-CM | POA: Diagnosis not present

## 2022-12-28 DIAGNOSIS — I129 Hypertensive chronic kidney disease with stage 1 through stage 4 chronic kidney disease, or unspecified chronic kidney disease: Secondary | ICD-10-CM | POA: Diagnosis not present

## 2022-12-28 DIAGNOSIS — G43909 Migraine, unspecified, not intractable, without status migrainosus: Secondary | ICD-10-CM | POA: Diagnosis not present

## 2022-12-28 DIAGNOSIS — I441 Atrioventricular block, second degree: Secondary | ICD-10-CM | POA: Diagnosis not present

## 2022-12-28 DIAGNOSIS — F0283 Dementia in other diseases classified elsewhere, unspecified severity, with mood disturbance: Secondary | ICD-10-CM | POA: Diagnosis not present

## 2022-12-28 DIAGNOSIS — Z96641 Presence of right artificial hip joint: Secondary | ICD-10-CM | POA: Diagnosis not present

## 2022-12-28 DIAGNOSIS — I447 Left bundle-branch block, unspecified: Secondary | ICD-10-CM | POA: Diagnosis not present

## 2022-12-28 DIAGNOSIS — R350 Frequency of micturition: Secondary | ICD-10-CM | POA: Diagnosis not present

## 2022-12-28 DIAGNOSIS — N183 Chronic kidney disease, stage 3 unspecified: Secondary | ICD-10-CM | POA: Diagnosis not present

## 2022-12-28 DIAGNOSIS — I2510 Atherosclerotic heart disease of native coronary artery without angina pectoris: Secondary | ICD-10-CM | POA: Diagnosis not present

## 2022-12-28 DIAGNOSIS — M792 Neuralgia and neuritis, unspecified: Secondary | ICD-10-CM | POA: Diagnosis not present

## 2022-12-28 DIAGNOSIS — I714 Abdominal aortic aneurysm, without rupture, unspecified: Secondary | ICD-10-CM | POA: Diagnosis not present

## 2022-12-28 DIAGNOSIS — E039 Hypothyroidism, unspecified: Secondary | ICD-10-CM | POA: Diagnosis not present

## 2022-12-28 DIAGNOSIS — E785 Hyperlipidemia, unspecified: Secondary | ICD-10-CM | POA: Diagnosis not present

## 2022-12-28 DIAGNOSIS — Z79899 Other long term (current) drug therapy: Secondary | ICD-10-CM | POA: Diagnosis not present

## 2022-12-28 DIAGNOSIS — Z950 Presence of cardiac pacemaker: Secondary | ICD-10-CM | POA: Diagnosis not present

## 2022-12-28 DIAGNOSIS — Z951 Presence of aortocoronary bypass graft: Secondary | ICD-10-CM | POA: Diagnosis not present

## 2022-12-28 DIAGNOSIS — M810 Age-related osteoporosis without current pathological fracture: Secondary | ICD-10-CM | POA: Diagnosis not present

## 2022-12-28 DIAGNOSIS — Z7982 Long term (current) use of aspirin: Secondary | ICD-10-CM | POA: Diagnosis not present

## 2022-12-30 DIAGNOSIS — Z79899 Other long term (current) drug therapy: Secondary | ICD-10-CM | POA: Diagnosis not present

## 2022-12-30 DIAGNOSIS — I2510 Atherosclerotic heart disease of native coronary artery without angina pectoris: Secondary | ICD-10-CM | POA: Diagnosis not present

## 2022-12-30 DIAGNOSIS — R350 Frequency of micturition: Secondary | ICD-10-CM | POA: Diagnosis not present

## 2022-12-30 DIAGNOSIS — Z952 Presence of prosthetic heart valve: Secondary | ICD-10-CM | POA: Diagnosis not present

## 2022-12-30 DIAGNOSIS — D649 Anemia, unspecified: Secondary | ICD-10-CM | POA: Diagnosis not present

## 2022-12-30 DIAGNOSIS — I714 Abdominal aortic aneurysm, without rupture, unspecified: Secondary | ICD-10-CM | POA: Diagnosis not present

## 2022-12-30 DIAGNOSIS — Z950 Presence of cardiac pacemaker: Secondary | ICD-10-CM | POA: Diagnosis not present

## 2022-12-30 DIAGNOSIS — Z96641 Presence of right artificial hip joint: Secondary | ICD-10-CM | POA: Diagnosis not present

## 2022-12-30 DIAGNOSIS — N183 Chronic kidney disease, stage 3 unspecified: Secondary | ICD-10-CM | POA: Diagnosis not present

## 2022-12-30 DIAGNOSIS — Z951 Presence of aortocoronary bypass graft: Secondary | ICD-10-CM | POA: Diagnosis not present

## 2022-12-30 DIAGNOSIS — J4530 Mild persistent asthma, uncomplicated: Secondary | ICD-10-CM | POA: Diagnosis not present

## 2022-12-30 DIAGNOSIS — I129 Hypertensive chronic kidney disease with stage 1 through stage 4 chronic kidney disease, or unspecified chronic kidney disease: Secondary | ICD-10-CM | POA: Diagnosis not present

## 2022-12-30 DIAGNOSIS — M1612 Unilateral primary osteoarthritis, left hip: Secondary | ICD-10-CM | POA: Diagnosis not present

## 2022-12-30 DIAGNOSIS — G309 Alzheimer's disease, unspecified: Secondary | ICD-10-CM | POA: Diagnosis not present

## 2022-12-30 DIAGNOSIS — I447 Left bundle-branch block, unspecified: Secondary | ICD-10-CM | POA: Diagnosis not present

## 2022-12-30 DIAGNOSIS — Z7982 Long term (current) use of aspirin: Secondary | ICD-10-CM | POA: Diagnosis not present

## 2022-12-30 DIAGNOSIS — M810 Age-related osteoporosis without current pathological fracture: Secondary | ICD-10-CM | POA: Diagnosis not present

## 2022-12-30 DIAGNOSIS — G43909 Migraine, unspecified, not intractable, without status migrainosus: Secondary | ICD-10-CM | POA: Diagnosis not present

## 2022-12-30 DIAGNOSIS — M792 Neuralgia and neuritis, unspecified: Secondary | ICD-10-CM | POA: Diagnosis not present

## 2022-12-30 DIAGNOSIS — F0283 Dementia in other diseases classified elsewhere, unspecified severity, with mood disturbance: Secondary | ICD-10-CM | POA: Diagnosis not present

## 2022-12-30 DIAGNOSIS — I441 Atrioventricular block, second degree: Secondary | ICD-10-CM | POA: Diagnosis not present

## 2022-12-30 DIAGNOSIS — E785 Hyperlipidemia, unspecified: Secondary | ICD-10-CM | POA: Diagnosis not present

## 2022-12-30 DIAGNOSIS — E039 Hypothyroidism, unspecified: Secondary | ICD-10-CM | POA: Diagnosis not present

## 2022-12-31 DIAGNOSIS — E039 Hypothyroidism, unspecified: Secondary | ICD-10-CM | POA: Diagnosis not present

## 2022-12-31 DIAGNOSIS — M810 Age-related osteoporosis without current pathological fracture: Secondary | ICD-10-CM | POA: Diagnosis not present

## 2022-12-31 DIAGNOSIS — R350 Frequency of micturition: Secondary | ICD-10-CM | POA: Diagnosis not present

## 2022-12-31 DIAGNOSIS — N183 Chronic kidney disease, stage 3 unspecified: Secondary | ICD-10-CM | POA: Diagnosis not present

## 2022-12-31 DIAGNOSIS — J4530 Mild persistent asthma, uncomplicated: Secondary | ICD-10-CM | POA: Diagnosis not present

## 2022-12-31 DIAGNOSIS — Z950 Presence of cardiac pacemaker: Secondary | ICD-10-CM | POA: Diagnosis not present

## 2022-12-31 DIAGNOSIS — Z96641 Presence of right artificial hip joint: Secondary | ICD-10-CM | POA: Diagnosis not present

## 2022-12-31 DIAGNOSIS — M1612 Unilateral primary osteoarthritis, left hip: Secondary | ICD-10-CM | POA: Diagnosis not present

## 2022-12-31 DIAGNOSIS — I2510 Atherosclerotic heart disease of native coronary artery without angina pectoris: Secondary | ICD-10-CM | POA: Diagnosis not present

## 2022-12-31 DIAGNOSIS — Z951 Presence of aortocoronary bypass graft: Secondary | ICD-10-CM | POA: Diagnosis not present

## 2022-12-31 DIAGNOSIS — Z952 Presence of prosthetic heart valve: Secondary | ICD-10-CM | POA: Diagnosis not present

## 2022-12-31 DIAGNOSIS — Z79899 Other long term (current) drug therapy: Secondary | ICD-10-CM | POA: Diagnosis not present

## 2022-12-31 DIAGNOSIS — F0283 Dementia in other diseases classified elsewhere, unspecified severity, with mood disturbance: Secondary | ICD-10-CM | POA: Diagnosis not present

## 2022-12-31 DIAGNOSIS — I441 Atrioventricular block, second degree: Secondary | ICD-10-CM | POA: Diagnosis not present

## 2022-12-31 DIAGNOSIS — D649 Anemia, unspecified: Secondary | ICD-10-CM | POA: Diagnosis not present

## 2022-12-31 DIAGNOSIS — E785 Hyperlipidemia, unspecified: Secondary | ICD-10-CM | POA: Diagnosis not present

## 2022-12-31 DIAGNOSIS — G43909 Migraine, unspecified, not intractable, without status migrainosus: Secondary | ICD-10-CM | POA: Diagnosis not present

## 2022-12-31 DIAGNOSIS — M792 Neuralgia and neuritis, unspecified: Secondary | ICD-10-CM | POA: Diagnosis not present

## 2022-12-31 DIAGNOSIS — I129 Hypertensive chronic kidney disease with stage 1 through stage 4 chronic kidney disease, or unspecified chronic kidney disease: Secondary | ICD-10-CM | POA: Diagnosis not present

## 2022-12-31 DIAGNOSIS — I447 Left bundle-branch block, unspecified: Secondary | ICD-10-CM | POA: Diagnosis not present

## 2022-12-31 DIAGNOSIS — Z7982 Long term (current) use of aspirin: Secondary | ICD-10-CM | POA: Diagnosis not present

## 2022-12-31 DIAGNOSIS — I714 Abdominal aortic aneurysm, without rupture, unspecified: Secondary | ICD-10-CM | POA: Diagnosis not present

## 2022-12-31 DIAGNOSIS — G309 Alzheimer's disease, unspecified: Secondary | ICD-10-CM | POA: Diagnosis not present

## 2023-01-04 ENCOUNTER — Other Ambulatory Visit: Payer: Self-pay

## 2023-01-04 DIAGNOSIS — K5900 Constipation, unspecified: Secondary | ICD-10-CM

## 2023-01-04 DIAGNOSIS — M810 Age-related osteoporosis without current pathological fracture: Secondary | ICD-10-CM

## 2023-01-05 MED ORDER — CALTRATE 600+D PLUS MINERALS 600-800 MG-UNIT PO TABS: ORAL_TABLET | ORAL | 3 refills | Status: AC

## 2023-01-05 MED ORDER — DOCUSATE SODIUM 100 MG PO CAPS: 100.0000 mg | ORAL_CAPSULE | Freq: Two times a day (BID) | ORAL | 0 refills | Status: DC | PRN

## 2023-01-06 DIAGNOSIS — I714 Abdominal aortic aneurysm, without rupture, unspecified: Secondary | ICD-10-CM | POA: Diagnosis not present

## 2023-01-06 DIAGNOSIS — Z952 Presence of prosthetic heart valve: Secondary | ICD-10-CM | POA: Diagnosis not present

## 2023-01-06 DIAGNOSIS — Z79899 Other long term (current) drug therapy: Secondary | ICD-10-CM | POA: Diagnosis not present

## 2023-01-06 DIAGNOSIS — N183 Chronic kidney disease, stage 3 unspecified: Secondary | ICD-10-CM | POA: Diagnosis not present

## 2023-01-06 DIAGNOSIS — Z951 Presence of aortocoronary bypass graft: Secondary | ICD-10-CM | POA: Diagnosis not present

## 2023-01-06 DIAGNOSIS — D649 Anemia, unspecified: Secondary | ICD-10-CM | POA: Diagnosis not present

## 2023-01-06 DIAGNOSIS — I129 Hypertensive chronic kidney disease with stage 1 through stage 4 chronic kidney disease, or unspecified chronic kidney disease: Secondary | ICD-10-CM | POA: Diagnosis not present

## 2023-01-06 DIAGNOSIS — R350 Frequency of micturition: Secondary | ICD-10-CM | POA: Diagnosis not present

## 2023-01-06 DIAGNOSIS — E039 Hypothyroidism, unspecified: Secondary | ICD-10-CM | POA: Diagnosis not present

## 2023-01-06 DIAGNOSIS — I441 Atrioventricular block, second degree: Secondary | ICD-10-CM | POA: Diagnosis not present

## 2023-01-06 DIAGNOSIS — Z96641 Presence of right artificial hip joint: Secondary | ICD-10-CM | POA: Diagnosis not present

## 2023-01-06 DIAGNOSIS — I447 Left bundle-branch block, unspecified: Secondary | ICD-10-CM | POA: Diagnosis not present

## 2023-01-06 DIAGNOSIS — G309 Alzheimer's disease, unspecified: Secondary | ICD-10-CM | POA: Diagnosis not present

## 2023-01-06 DIAGNOSIS — M810 Age-related osteoporosis without current pathological fracture: Secondary | ICD-10-CM | POA: Diagnosis not present

## 2023-01-06 DIAGNOSIS — Z7982 Long term (current) use of aspirin: Secondary | ICD-10-CM | POA: Diagnosis not present

## 2023-01-06 DIAGNOSIS — J4530 Mild persistent asthma, uncomplicated: Secondary | ICD-10-CM | POA: Diagnosis not present

## 2023-01-06 DIAGNOSIS — M1612 Unilateral primary osteoarthritis, left hip: Secondary | ICD-10-CM | POA: Diagnosis not present

## 2023-01-06 DIAGNOSIS — Z950 Presence of cardiac pacemaker: Secondary | ICD-10-CM | POA: Diagnosis not present

## 2023-01-06 DIAGNOSIS — G43909 Migraine, unspecified, not intractable, without status migrainosus: Secondary | ICD-10-CM | POA: Diagnosis not present

## 2023-01-06 DIAGNOSIS — E785 Hyperlipidemia, unspecified: Secondary | ICD-10-CM | POA: Diagnosis not present

## 2023-01-06 DIAGNOSIS — F0283 Dementia in other diseases classified elsewhere, unspecified severity, with mood disturbance: Secondary | ICD-10-CM | POA: Diagnosis not present

## 2023-01-06 DIAGNOSIS — I2510 Atherosclerotic heart disease of native coronary artery without angina pectoris: Secondary | ICD-10-CM | POA: Diagnosis not present

## 2023-01-06 DIAGNOSIS — M792 Neuralgia and neuritis, unspecified: Secondary | ICD-10-CM | POA: Diagnosis not present

## 2023-01-10 DIAGNOSIS — I2510 Atherosclerotic heart disease of native coronary artery without angina pectoris: Secondary | ICD-10-CM | POA: Diagnosis not present

## 2023-01-10 DIAGNOSIS — R350 Frequency of micturition: Secondary | ICD-10-CM | POA: Diagnosis not present

## 2023-01-10 DIAGNOSIS — Z950 Presence of cardiac pacemaker: Secondary | ICD-10-CM | POA: Diagnosis not present

## 2023-01-10 DIAGNOSIS — Z952 Presence of prosthetic heart valve: Secondary | ICD-10-CM | POA: Diagnosis not present

## 2023-01-10 DIAGNOSIS — I129 Hypertensive chronic kidney disease with stage 1 through stage 4 chronic kidney disease, or unspecified chronic kidney disease: Secondary | ICD-10-CM | POA: Diagnosis not present

## 2023-01-10 DIAGNOSIS — Z79899 Other long term (current) drug therapy: Secondary | ICD-10-CM | POA: Diagnosis not present

## 2023-01-10 DIAGNOSIS — M1612 Unilateral primary osteoarthritis, left hip: Secondary | ICD-10-CM | POA: Diagnosis not present

## 2023-01-10 DIAGNOSIS — Z96641 Presence of right artificial hip joint: Secondary | ICD-10-CM | POA: Diagnosis not present

## 2023-01-10 DIAGNOSIS — I714 Abdominal aortic aneurysm, without rupture, unspecified: Secondary | ICD-10-CM | POA: Diagnosis not present

## 2023-01-10 DIAGNOSIS — N183 Chronic kidney disease, stage 3 unspecified: Secondary | ICD-10-CM | POA: Diagnosis not present

## 2023-01-10 DIAGNOSIS — M810 Age-related osteoporosis without current pathological fracture: Secondary | ICD-10-CM | POA: Diagnosis not present

## 2023-01-10 DIAGNOSIS — D649 Anemia, unspecified: Secondary | ICD-10-CM | POA: Diagnosis not present

## 2023-01-10 DIAGNOSIS — Z951 Presence of aortocoronary bypass graft: Secondary | ICD-10-CM | POA: Diagnosis not present

## 2023-01-10 DIAGNOSIS — I441 Atrioventricular block, second degree: Secondary | ICD-10-CM | POA: Diagnosis not present

## 2023-01-10 DIAGNOSIS — M792 Neuralgia and neuritis, unspecified: Secondary | ICD-10-CM | POA: Diagnosis not present

## 2023-01-10 DIAGNOSIS — G43909 Migraine, unspecified, not intractable, without status migrainosus: Secondary | ICD-10-CM | POA: Diagnosis not present

## 2023-01-10 DIAGNOSIS — E039 Hypothyroidism, unspecified: Secondary | ICD-10-CM | POA: Diagnosis not present

## 2023-01-10 DIAGNOSIS — I447 Left bundle-branch block, unspecified: Secondary | ICD-10-CM | POA: Diagnosis not present

## 2023-01-10 DIAGNOSIS — J4530 Mild persistent asthma, uncomplicated: Secondary | ICD-10-CM | POA: Diagnosis not present

## 2023-01-10 DIAGNOSIS — F0283 Dementia in other diseases classified elsewhere, unspecified severity, with mood disturbance: Secondary | ICD-10-CM | POA: Diagnosis not present

## 2023-01-10 DIAGNOSIS — Z7982 Long term (current) use of aspirin: Secondary | ICD-10-CM | POA: Diagnosis not present

## 2023-01-10 DIAGNOSIS — G309 Alzheimer's disease, unspecified: Secondary | ICD-10-CM | POA: Diagnosis not present

## 2023-01-10 DIAGNOSIS — E785 Hyperlipidemia, unspecified: Secondary | ICD-10-CM | POA: Diagnosis not present

## 2023-01-11 NOTE — Progress Notes (Signed)
Remote pacemaker transmission.   

## 2023-01-12 DIAGNOSIS — R350 Frequency of micturition: Secondary | ICD-10-CM | POA: Diagnosis not present

## 2023-01-12 DIAGNOSIS — I2510 Atherosclerotic heart disease of native coronary artery without angina pectoris: Secondary | ICD-10-CM | POA: Diagnosis not present

## 2023-01-12 DIAGNOSIS — I129 Hypertensive chronic kidney disease with stage 1 through stage 4 chronic kidney disease, or unspecified chronic kidney disease: Secondary | ICD-10-CM | POA: Diagnosis not present

## 2023-01-12 DIAGNOSIS — N183 Chronic kidney disease, stage 3 unspecified: Secondary | ICD-10-CM | POA: Diagnosis not present

## 2023-01-12 DIAGNOSIS — J4530 Mild persistent asthma, uncomplicated: Secondary | ICD-10-CM | POA: Diagnosis not present

## 2023-01-12 DIAGNOSIS — Z950 Presence of cardiac pacemaker: Secondary | ICD-10-CM | POA: Diagnosis not present

## 2023-01-12 DIAGNOSIS — Z7982 Long term (current) use of aspirin: Secondary | ICD-10-CM | POA: Diagnosis not present

## 2023-01-12 DIAGNOSIS — M792 Neuralgia and neuritis, unspecified: Secondary | ICD-10-CM | POA: Diagnosis not present

## 2023-01-12 DIAGNOSIS — Z952 Presence of prosthetic heart valve: Secondary | ICD-10-CM | POA: Diagnosis not present

## 2023-01-12 DIAGNOSIS — Z96641 Presence of right artificial hip joint: Secondary | ICD-10-CM | POA: Diagnosis not present

## 2023-01-12 DIAGNOSIS — G43909 Migraine, unspecified, not intractable, without status migrainosus: Secondary | ICD-10-CM | POA: Diagnosis not present

## 2023-01-12 DIAGNOSIS — Z79899 Other long term (current) drug therapy: Secondary | ICD-10-CM | POA: Diagnosis not present

## 2023-01-12 DIAGNOSIS — E785 Hyperlipidemia, unspecified: Secondary | ICD-10-CM | POA: Diagnosis not present

## 2023-01-12 DIAGNOSIS — G309 Alzheimer's disease, unspecified: Secondary | ICD-10-CM | POA: Diagnosis not present

## 2023-01-12 DIAGNOSIS — E039 Hypothyroidism, unspecified: Secondary | ICD-10-CM | POA: Diagnosis not present

## 2023-01-12 DIAGNOSIS — M810 Age-related osteoporosis without current pathological fracture: Secondary | ICD-10-CM | POA: Diagnosis not present

## 2023-01-12 DIAGNOSIS — I447 Left bundle-branch block, unspecified: Secondary | ICD-10-CM | POA: Diagnosis not present

## 2023-01-12 DIAGNOSIS — M1612 Unilateral primary osteoarthritis, left hip: Secondary | ICD-10-CM | POA: Diagnosis not present

## 2023-01-12 DIAGNOSIS — D649 Anemia, unspecified: Secondary | ICD-10-CM | POA: Diagnosis not present

## 2023-01-12 DIAGNOSIS — Z951 Presence of aortocoronary bypass graft: Secondary | ICD-10-CM | POA: Diagnosis not present

## 2023-01-12 DIAGNOSIS — F0283 Dementia in other diseases classified elsewhere, unspecified severity, with mood disturbance: Secondary | ICD-10-CM | POA: Diagnosis not present

## 2023-01-12 DIAGNOSIS — I441 Atrioventricular block, second degree: Secondary | ICD-10-CM | POA: Diagnosis not present

## 2023-01-12 DIAGNOSIS — I714 Abdominal aortic aneurysm, without rupture, unspecified: Secondary | ICD-10-CM | POA: Diagnosis not present

## 2023-01-14 ENCOUNTER — Telehealth: Payer: Self-pay

## 2023-01-14 NOTE — Patient Outreach (Signed)
  Care Coordination   Initial Visit Note   01/14/2023 Name: ZEKIA GRAHEK MRN: [id number] DOB: 1934-07-26  Armond Hang is a 89 y.o. year old female who sees Pleas Koch, NP for primary care. I  spoke with daughter/ DPR Myna Hidalgo  What matters to the patients health and wellness today?  Daughter states patient has Alzheimers and is currently staying in an Assisted Living facility that is managing her care.  Daughter states she transports patients to all of her appointments.  She states patient has no nursing and / or community resource needs at this time.  Declines care coordination follow up.    Goals Addressed             This Visit's Progress    COMPLETED: Care coordination activities - no further follow up needed       Interventions Today    Flowsheet Row Most Recent Value  General Interventions   General Interventions Discussed/Reviewed General Interventions Discussed, Vaccines  [[place] coordination program/ services discussed. SDOH survey completed. AWV discussed and advised to contact provider office to schedule.  Advised to contact primary care provider if care coordination services needed in the future. Vaccinations discussed.]              SDOH assessments and interventions completed:  Yes  SDOH Interventions Today    Flowsheet Row Most Recent Value  SDOH Interventions   Food Insecurity Interventions Intervention Not Indicated  Housing Interventions Intervention Not Indicated  Transportation Interventions Intervention Not Indicated        Care Coordination Interventions:  No, not indicated   Follow up plan: No further follow up needed.  Encounter Outcome:  Pt. Visit Completed   Quinn Plowman RN,BSN,CCM [place] [phone] direct line

## 2023-01-19 DIAGNOSIS — M1612 Unilateral primary osteoarthritis, left hip: Secondary | ICD-10-CM | POA: Diagnosis not present

## 2023-01-19 DIAGNOSIS — I129 Hypertensive chronic kidney disease with stage 1 through stage 4 chronic kidney disease, or unspecified chronic kidney disease: Secondary | ICD-10-CM | POA: Diagnosis not present

## 2023-01-19 DIAGNOSIS — Z79899 Other long term (current) drug therapy: Secondary | ICD-10-CM | POA: Diagnosis not present

## 2023-01-19 DIAGNOSIS — I714 Abdominal aortic aneurysm, without rupture, unspecified: Secondary | ICD-10-CM | POA: Diagnosis not present

## 2023-01-19 DIAGNOSIS — I447 Left bundle-branch block, unspecified: Secondary | ICD-10-CM | POA: Diagnosis not present

## 2023-01-19 DIAGNOSIS — Z951 Presence of aortocoronary bypass graft: Secondary | ICD-10-CM | POA: Diagnosis not present

## 2023-01-19 DIAGNOSIS — M792 Neuralgia and neuritis, unspecified: Secondary | ICD-10-CM | POA: Diagnosis not present

## 2023-01-19 DIAGNOSIS — E039 Hypothyroidism, unspecified: Secondary | ICD-10-CM | POA: Diagnosis not present

## 2023-01-19 DIAGNOSIS — J4530 Mild persistent asthma, uncomplicated: Secondary | ICD-10-CM | POA: Diagnosis not present

## 2023-01-19 DIAGNOSIS — N183 Chronic kidney disease, stage 3 unspecified: Secondary | ICD-10-CM | POA: Diagnosis not present

## 2023-01-19 DIAGNOSIS — Z950 Presence of cardiac pacemaker: Secondary | ICD-10-CM | POA: Diagnosis not present

## 2023-01-19 DIAGNOSIS — Z7982 Long term (current) use of aspirin: Secondary | ICD-10-CM | POA: Diagnosis not present

## 2023-01-19 DIAGNOSIS — R350 Frequency of micturition: Secondary | ICD-10-CM | POA: Diagnosis not present

## 2023-01-19 DIAGNOSIS — F0283 Dementia in other diseases classified elsewhere, unspecified severity, with mood disturbance: Secondary | ICD-10-CM | POA: Diagnosis not present

## 2023-01-19 DIAGNOSIS — I2510 Atherosclerotic heart disease of native coronary artery without angina pectoris: Secondary | ICD-10-CM | POA: Diagnosis not present

## 2023-01-19 DIAGNOSIS — G43909 Migraine, unspecified, not intractable, without status migrainosus: Secondary | ICD-10-CM | POA: Diagnosis not present

## 2023-01-19 DIAGNOSIS — Z96641 Presence of right artificial hip joint: Secondary | ICD-10-CM | POA: Diagnosis not present

## 2023-01-19 DIAGNOSIS — E785 Hyperlipidemia, unspecified: Secondary | ICD-10-CM | POA: Diagnosis not present

## 2023-01-19 DIAGNOSIS — D649 Anemia, unspecified: Secondary | ICD-10-CM | POA: Diagnosis not present

## 2023-01-19 DIAGNOSIS — M810 Age-related osteoporosis without current pathological fracture: Secondary | ICD-10-CM | POA: Diagnosis not present

## 2023-01-19 DIAGNOSIS — Z952 Presence of prosthetic heart valve: Secondary | ICD-10-CM | POA: Diagnosis not present

## 2023-01-19 DIAGNOSIS — G309 Alzheimer's disease, unspecified: Secondary | ICD-10-CM | POA: Diagnosis not present

## 2023-01-19 DIAGNOSIS — I441 Atrioventricular block, second degree: Secondary | ICD-10-CM | POA: Diagnosis not present

## 2023-01-20 DIAGNOSIS — I129 Hypertensive chronic kidney disease with stage 1 through stage 4 chronic kidney disease, or unspecified chronic kidney disease: Secondary | ICD-10-CM | POA: Diagnosis not present

## 2023-01-20 DIAGNOSIS — Z7982 Long term (current) use of aspirin: Secondary | ICD-10-CM | POA: Diagnosis not present

## 2023-01-20 DIAGNOSIS — Z951 Presence of aortocoronary bypass graft: Secondary | ICD-10-CM | POA: Diagnosis not present

## 2023-01-20 DIAGNOSIS — Z950 Presence of cardiac pacemaker: Secondary | ICD-10-CM | POA: Diagnosis not present

## 2023-01-20 DIAGNOSIS — Z79899 Other long term (current) drug therapy: Secondary | ICD-10-CM | POA: Diagnosis not present

## 2023-01-20 DIAGNOSIS — D649 Anemia, unspecified: Secondary | ICD-10-CM | POA: Diagnosis not present

## 2023-01-20 DIAGNOSIS — F0283 Dementia in other diseases classified elsewhere, unspecified severity, with mood disturbance: Secondary | ICD-10-CM | POA: Diagnosis not present

## 2023-01-20 DIAGNOSIS — I447 Left bundle-branch block, unspecified: Secondary | ICD-10-CM | POA: Diagnosis not present

## 2023-01-20 DIAGNOSIS — Z952 Presence of prosthetic heart valve: Secondary | ICD-10-CM | POA: Diagnosis not present

## 2023-01-20 DIAGNOSIS — I441 Atrioventricular block, second degree: Secondary | ICD-10-CM | POA: Diagnosis not present

## 2023-01-20 DIAGNOSIS — G309 Alzheimer's disease, unspecified: Secondary | ICD-10-CM | POA: Diagnosis not present

## 2023-01-20 DIAGNOSIS — R350 Frequency of micturition: Secondary | ICD-10-CM | POA: Diagnosis not present

## 2023-01-20 DIAGNOSIS — N183 Chronic kidney disease, stage 3 unspecified: Secondary | ICD-10-CM | POA: Diagnosis not present

## 2023-01-20 DIAGNOSIS — Z96641 Presence of right artificial hip joint: Secondary | ICD-10-CM | POA: Diagnosis not present

## 2023-01-20 DIAGNOSIS — E039 Hypothyroidism, unspecified: Secondary | ICD-10-CM | POA: Diagnosis not present

## 2023-01-20 DIAGNOSIS — G43909 Migraine, unspecified, not intractable, without status migrainosus: Secondary | ICD-10-CM | POA: Diagnosis not present

## 2023-01-20 DIAGNOSIS — M810 Age-related osteoporosis without current pathological fracture: Secondary | ICD-10-CM | POA: Diagnosis not present

## 2023-01-20 DIAGNOSIS — E785 Hyperlipidemia, unspecified: Secondary | ICD-10-CM | POA: Diagnosis not present

## 2023-01-20 DIAGNOSIS — I2510 Atherosclerotic heart disease of native coronary artery without angina pectoris: Secondary | ICD-10-CM | POA: Diagnosis not present

## 2023-01-20 DIAGNOSIS — I714 Abdominal aortic aneurysm, without rupture, unspecified: Secondary | ICD-10-CM | POA: Diagnosis not present

## 2023-01-20 DIAGNOSIS — J4530 Mild persistent asthma, uncomplicated: Secondary | ICD-10-CM | POA: Diagnosis not present

## 2023-01-20 DIAGNOSIS — M792 Neuralgia and neuritis, unspecified: Secondary | ICD-10-CM | POA: Diagnosis not present

## 2023-01-20 DIAGNOSIS — M1612 Unilateral primary osteoarthritis, left hip: Secondary | ICD-10-CM | POA: Diagnosis not present

## 2023-01-26 ENCOUNTER — Other Ambulatory Visit: Payer: Self-pay | Admitting: Primary Care

## 2023-01-26 DIAGNOSIS — I441 Atrioventricular block, second degree: Secondary | ICD-10-CM | POA: Diagnosis not present

## 2023-01-26 DIAGNOSIS — N183 Chronic kidney disease, stage 3 unspecified: Secondary | ICD-10-CM | POA: Diagnosis not present

## 2023-01-26 DIAGNOSIS — E039 Hypothyroidism, unspecified: Secondary | ICD-10-CM | POA: Diagnosis not present

## 2023-01-26 DIAGNOSIS — Z950 Presence of cardiac pacemaker: Secondary | ICD-10-CM | POA: Diagnosis not present

## 2023-01-26 DIAGNOSIS — G43909 Migraine, unspecified, not intractable, without status migrainosus: Secondary | ICD-10-CM | POA: Diagnosis not present

## 2023-01-26 DIAGNOSIS — G309 Alzheimer's disease, unspecified: Secondary | ICD-10-CM | POA: Diagnosis not present

## 2023-01-26 DIAGNOSIS — Z7982 Long term (current) use of aspirin: Secondary | ICD-10-CM | POA: Diagnosis not present

## 2023-01-26 DIAGNOSIS — Z96641 Presence of right artificial hip joint: Secondary | ICD-10-CM | POA: Diagnosis not present

## 2023-01-26 DIAGNOSIS — J4530 Mild persistent asthma, uncomplicated: Secondary | ICD-10-CM

## 2023-01-26 DIAGNOSIS — Z79899 Other long term (current) drug therapy: Secondary | ICD-10-CM | POA: Diagnosis not present

## 2023-01-26 DIAGNOSIS — M792 Neuralgia and neuritis, unspecified: Secondary | ICD-10-CM | POA: Diagnosis not present

## 2023-01-26 DIAGNOSIS — I714 Abdominal aortic aneurysm, without rupture, unspecified: Secondary | ICD-10-CM | POA: Diagnosis not present

## 2023-01-26 DIAGNOSIS — I447 Left bundle-branch block, unspecified: Secondary | ICD-10-CM | POA: Diagnosis not present

## 2023-01-26 DIAGNOSIS — F0283 Dementia in other diseases classified elsewhere, unspecified severity, with mood disturbance: Secondary | ICD-10-CM | POA: Diagnosis not present

## 2023-01-26 DIAGNOSIS — M810 Age-related osteoporosis without current pathological fracture: Secondary | ICD-10-CM | POA: Diagnosis not present

## 2023-01-26 DIAGNOSIS — E785 Hyperlipidemia, unspecified: Secondary | ICD-10-CM | POA: Diagnosis not present

## 2023-01-26 DIAGNOSIS — D649 Anemia, unspecified: Secondary | ICD-10-CM | POA: Diagnosis not present

## 2023-01-26 DIAGNOSIS — I2510 Atherosclerotic heart disease of native coronary artery without angina pectoris: Secondary | ICD-10-CM | POA: Diagnosis not present

## 2023-01-26 DIAGNOSIS — Z951 Presence of aortocoronary bypass graft: Secondary | ICD-10-CM | POA: Diagnosis not present

## 2023-01-26 DIAGNOSIS — Z952 Presence of prosthetic heart valve: Secondary | ICD-10-CM | POA: Diagnosis not present

## 2023-01-26 DIAGNOSIS — I129 Hypertensive chronic kidney disease with stage 1 through stage 4 chronic kidney disease, or unspecified chronic kidney disease: Secondary | ICD-10-CM | POA: Diagnosis not present

## 2023-01-26 DIAGNOSIS — R350 Frequency of micturition: Secondary | ICD-10-CM | POA: Diagnosis not present

## 2023-01-26 DIAGNOSIS — M1612 Unilateral primary osteoarthritis, left hip: Secondary | ICD-10-CM | POA: Diagnosis not present

## 2023-01-26 NOTE — Telephone Encounter (Signed)
Call pharmacy:  Received refill request for Adviar inhaler 250-50 mcg, however, on file we have that she has been receiving Advair 45-21 mcg inhaler.   Which one has she been filling?

## 2023-01-27 ENCOUNTER — Other Ambulatory Visit: Payer: Self-pay | Admitting: Primary Care

## 2023-01-27 DIAGNOSIS — E039 Hypothyroidism, unspecified: Secondary | ICD-10-CM | POA: Diagnosis not present

## 2023-01-27 DIAGNOSIS — I441 Atrioventricular block, second degree: Secondary | ICD-10-CM | POA: Diagnosis not present

## 2023-01-27 DIAGNOSIS — Z7982 Long term (current) use of aspirin: Secondary | ICD-10-CM | POA: Diagnosis not present

## 2023-01-27 DIAGNOSIS — G309 Alzheimer's disease, unspecified: Secondary | ICD-10-CM | POA: Diagnosis not present

## 2023-01-27 DIAGNOSIS — M810 Age-related osteoporosis without current pathological fracture: Secondary | ICD-10-CM | POA: Diagnosis not present

## 2023-01-27 DIAGNOSIS — M792 Neuralgia and neuritis, unspecified: Secondary | ICD-10-CM | POA: Diagnosis not present

## 2023-01-27 DIAGNOSIS — N183 Chronic kidney disease, stage 3 unspecified: Secondary | ICD-10-CM | POA: Diagnosis not present

## 2023-01-27 DIAGNOSIS — M1612 Unilateral primary osteoarthritis, left hip: Secondary | ICD-10-CM | POA: Diagnosis not present

## 2023-01-27 DIAGNOSIS — Z79899 Other long term (current) drug therapy: Secondary | ICD-10-CM | POA: Diagnosis not present

## 2023-01-27 DIAGNOSIS — Z951 Presence of aortocoronary bypass graft: Secondary | ICD-10-CM | POA: Diagnosis not present

## 2023-01-27 DIAGNOSIS — D649 Anemia, unspecified: Secondary | ICD-10-CM | POA: Diagnosis not present

## 2023-01-27 DIAGNOSIS — I2510 Atherosclerotic heart disease of native coronary artery without angina pectoris: Secondary | ICD-10-CM | POA: Diagnosis not present

## 2023-01-27 DIAGNOSIS — R350 Frequency of micturition: Secondary | ICD-10-CM | POA: Diagnosis not present

## 2023-01-27 DIAGNOSIS — I447 Left bundle-branch block, unspecified: Secondary | ICD-10-CM | POA: Diagnosis not present

## 2023-01-27 DIAGNOSIS — J4530 Mild persistent asthma, uncomplicated: Secondary | ICD-10-CM

## 2023-01-27 DIAGNOSIS — G43909 Migraine, unspecified, not intractable, without status migrainosus: Secondary | ICD-10-CM | POA: Diagnosis not present

## 2023-01-27 DIAGNOSIS — F0283 Dementia in other diseases classified elsewhere, unspecified severity, with mood disturbance: Secondary | ICD-10-CM | POA: Diagnosis not present

## 2023-01-27 DIAGNOSIS — Z950 Presence of cardiac pacemaker: Secondary | ICD-10-CM | POA: Diagnosis not present

## 2023-01-27 DIAGNOSIS — Z952 Presence of prosthetic heart valve: Secondary | ICD-10-CM | POA: Diagnosis not present

## 2023-01-27 DIAGNOSIS — I129 Hypertensive chronic kidney disease with stage 1 through stage 4 chronic kidney disease, or unspecified chronic kidney disease: Secondary | ICD-10-CM | POA: Diagnosis not present

## 2023-01-27 DIAGNOSIS — E785 Hyperlipidemia, unspecified: Secondary | ICD-10-CM | POA: Diagnosis not present

## 2023-01-27 DIAGNOSIS — I714 Abdominal aortic aneurysm, without rupture, unspecified: Secondary | ICD-10-CM | POA: Diagnosis not present

## 2023-01-27 DIAGNOSIS — Z96641 Presence of right artificial hip joint: Secondary | ICD-10-CM | POA: Diagnosis not present

## 2023-01-27 NOTE — Telephone Encounter (Signed)
Noted. Will proceed with the lower dose of Advair.

## 2023-01-27 NOTE — Telephone Encounter (Signed)
Call patient's daughter:  Unclear which Advair inhaler dose she has been receiving. We have been prescribing the 250-50 mcg dose inhaler for years, but another provider deleted my prescription and changed her dose to the 45-21 mcg inhaler.   Which one has she been using?

## 2023-01-27 NOTE — Telephone Encounter (Signed)
Called and spoke with pharmacy, Last filled 09/13/22 Advair 250-50 mcg.   Pharmacy received a rx for 45-21 mcg from a different provider, but patient never picked that up.

## 2023-01-27 NOTE — Telephone Encounter (Signed)
Called patients daughter, she states that [facility] has been giving her the 45-21 mcg dose. Sharyn Lull was unsure who prescribed that one as well. If you are okay with it Sharyn Lull is fine keeping her on that dose and picking up the one that the pharmacist says they have on hold for them. She seemed just as confused with the discrepancy.

## 2023-01-27 NOTE — Telephone Encounter (Signed)
Left message with patients daughter to return call to our office.

## 2023-02-14 ENCOUNTER — Other Ambulatory Visit: Payer: Self-pay | Admitting: Primary Care

## 2023-02-14 DIAGNOSIS — M8580 Other specified disorders of bone density and structure, unspecified site: Secondary | ICD-10-CM

## 2023-02-28 ENCOUNTER — Telehealth: Payer: Self-pay | Admitting: Cardiovascular Disease

## 2023-02-28 ENCOUNTER — Ambulatory Visit (INDEPENDENT_AMBULATORY_CARE_PROVIDER_SITE_OTHER): Payer: Medicare Other

## 2023-02-28 DIAGNOSIS — I441 Atrioventricular block, second degree: Secondary | ICD-10-CM

## 2023-02-28 LAB — CUP PACEART REMOTE DEVICE CHECK
Battery Impedance: 692 Ohm
Battery Remaining Longevity: 93 mo
Battery Voltage: 2.78 V
Brady Statistic AP VP Percent: 0 %
Brady Statistic AP VS Percent: 21 %
Brady Statistic AS VP Percent: 0 %
Brady Statistic AS VS Percent: 79 %
Date Time Interrogation Session: 20240505171200
Implantable Lead Connection Status: 753985
Implantable Lead Connection Status: 753985
Implantable Lead Implant Date: 20160112
Implantable Lead Implant Date: 20160112
Implantable Lead Location: 753859
Implantable Lead Location: 753860
Implantable Lead Model: 5076
Implantable Lead Model: 5076
Implantable Pulse Generator Implant Date: 20160112
Lead Channel Impedance Value: 507 Ohm
Lead Channel Impedance Value: 529 Ohm
Lead Channel Pacing Threshold Amplitude: 0.75 V
Lead Channel Pacing Threshold Amplitude: 0.75 V
Lead Channel Pacing Threshold Pulse Width: 0.4 ms
Lead Channel Pacing Threshold Pulse Width: 0.4 ms
Lead Channel Setting Pacing Amplitude: 1.5 V
Lead Channel Setting Pacing Amplitude: 2 V
Lead Channel Setting Pacing Pulse Width: 0.4 ms
Lead Channel Setting Sensing Sensitivity: 2.8 mV
Zone Setting Status: 755011
Zone Setting Status: 755011

## 2023-02-28 NOTE — Telephone Encounter (Signed)
Returned call. Advised transmission was received. Appreciative of call.

## 2023-02-28 NOTE — Telephone Encounter (Signed)
Pt's daughter called to see if Transmission for today was received since she read it last night and sent in around 5-6pm. She wanted to make sure incase she needed to do it again. She'd like a callback if and when possible. Please advise.

## 2023-03-14 ENCOUNTER — Other Ambulatory Visit: Payer: Self-pay | Admitting: Primary Care

## 2023-03-14 DIAGNOSIS — K5900 Constipation, unspecified: Secondary | ICD-10-CM

## 2023-03-15 ENCOUNTER — Telehealth: Payer: Self-pay

## 2023-03-15 NOTE — Telephone Encounter (Signed)
Received fax from [facility] requesting cough medicine and a chest xray for patient due to a bad cough.  Need to get more information.   Called and spoke with patients daughter she stated when she visited with the patient on Sunday, she did not have a cough. Patients daughter mentioned that every year during season changes her allergies act up and she gets a cough from that so it may be from that. Patients daughter did state that she would be agreeable to the patient receiving delsym or robitussin.   I advised I will call [facility] to get some more information. Left voicemail for Aaliyah with [facility] to return call to office.

## 2023-03-16 NOTE — Telephone Encounter (Signed)
This all really needs an office visit. I can get a urine test and chest xray in our office. Can they bring her in?

## 2023-03-16 NOTE — Telephone Encounter (Signed)
[name] returned call back. Would like a call back  [phone]   She also wanted to report that patients weight is declining. In January she was at 166,she is now at 147.

## 2023-03-16 NOTE — Telephone Encounter (Signed)
Called and spoke with Aaliyah at [facility], she states Monday 03/14/23 is the first time the cough was brought to her attention. The nurses have described it as a dry deep cough, coughing so hard the floor would shake if standing next to her. Achille Rich stated the patient does have delysm on her med cart at the facility, they just need an order to start giving that to her to see if this helps. If she needs something else, they will have to have this delivered from the pharmacy and that will take time. They are also requesting chest x-ray for patient.   Achille Rich states that the patient is not eating a lot, in Jan she was 166 lb, she is now at 147 lb. she often refuses to go down to the dining room and she nibbles in her room. When they have asked her what would help her be motivated to eat she states she doesn't know, and that her head feels really weird and confused. Achille Rich is requesting order for UA and culture to rule out UTI.

## 2023-03-16 NOTE — Telephone Encounter (Signed)
Called and spoke with patients daughter [name], scheduled patient appt 03/17/23 @ 11 am.   Called and notified [facility] and advised Jae Dire approved the order to give her Robitussin/Delsym cough syrup. Also advised we will address all of the concerns at office visit tomorrow. Achille Rich stated she will have patient ppw and patient ready for appt.   Called and notified patients daughter of this.

## 2023-03-17 ENCOUNTER — Ambulatory Visit (INDEPENDENT_AMBULATORY_CARE_PROVIDER_SITE_OTHER)
Admission: RE | Admit: 2023-03-17 | Discharge: 2023-03-17 | Disposition: A | Discharge status: Home / Self Care | Payer: Medicare Other | Source: Ambulatory Visit | Attending: Primary Care | Admitting: Primary Care

## 2023-03-17 ENCOUNTER — Ambulatory Visit (INDEPENDENT_AMBULATORY_CARE_PROVIDER_SITE_OTHER): Payer: Medicare Other | Admitting: Primary Care

## 2023-03-17 ENCOUNTER — Encounter: Payer: Self-pay | Admitting: Primary Care

## 2023-03-17 VITALS — BP 116/78 | HR 75 | Temp 97.5°F | Ht 69.0 in | Wt 145.0 lb

## 2023-03-17 DIAGNOSIS — R051 Acute cough: Secondary | ICD-10-CM | POA: Diagnosis not present

## 2023-03-17 DIAGNOSIS — F03B0 Unspecified dementia, moderate, without behavioral disturbance, psychotic disturbance, mood disturbance, and anxiety: Secondary | ICD-10-CM | POA: Diagnosis not present

## 2023-03-17 DIAGNOSIS — R229 Localized swelling, mass and lump, unspecified: Secondary | ICD-10-CM | POA: Diagnosis not present

## 2023-03-17 DIAGNOSIS — R059 Cough, unspecified: Secondary | ICD-10-CM | POA: Diagnosis not present

## 2023-03-17 DIAGNOSIS — J9811 Atelectasis: Secondary | ICD-10-CM | POA: Diagnosis not present

## 2023-03-17 LAB — POC URINALSYSI DIPSTICK (AUTOMATED)
Bilirubin, UA: NEGATIVE
Blood, UA: NEGATIVE
Glucose, UA: NEGATIVE
Ketones, UA: NEGATIVE
Leukocytes, UA: NEGATIVE
Nitrite, UA: NEGATIVE
Protein, UA: NEGATIVE
Spec Grav, UA: 1.01 (ref 1.010–1.025)
Urobilinogen, UA: 0.2 E.U./dL — AB
pH, UA: 7 (ref 5.0–8.0)

## 2023-03-17 NOTE — Telephone Encounter (Signed)
Noted  

## 2023-03-17 NOTE — Assessment & Plan Note (Signed)
Symptoms more representative of allergies based on HPI and exam. Exam benign.   She appears well and at her baseline, her daughter agrees.  Checking chest xray today to be sure. Especially as she resides in a nursing facility. Await results.

## 2023-03-17 NOTE — Progress Notes (Signed)
Subjective:    Patient ID: [name], female    DOB: 02-Jul-1934, 89 y.o.   MRN: [id number]  Cough Associated symptoms include postnasal drip and rhinorrhea. Pertinent negatives include no chest pain, chills, fever, shortness of breath or wheezing.  Altered Mental Status Associated symptoms include congestion and coughing. Pertinent negatives include no chest pain, chills, fatigue or fever.    [name] is a very pleasant 89 y.o. female with a history of asthma, dementia, CVA, AAA, CAD, CKD, close left hip fracture who presents today to discuss cough.  Her daughter is with her today who helps provide information for HPI.  We were contacted by her nursing home facility with reports of a "dry deep cough" since at least 03/14/23, with weight loss of 21 pounds since January 2024, reduced appetite. [facility] requested chest xray and UA. She is here today for evaluation.   Her daughter mentions that the patient has been overall well. They were visiting her four days ago and she had no symptoms. Her daughter has noticed her cough twice today but suspects her cough is from allergies and post nasal drip as she typically gets these symptoms during seasonal changes. This morning she coughed up some clear sputum while in the shower.   Her daughter knows that she is eating as she eats the snacks that are brought by her family. She is also going to the dining room to eat meals.   The patient feels well today. She denies fevers, chills, fatigue, shortness of breath. Her daughter denies acute confusion.   Her mother was slightly disoriented this morning when she arrived. The patient was still in bed so they had to rush her to get ready for her appointment today. Because of this she thinks she was more confused, otherwise there have been no changes.   Her daughter noticed a soft tissue mass to the posterior left upper extremity. Her mother has not noticed or mentioned any  discomfort.   Wt Readings from Last 3 Encounters:  03/17/23 145 lb (65.8 kg)  12/27/22 148 lb 3.2 oz (67.2 kg)  11/26/22 152 lb (68.9 kg)      Review of Systems  Constitutional:  Negative for chills, fatigue and fever.  HENT:  Positive for congestion, postnasal drip and rhinorrhea.   Respiratory:  Positive for cough. Negative for shortness of breath and wheezing.   Cardiovascular:  Negative for chest pain.  Genitourinary:  Negative for dysuria.         Past Medical History:  Diagnosis Date   AAA (abdominal aortic aneurysm) (HCC) 01/03/13   3.5x3.3cm   Alzheimer's dementia (HCC)    "probably middle stage" (11/05/2014)   Anemia    "hx of chronic" (11/05/2014)   Aortic stenosis 03/29/2008   biological prosthetic replacement   Arthritis    "joints" (11/05/2014)   Asthma    Avascular necrosis of bone of hip, right (HCC) 07/28/2018   Chronic asthmatic bronchitis    "q fall and winter" (11/05/2014)   Closed nondisplaced fracture of middle phalanx of lesser toe of right foot 01/05/2021   Coronary artery disease    CABG 03/29/08   Dyslipidemia    Exertional shortness of breath    Family history of adverse reaction to anesthesia    "daughter gets PONV too"   Heart murmur    Hypertension    Hypothyroidism    LBBB (left bundle branch block)    Migraines    "none in  years' (11/05/2014)   Mobitz type 2 second degree AV block 10/31/2014   MVP (mitral valve prolapse)    with mild mitral insufficiency/notes 08/17/2013   Near syncope 08/17/2013   PONV (postoperative nausea and vomiting)    Presence of permanent cardiac pacemaker    Sinus pause 10/31/2014   Stroke (HCC) 03/2008   "2 light strokes"; denies residual on 11/05/2014   Urinary frequency     Social History   Socioeconomic History   Marital status: Divorced    Spouse name: Not on file   Number of children: 1   Years of education: Some college   Highest education level: Not on file  Occupational History   Occupation: Retired   Tobacco Use   Smoking status: Never   Smokeless tobacco: Never  Vaping Use   Vaping Use: Never used  Substance and Sexual Activity   Alcohol use: No    Alcohol/week: 0.0 standard drinks of alcohol   Drug use: No   Sexual activity: Not Currently  Other Topics Concern   Not on file  Social History Narrative   Pt lives in single story home with her daughter and son-in-law   Has 1 child   Some college education   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 [employer]    Social Determinants of Health   Financial Resource Strain: Low Risk  (06/26/2019)   Overall Financial Resource Strain (CARDIA)    Difficulty of Paying Living Expenses: Not hard at all  Food Insecurity: No Food Insecurity (01/14/2023)   Hunger Vital Sign    Worried About Running Out of Food in the Last Year: Never true    Ran Out of Food in the Last Year: Never true  Transportation Needs: No Transportation Needs (01/14/2023)   PRAPARE - Administrator, Civil Service (Medical): No    Lack of Transportation (Non-Medical): No  Physical Activity: Inactive (06/26/2019)   Exercise Vital Sign    Days of Exercise per Week: 0 days    Minutes of Exercise per Session: 0 min  Stress: No Stress Concern Present (06/26/2019)   [employer] of Occupational Health - Occupational Stress Questionnaire    Feeling of Stress : Not at all  Social Connections: Not on file  Intimate Partner Violence: Not At Risk (10/30/2022)   Humiliation, Afraid, Rape, and Kick questionnaire    Fear of Current or Ex-Partner: No    Emotionally Abused: No    Physically Abused: No    Sexually Abused: No    Past Surgical History:  Procedure Laterality Date   ABDOMINAL HYSTERECTOMY  1977   AORTIC VALVE REPLACEMENT  03/29/2008   PERICARDIAL TISSUE VALVE   APPENDECTOMY  ?1977   CARDIAC CATHETERIZATION  ~ 2009   CATARACT EXTRACTION W/ INTRAOCULAR LENS IMPLANT Left    CORONARY ARTERY BYPASS GRAFT  03/29/2008   LIMA TO LAD,SVG TO  CX,SVG TO LEFT POSTEROLATERAL BRANCH   HIP FRACTURE SURGERY Right 06/2010   "3 metal screws"    INSERT / REPLACE / REMOVE PACEMAKER  11/05/2014   LOOP RECORDER EXPLANT N/A 11/05/2014   Procedure: LOOP RECORDER EXPLANT;  Surgeon: [name], MD;  Location: [facility];  Service: Cardiovascular;  Laterality: N/A;   LOOP RECORDER IMPLANT N/A 09/17/2013   Procedure: LOOP RECORDER IMPLANT;  Surgeon: [name], MD;  Location: [facility];  Service: Cardiovascular;  Laterality: N/A;   PERMANENT PACEMAKER INSERTION N/A 11/05/2014   Procedure: PERMANENT PACEMAKER INSERTION;  Surgeon: Rachelle Hora Croitoru,  MD;  Location: [facility];  Service: Cardiovascular;  Laterality: N/A;   REFRACTIVE SURGERY Bilateral    /notes 04/28/2008 (08/17/2013)   RETINAL DETACHMENT SURGERY Left 1994   [name] 04/10/2008 (08/17/2013)   TIBIAL TUBERCLERPLASTY  11/05/2014   tooth pulled   06/2018   TOTAL HIP ARTHROPLASTY Right 07/31/2018   Procedure: RIGHT TOTAL HIP ARTHROPLASTY ANTERIOR APPROACH;  Surgeon: Gean Birchwood, MD;  Location: [facility];  Service: Orthopedics;  Laterality: Right;   TOTAL HIP ARTHROPLASTY Left 10/31/2022   Procedure: TOTAL HIP ARTHROPLASTY ANTERIOR APPROACH;  Surgeon: Durene Romans, MD;  Location: [facility] OR;  Service: Orthopedics;  Laterality: Left;   UTERINE FIBROID SURGERY  1960's    Family History  Problem Relation Age of Onset   Heart failure Mother    Diabetes Mother    Hypertension Mother    Hyperlipidemia Mother    Melanoma Grandchild    Colon cancer Neg Hx     Allergies  Allergen Reactions   Codeine Nausea And Vomiting and Other (See Comments)    Patient gets violently ill   Morphine And Codeine Nausea And Vomiting and Other (See Comments)    Patient get violently ill    Current Outpatient Medications on File Prior to Visit  Medication Sig Dispense Refill   alendronate (FOSAMAX) 70 MG tablet TAKE 1 TABLET BY MOUTH ONCE A WEEK ON SUNDAYS. NO FOOD OR OTHER MEDICATIONS FOR 30 MIN. AVOID LAYING  FLAT FOR 2 HOURS. 12 tablet 1   aspirin EC (ASPIRIN LOW DOSE) 81 MG tablet Take 81 mg twice daily for 4 weeks and then once daily as before 90 tablet 3   atorvastatin (LIPITOR) 40 MG tablet TAKE 1 TABLET BY MOUTH DAILY FOR CHOLESTEROL (Patient taking differently: Take 40 mg by mouth daily.) 90 tablet 2   brimonidine (ALPHAGAN) 0.2 % ophthalmic solution Place 1 drop into the left eye 2 (two) times daily.  3   Calcium Carbonate-Vit D-Min (CALTRATE 600+D PLUS MINERALS) 600-800 MG-UNIT TABS Take 1 tablet by mouth twice daily for calcium and vitamin D. 180 tablet 3   docusate sodium (COLACE) 100 MG capsule TAKE 1 CAPSULE BY MOUTH TWICE A DAY AS NEEDED FOR MILD CONSTIPATION 180 capsule 0   fluticasone (FLONASE) 50 MCG/ACT nasal spray PLACE 1 SPRAY INTO BOTH NOSTRILS DAILY 48 g 3   fluticasone-salmeterol (ADVAIR HFA) 45-21 MCG/ACT inhaler INHALE 1 PUFF INTO THE LUNGS TWICE A DAY 12 g 5   furosemide (LASIX) 20 MG tablet Take 1 tablet (20 mg total) by mouth daily. 90 tablet 3   levothyroxine (SYNTHROID) 50 MCG tablet TAKE 1 TABLET BY MOUTH EVERY MORNING ON AN EMPTY STOMACH WITH WATER ONLY. NO FOOD OR OTHER MEDICATIONS FOR 30 MINS. (Patient taking differently: Take 50 mcg by mouth daily before breakfast. TAKE 1 TABLET BY MOUTH EVERY MORNING ON AN EMPTY STOMACH WITH WATER ONLY. NO FOOD OR OTHER MEDICATIONS FOR 30 MINS.) 90 tablet 3   losartan (COZAAR) 50 MG tablet Take 1 tablet (50 mg total) by mouth daily. for blood pressure 90 tablet 3   potassium chloride (KLOR-CON M) 10 MEQ tablet 2 tablets by mouth once a day 180 tablet 3   amoxicillin (AMOXIL) 500 MG tablet Take 2,000 mg by mouth See admin instructions. Take 2,000mg  by mouth 1 hour prior to dental appointments (Patient not taking: Reported on 12/27/2022)  1   No current facility-administered medications on file prior to visit.    BP 116/78   Pulse 75   Temp Marland Kitchen)  97.5 F (36.4 C) (Temporal)   Ht 5\' 9"  (1.753 m)   Wt 145 lb (65.8 kg)   SpO2 98%   BMI  21.41 kg/m  Objective:   Physical Exam Constitutional:      General: She is not in acute distress.    Appearance: She is not ill-appearing.  Cardiovascular:     Rate and Rhythm: Normal rate.  Pulmonary:     Effort: Pulmonary effort is normal.     Breath sounds: Normal breath sounds. No wheezing or rhonchi.     Comments: No cough noted during visit Skin:    General: Skin is warm and dry.     Findings: No erythema.     Comments: 5 X 4 soft tissue, mobile, mass to left upper posterior upper extremity. Non tender.   Neurological:     Mental Status: She is alert.     Comments: Follows commands per baseline.           Assessment & Plan:  Acute cough Assessment & Plan: Symptoms more representative of allergies based on HPI and exam. Exam benign.   She appears well and at her baseline, her daughter agrees.  Checking chest xray today to be sure. Especially as she resides in a nursing facility. Await results.   Orders: -     DG Chest 2 View  Localized skin mass, lump, or swelling Assessment & Plan: Exam today consistent for cyst or lipoma, discussed with patient and daughter. Offered soft tissue ultrasound for which she and her daughter kindly declined.   Okay to watch and wait.  Daughter will update if anything changes.          Doreene Nest, NP

## 2023-03-17 NOTE — Addendum Note (Signed)
Addended by: Lonia Blood on: 03/17/2023 12:55 PM   Modules accepted: Orders

## 2023-03-17 NOTE — Assessment & Plan Note (Signed)
Exam today consistent for cyst or lipoma, discussed with patient and daughter. Offered soft tissue ultrasound for which she and her daughter kindly declined.   Okay to watch and wait.  Daughter will update if anything changes.

## 2023-03-17 NOTE — Patient Instructions (Signed)
Complete xray(s) prior to leaving today. I will notify you of your results once received.  Complete your urine test at the nursing home.  It was a pleasure to see you today!

## 2023-03-28 NOTE — Progress Notes (Signed)
Remote pacemaker transmission.   

## 2023-04-20 ENCOUNTER — Telehealth: Payer: Self-pay | Admitting: Primary Care

## 2023-04-20 NOTE — Telephone Encounter (Signed)
[name] from [facility] [place] called the office inquiring on [place] forms faxed for this patient. Stated that it had been sent on either Friday of last week or Monday this week. Caller was following up on this, informed her that I did not see form in Kate's s drive folder so it was likely being completed by her now. Caller said they were needing the forms completed because patient is being moved to memory care. Please advise, thank you.

## 2023-04-20 NOTE — Telephone Encounter (Signed)
Form completed and placed in Kelli's inbox 

## 2023-04-20 NOTE — Telephone Encounter (Signed)
Form has been faxed to facility.

## 2023-05-03 ENCOUNTER — Other Ambulatory Visit: Payer: Self-pay | Admitting: Pharmacist

## 2023-05-03 NOTE — Progress Notes (Signed)
Pharmacy Quality Measure Review  This patient is appearing on a report for being at risk of failing the adherence measure for cholesterol (statin) and hypertension (ACEi/ARB) medications this calendar year.   Medication: losartan 50 mg  Last fill date: 03/22/23 for 30 day supply    Medication: atorvastatin 40 mg  Last fill date: 03/22/23 for 30 day supply  Per chart review, patient being moved to memory care. Will not engage at this time.   Catie Eppie Gibson, PharmD, BCACP, CPP Clinical Pharmacist [facility] [phone]

## 2023-05-04 DIAGNOSIS — D631 Anemia in chronic kidney disease: Secondary | ICD-10-CM | POA: Diagnosis not present

## 2023-05-04 DIAGNOSIS — N189 Chronic kidney disease, unspecified: Secondary | ICD-10-CM | POA: Diagnosis not present

## 2023-05-04 DIAGNOSIS — Z950 Presence of cardiac pacemaker: Secondary | ICD-10-CM | POA: Diagnosis not present

## 2023-05-04 DIAGNOSIS — E039 Hypothyroidism, unspecified: Secondary | ICD-10-CM | POA: Diagnosis not present

## 2023-05-04 DIAGNOSIS — Z8673 Personal history of transient ischemic attack (TIA), and cerebral infarction without residual deficits: Secondary | ICD-10-CM | POA: Diagnosis not present

## 2023-05-04 DIAGNOSIS — M879 Osteonecrosis, unspecified: Secondary | ICD-10-CM | POA: Diagnosis not present

## 2023-05-04 DIAGNOSIS — J4489 Other specified chronic obstructive pulmonary disease: Secondary | ICD-10-CM | POA: Diagnosis not present

## 2023-05-04 DIAGNOSIS — E785 Hyperlipidemia, unspecified: Secondary | ICD-10-CM | POA: Diagnosis not present

## 2023-05-04 DIAGNOSIS — Z96643 Presence of artificial hip joint, bilateral: Secondary | ICD-10-CM | POA: Diagnosis not present

## 2023-05-04 DIAGNOSIS — I2510 Atherosclerotic heart disease of native coronary artery without angina pectoris: Secondary | ICD-10-CM | POA: Diagnosis not present

## 2023-05-04 DIAGNOSIS — Z952 Presence of prosthetic heart valve: Secondary | ICD-10-CM | POA: Diagnosis not present

## 2023-05-04 DIAGNOSIS — I714 Abdominal aortic aneurysm, without rupture, unspecified: Secondary | ICD-10-CM | POA: Diagnosis not present

## 2023-05-04 DIAGNOSIS — I442 Atrioventricular block, complete: Secondary | ICD-10-CM | POA: Diagnosis not present

## 2023-05-04 DIAGNOSIS — M1990 Unspecified osteoarthritis, unspecified site: Secondary | ICD-10-CM | POA: Diagnosis not present

## 2023-05-04 DIAGNOSIS — Z79899 Other long term (current) drug therapy: Secondary | ICD-10-CM | POA: Diagnosis not present

## 2023-05-04 DIAGNOSIS — Z7982 Long term (current) use of aspirin: Secondary | ICD-10-CM | POA: Diagnosis not present

## 2023-05-04 DIAGNOSIS — Z9181 History of falling: Secondary | ICD-10-CM | POA: Diagnosis not present

## 2023-05-04 DIAGNOSIS — I447 Left bundle-branch block, unspecified: Secondary | ICD-10-CM | POA: Diagnosis not present

## 2023-05-04 DIAGNOSIS — G43909 Migraine, unspecified, not intractable, without status migrainosus: Secondary | ICD-10-CM | POA: Diagnosis not present

## 2023-05-04 DIAGNOSIS — F02818 Dementia in other diseases classified elsewhere, unspecified severity, with other behavioral disturbance: Secondary | ICD-10-CM | POA: Diagnosis not present

## 2023-05-04 DIAGNOSIS — I1310 Hypertensive heart and chronic kidney disease without heart failure, with stage 1 through stage 4 chronic kidney disease, or unspecified chronic kidney disease: Secondary | ICD-10-CM | POA: Diagnosis not present

## 2023-05-04 DIAGNOSIS — G309 Alzheimer's disease, unspecified: Secondary | ICD-10-CM | POA: Diagnosis not present

## 2023-05-09 DIAGNOSIS — N189 Chronic kidney disease, unspecified: Secondary | ICD-10-CM | POA: Diagnosis not present

## 2023-05-09 DIAGNOSIS — G309 Alzheimer's disease, unspecified: Secondary | ICD-10-CM | POA: Diagnosis not present

## 2023-05-09 DIAGNOSIS — M1990 Unspecified osteoarthritis, unspecified site: Secondary | ICD-10-CM | POA: Diagnosis not present

## 2023-05-09 DIAGNOSIS — I1310 Hypertensive heart and chronic kidney disease without heart failure, with stage 1 through stage 4 chronic kidney disease, or unspecified chronic kidney disease: Secondary | ICD-10-CM | POA: Diagnosis not present

## 2023-05-09 DIAGNOSIS — I2510 Atherosclerotic heart disease of native coronary artery without angina pectoris: Secondary | ICD-10-CM | POA: Diagnosis not present

## 2023-05-09 DIAGNOSIS — Z950 Presence of cardiac pacemaker: Secondary | ICD-10-CM | POA: Diagnosis not present

## 2023-05-09 DIAGNOSIS — J4489 Other specified chronic obstructive pulmonary disease: Secondary | ICD-10-CM | POA: Diagnosis not present

## 2023-05-09 DIAGNOSIS — I447 Left bundle-branch block, unspecified: Secondary | ICD-10-CM | POA: Diagnosis not present

## 2023-05-09 DIAGNOSIS — G43909 Migraine, unspecified, not intractable, without status migrainosus: Secondary | ICD-10-CM | POA: Diagnosis not present

## 2023-05-09 DIAGNOSIS — E039 Hypothyroidism, unspecified: Secondary | ICD-10-CM | POA: Diagnosis not present

## 2023-05-09 DIAGNOSIS — F02818 Dementia in other diseases classified elsewhere, unspecified severity, with other behavioral disturbance: Secondary | ICD-10-CM | POA: Diagnosis not present

## 2023-05-09 DIAGNOSIS — I714 Abdominal aortic aneurysm, without rupture, unspecified: Secondary | ICD-10-CM | POA: Diagnosis not present

## 2023-05-09 DIAGNOSIS — Z96643 Presence of artificial hip joint, bilateral: Secondary | ICD-10-CM | POA: Diagnosis not present

## 2023-05-09 DIAGNOSIS — I442 Atrioventricular block, complete: Secondary | ICD-10-CM | POA: Diagnosis not present

## 2023-05-09 DIAGNOSIS — Z79899 Other long term (current) drug therapy: Secondary | ICD-10-CM | POA: Diagnosis not present

## 2023-05-09 DIAGNOSIS — D631 Anemia in chronic kidney disease: Secondary | ICD-10-CM | POA: Diagnosis not present

## 2023-05-09 DIAGNOSIS — Z952 Presence of prosthetic heart valve: Secondary | ICD-10-CM | POA: Diagnosis not present

## 2023-05-09 DIAGNOSIS — M879 Osteonecrosis, unspecified: Secondary | ICD-10-CM | POA: Diagnosis not present

## 2023-05-09 DIAGNOSIS — Z9181 History of falling: Secondary | ICD-10-CM | POA: Diagnosis not present

## 2023-05-09 DIAGNOSIS — Z7982 Long term (current) use of aspirin: Secondary | ICD-10-CM | POA: Diagnosis not present

## 2023-05-09 DIAGNOSIS — Z8673 Personal history of transient ischemic attack (TIA), and cerebral infarction without residual deficits: Secondary | ICD-10-CM | POA: Diagnosis not present

## 2023-05-09 DIAGNOSIS — E785 Hyperlipidemia, unspecified: Secondary | ICD-10-CM | POA: Diagnosis not present

## 2023-05-10 ENCOUNTER — Telehealth: Payer: Self-pay | Admitting: Primary Care

## 2023-05-10 NOTE — Telephone Encounter (Signed)
Called number not identified as secured voicemail. Left generic message to call our office.

## 2023-05-10 NOTE — Telephone Encounter (Signed)
 Approved.  

## 2023-05-10 NOTE — Telephone Encounter (Signed)
Home Health verbal orders Caller Name:CONNIE Agency Name: [facility]  Callback number: [phone]  Requesting OT/PT/Skilled nursing/Social Work/Speech:OT   Reason:RECENT FALL  Frequency:1 WK 5  Please forward to [facility] pool or providers CMA

## 2023-05-12 DIAGNOSIS — I714 Abdominal aortic aneurysm, without rupture, unspecified: Secondary | ICD-10-CM | POA: Diagnosis not present

## 2023-05-12 DIAGNOSIS — M1990 Unspecified osteoarthritis, unspecified site: Secondary | ICD-10-CM | POA: Diagnosis not present

## 2023-05-12 DIAGNOSIS — J4489 Other specified chronic obstructive pulmonary disease: Secondary | ICD-10-CM | POA: Diagnosis not present

## 2023-05-12 DIAGNOSIS — G309 Alzheimer's disease, unspecified: Secondary | ICD-10-CM | POA: Diagnosis not present

## 2023-05-12 DIAGNOSIS — F02818 Dementia in other diseases classified elsewhere, unspecified severity, with other behavioral disturbance: Secondary | ICD-10-CM | POA: Diagnosis not present

## 2023-05-12 DIAGNOSIS — Z96643 Presence of artificial hip joint, bilateral: Secondary | ICD-10-CM | POA: Diagnosis not present

## 2023-05-12 DIAGNOSIS — Z9181 History of falling: Secondary | ICD-10-CM | POA: Diagnosis not present

## 2023-05-12 DIAGNOSIS — I447 Left bundle-branch block, unspecified: Secondary | ICD-10-CM | POA: Diagnosis not present

## 2023-05-12 DIAGNOSIS — D631 Anemia in chronic kidney disease: Secondary | ICD-10-CM | POA: Diagnosis not present

## 2023-05-12 DIAGNOSIS — Z79899 Other long term (current) drug therapy: Secondary | ICD-10-CM | POA: Diagnosis not present

## 2023-05-12 DIAGNOSIS — Z950 Presence of cardiac pacemaker: Secondary | ICD-10-CM | POA: Diagnosis not present

## 2023-05-12 DIAGNOSIS — I2510 Atherosclerotic heart disease of native coronary artery without angina pectoris: Secondary | ICD-10-CM | POA: Diagnosis not present

## 2023-05-12 DIAGNOSIS — Z8673 Personal history of transient ischemic attack (TIA), and cerebral infarction without residual deficits: Secondary | ICD-10-CM | POA: Diagnosis not present

## 2023-05-12 DIAGNOSIS — E785 Hyperlipidemia, unspecified: Secondary | ICD-10-CM | POA: Diagnosis not present

## 2023-05-12 DIAGNOSIS — I1310 Hypertensive heart and chronic kidney disease without heart failure, with stage 1 through stage 4 chronic kidney disease, or unspecified chronic kidney disease: Secondary | ICD-10-CM | POA: Diagnosis not present

## 2023-05-12 DIAGNOSIS — Z7982 Long term (current) use of aspirin: Secondary | ICD-10-CM | POA: Diagnosis not present

## 2023-05-12 DIAGNOSIS — M879 Osteonecrosis, unspecified: Secondary | ICD-10-CM | POA: Diagnosis not present

## 2023-05-12 DIAGNOSIS — Z952 Presence of prosthetic heart valve: Secondary | ICD-10-CM | POA: Diagnosis not present

## 2023-05-12 DIAGNOSIS — I442 Atrioventricular block, complete: Secondary | ICD-10-CM | POA: Diagnosis not present

## 2023-05-12 DIAGNOSIS — N189 Chronic kidney disease, unspecified: Secondary | ICD-10-CM | POA: Diagnosis not present

## 2023-05-12 DIAGNOSIS — G43909 Migraine, unspecified, not intractable, without status migrainosus: Secondary | ICD-10-CM | POA: Diagnosis not present

## 2023-05-12 DIAGNOSIS — E039 Hypothyroidism, unspecified: Secondary | ICD-10-CM | POA: Diagnosis not present

## 2023-05-12 NOTE — Telephone Encounter (Signed)
Called number voice mail was identified and secured message left with verbal orders and to call if any questions.

## 2023-05-19 DIAGNOSIS — D631 Anemia in chronic kidney disease: Secondary | ICD-10-CM | POA: Diagnosis not present

## 2023-05-19 DIAGNOSIS — I714 Abdominal aortic aneurysm, without rupture, unspecified: Secondary | ICD-10-CM | POA: Diagnosis not present

## 2023-05-19 DIAGNOSIS — Z8673 Personal history of transient ischemic attack (TIA), and cerebral infarction without residual deficits: Secondary | ICD-10-CM | POA: Diagnosis not present

## 2023-05-19 DIAGNOSIS — F02818 Dementia in other diseases classified elsewhere, unspecified severity, with other behavioral disturbance: Secondary | ICD-10-CM | POA: Diagnosis not present

## 2023-05-19 DIAGNOSIS — Z950 Presence of cardiac pacemaker: Secondary | ICD-10-CM | POA: Diagnosis not present

## 2023-05-19 DIAGNOSIS — N189 Chronic kidney disease, unspecified: Secondary | ICD-10-CM | POA: Diagnosis not present

## 2023-05-19 DIAGNOSIS — Z7982 Long term (current) use of aspirin: Secondary | ICD-10-CM | POA: Diagnosis not present

## 2023-05-19 DIAGNOSIS — I2510 Atherosclerotic heart disease of native coronary artery without angina pectoris: Secondary | ICD-10-CM | POA: Diagnosis not present

## 2023-05-19 DIAGNOSIS — I1310 Hypertensive heart and chronic kidney disease without heart failure, with stage 1 through stage 4 chronic kidney disease, or unspecified chronic kidney disease: Secondary | ICD-10-CM | POA: Diagnosis not present

## 2023-05-19 DIAGNOSIS — G309 Alzheimer's disease, unspecified: Secondary | ICD-10-CM | POA: Diagnosis not present

## 2023-05-19 DIAGNOSIS — Z9181 History of falling: Secondary | ICD-10-CM | POA: Diagnosis not present

## 2023-05-19 DIAGNOSIS — E039 Hypothyroidism, unspecified: Secondary | ICD-10-CM | POA: Diagnosis not present

## 2023-05-19 DIAGNOSIS — Z952 Presence of prosthetic heart valve: Secondary | ICD-10-CM | POA: Diagnosis not present

## 2023-05-19 DIAGNOSIS — Z96643 Presence of artificial hip joint, bilateral: Secondary | ICD-10-CM | POA: Diagnosis not present

## 2023-05-19 DIAGNOSIS — E785 Hyperlipidemia, unspecified: Secondary | ICD-10-CM | POA: Diagnosis not present

## 2023-05-19 DIAGNOSIS — I447 Left bundle-branch block, unspecified: Secondary | ICD-10-CM | POA: Diagnosis not present

## 2023-05-19 DIAGNOSIS — J4489 Other specified chronic obstructive pulmonary disease: Secondary | ICD-10-CM | POA: Diagnosis not present

## 2023-05-19 DIAGNOSIS — Z79899 Other long term (current) drug therapy: Secondary | ICD-10-CM | POA: Diagnosis not present

## 2023-05-19 DIAGNOSIS — M879 Osteonecrosis, unspecified: Secondary | ICD-10-CM | POA: Diagnosis not present

## 2023-05-19 DIAGNOSIS — M1990 Unspecified osteoarthritis, unspecified site: Secondary | ICD-10-CM | POA: Diagnosis not present

## 2023-05-19 DIAGNOSIS — G43909 Migraine, unspecified, not intractable, without status migrainosus: Secondary | ICD-10-CM | POA: Diagnosis not present

## 2023-05-19 DIAGNOSIS — I442 Atrioventricular block, complete: Secondary | ICD-10-CM | POA: Diagnosis not present

## 2023-05-20 ENCOUNTER — Telehealth: Payer: Self-pay | Admitting: Primary Care

## 2023-05-20 NOTE — Telephone Encounter (Signed)
Approved.  

## 2023-05-20 NOTE — Telephone Encounter (Signed)
Noted.  This would have been helpful to mention when she called in. Prescription for home health wound care placed in [facility] inbox.

## 2023-05-20 NOTE — Telephone Encounter (Signed)
[name] from [facility] [place] called to report that patient has a skin tear on her left arm,she would need an order for [facility] to follow for wound care.

## 2023-05-20 NOTE — Telephone Encounter (Signed)
Called and spoke to [place] states needs orders faxed to [facility] at [place]. She is not able to take verbal orders.

## 2023-05-23 NOTE — Telephone Encounter (Signed)
Written Rx faxed to [facility] and received confirmation fax.

## 2023-05-25 ENCOUNTER — Emergency Department
Admission: EM | Admit: 2023-05-25 | Discharge: 2023-05-25 | Disposition: A | Discharge status: Home / Self Care | Payer: Medicare Other | Attending: Emergency Medicine | Admitting: Emergency Medicine

## 2023-05-25 ENCOUNTER — Emergency Department: Payer: Medicare Other

## 2023-05-25 DIAGNOSIS — F0390 Unspecified dementia without behavioral disturbance: Secondary | ICD-10-CM | POA: Diagnosis not present

## 2023-05-25 DIAGNOSIS — S0083XA Contusion of other part of head, initial encounter: Secondary | ICD-10-CM

## 2023-05-25 DIAGNOSIS — S0990XA Unspecified injury of head, initial encounter: Secondary | ICD-10-CM

## 2023-05-25 DIAGNOSIS — W19XXXA Unspecified fall, initial encounter: Secondary | ICD-10-CM | POA: Diagnosis not present

## 2023-05-25 DIAGNOSIS — S01112A Laceration without foreign body of left eyelid and periocular area, initial encounter: Secondary | ICD-10-CM | POA: Diagnosis not present

## 2023-05-25 DIAGNOSIS — S80212A Abrasion, left knee, initial encounter: Secondary | ICD-10-CM | POA: Diagnosis not present

## 2023-05-25 DIAGNOSIS — R0902 Hypoxemia: Secondary | ICD-10-CM | POA: Diagnosis not present

## 2023-05-25 DIAGNOSIS — Z743 Need for continuous supervision: Secondary | ICD-10-CM | POA: Diagnosis not present

## 2023-05-25 DIAGNOSIS — S0181XA Laceration without foreign body of other part of head, initial encounter: Secondary | ICD-10-CM | POA: Insufficient documentation

## 2023-05-25 DIAGNOSIS — N189 Chronic kidney disease, unspecified: Secondary | ICD-10-CM | POA: Insufficient documentation

## 2023-05-25 NOTE — ED Provider Notes (Signed)
[facility] Provider Note    Event Date/Time   First MD Initiated Contact with Patient 05/25/23 1949     (approximate)   History   Fall   HPI  [name] is a 89 y.o. female   with a history of dementia who lives at memory care  Patient does have a history of multiple medical conditions due to coronary disease mitral valve prolapse, paroxysmal atrial tachycardia chronic kidney disease encephalopathy  She has been in her normal baseline.  It is believed that she fell near her nightstand.  Her daughter is here and reports they do not know exactly what happened but she was found with a small laceration next to her eyebrow.  Also a small abrasion to her left knee.  Her mother is otherwise acting behaving normally.  Daughter informs me that the patient does have a DO NOT RESUSCITATE      Physical Exam   Triage Vital Signs: ED Triage Vitals  Encounter Vitals Group     BP 05/25/23 1927 (!) 147/83     Systolic BP Percentile --      Diastolic BP Percentile --      Pulse Rate 05/25/23 1927 83     Resp 05/25/23 1927 14     Temp 05/25/23 1927 98.3 F (36.8 C)     Temp Source 05/25/23 1927 Oral     SpO2 05/25/23 1925 97 %     Weight --      Height --      Head Circumference --      Peak Flow --      Pain Score --      Pain Loc --      Pain Education --      Exclude from Growth Chart --     Most recent vital signs: Vitals:   05/25/23 1927 05/25/23 2220  BP: (!) 147/83 (!) 151/83  Pulse: 83 87  Resp: 14 18  Temp: 98.3 F (36.8 C) 98.3 F (36.8 C)  SpO2: 98% 99%   Of note, patient is not deemed to be a reliable historian  The patient does deny being in any pain or discomfort at this time.  General: Awake, no distress.  Patient recommends her daughter, she does not know what happened.  She states that she may be "walked into a door" CV:  Good peripheral perfusion.  Normal tones and rate Resp:  Normal effort.  Clear bilateral Abd:  No  distention.  Other:  Abdomen soft nontender nondistended.  She moves all extremities well she does have a small abrasion over the left anterior knee but demonstrates normal range of motion.  Additionally a small abrasion or skin tear is noted over the left forearm but patient Asase not to unwrap it as this actually occurred a couple days ago and was glued back in place previous  She demonstrates normal range of motion of all extremities.  She turns her head side-to-side reports no neck pain to movement or palpation along the cervical or thoracic processes  Normocephalic atraumatic set for a small approximately half centimeter very superficial laceration along the left lateral eyebrow   ED Results / Procedures / Treatments   Labs (all labs ordered are listed, but only abnormal results are displayed) Labs Reviewed - No data to display   EKG     RADIOLOGY  CT head interpreted by me as grossly negative for acute intracranial hemorrhage   CT Head Wo Contrast  Result Date: 05/25/2023 CLINICAL DATA:  Larey Seat, left supraorbital laceration, dementia EXAM: CT HEAD WITHOUT CONTRAST TECHNIQUE: Contiguous axial images were obtained from the base of the skull through the vertex without intravenous contrast. RADIATION DOSE REDUCTION: This exam was performed according to the departmental dose-optimization program which includes automated exposure control, adjustment of the mA and/or kV according to patient size and/or use of iterative reconstruction technique. COMPARISON:  10/29/2022 FINDINGS: Brain: Stable chronic small-vessel ischemic changes throughout the periventricular white matter. No evidence of acute infarct or hemorrhage. Lateral ventricles and remaining midline structures are unremarkable. No acute extra-axial fluid collections. No mass effect. Vascular: No hyperdense vessel or unexpected calcification. Skull: Normal. Negative for fracture or focal lesion. Sinuses/Orbits: No acute finding. Other:  None. IMPRESSION: 1. Stable head CT, no acute process. Electronically Signed   By: Sharlet Salina M.D.   On: 05/25/2023 22:10      PROCEDURES:  Critical Care performed: No  ..Laceration Repair  Date/Time: 05/28/2023 12:43 AM  Performed by: Sharyn Creamer, MD Authorized by: Sharyn Creamer, MD   Consent:    Consent obtained:  Verbal   Consent given by:  Guardian   Risks, benefits, and alternatives were discussed: yes     Risks discussed:  Infection, pain and poor cosmetic result   Alternatives discussed:  No treatment Universal protocol:    Procedure explained and questions answered to patient or proxy's satisfaction: yes     Imaging studies available: yes     Patient identity confirmation method: affirmed with guardian/hcpoa. Laceration details:    Location:  Face   Length (cm):  1   Depth (mm):  2 Exploration:    Limited defect created (wound extended): no     Wound extent: areolar tissue not violated and no foreign body     Contaminated: no   Treatment:    Amount of cleaning:  Standard   Irrigation solution:  Sterile saline   Visualized foreign bodies/material removed: no   Skin repair:    Repair method:  Tissue adhesive Approximation:    Approximation:  Close Repair type:    Repair type:  Simple Comments:     Peformed by PA student (Aimee) under my supervision    MEDICATIONS ORDERED IN ED: Medications - No data to display   IMPRESSION / MDM / ASSESSMENT AND PLAN / ED COURSE  I reviewed the triage vital signs and the nursing notes.                              Differential diagnosis includes, but is not limited to, skin laceration, evaluate for evidence of intracranial hemorrhage, mechanical fall, etc.  No preceding symptoms of recent illness.  Patient does have a history of falls.  Today it appears that she likely fell next to her nightstand and has suffered a small very superficial abrasion along the lateral edge of the left eyebrow.  Patient's presentation is  most consistent with acute complicated illness / injury requiring diagnostic workup.    Discussed with patient's care at the bedside, goal of care is for evaluation and repair of laceration.  Will obtain CT to evaluate for gross intracranial intermitted injury, but the patient is at her mental baseline and has had no preceding illness.  No indication to her need for medical workup at this time.   Laceration repaired with excellent effect.       FINAL CLINICAL IMPRESSION(S) / ED DIAGNOSES   Final diagnoses:  Contusion of face, initial encounter  Eyebrow laceration, left, initial encounter  Closed head injury, initial encounter     Rx / DC Orders   ED Discharge Orders     None        Note:  This document was prepared using Dragon voice recognition software and may include unintentional dictation errors.   Sharyn Creamer, MD 05/28/23 [phone]

## 2023-05-25 NOTE — ED Triage Notes (Signed)
Pt BIB [facility] for a fall with a lac to her L eyebrow. Unknown LOC. Patient demented at baseline and unable to recall events.

## 2023-05-25 NOTE — ED Notes (Signed)
Daughter at the bedside stating pt is a DNR but the form is not with her. Also stating she has a lac/skin tear to her L forearm that she would like to be evaluated.

## 2023-05-25 NOTE — ED Notes (Signed)
ED Provider at bedside. 

## 2023-05-26 DIAGNOSIS — N189 Chronic kidney disease, unspecified: Secondary | ICD-10-CM | POA: Diagnosis not present

## 2023-05-26 DIAGNOSIS — I714 Abdominal aortic aneurysm, without rupture, unspecified: Secondary | ICD-10-CM | POA: Diagnosis not present

## 2023-05-26 DIAGNOSIS — F02818 Dementia in other diseases classified elsewhere, unspecified severity, with other behavioral disturbance: Secondary | ICD-10-CM | POA: Diagnosis not present

## 2023-05-26 DIAGNOSIS — Z7982 Long term (current) use of aspirin: Secondary | ICD-10-CM | POA: Diagnosis not present

## 2023-05-26 DIAGNOSIS — Z96643 Presence of artificial hip joint, bilateral: Secondary | ICD-10-CM | POA: Diagnosis not present

## 2023-05-26 DIAGNOSIS — Z952 Presence of prosthetic heart valve: Secondary | ICD-10-CM | POA: Diagnosis not present

## 2023-05-26 DIAGNOSIS — E785 Hyperlipidemia, unspecified: Secondary | ICD-10-CM | POA: Diagnosis not present

## 2023-05-26 DIAGNOSIS — Z9181 History of falling: Secondary | ICD-10-CM | POA: Diagnosis not present

## 2023-05-26 DIAGNOSIS — I1310 Hypertensive heart and chronic kidney disease without heart failure, with stage 1 through stage 4 chronic kidney disease, or unspecified chronic kidney disease: Secondary | ICD-10-CM | POA: Diagnosis not present

## 2023-05-26 DIAGNOSIS — I442 Atrioventricular block, complete: Secondary | ICD-10-CM | POA: Diagnosis not present

## 2023-05-26 DIAGNOSIS — Z8673 Personal history of transient ischemic attack (TIA), and cerebral infarction without residual deficits: Secondary | ICD-10-CM | POA: Diagnosis not present

## 2023-05-26 DIAGNOSIS — D631 Anemia in chronic kidney disease: Secondary | ICD-10-CM | POA: Diagnosis not present

## 2023-05-26 DIAGNOSIS — G309 Alzheimer's disease, unspecified: Secondary | ICD-10-CM | POA: Diagnosis not present

## 2023-05-26 DIAGNOSIS — E039 Hypothyroidism, unspecified: Secondary | ICD-10-CM | POA: Diagnosis not present

## 2023-05-26 DIAGNOSIS — G43909 Migraine, unspecified, not intractable, without status migrainosus: Secondary | ICD-10-CM | POA: Diagnosis not present

## 2023-05-26 DIAGNOSIS — M1990 Unspecified osteoarthritis, unspecified site: Secondary | ICD-10-CM | POA: Diagnosis not present

## 2023-05-26 DIAGNOSIS — Z950 Presence of cardiac pacemaker: Secondary | ICD-10-CM | POA: Diagnosis not present

## 2023-05-26 DIAGNOSIS — I2510 Atherosclerotic heart disease of native coronary artery without angina pectoris: Secondary | ICD-10-CM | POA: Diagnosis not present

## 2023-05-26 DIAGNOSIS — I447 Left bundle-branch block, unspecified: Secondary | ICD-10-CM | POA: Diagnosis not present

## 2023-05-26 DIAGNOSIS — M879 Osteonecrosis, unspecified: Secondary | ICD-10-CM | POA: Diagnosis not present

## 2023-05-26 DIAGNOSIS — Z79899 Other long term (current) drug therapy: Secondary | ICD-10-CM | POA: Diagnosis not present

## 2023-05-26 DIAGNOSIS — J4489 Other specified chronic obstructive pulmonary disease: Secondary | ICD-10-CM | POA: Diagnosis not present

## 2023-05-30 ENCOUNTER — Ambulatory Visit (INDEPENDENT_AMBULATORY_CARE_PROVIDER_SITE_OTHER): Payer: Medicare Other

## 2023-05-30 DIAGNOSIS — E039 Hypothyroidism, unspecified: Secondary | ICD-10-CM | POA: Diagnosis not present

## 2023-05-30 DIAGNOSIS — I441 Atrioventricular block, second degree: Secondary | ICD-10-CM

## 2023-05-30 DIAGNOSIS — Z8673 Personal history of transient ischemic attack (TIA), and cerebral infarction without residual deficits: Secondary | ICD-10-CM | POA: Diagnosis not present

## 2023-05-30 DIAGNOSIS — M1990 Unspecified osteoarthritis, unspecified site: Secondary | ICD-10-CM | POA: Diagnosis not present

## 2023-05-30 DIAGNOSIS — D631 Anemia in chronic kidney disease: Secondary | ICD-10-CM | POA: Diagnosis not present

## 2023-05-30 DIAGNOSIS — M879 Osteonecrosis, unspecified: Secondary | ICD-10-CM | POA: Diagnosis not present

## 2023-05-30 DIAGNOSIS — Z950 Presence of cardiac pacemaker: Secondary | ICD-10-CM | POA: Diagnosis not present

## 2023-05-30 DIAGNOSIS — Z9181 History of falling: Secondary | ICD-10-CM | POA: Diagnosis not present

## 2023-05-30 DIAGNOSIS — I447 Left bundle-branch block, unspecified: Secondary | ICD-10-CM | POA: Diagnosis not present

## 2023-05-30 DIAGNOSIS — J4489 Other specified chronic obstructive pulmonary disease: Secondary | ICD-10-CM | POA: Diagnosis not present

## 2023-05-30 DIAGNOSIS — Z79899 Other long term (current) drug therapy: Secondary | ICD-10-CM | POA: Diagnosis not present

## 2023-05-30 DIAGNOSIS — G309 Alzheimer's disease, unspecified: Secondary | ICD-10-CM | POA: Diagnosis not present

## 2023-05-30 DIAGNOSIS — G43909 Migraine, unspecified, not intractable, without status migrainosus: Secondary | ICD-10-CM | POA: Diagnosis not present

## 2023-05-30 DIAGNOSIS — I1310 Hypertensive heart and chronic kidney disease without heart failure, with stage 1 through stage 4 chronic kidney disease, or unspecified chronic kidney disease: Secondary | ICD-10-CM | POA: Diagnosis not present

## 2023-05-30 DIAGNOSIS — I2510 Atherosclerotic heart disease of native coronary artery without angina pectoris: Secondary | ICD-10-CM | POA: Diagnosis not present

## 2023-05-30 DIAGNOSIS — I714 Abdominal aortic aneurysm, without rupture, unspecified: Secondary | ICD-10-CM | POA: Diagnosis not present

## 2023-05-30 DIAGNOSIS — Z96643 Presence of artificial hip joint, bilateral: Secondary | ICD-10-CM | POA: Diagnosis not present

## 2023-05-30 DIAGNOSIS — I442 Atrioventricular block, complete: Secondary | ICD-10-CM | POA: Diagnosis not present

## 2023-05-30 DIAGNOSIS — Z7982 Long term (current) use of aspirin: Secondary | ICD-10-CM | POA: Diagnosis not present

## 2023-05-30 DIAGNOSIS — E785 Hyperlipidemia, unspecified: Secondary | ICD-10-CM | POA: Diagnosis not present

## 2023-05-30 DIAGNOSIS — N189 Chronic kidney disease, unspecified: Secondary | ICD-10-CM | POA: Diagnosis not present

## 2023-05-30 DIAGNOSIS — F02818 Dementia in other diseases classified elsewhere, unspecified severity, with other behavioral disturbance: Secondary | ICD-10-CM | POA: Diagnosis not present

## 2023-05-30 DIAGNOSIS — Z952 Presence of prosthetic heart valve: Secondary | ICD-10-CM | POA: Diagnosis not present

## 2023-05-30 LAB — CUP PACEART REMOTE DEVICE CHECK
Battery Impedance: 717 Ohm
Battery Remaining Longevity: 92 mo
Battery Voltage: 2.78 V
Brady Statistic AP VP Percent: 0 %
Brady Statistic AP VS Percent: 20 %
Brady Statistic AS VP Percent: 0 %
Brady Statistic AS VS Percent: 80 %
Date Time Interrogation Session: 20240805155307
Implantable Lead Connection Status: 753985
Implantable Lead Connection Status: 753985
Implantable Lead Implant Date: 20160112
Implantable Lead Implant Date: 20160112
Implantable Lead Location: 753859
Implantable Lead Location: 753860
Implantable Lead Model: 5076
Implantable Lead Model: 5076
Implantable Pulse Generator Implant Date: 20160112
Lead Channel Impedance Value: 472 Ohm
Lead Channel Impedance Value: 542 Ohm
Lead Channel Pacing Threshold Amplitude: 0.75 V
Lead Channel Pacing Threshold Amplitude: 1 V
Lead Channel Pacing Threshold Pulse Width: 0.4 ms
Lead Channel Pacing Threshold Pulse Width: 0.4 ms
Lead Channel Setting Pacing Amplitude: 1.5 V
Lead Channel Setting Pacing Amplitude: 2 V
Lead Channel Setting Pacing Pulse Width: 0.4 ms
Lead Channel Setting Sensing Sensitivity: 2 mV
Zone Setting Status: 755011
Zone Setting Status: 755011

## 2023-05-31 ENCOUNTER — Telehealth: Payer: Self-pay

## 2023-05-31 NOTE — Telephone Encounter (Signed)
Transition Care Management Follow-up Telephone Call Date of discharge and from where:  7/31 How have you been since you were released from the hospital? Doing ok  SNF Any questions or concerns? No  Items Reviewed: Did the pt receive and understand the discharge instructions provided? Yes  Medications obtained and verified? Yes  Other? No  Any new allergies since your discharge? No  Dietary orders reviewed? No Do you have support at home? Yes  SNF    Follow up appointments reviewed:  PCP Hospital f/u appt confirmed? No  Scheduled to see  on  @ . Specialist Hospital f/u appt confirmed? No  Scheduled to see  on  @ . Are transportation arrangements needed?No If their condition worsens, is the pt aware to call PCP or go to the Emergency Dept.? Yes Was the patient provided with contact information for the PCP's office or ED? Yes Was to pt encouraged to call back with questions or concerns? Yes

## 2023-06-01 DIAGNOSIS — F02818 Dementia in other diseases classified elsewhere, unspecified severity, with other behavioral disturbance: Secondary | ICD-10-CM | POA: Diagnosis not present

## 2023-06-01 DIAGNOSIS — Z950 Presence of cardiac pacemaker: Secondary | ICD-10-CM | POA: Diagnosis not present

## 2023-06-01 DIAGNOSIS — I2510 Atherosclerotic heart disease of native coronary artery without angina pectoris: Secondary | ICD-10-CM | POA: Diagnosis not present

## 2023-06-01 DIAGNOSIS — I714 Abdominal aortic aneurysm, without rupture, unspecified: Secondary | ICD-10-CM | POA: Diagnosis not present

## 2023-06-01 DIAGNOSIS — I447 Left bundle-branch block, unspecified: Secondary | ICD-10-CM | POA: Diagnosis not present

## 2023-06-01 DIAGNOSIS — J4489 Other specified chronic obstructive pulmonary disease: Secondary | ICD-10-CM | POA: Diagnosis not present

## 2023-06-01 DIAGNOSIS — E039 Hypothyroidism, unspecified: Secondary | ICD-10-CM | POA: Diagnosis not present

## 2023-06-01 DIAGNOSIS — Z9181 History of falling: Secondary | ICD-10-CM | POA: Diagnosis not present

## 2023-06-01 DIAGNOSIS — N189 Chronic kidney disease, unspecified: Secondary | ICD-10-CM | POA: Diagnosis not present

## 2023-06-01 DIAGNOSIS — G43909 Migraine, unspecified, not intractable, without status migrainosus: Secondary | ICD-10-CM | POA: Diagnosis not present

## 2023-06-01 DIAGNOSIS — Z79899 Other long term (current) drug therapy: Secondary | ICD-10-CM | POA: Diagnosis not present

## 2023-06-01 DIAGNOSIS — I442 Atrioventricular block, complete: Secondary | ICD-10-CM | POA: Diagnosis not present

## 2023-06-01 DIAGNOSIS — M879 Osteonecrosis, unspecified: Secondary | ICD-10-CM | POA: Diagnosis not present

## 2023-06-01 DIAGNOSIS — D631 Anemia in chronic kidney disease: Secondary | ICD-10-CM | POA: Diagnosis not present

## 2023-06-01 DIAGNOSIS — Z8673 Personal history of transient ischemic attack (TIA), and cerebral infarction without residual deficits: Secondary | ICD-10-CM | POA: Diagnosis not present

## 2023-06-01 DIAGNOSIS — G309 Alzheimer's disease, unspecified: Secondary | ICD-10-CM | POA: Diagnosis not present

## 2023-06-01 DIAGNOSIS — M1990 Unspecified osteoarthritis, unspecified site: Secondary | ICD-10-CM | POA: Diagnosis not present

## 2023-06-01 DIAGNOSIS — Z952 Presence of prosthetic heart valve: Secondary | ICD-10-CM | POA: Diagnosis not present

## 2023-06-01 DIAGNOSIS — Z7982 Long term (current) use of aspirin: Secondary | ICD-10-CM | POA: Diagnosis not present

## 2023-06-01 DIAGNOSIS — I1310 Hypertensive heart and chronic kidney disease without heart failure, with stage 1 through stage 4 chronic kidney disease, or unspecified chronic kidney disease: Secondary | ICD-10-CM | POA: Diagnosis not present

## 2023-06-01 DIAGNOSIS — E785 Hyperlipidemia, unspecified: Secondary | ICD-10-CM | POA: Diagnosis not present

## 2023-06-01 DIAGNOSIS — Z96643 Presence of artificial hip joint, bilateral: Secondary | ICD-10-CM | POA: Diagnosis not present

## 2023-06-08 DIAGNOSIS — E039 Hypothyroidism, unspecified: Secondary | ICD-10-CM | POA: Diagnosis not present

## 2023-06-08 DIAGNOSIS — F02818 Dementia in other diseases classified elsewhere, unspecified severity, with other behavioral disturbance: Secondary | ICD-10-CM | POA: Diagnosis not present

## 2023-06-08 DIAGNOSIS — G43909 Migraine, unspecified, not intractable, without status migrainosus: Secondary | ICD-10-CM | POA: Diagnosis not present

## 2023-06-08 DIAGNOSIS — G309 Alzheimer's disease, unspecified: Secondary | ICD-10-CM | POA: Diagnosis not present

## 2023-06-08 DIAGNOSIS — E785 Hyperlipidemia, unspecified: Secondary | ICD-10-CM | POA: Diagnosis not present

## 2023-06-08 DIAGNOSIS — I442 Atrioventricular block, complete: Secondary | ICD-10-CM | POA: Diagnosis not present

## 2023-06-08 DIAGNOSIS — N189 Chronic kidney disease, unspecified: Secondary | ICD-10-CM | POA: Diagnosis not present

## 2023-06-08 DIAGNOSIS — D631 Anemia in chronic kidney disease: Secondary | ICD-10-CM | POA: Diagnosis not present

## 2023-06-08 DIAGNOSIS — Z950 Presence of cardiac pacemaker: Secondary | ICD-10-CM | POA: Diagnosis not present

## 2023-06-08 DIAGNOSIS — Z79899 Other long term (current) drug therapy: Secondary | ICD-10-CM | POA: Diagnosis not present

## 2023-06-08 DIAGNOSIS — J4489 Other specified chronic obstructive pulmonary disease: Secondary | ICD-10-CM | POA: Diagnosis not present

## 2023-06-08 DIAGNOSIS — I714 Abdominal aortic aneurysm, without rupture, unspecified: Secondary | ICD-10-CM | POA: Diagnosis not present

## 2023-06-08 DIAGNOSIS — I1310 Hypertensive heart and chronic kidney disease without heart failure, with stage 1 through stage 4 chronic kidney disease, or unspecified chronic kidney disease: Secondary | ICD-10-CM | POA: Diagnosis not present

## 2023-06-08 DIAGNOSIS — Z7982 Long term (current) use of aspirin: Secondary | ICD-10-CM | POA: Diagnosis not present

## 2023-06-08 DIAGNOSIS — I447 Left bundle-branch block, unspecified: Secondary | ICD-10-CM | POA: Diagnosis not present

## 2023-06-08 DIAGNOSIS — M879 Osteonecrosis, unspecified: Secondary | ICD-10-CM | POA: Diagnosis not present

## 2023-06-08 DIAGNOSIS — Z9181 History of falling: Secondary | ICD-10-CM | POA: Diagnosis not present

## 2023-06-08 DIAGNOSIS — Z96643 Presence of artificial hip joint, bilateral: Secondary | ICD-10-CM | POA: Diagnosis not present

## 2023-06-08 DIAGNOSIS — I2510 Atherosclerotic heart disease of native coronary artery without angina pectoris: Secondary | ICD-10-CM | POA: Diagnosis not present

## 2023-06-08 DIAGNOSIS — Z8673 Personal history of transient ischemic attack (TIA), and cerebral infarction without residual deficits: Secondary | ICD-10-CM | POA: Diagnosis not present

## 2023-06-08 DIAGNOSIS — Z952 Presence of prosthetic heart valve: Secondary | ICD-10-CM | POA: Diagnosis not present

## 2023-06-08 DIAGNOSIS — M1990 Unspecified osteoarthritis, unspecified site: Secondary | ICD-10-CM | POA: Diagnosis not present

## 2023-06-09 DIAGNOSIS — Z9181 History of falling: Secondary | ICD-10-CM | POA: Diagnosis not present

## 2023-06-09 DIAGNOSIS — Z7982 Long term (current) use of aspirin: Secondary | ICD-10-CM | POA: Diagnosis not present

## 2023-06-09 DIAGNOSIS — I447 Left bundle-branch block, unspecified: Secondary | ICD-10-CM | POA: Diagnosis not present

## 2023-06-09 DIAGNOSIS — E039 Hypothyroidism, unspecified: Secondary | ICD-10-CM | POA: Diagnosis not present

## 2023-06-09 DIAGNOSIS — M879 Osteonecrosis, unspecified: Secondary | ICD-10-CM | POA: Diagnosis not present

## 2023-06-09 DIAGNOSIS — N189 Chronic kidney disease, unspecified: Secondary | ICD-10-CM | POA: Diagnosis not present

## 2023-06-09 DIAGNOSIS — M1990 Unspecified osteoarthritis, unspecified site: Secondary | ICD-10-CM | POA: Diagnosis not present

## 2023-06-09 DIAGNOSIS — E785 Hyperlipidemia, unspecified: Secondary | ICD-10-CM | POA: Diagnosis not present

## 2023-06-09 DIAGNOSIS — Z79899 Other long term (current) drug therapy: Secondary | ICD-10-CM | POA: Diagnosis not present

## 2023-06-09 DIAGNOSIS — J4489 Other specified chronic obstructive pulmonary disease: Secondary | ICD-10-CM | POA: Diagnosis not present

## 2023-06-09 DIAGNOSIS — D631 Anemia in chronic kidney disease: Secondary | ICD-10-CM | POA: Diagnosis not present

## 2023-06-09 DIAGNOSIS — Z950 Presence of cardiac pacemaker: Secondary | ICD-10-CM | POA: Diagnosis not present

## 2023-06-09 DIAGNOSIS — Z96643 Presence of artificial hip joint, bilateral: Secondary | ICD-10-CM | POA: Diagnosis not present

## 2023-06-09 DIAGNOSIS — I442 Atrioventricular block, complete: Secondary | ICD-10-CM | POA: Diagnosis not present

## 2023-06-09 DIAGNOSIS — I714 Abdominal aortic aneurysm, without rupture, unspecified: Secondary | ICD-10-CM | POA: Diagnosis not present

## 2023-06-09 DIAGNOSIS — G43909 Migraine, unspecified, not intractable, without status migrainosus: Secondary | ICD-10-CM | POA: Diagnosis not present

## 2023-06-09 DIAGNOSIS — I1310 Hypertensive heart and chronic kidney disease without heart failure, with stage 1 through stage 4 chronic kidney disease, or unspecified chronic kidney disease: Secondary | ICD-10-CM | POA: Diagnosis not present

## 2023-06-09 DIAGNOSIS — F02818 Dementia in other diseases classified elsewhere, unspecified severity, with other behavioral disturbance: Secondary | ICD-10-CM | POA: Diagnosis not present

## 2023-06-09 DIAGNOSIS — Z8673 Personal history of transient ischemic attack (TIA), and cerebral infarction without residual deficits: Secondary | ICD-10-CM | POA: Diagnosis not present

## 2023-06-09 DIAGNOSIS — Z952 Presence of prosthetic heart valve: Secondary | ICD-10-CM | POA: Diagnosis not present

## 2023-06-09 DIAGNOSIS — I2510 Atherosclerotic heart disease of native coronary artery without angina pectoris: Secondary | ICD-10-CM | POA: Diagnosis not present

## 2023-06-09 DIAGNOSIS — G309 Alzheimer's disease, unspecified: Secondary | ICD-10-CM | POA: Diagnosis not present

## 2023-06-10 NOTE — Progress Notes (Signed)
Remote pacemaker transmission.   

## 2023-06-13 DIAGNOSIS — I714 Abdominal aortic aneurysm, without rupture, unspecified: Secondary | ICD-10-CM | POA: Diagnosis not present

## 2023-06-13 DIAGNOSIS — N189 Chronic kidney disease, unspecified: Secondary | ICD-10-CM | POA: Diagnosis not present

## 2023-06-13 DIAGNOSIS — M1990 Unspecified osteoarthritis, unspecified site: Secondary | ICD-10-CM | POA: Diagnosis not present

## 2023-06-13 DIAGNOSIS — Z9181 History of falling: Secondary | ICD-10-CM | POA: Diagnosis not present

## 2023-06-13 DIAGNOSIS — Z96643 Presence of artificial hip joint, bilateral: Secondary | ICD-10-CM | POA: Diagnosis not present

## 2023-06-13 DIAGNOSIS — Z8673 Personal history of transient ischemic attack (TIA), and cerebral infarction without residual deficits: Secondary | ICD-10-CM | POA: Diagnosis not present

## 2023-06-13 DIAGNOSIS — G43909 Migraine, unspecified, not intractable, without status migrainosus: Secondary | ICD-10-CM | POA: Diagnosis not present

## 2023-06-13 DIAGNOSIS — I447 Left bundle-branch block, unspecified: Secondary | ICD-10-CM | POA: Diagnosis not present

## 2023-06-13 DIAGNOSIS — E785 Hyperlipidemia, unspecified: Secondary | ICD-10-CM | POA: Diagnosis not present

## 2023-06-13 DIAGNOSIS — D631 Anemia in chronic kidney disease: Secondary | ICD-10-CM | POA: Diagnosis not present

## 2023-06-13 DIAGNOSIS — I1310 Hypertensive heart and chronic kidney disease without heart failure, with stage 1 through stage 4 chronic kidney disease, or unspecified chronic kidney disease: Secondary | ICD-10-CM | POA: Diagnosis not present

## 2023-06-13 DIAGNOSIS — I2510 Atherosclerotic heart disease of native coronary artery without angina pectoris: Secondary | ICD-10-CM | POA: Diagnosis not present

## 2023-06-13 DIAGNOSIS — J4489 Other specified chronic obstructive pulmonary disease: Secondary | ICD-10-CM | POA: Diagnosis not present

## 2023-06-13 DIAGNOSIS — G309 Alzheimer's disease, unspecified: Secondary | ICD-10-CM | POA: Diagnosis not present

## 2023-06-13 DIAGNOSIS — E039 Hypothyroidism, unspecified: Secondary | ICD-10-CM | POA: Diagnosis not present

## 2023-06-13 DIAGNOSIS — M879 Osteonecrosis, unspecified: Secondary | ICD-10-CM | POA: Diagnosis not present

## 2023-06-13 DIAGNOSIS — Z7982 Long term (current) use of aspirin: Secondary | ICD-10-CM | POA: Diagnosis not present

## 2023-06-13 DIAGNOSIS — Z952 Presence of prosthetic heart valve: Secondary | ICD-10-CM | POA: Diagnosis not present

## 2023-06-13 DIAGNOSIS — I442 Atrioventricular block, complete: Secondary | ICD-10-CM | POA: Diagnosis not present

## 2023-06-13 DIAGNOSIS — Z950 Presence of cardiac pacemaker: Secondary | ICD-10-CM | POA: Diagnosis not present

## 2023-06-13 DIAGNOSIS — Z79899 Other long term (current) drug therapy: Secondary | ICD-10-CM | POA: Diagnosis not present

## 2023-06-14 ENCOUNTER — Telehealth: Payer: Self-pay | Admitting: Primary Care

## 2023-06-14 ENCOUNTER — Other Ambulatory Visit: Payer: Self-pay

## 2023-06-14 ENCOUNTER — Telehealth: Payer: Self-pay | Admitting: Cardiovascular Disease

## 2023-06-14 DIAGNOSIS — I2510 Atherosclerotic heart disease of native coronary artery without angina pectoris: Secondary | ICD-10-CM

## 2023-06-14 DIAGNOSIS — F03911 Unspecified dementia, unspecified severity, with agitation: Secondary | ICD-10-CM

## 2023-06-14 MED ORDER — ASPIRIN 81 MG PO TBEC: DELAYED_RELEASE_TABLET | ORAL | 3 refills | Status: DC

## 2023-06-14 NOTE — Telephone Encounter (Signed)
Ladonna frome [employer] called stated that patient had a fall today un witness but is ok. Just a fyi

## 2023-06-14 NOTE — Telephone Encounter (Signed)
Patient is to take 81 mg EC aspirin 1 tablet daily

## 2023-06-14 NOTE — Telephone Encounter (Signed)
Pt c/o medication issue:  1. Name of Medication:   aspirin EC (ASPIRIN LOW DOSE) 81 MG tablet    2. How are you currently taking this medication (dosage and times per day)?  Take 81 mg twice daily for 4 weeks and then once daily as before       3. Are you having a reaction (difficulty breathing--STAT)? No  4. What is your medication issue? Greggory Stallion from [facility] is requesting a callback regarding dosage and amount of times pt should be taking this medication. Please advise

## 2023-06-15 NOTE — Telephone Encounter (Signed)
Noted.  I am also speaking with the daughter via MyChart.

## 2023-06-16 DIAGNOSIS — Z9181 History of falling: Secondary | ICD-10-CM | POA: Diagnosis not present

## 2023-06-16 DIAGNOSIS — Z96643 Presence of artificial hip joint, bilateral: Secondary | ICD-10-CM | POA: Diagnosis not present

## 2023-06-16 DIAGNOSIS — M879 Osteonecrosis, unspecified: Secondary | ICD-10-CM | POA: Diagnosis not present

## 2023-06-16 DIAGNOSIS — D631 Anemia in chronic kidney disease: Secondary | ICD-10-CM | POA: Diagnosis not present

## 2023-06-16 DIAGNOSIS — G309 Alzheimer's disease, unspecified: Secondary | ICD-10-CM | POA: Diagnosis not present

## 2023-06-16 DIAGNOSIS — E785 Hyperlipidemia, unspecified: Secondary | ICD-10-CM | POA: Diagnosis not present

## 2023-06-16 DIAGNOSIS — Z950 Presence of cardiac pacemaker: Secondary | ICD-10-CM | POA: Diagnosis not present

## 2023-06-16 DIAGNOSIS — N189 Chronic kidney disease, unspecified: Secondary | ICD-10-CM | POA: Diagnosis not present

## 2023-06-16 DIAGNOSIS — G43909 Migraine, unspecified, not intractable, without status migrainosus: Secondary | ICD-10-CM | POA: Diagnosis not present

## 2023-06-16 DIAGNOSIS — E039 Hypothyroidism, unspecified: Secondary | ICD-10-CM | POA: Diagnosis not present

## 2023-06-16 DIAGNOSIS — I2510 Atherosclerotic heart disease of native coronary artery without angina pectoris: Secondary | ICD-10-CM | POA: Diagnosis not present

## 2023-06-16 DIAGNOSIS — I447 Left bundle-branch block, unspecified: Secondary | ICD-10-CM | POA: Diagnosis not present

## 2023-06-16 DIAGNOSIS — Z7982 Long term (current) use of aspirin: Secondary | ICD-10-CM | POA: Diagnosis not present

## 2023-06-16 DIAGNOSIS — Z8673 Personal history of transient ischemic attack (TIA), and cerebral infarction without residual deficits: Secondary | ICD-10-CM | POA: Diagnosis not present

## 2023-06-16 DIAGNOSIS — J4489 Other specified chronic obstructive pulmonary disease: Secondary | ICD-10-CM | POA: Diagnosis not present

## 2023-06-16 DIAGNOSIS — I1310 Hypertensive heart and chronic kidney disease without heart failure, with stage 1 through stage 4 chronic kidney disease, or unspecified chronic kidney disease: Secondary | ICD-10-CM | POA: Diagnosis not present

## 2023-06-16 DIAGNOSIS — M1990 Unspecified osteoarthritis, unspecified site: Secondary | ICD-10-CM | POA: Diagnosis not present

## 2023-06-16 DIAGNOSIS — I442 Atrioventricular block, complete: Secondary | ICD-10-CM | POA: Diagnosis not present

## 2023-06-16 DIAGNOSIS — Z79899 Other long term (current) drug therapy: Secondary | ICD-10-CM | POA: Diagnosis not present

## 2023-06-16 DIAGNOSIS — I714 Abdominal aortic aneurysm, without rupture, unspecified: Secondary | ICD-10-CM | POA: Diagnosis not present

## 2023-06-16 DIAGNOSIS — Z952 Presence of prosthetic heart valve: Secondary | ICD-10-CM | POA: Diagnosis not present

## 2023-06-17 MED ORDER — ESCITALOPRAM OXALATE 5 MG PO TABS: 5.0000 mg | ORAL_TABLET | Freq: Every day | ORAL | 0 refills | Status: DC

## 2023-06-20 ENCOUNTER — Emergency Department: Payer: Medicare Other

## 2023-06-20 ENCOUNTER — Encounter: Payer: Self-pay | Admitting: Emergency Medicine

## 2023-06-20 ENCOUNTER — Other Ambulatory Visit: Payer: Self-pay

## 2023-06-20 ENCOUNTER — Emergency Department
Admission: EM | Admit: 2023-06-20 | Discharge: 2023-06-20 | Disposition: A | Discharge status: Home / Self Care | Payer: Medicare Other | Attending: Emergency Medicine | Admitting: Emergency Medicine

## 2023-06-20 DIAGNOSIS — I2510 Atherosclerotic heart disease of native coronary artery without angina pectoris: Secondary | ICD-10-CM | POA: Insufficient documentation

## 2023-06-20 DIAGNOSIS — Z471 Aftercare following joint replacement surgery: Secondary | ICD-10-CM | POA: Diagnosis not present

## 2023-06-20 DIAGNOSIS — M25551 Pain in right hip: Secondary | ICD-10-CM | POA: Diagnosis not present

## 2023-06-20 DIAGNOSIS — S7001XA Contusion of right hip, initial encounter: Secondary | ICD-10-CM | POA: Insufficient documentation

## 2023-06-20 DIAGNOSIS — S300XXA Contusion of lower back and pelvis, initial encounter: Secondary | ICD-10-CM | POA: Insufficient documentation

## 2023-06-20 DIAGNOSIS — Z8673 Personal history of transient ischemic attack (TIA), and cerebral infarction without residual deficits: Secondary | ICD-10-CM | POA: Insufficient documentation

## 2023-06-20 DIAGNOSIS — G309 Alzheimer's disease, unspecified: Secondary | ICD-10-CM | POA: Diagnosis not present

## 2023-06-20 DIAGNOSIS — Z96643 Presence of artificial hip joint, bilateral: Secondary | ICD-10-CM | POA: Diagnosis not present

## 2023-06-20 DIAGNOSIS — I10 Essential (primary) hypertension: Secondary | ICD-10-CM | POA: Diagnosis not present

## 2023-06-20 DIAGNOSIS — Z043 Encounter for examination and observation following other accident: Secondary | ICD-10-CM | POA: Diagnosis not present

## 2023-06-20 DIAGNOSIS — S91011A Laceration without foreign body, right ankle, initial encounter: Secondary | ICD-10-CM | POA: Insufficient documentation

## 2023-06-20 DIAGNOSIS — I6782 Cerebral ischemia: Secondary | ICD-10-CM | POA: Diagnosis not present

## 2023-06-20 DIAGNOSIS — W19XXXA Unspecified fall, initial encounter: Secondary | ICD-10-CM

## 2023-06-20 DIAGNOSIS — W1830XA Fall on same level, unspecified, initial encounter: Secondary | ICD-10-CM | POA: Diagnosis not present

## 2023-06-20 DIAGNOSIS — R9089 Other abnormal findings on diagnostic imaging of central nervous system: Secondary | ICD-10-CM | POA: Diagnosis not present

## 2023-06-20 MED ORDER — ACETAMINOPHEN 325 MG PO TABS
650.0000 mg | ORAL_TABLET | Freq: Once | ORAL | Status: AC
  Administered 2023-06-20: 650 mg via ORAL
  Filled 2023-06-20: qty 2

## 2023-06-20 NOTE — ED Triage Notes (Signed)
Pt comes via EMS from [facility] [place]. Pt states she fell over her feet. Pt has hx of dementia. Per staff pt has red spot to back of head and needed to be sent out. Pt states bilateral hip pain.

## 2023-06-20 NOTE — ED Provider Notes (Signed)
[facility] Provider Note    Event Date/Time   First MD Initiated Contact with Patient 06/20/23 1750     (approximate)   History   Hip Pain   HPI  [name] is a 89 y.o. female with PMH of HTN, arthritis, CAD, stroke and Alzheimer's who presents for evaluation of hip pain after a mechanical fall.  Patient's family member states that she is cared for in a nursing facility.  They are unsure if she hit her head.      Physical Exam   Triage Vital Signs: ED Triage Vitals  Encounter Vitals Group     BP 06/20/23 1619 (!) 163/92     Systolic BP Percentile --      Diastolic BP Percentile --      Pulse Rate 06/20/23 1619 77     Resp 06/20/23 1619 18     Temp 06/20/23 1619 98.4 F (36.9 C)     Temp src --      SpO2 06/20/23 1619 95 %     Weight --      Height --      Head Circumference --      Peak Flow --      Pain Score 06/20/23 1617 5     Pain Loc --      Pain Education --      Exclude from Growth Chart --     Most recent vital signs: Vitals:   06/20/23 1619  BP: (!) 163/92  Pulse: 77  Resp: 18  Temp: 98.4 F (36.9 C)  SpO2: 95%   General: Awake, no distress.  CV:  Good peripheral perfusion.  Resp:  Normal effort.  Abd:  No distention.  Other:  Small skin tear to the anterior right ankle, bleeding controlled.  Bruising and tenderness on the right hip and buttock.  Patient stable while walking.   ED Results / Procedures / Treatments   Labs (all labs ordered are listed, but only abnormal results are displayed) Labs Reviewed - No data to display   RADIOLOGY  CT head, cervical spine and bilateral hip x-ray obtained, interpreted the images as well as reviewed the radiologist report.  No acute intracranial pathology, no traumatic alignment of the cervical spines or fractures, no fractures of the hip or femur, hip replacements appear stable.    PROCEDURES:  Critical Care performed: No  ..Laceration Repair  Date/Time:  06/20/2023 7:12 PM  Performed by: Cameron Ali, PA-C Authorized by: Cameron Ali, PA-C   Consent:    Consent obtained:  Verbal   Consent given by:  Guardian   Risks, benefits, and alternatives were discussed: yes     Risks discussed:  Poor cosmetic result, poor wound healing and pain Universal protocol:    Patient identity confirmed:  Arm band Anesthesia:    Anesthesia method:  None Laceration details:    Location:  Leg   Leg location:  R lower leg   Length (cm):  2   Depth (mm):  1 Treatment:    Area cleansed with:  Saline   Amount of cleaning:  Standard   Irrigation solution:  Sterile saline   Irrigation method:  Syringe Skin repair:    Repair method:  Steri-Strips   Number of Steri-Strips:  3 Approximation:    Approximation:  Close Post-procedure details:    Dressing:  Bulky dressing   Procedure completion:  Tolerated well, no immediate complications    MEDICATIONS ORDERED IN ED:  Medications  acetaminophen (TYLENOL) tablet 650 mg (has no administration in time range)     IMPRESSION / MDM / ASSESSMENT AND PLAN / ED COURSE  I reviewed the triage vital signs and the nursing notes.                             89 year old female presents for evaluation of hip pain after mechanical fall earlier today.  Patient was hypertensive in triage otherwise VSS.  Patient NAD on exam.  Differential diagnosis includes, but is not limited to, contusion, hematoma, laceration, intracranial bleed, fracture, dislocation, muscle strain.  Patient's presentation is most consistent with acute complicated illness / injury requiring diagnostic workup.  CT head and neck obtained as patient unable to recall if she hit her head.  Images did not show any traumatic alignment of the spine and no acute intracranial pathologies.  Bilateral hip x-rays obtained as patient reported pain to the area, no fractures, dislocations and hip replacements appear stable.  Given her negative imaging,  I feel she is safe for discharge back to her care facility.  Skin tear repaired as described in the procedure note above.  Patient can take Tylenol as needed for her pain.  Patient and family members were agreeable to plan, all questions were answered and she was stable at discharge.   FINAL CLINICAL IMPRESSION(S) / ED DIAGNOSES   Final diagnoses:  Fall, initial encounter     Rx / DC Orders   ED Discharge Orders     None        Note:  This document was prepared using Dragon voice recognition software and may include unintentional dictation errors.   Cameron Ali, PA-C 06/20/23 Fontaine No, MD 06/21/23 [phone]

## 2023-06-20 NOTE — Discharge Instructions (Signed)
Your imaging was reassuring.  Please take Tylenol as needed for your pain.  Leave the dressing on your ankle intact for 24 hours.  The Steri-Strips will begin to fall off on their own, avoid pulling them off.  Please keep covered until fully healed.

## 2023-06-20 NOTE — ED Notes (Signed)
Provided pt family members at bedside with discharge instructions and education. All of pt family questions answered. Pt in possession of all belongings. Pt Alert and stable at time of discharge.Pt wheeled and assisted into family vehicle safely.

## 2023-06-20 NOTE — ED Notes (Signed)
Pt hit leg on bathroom stall. Pt has laceration open to right shin lower leg. Bandage placed at this time.

## 2023-06-21 ENCOUNTER — Other Ambulatory Visit: Payer: Self-pay

## 2023-06-21 ENCOUNTER — Encounter: Payer: Self-pay | Admitting: Emergency Medicine

## 2023-06-21 ENCOUNTER — Telehealth: Payer: Self-pay | Admitting: Primary Care

## 2023-06-21 ENCOUNTER — Emergency Department: Payer: Medicare Other

## 2023-06-21 ENCOUNTER — Emergency Department
Admission: EM | Admit: 2023-06-21 | Discharge: 2023-06-21 | Disposition: A | Discharge status: Skilled Nursing Facility | Payer: Medicare Other | Attending: Student in an Organized Health Care Education/Training Program | Admitting: Student in an Organized Health Care Education/Training Program

## 2023-06-21 DIAGNOSIS — Z043 Encounter for examination and observation following other accident: Secondary | ICD-10-CM | POA: Diagnosis not present

## 2023-06-21 DIAGNOSIS — Z96641 Presence of right artificial hip joint: Secondary | ICD-10-CM | POA: Insufficient documentation

## 2023-06-21 DIAGNOSIS — F0280 Dementia in other diseases classified elsewhere without behavioral disturbance: Secondary | ICD-10-CM

## 2023-06-21 DIAGNOSIS — Z950 Presence of cardiac pacemaker: Secondary | ICD-10-CM | POA: Diagnosis not present

## 2023-06-21 DIAGNOSIS — W19XXXA Unspecified fall, initial encounter: Secondary | ICD-10-CM | POA: Insufficient documentation

## 2023-06-21 DIAGNOSIS — G309 Alzheimer's disease, unspecified: Secondary | ICD-10-CM | POA: Diagnosis not present

## 2023-06-21 DIAGNOSIS — I129 Hypertensive chronic kidney disease with stage 1 through stage 4 chronic kidney disease, or unspecified chronic kidney disease: Secondary | ICD-10-CM | POA: Insufficient documentation

## 2023-06-21 DIAGNOSIS — Z96642 Presence of left artificial hip joint: Secondary | ICD-10-CM | POA: Insufficient documentation

## 2023-06-21 DIAGNOSIS — Z8673 Personal history of transient ischemic attack (TIA), and cerebral infarction without residual deficits: Secondary | ICD-10-CM | POA: Insufficient documentation

## 2023-06-21 DIAGNOSIS — S8002XA Contusion of left knee, initial encounter: Secondary | ICD-10-CM | POA: Diagnosis not present

## 2023-06-21 DIAGNOSIS — N189 Chronic kidney disease, unspecified: Secondary | ICD-10-CM | POA: Diagnosis not present

## 2023-06-21 DIAGNOSIS — S0990XA Unspecified injury of head, initial encounter: Secondary | ICD-10-CM | POA: Diagnosis not present

## 2023-06-21 DIAGNOSIS — Z743 Need for continuous supervision: Secondary | ICD-10-CM | POA: Diagnosis not present

## 2023-06-21 DIAGNOSIS — R6889 Other general symptoms and signs: Secondary | ICD-10-CM | POA: Diagnosis not present

## 2023-06-21 LAB — BASIC METABOLIC PANEL
Anion gap: 7 (ref 5–15)
BUN: 13 mg/dL (ref 8–23)
CO2: 27 mmol/L (ref 22–32)
Calcium: 8.3 mg/dL — ABNORMAL LOW (ref 8.9–10.3)
Chloride: 101 mmol/L (ref 98–111)
Creatinine, Ser: 0.99 mg/dL (ref 0.44–1.00)
GFR, Estimated: 56 mL/min — ABNORMAL LOW (ref >60)
Glucose, Bld: 133 mg/dL — ABNORMAL HIGH (ref 70–99)
Potassium: 3.4 mmol/L — ABNORMAL LOW (ref 3.5–5.1)
Sodium: 135 mmol/L (ref 135–145)

## 2023-06-21 LAB — CBC WITH DIFFERENTIAL/PLATELET
Abs Immature Granulocytes: 0.03 10*3/uL (ref 0.00–0.07)
Basophils Absolute: 0 10*3/uL (ref 0.0–0.1)
Basophils Relative: 1 %
Eosinophils Absolute: 0 10*3/uL (ref 0.0–0.5)
Eosinophils Relative: 1 %
HCT: 28.2 % — ABNORMAL LOW (ref 36.0–46.0)
Hemoglobin: 9.4 g/dL — ABNORMAL LOW (ref 12.0–15.0)
Immature Granulocytes: 1 %
Lymphocytes Relative: 10 %
Lymphs Abs: 0.6 10*3/uL — ABNORMAL LOW (ref 0.7–4.0)
MCH: 29.5 pg (ref 26.0–34.0)
MCHC: 33.3 g/dL (ref 30.0–36.0)
MCV: 88.4 fL (ref 80.0–100.0)
Monocytes Absolute: 0.5 10*3/uL (ref 0.1–1.0)
Monocytes Relative: 8 %
Neutro Abs: 4.9 10*3/uL (ref 1.7–7.7)
Neutrophils Relative %: 79 %
Platelets: 179 10*3/uL (ref 150–400)
RBC: 3.19 MIL/uL — ABNORMAL LOW (ref 3.87–5.11)
RDW: 13.5 % (ref 11.5–15.5)
WBC: 6 10*3/uL (ref 4.0–10.5)
nRBC: 0 % (ref 0.0–0.2)

## 2023-06-21 LAB — URINALYSIS, ROUTINE W REFLEX MICROSCOPIC
Bilirubin Urine: NEGATIVE
Glucose, UA: NEGATIVE mg/dL
Hgb urine dipstick: NEGATIVE
Ketones, ur: NEGATIVE mg/dL
Leukocytes,Ua: NEGATIVE
Nitrite: NEGATIVE
Protein, ur: NEGATIVE mg/dL
Specific Gravity, Urine: 1.006 (ref 1.005–1.030)
pH: 7 (ref 5.0–8.0)

## 2023-06-21 LAB — TROPONIN I (HIGH SENSITIVITY): Troponin I (High Sensitivity): 11 ng/L (ref <18)

## 2023-06-21 NOTE — Telephone Encounter (Signed)
Unable to reach patients daughter. Left voicemail to return call to our office.   

## 2023-06-21 NOTE — ED Triage Notes (Signed)
Presents via EMS from [facility]  Per EMS she was found on the floor in the bathroom  Unwitnessed fall  Denies any complaints at present   [id number]

## 2023-06-21 NOTE — ED Notes (Signed)
Pt is up to the bathroom with family

## 2023-06-21 NOTE — Discharge Instructions (Signed)
Continue using your walker anytime you are up walking.  Follow-up with your primary care provider if any continued problems.  Continue working with PT and OT at [facility].

## 2023-06-21 NOTE — Telephone Encounter (Signed)
Noted, will review ED notes.

## 2023-06-21 NOTE — Telephone Encounter (Signed)
Patient's daughter [name] called in requesting  a call back from clinical staff. Daughter states that patient has had 5 or 6 falls since the end of July, and 2 within the last 24 hours. Patient has been taken by EMS, she has a significant hematoma on her R side. Patient's daughter is needing some guidance on what to do, says patient is in memory care and seems to only fall when she is in the bathroom. Patient seems to lose her balance when she stands up from the toilet. Daughter is requesting a call back when possible.

## 2023-06-21 NOTE — ED Provider Notes (Signed)
[facility] Provider Note    Event Date/Time   First MD Initiated Contact with Patient 06/21/23 1048     (approximate)   History   Fall   HPI  ESTEPHANY PANT is a 89 y.o. female presents to the ED via EMS from [facility] when staff members found her on the floor in the bathroom.  Reportedly this is an unwitnessed fall.  Staff members report to EMS that she is at her baseline and patient denies any complaints.  Patient was seen in the emergency department yesterday for a "unwitnessed fall".  Patient had imaging done which was reassuring and return to [facility].  Daughter is here with patient.  Patient has a history of hypertension, MVP, Alzheimer's dementia, bradycardia, Mobitz type II secondary AV block, pacemaker, PAT, major depressive disorder, CKD, CVA, anemia, bilateral hip replacements.     Physical Exam   Triage Vital Signs: ED Triage Vitals  Encounter Vitals Group     BP 06/21/23 1054 (!) 146/64     Systolic BP Percentile --      Diastolic BP Percentile --      Pulse Rate 06/21/23 1054 (!) 59     Resp 06/21/23 1054 16     Temp 06/21/23 1054 98.2 F (36.8 C)     Temp Source 06/21/23 1054 Oral     SpO2 06/21/23 1054 99 %     Weight 06/21/23 1055 145 lb 1 oz (65.8 kg)     Height 06/21/23 1055 5\' 9"  (1.753 m)     Head Circumference --      Peak Flow --      Pain Score 06/21/23 1055 0     Pain Loc --      Pain Education --      Exclude from Growth Chart --     Most recent vital signs: Vitals:   06/21/23 1054 06/21/23 1258  BP: (!) 146/64 (!) 140/60  Pulse: (!) 59 60  Resp: 16 16  Temp: 98.2 F (36.8 C) 98 F (36.7 C)  SpO2: 99% 98%     General: Awake, no distress.  Alert and talkative with EMS moving patient to hospital stretcher. CV:  Good peripheral perfusion.  Regular rate and rhythm. Resp:  Normal effort.  Lungs are clear bilaterally. Abd:  No distention.  Other:  Patient is able move upper extremities without any  difficulty and no obvious deformity or bruising noted.  No tenderness noted with palpation of the posterior cervical spine or grimacing with range of motion.  Nontender chest to palpation bilateral ribs.  Nontender hips to compression and patient is able move lower extremities without any difficulty.  Bruising to the left knee appears to be old and in various stages of healing.  Dressing in place on the right lower extremity for Steri-Strips that were applied on yesterday's visit.  No active bleeding to this area.  Nontender thoracic or lumbar spine to palpation.   ED Results / Procedures / Treatments   Labs (all labs ordered are listed, but only abnormal results are displayed) Labs Reviewed  CBC WITH DIFFERENTIAL/PLATELET - Abnormal; Notable for the following components:      Result Value   RBC 3.19 (*)    Hemoglobin 9.4 (*)    HCT 28.2 (*)    Lymphs Abs 0.6 (*)    All other components within normal limits  BASIC METABOLIC PANEL - Abnormal; Notable for the following components:   Potassium 3.4 (*)  Glucose, Bld 133 (*)    Calcium 8.3 (*)    GFR, Estimated 56 (*)    All other components within normal limits  URINALYSIS, ROUTINE W REFLEX MICROSCOPIC - Abnormal; Notable for the following components:   Color, Urine STRAW (*)    APPearance CLEAR (*)    All other components within normal limits  TROPONIN I (HIGH SENSITIVITY)  TROPONIN I (HIGH SENSITIVITY)     EKG Vent. rate 68 BPM PR interval 202 ms QRS duration 122 ms QT/QTcB 478/508 ms P-R-T axes -9 -32 -37 Atrial-paced rhythm with premature ventricular or aberrantly conducted complexes Left axis deviation RSR' or QR pattern in V1 suggests right ventricular conduction delay Left ventricular hypertrophy with QRS widening ( R in aVL , Cornell product ) T wave abnormality, consider anterolateral ischemia   RADIOLOGY  CT head per radiologist was negative for any acute intracranial changes.   PROCEDURES:  Critical Care  performed:   Procedures   MEDICATIONS ORDERED IN ED: Medications - No data to display   IMPRESSION / MDM / ASSESSMENT AND PLAN / ED COURSE  I reviewed the triage vital signs and the nursing notes.   Differential diagnosis includes, but is not limited to, acute head injury, urinary tract infection, history of mechanical fall but consider syncopal episode.  89 year old female is brought to the ED by EMS after she was found at [facility] in the floor of the bathroom.  Daughter is here with the patient and states that they always find her in the bathroom.  This again was an unwitnessed fall as it was yesterday.  Imaging yesterday was negative.  Lab work as noted above with troponin 11, urinalysis negative, BMP and CBC essentially unchanged from previous visits.  A CT head was ordered to rule out an acute head injury for today which was reassuring and daughter was made aware.  Daughter states that she is currently working with PT and OT and encourages her mother to use her walker anytime she is up walking.  Is unclear whether she remembers to do this each time she is up.  She reportedly is to be assisted to the bathroom however daughter states that sometimes she gets up and goes to the bathroom and forgets to use the call light.  Patient was up ambulating to the restroom with her daughter while in the emergency department and had no difficulty with gait.  She was discharged with daughter taking her back to [facility] nursing facility.      Patient's presentation is most consistent with acute illness / injury with system symptoms.  FINAL CLINICAL IMPRESSION(S) / ED DIAGNOSES   Final diagnoses:  Fall, initial encounter     Rx / DC Orders   ED Discharge Orders     None        Note:  This document was prepared using Dragon voice recognition software and may include unintentional dictation errors.   Tommi Rumps, PA-C 06/21/23 1456    Willy Eddy, MD 06/21/23 1540

## 2023-06-21 NOTE — Telephone Encounter (Signed)
Aliyah from [facility] called in and stated that patient went out of the community yesterday due to a fall. She stated that today EMS was called due to patient having another fall. She is going to [facility].

## 2023-06-22 NOTE — Telephone Encounter (Signed)
Updated FL2 from in Dr. Ermalene Searing inbox for signature.

## 2023-06-22 NOTE — Telephone Encounter (Signed)
Called and spoke with patients daughter she stated she is needing some guidance from [place] on what to do regarding the patient and the falls. She stated the falls are only occurring when patient is in the bathroom and they have all been unwitnessed by staff. She is wanting to know if there are any other signs of decline that she should be aware of/ looking out for. Also would like to know if there is anything that can be done to make patient feel more comfortable at this point. If you would like for them to schedule appt to discuss patients daughter stated they can arrange that. She is aware Jae Dire is out of the office until week of 06/28/23.  She mentioned the [facility] has not started the patient on Lexapro yet because the prescription has to be placed on an FL2 form. Will work on getting this on to an FL2 from and get Dr. Ermalene Searing, supervising provider to sign due to Jae Dire being out of office.

## 2023-06-22 NOTE — Telephone Encounter (Signed)
Please notify [name], patient's daughter, that I recommend they consider palliative care or hospice. Palliative and hospice care is comfort care. With her being at [facility], I'm not sure how to arrange that. Can [facility] arrange palliative care?

## 2023-06-23 DIAGNOSIS — I447 Left bundle-branch block, unspecified: Secondary | ICD-10-CM | POA: Diagnosis not present

## 2023-06-23 DIAGNOSIS — Z96643 Presence of artificial hip joint, bilateral: Secondary | ICD-10-CM | POA: Diagnosis not present

## 2023-06-23 DIAGNOSIS — Z79899 Other long term (current) drug therapy: Secondary | ICD-10-CM | POA: Diagnosis not present

## 2023-06-23 DIAGNOSIS — I714 Abdominal aortic aneurysm, without rupture, unspecified: Secondary | ICD-10-CM | POA: Diagnosis not present

## 2023-06-23 DIAGNOSIS — D631 Anemia in chronic kidney disease: Secondary | ICD-10-CM | POA: Diagnosis not present

## 2023-06-23 DIAGNOSIS — J4489 Other specified chronic obstructive pulmonary disease: Secondary | ICD-10-CM | POA: Diagnosis not present

## 2023-06-23 DIAGNOSIS — Z8673 Personal history of transient ischemic attack (TIA), and cerebral infarction without residual deficits: Secondary | ICD-10-CM | POA: Diagnosis not present

## 2023-06-23 DIAGNOSIS — M879 Osteonecrosis, unspecified: Secondary | ICD-10-CM | POA: Diagnosis not present

## 2023-06-23 DIAGNOSIS — M1990 Unspecified osteoarthritis, unspecified site: Secondary | ICD-10-CM | POA: Diagnosis not present

## 2023-06-23 DIAGNOSIS — E039 Hypothyroidism, unspecified: Secondary | ICD-10-CM | POA: Diagnosis not present

## 2023-06-23 DIAGNOSIS — N189 Chronic kidney disease, unspecified: Secondary | ICD-10-CM | POA: Diagnosis not present

## 2023-06-23 DIAGNOSIS — Z7982 Long term (current) use of aspirin: Secondary | ICD-10-CM | POA: Diagnosis not present

## 2023-06-23 DIAGNOSIS — Z950 Presence of cardiac pacemaker: Secondary | ICD-10-CM | POA: Diagnosis not present

## 2023-06-23 DIAGNOSIS — G309 Alzheimer's disease, unspecified: Secondary | ICD-10-CM | POA: Diagnosis not present

## 2023-06-23 DIAGNOSIS — I442 Atrioventricular block, complete: Secondary | ICD-10-CM | POA: Diagnosis not present

## 2023-06-23 DIAGNOSIS — G43909 Migraine, unspecified, not intractable, without status migrainosus: Secondary | ICD-10-CM | POA: Diagnosis not present

## 2023-06-23 DIAGNOSIS — I2510 Atherosclerotic heart disease of native coronary artery without angina pectoris: Secondary | ICD-10-CM | POA: Diagnosis not present

## 2023-06-23 DIAGNOSIS — E785 Hyperlipidemia, unspecified: Secondary | ICD-10-CM | POA: Diagnosis not present

## 2023-06-23 DIAGNOSIS — Z952 Presence of prosthetic heart valve: Secondary | ICD-10-CM | POA: Diagnosis not present

## 2023-06-23 DIAGNOSIS — Z9181 History of falling: Secondary | ICD-10-CM | POA: Diagnosis not present

## 2023-06-23 DIAGNOSIS — I1310 Hypertensive heart and chronic kidney disease without heart failure, with stage 1 through stage 4 chronic kidney disease, or unspecified chronic kidney disease: Secondary | ICD-10-CM | POA: Diagnosis not present

## 2023-06-23 NOTE — Telephone Encounter (Signed)
Patients daughter called back in, reviewed Isabella Stalling message with her, she is in agreement to bring in palliative care to make the patient more comfortable.   Jae Dire- I spoke with Morrie Sheldon and she advised as the attending physician you will still need to place the referral for palliative care even with her being at [facility]. She stated [facility] is the agency that goes in [facility] to provide those services, so just note in the referral notes she is placed there.   I advised [name], patients daughter that she will be contacted by our referrals team within a week to get things set up. She is also going to notify [facility] that we are bringing in palliative care and find out their policies surrounding that.

## 2023-06-23 NOTE — Telephone Encounter (Signed)
Unable to reach patients daughter. Left voicemail to return call to our office.   

## 2023-06-24 ENCOUNTER — Other Ambulatory Visit: Payer: Self-pay

## 2023-06-24 ENCOUNTER — Emergency Department: Payer: Medicare Other

## 2023-06-24 ENCOUNTER — Emergency Department
Admission: EM | Admit: 2023-06-24 | Discharge: 2023-06-24 | Disposition: A | Discharge status: Home / Self Care | Payer: Medicare Other | Attending: Emergency Medicine | Admitting: Emergency Medicine

## 2023-06-24 DIAGNOSIS — I10 Essential (primary) hypertension: Secondary | ICD-10-CM | POA: Diagnosis not present

## 2023-06-24 DIAGNOSIS — F0280 Dementia in other diseases classified elsewhere without behavioral disturbance: Secondary | ICD-10-CM

## 2023-06-24 DIAGNOSIS — W19XXXA Unspecified fall, initial encounter: Secondary | ICD-10-CM | POA: Diagnosis not present

## 2023-06-24 DIAGNOSIS — I2510 Atherosclerotic heart disease of native coronary artery without angina pectoris: Secondary | ICD-10-CM | POA: Diagnosis not present

## 2023-06-24 DIAGNOSIS — N189 Chronic kidney disease, unspecified: Secondary | ICD-10-CM | POA: Diagnosis not present

## 2023-06-24 DIAGNOSIS — R519 Headache, unspecified: Secondary | ICD-10-CM | POA: Diagnosis not present

## 2023-06-24 DIAGNOSIS — G309 Alzheimer's disease, unspecified: Secondary | ICD-10-CM | POA: Insufficient documentation

## 2023-06-24 DIAGNOSIS — M47812 Spondylosis without myelopathy or radiculopathy, cervical region: Secondary | ICD-10-CM | POA: Diagnosis not present

## 2023-06-24 DIAGNOSIS — M542 Cervicalgia: Secondary | ICD-10-CM | POA: Diagnosis not present

## 2023-06-24 DIAGNOSIS — I129 Hypertensive chronic kidney disease with stage 1 through stage 4 chronic kidney disease, or unspecified chronic kidney disease: Secondary | ICD-10-CM | POA: Insufficient documentation

## 2023-06-24 DIAGNOSIS — Z743 Need for continuous supervision: Secondary | ICD-10-CM | POA: Diagnosis not present

## 2023-06-24 DIAGNOSIS — R6889 Other general symptoms and signs: Secondary | ICD-10-CM | POA: Diagnosis not present

## 2023-06-24 NOTE — ED Triage Notes (Signed)
Patient arrives from [facility] by EMS.  She fell in the activity room, and staff reported pain to the back of the head and right hip.  Patient has no complaints at this time.  Patient was able to get up from EMS stretcher and walk to ED stretcher with no difficulty.  No blood thinners.

## 2023-06-24 NOTE — Addendum Note (Signed)
Addended by: Doreene Nest on: 06/24/2023 02:27 PM   Modules accepted: Orders

## 2023-06-24 NOTE — ED Provider Notes (Signed)
[facility] Provider Note    Event Date/Time   First MD Initiated Contact with Patient 06/24/23 1940     (approximate)   History   Chief Complaint Fall   HPI  [name] is a 89 y.o. female with past medical history of hypertension, CAD, stroke, CKD, anemia, AAA, and Alzheimer's dementia who presents to the ED for fall.  Daughter reports she was told by patient's facility that she had lost her balance and fell backwards, striking her head.  It is unclear whether patient lost consciousness, she does not take any blood thinners beyond a baby aspirin.  Patient currently denies any complaints, does not recall the fall.  Patient was able to stand up and walk from the EMS stretcher to the ED stretcher.     Physical Exam   Triage Vital Signs: ED Triage Vitals  Encounter Vitals Group     BP 06/24/23 1932 (!) 156/60     Systolic BP Percentile --      Diastolic BP Percentile --      Pulse Rate 06/24/23 1932 68     Resp --      Temp 06/24/23 1932 98.4 F (36.9 C)     Temp Source 06/24/23 1932 Oral     SpO2 06/24/23 1932 98 %     Weight 06/24/23 1936 153 lb 14.1 oz (69.8 kg)     Height 06/24/23 1936 5\' 9"  (1.753 m)     Head Circumference --      Peak Flow --      Pain Score 06/24/23 1932 0     Pain Loc --      Pain Education --      Exclude from Growth Chart --     Most recent vital signs: Vitals:   06/24/23 2000 06/24/23 2030  BP: 127/68 131/61  Pulse: 72 67  Resp: 16   Temp:    SpO2:      Constitutional: Awake and alert. Eyes: Conjunctivae are normal. Head: Atraumatic. Nose: No congestion/rhinnorhea. Mouth/Throat: Mucous membranes are moist.  Neck: No midline cervical spine tenderness to palpation. Cardiovascular: Normal rate, regular rhythm. Grossly normal heart sounds.  2+ radial pulses bilaterally. Respiratory: Normal respiratory effort.  No retractions. Lungs CTAB.  No chest wall tenderness to palpation. Gastrointestinal: Soft  and nontender. No distention. Musculoskeletal: No lower extremity tenderness nor edema.  No upper extremity bony tenderness to palpation. Neurologic:  Normal speech and language. No gross focal neurologic deficits are appreciated.    ED Results / Procedures / Treatments   Labs (all labs ordered are listed, but only abnormal results are displayed) Labs Reviewed - No data to display   EKG  ED ECG REPORT I, Chesley Noon, the attending physician, personally viewed and interpreted this ECG.   Date: 06/24/2023  EKG Time: 19:41  Rate: 64  Rhythm: normal sinus rhythm  Axis: LAD  Intervals:right bundle branch block and left anterior fascicular block  ST&T Change: None  RADIOLOGY CT head reviewed and interpreted by me with no hemorrhage or midline shift.  PROCEDURES:  Critical Care performed: No  Procedures   MEDICATIONS ORDERED IN ED: Medications - No data to display   IMPRESSION / MDM / ASSESSMENT AND PLAN / ED COURSE  I reviewed the triage vital signs and the nursing notes.  89 y.o. female with past medical history of Alzheimer's dementia, hypertension, CAD, stroke, CKD, anemia, and AAA who presents to the ED following witnessed fall at her nursing facility earlier this evening.  Patient's presentation is most consistent with acute complicated illness / injury requiring diagnostic workup.  Differential diagnosis includes, but is not limited to, intracranial injury, cervical spine injury, UTI, electrolyte abnormality.  Patient nontoxic-appearing and in no acute distress, vital signs are unremarkable.  She is awake and alert with no focal neurologic deficits on exam, no obvious signs of trauma but given limited history will check CT head and cervical spine.  No evidence of trauma to her trunk or extremities.  Daughter does report that patient has been declining recently and has had multiple falls this week.  She was previously seen in the ED for  this 3 days ago, with labs and urinalysis at that time being unremarkable.  Daughter interested in CT imaging but declines when offered repeat labs and urinalysis.  Daughter states that she has been in discussion with patient's PCP regarding transition to hospice care, which seems reasonable given advanced dementia.  CT head and cervical spine are negative for acute process, patient appropriate for discharge home with PCP follow-up.  She was counseled to return to the ED for new or worsening symptoms.  Patient agrees with plan.      FINAL CLINICAL IMPRESSION(S) / ED DIAGNOSES   Final diagnoses:  Fall, initial encounter  Alzheimer's dementia without behavioral disturbance, psychotic disturbance, mood disturbance, or anxiety, unspecified dementia severity, unspecified timing of dementia onset (HCC)     Rx / DC Orders   ED Discharge Orders     None        Note:  This document was prepared using Dragon voice recognition software and may include unintentional dictation errors.   Chesley Noon, MD 06/24/23 2055

## 2023-06-24 NOTE — Telephone Encounter (Signed)
Noted, referral placed.  

## 2023-06-28 ENCOUNTER — Telehealth: Payer: Self-pay | Admitting: Primary Care

## 2023-06-28 DIAGNOSIS — Z7982 Long term (current) use of aspirin: Secondary | ICD-10-CM | POA: Diagnosis not present

## 2023-06-28 DIAGNOSIS — Z950 Presence of cardiac pacemaker: Secondary | ICD-10-CM | POA: Diagnosis not present

## 2023-06-28 DIAGNOSIS — I442 Atrioventricular block, complete: Secondary | ICD-10-CM | POA: Diagnosis not present

## 2023-06-28 DIAGNOSIS — Z96643 Presence of artificial hip joint, bilateral: Secondary | ICD-10-CM | POA: Diagnosis not present

## 2023-06-28 DIAGNOSIS — Z952 Presence of prosthetic heart valve: Secondary | ICD-10-CM | POA: Diagnosis not present

## 2023-06-28 DIAGNOSIS — M1990 Unspecified osteoarthritis, unspecified site: Secondary | ICD-10-CM | POA: Diagnosis not present

## 2023-06-28 DIAGNOSIS — E039 Hypothyroidism, unspecified: Secondary | ICD-10-CM | POA: Diagnosis not present

## 2023-06-28 DIAGNOSIS — J4489 Other specified chronic obstructive pulmonary disease: Secondary | ICD-10-CM | POA: Diagnosis not present

## 2023-06-28 DIAGNOSIS — I714 Abdominal aortic aneurysm, without rupture, unspecified: Secondary | ICD-10-CM | POA: Diagnosis not present

## 2023-06-28 DIAGNOSIS — Z9181 History of falling: Secondary | ICD-10-CM | POA: Diagnosis not present

## 2023-06-28 DIAGNOSIS — G309 Alzheimer's disease, unspecified: Secondary | ICD-10-CM | POA: Diagnosis not present

## 2023-06-28 DIAGNOSIS — D631 Anemia in chronic kidney disease: Secondary | ICD-10-CM | POA: Diagnosis not present

## 2023-06-28 DIAGNOSIS — Z8673 Personal history of transient ischemic attack (TIA), and cerebral infarction without residual deficits: Secondary | ICD-10-CM | POA: Diagnosis not present

## 2023-06-28 DIAGNOSIS — Z79899 Other long term (current) drug therapy: Secondary | ICD-10-CM | POA: Diagnosis not present

## 2023-06-28 DIAGNOSIS — I1310 Hypertensive heart and chronic kidney disease without heart failure, with stage 1 through stage 4 chronic kidney disease, or unspecified chronic kidney disease: Secondary | ICD-10-CM | POA: Diagnosis not present

## 2023-06-28 DIAGNOSIS — I2510 Atherosclerotic heart disease of native coronary artery without angina pectoris: Secondary | ICD-10-CM | POA: Diagnosis not present

## 2023-06-28 DIAGNOSIS — I447 Left bundle-branch block, unspecified: Secondary | ICD-10-CM | POA: Diagnosis not present

## 2023-06-28 DIAGNOSIS — E785 Hyperlipidemia, unspecified: Secondary | ICD-10-CM | POA: Diagnosis not present

## 2023-06-28 DIAGNOSIS — M879 Osteonecrosis, unspecified: Secondary | ICD-10-CM | POA: Diagnosis not present

## 2023-06-28 DIAGNOSIS — N189 Chronic kidney disease, unspecified: Secondary | ICD-10-CM | POA: Diagnosis not present

## 2023-06-28 DIAGNOSIS — G43909 Migraine, unspecified, not intractable, without status migrainosus: Secondary | ICD-10-CM | POA: Diagnosis not present

## 2023-06-28 NOTE — Telephone Encounter (Signed)
Called and spoke with Victorino Dike at [facility], she advised they needed a signed order faxed over to get palliative care set up.  Faxed over signed order and updated FL2 form ( to reflect addition of lexapro).  Nothing further needed at this time

## 2023-06-28 NOTE — Telephone Encounter (Signed)
[name] from [facility] called stating that they received a call form [facility] stating that they need a palliative care order in their system in order to set her up ,she said that they do not have any documentation of this order and is wondering where it came from. She was actually looking in to getting an regular hospice care order set up.She is requesting a phone call to discuss.

## 2023-06-29 ENCOUNTER — Telehealth: Payer: Self-pay | Admitting: Primary Care

## 2023-06-29 DIAGNOSIS — Z79899 Other long term (current) drug therapy: Secondary | ICD-10-CM | POA: Diagnosis not present

## 2023-06-29 DIAGNOSIS — J4489 Other specified chronic obstructive pulmonary disease: Secondary | ICD-10-CM | POA: Diagnosis not present

## 2023-06-29 DIAGNOSIS — G309 Alzheimer's disease, unspecified: Secondary | ICD-10-CM | POA: Diagnosis not present

## 2023-06-29 DIAGNOSIS — E785 Hyperlipidemia, unspecified: Secondary | ICD-10-CM | POA: Diagnosis not present

## 2023-06-29 DIAGNOSIS — I714 Abdominal aortic aneurysm, without rupture, unspecified: Secondary | ICD-10-CM | POA: Diagnosis not present

## 2023-06-29 DIAGNOSIS — Z7982 Long term (current) use of aspirin: Secondary | ICD-10-CM | POA: Diagnosis not present

## 2023-06-29 DIAGNOSIS — Z96643 Presence of artificial hip joint, bilateral: Secondary | ICD-10-CM | POA: Diagnosis not present

## 2023-06-29 DIAGNOSIS — Z950 Presence of cardiac pacemaker: Secondary | ICD-10-CM | POA: Diagnosis not present

## 2023-06-29 DIAGNOSIS — M1990 Unspecified osteoarthritis, unspecified site: Secondary | ICD-10-CM | POA: Diagnosis not present

## 2023-06-29 DIAGNOSIS — Z952 Presence of prosthetic heart valve: Secondary | ICD-10-CM | POA: Diagnosis not present

## 2023-06-29 DIAGNOSIS — G43909 Migraine, unspecified, not intractable, without status migrainosus: Secondary | ICD-10-CM | POA: Diagnosis not present

## 2023-06-29 DIAGNOSIS — Z8673 Personal history of transient ischemic attack (TIA), and cerebral infarction without residual deficits: Secondary | ICD-10-CM | POA: Diagnosis not present

## 2023-06-29 DIAGNOSIS — I2510 Atherosclerotic heart disease of native coronary artery without angina pectoris: Secondary | ICD-10-CM | POA: Diagnosis not present

## 2023-06-29 DIAGNOSIS — N189 Chronic kidney disease, unspecified: Secondary | ICD-10-CM | POA: Diagnosis not present

## 2023-06-29 DIAGNOSIS — I442 Atrioventricular block, complete: Secondary | ICD-10-CM | POA: Diagnosis not present

## 2023-06-29 DIAGNOSIS — D631 Anemia in chronic kidney disease: Secondary | ICD-10-CM | POA: Diagnosis not present

## 2023-06-29 DIAGNOSIS — M879 Osteonecrosis, unspecified: Secondary | ICD-10-CM | POA: Diagnosis not present

## 2023-06-29 DIAGNOSIS — Z9181 History of falling: Secondary | ICD-10-CM | POA: Diagnosis not present

## 2023-06-29 DIAGNOSIS — I447 Left bundle-branch block, unspecified: Secondary | ICD-10-CM | POA: Diagnosis not present

## 2023-06-29 DIAGNOSIS — I1310 Hypertensive heart and chronic kidney disease without heart failure, with stage 1 through stage 4 chronic kidney disease, or unspecified chronic kidney disease: Secondary | ICD-10-CM | POA: Diagnosis not present

## 2023-06-29 DIAGNOSIS — E039 Hypothyroidism, unspecified: Secondary | ICD-10-CM | POA: Diagnosis not present

## 2023-06-29 NOTE — Telephone Encounter (Signed)
Home Health verbal orders Caller Name:CONNIE Agency Name: [facility]  Callback number: [phone]  Requesting OT/PT/Skilled nursing/Social Work/Speech: OT EXTENSION  Reason:FALL 2 DAYS IN A ROW  Frequency:1 WK FOR 8 WK  Please forward to [employer] pool or providers CMA  SINCE FALL PATIENT HAS BEEN COMPLAINING OF PAIN TO HER RIGHT HIP

## 2023-06-30 NOTE — Telephone Encounter (Signed)
Called left information for verbal on secured voicemail. Requested call back if any questions

## 2023-06-30 NOTE — Telephone Encounter (Signed)
Approved.  

## 2023-07-04 DIAGNOSIS — I1310 Hypertensive heart and chronic kidney disease without heart failure, with stage 1 through stage 4 chronic kidney disease, or unspecified chronic kidney disease: Secondary | ICD-10-CM | POA: Diagnosis not present

## 2023-07-04 DIAGNOSIS — E785 Hyperlipidemia, unspecified: Secondary | ICD-10-CM | POA: Diagnosis not present

## 2023-07-04 DIAGNOSIS — Z8673 Personal history of transient ischemic attack (TIA), and cerebral infarction without residual deficits: Secondary | ICD-10-CM | POA: Diagnosis not present

## 2023-07-04 DIAGNOSIS — I447 Left bundle-branch block, unspecified: Secondary | ICD-10-CM | POA: Diagnosis not present

## 2023-07-04 DIAGNOSIS — M879 Osteonecrosis, unspecified: Secondary | ICD-10-CM | POA: Diagnosis not present

## 2023-07-04 DIAGNOSIS — I252 Old myocardial infarction: Secondary | ICD-10-CM | POA: Diagnosis not present

## 2023-07-04 DIAGNOSIS — Z79899 Other long term (current) drug therapy: Secondary | ICD-10-CM | POA: Diagnosis not present

## 2023-07-04 DIAGNOSIS — N189 Chronic kidney disease, unspecified: Secondary | ICD-10-CM | POA: Diagnosis not present

## 2023-07-04 DIAGNOSIS — E039 Hypothyroidism, unspecified: Secondary | ICD-10-CM | POA: Diagnosis not present

## 2023-07-04 DIAGNOSIS — G309 Alzheimer's disease, unspecified: Secondary | ICD-10-CM | POA: Diagnosis not present

## 2023-07-04 DIAGNOSIS — G43909 Migraine, unspecified, not intractable, without status migrainosus: Secondary | ICD-10-CM | POA: Diagnosis not present

## 2023-07-04 DIAGNOSIS — I442 Atrioventricular block, complete: Secondary | ICD-10-CM | POA: Diagnosis not present

## 2023-07-04 DIAGNOSIS — Z952 Presence of prosthetic heart valve: Secondary | ICD-10-CM | POA: Diagnosis not present

## 2023-07-04 DIAGNOSIS — I714 Abdominal aortic aneurysm, without rupture, unspecified: Secondary | ICD-10-CM | POA: Diagnosis not present

## 2023-07-04 DIAGNOSIS — M1990 Unspecified osteoarthritis, unspecified site: Secondary | ICD-10-CM | POA: Diagnosis not present

## 2023-07-04 DIAGNOSIS — I2510 Atherosclerotic heart disease of native coronary artery without angina pectoris: Secondary | ICD-10-CM | POA: Diagnosis not present

## 2023-07-04 DIAGNOSIS — D631 Anemia in chronic kidney disease: Secondary | ICD-10-CM | POA: Diagnosis not present

## 2023-07-04 DIAGNOSIS — Z7982 Long term (current) use of aspirin: Secondary | ICD-10-CM | POA: Diagnosis not present

## 2023-07-04 DIAGNOSIS — Z9181 History of falling: Secondary | ICD-10-CM | POA: Diagnosis not present

## 2023-07-04 DIAGNOSIS — J4489 Other specified chronic obstructive pulmonary disease: Secondary | ICD-10-CM | POA: Diagnosis not present

## 2023-07-04 DIAGNOSIS — Z950 Presence of cardiac pacemaker: Secondary | ICD-10-CM | POA: Diagnosis not present

## 2023-07-05 DIAGNOSIS — Z515 Encounter for palliative care: Secondary | ICD-10-CM | POA: Diagnosis not present

## 2023-07-06 ENCOUNTER — Encounter: Payer: Self-pay | Admitting: Primary Care

## 2023-07-06 ENCOUNTER — Ambulatory Visit (INDEPENDENT_AMBULATORY_CARE_PROVIDER_SITE_OTHER): Payer: Medicare Other | Admitting: Primary Care

## 2023-07-06 VITALS — BP 146/78 | HR 60 | Temp 98.2°F | Ht 69.0 in | Wt 146.0 lb

## 2023-07-06 DIAGNOSIS — J4489 Other specified chronic obstructive pulmonary disease: Secondary | ICD-10-CM | POA: Diagnosis not present

## 2023-07-06 DIAGNOSIS — I714 Abdominal aortic aneurysm, without rupture, unspecified: Secondary | ICD-10-CM | POA: Diagnosis not present

## 2023-07-06 DIAGNOSIS — I447 Left bundle-branch block, unspecified: Secondary | ICD-10-CM | POA: Diagnosis not present

## 2023-07-06 DIAGNOSIS — G309 Alzheimer's disease, unspecified: Secondary | ICD-10-CM | POA: Diagnosis not present

## 2023-07-06 DIAGNOSIS — Z952 Presence of prosthetic heart valve: Secondary | ICD-10-CM | POA: Diagnosis not present

## 2023-07-06 DIAGNOSIS — I1310 Hypertensive heart and chronic kidney disease without heart failure, with stage 1 through stage 4 chronic kidney disease, or unspecified chronic kidney disease: Secondary | ICD-10-CM | POA: Diagnosis not present

## 2023-07-06 DIAGNOSIS — D631 Anemia in chronic kidney disease: Secondary | ICD-10-CM | POA: Diagnosis not present

## 2023-07-06 DIAGNOSIS — I252 Old myocardial infarction: Secondary | ICD-10-CM | POA: Diagnosis not present

## 2023-07-06 DIAGNOSIS — Z79899 Other long term (current) drug therapy: Secondary | ICD-10-CM | POA: Diagnosis not present

## 2023-07-06 DIAGNOSIS — F03B0 Unspecified dementia, moderate, without behavioral disturbance, psychotic disturbance, mood disturbance, and anxiety: Secondary | ICD-10-CM

## 2023-07-06 DIAGNOSIS — R296 Repeated falls: Secondary | ICD-10-CM | POA: Insufficient documentation

## 2023-07-06 DIAGNOSIS — Z7982 Long term (current) use of aspirin: Secondary | ICD-10-CM | POA: Diagnosis not present

## 2023-07-06 DIAGNOSIS — E785 Hyperlipidemia, unspecified: Secondary | ICD-10-CM | POA: Diagnosis not present

## 2023-07-06 DIAGNOSIS — N189 Chronic kidney disease, unspecified: Secondary | ICD-10-CM | POA: Diagnosis not present

## 2023-07-06 DIAGNOSIS — G43909 Migraine, unspecified, not intractable, without status migrainosus: Secondary | ICD-10-CM | POA: Diagnosis not present

## 2023-07-06 DIAGNOSIS — M879 Osteonecrosis, unspecified: Secondary | ICD-10-CM | POA: Diagnosis not present

## 2023-07-06 DIAGNOSIS — Z8673 Personal history of transient ischemic attack (TIA), and cerebral infarction without residual deficits: Secondary | ICD-10-CM | POA: Diagnosis not present

## 2023-07-06 DIAGNOSIS — E039 Hypothyroidism, unspecified: Secondary | ICD-10-CM | POA: Diagnosis not present

## 2023-07-06 DIAGNOSIS — M1990 Unspecified osteoarthritis, unspecified site: Secondary | ICD-10-CM | POA: Diagnosis not present

## 2023-07-06 DIAGNOSIS — Z950 Presence of cardiac pacemaker: Secondary | ICD-10-CM | POA: Diagnosis not present

## 2023-07-06 DIAGNOSIS — Z9181 History of falling: Secondary | ICD-10-CM | POA: Diagnosis not present

## 2023-07-06 DIAGNOSIS — I2510 Atherosclerotic heart disease of native coronary artery without angina pectoris: Secondary | ICD-10-CM | POA: Diagnosis not present

## 2023-07-06 DIAGNOSIS — I442 Atrioventricular block, complete: Secondary | ICD-10-CM | POA: Diagnosis not present

## 2023-07-06 NOTE — Progress Notes (Signed)
Subjective:    Patient ID: [name], female    DOB: 1934/10/11, 89 y.o.   MRN: [id number]  HPI  [name] is a very pleasant 89 y.o. female with a history of CAD, hypertension, CVA, heart block with pacemaker, hypothyroidism, dementia, left hip fracture, CKD who presents today with her daughter for ED follow-up.  She presented to [facility] ED on 06/24/2023 after a fall.  She resides in a nursing facility.  She fell backwards striking her head, uncertain if she lost consciousness.  During this visit she underwent CT head and cervical spine which were negative.  CBC revealed hemoglobin of 9.4 and hematocrit of 28.2.  Urinalysis was negative for acute cystitis.  Today she denies headaches and dizziness. She's noticed right and left hip and lower extremity pain. She denies rectal and vaginal bleeding, abdominal pain. Her appetite is about the same.   She's had multiple falls with multiple ED visits, falls mostly occurring in the bathroom.   Evaluated by palliative care who recommended hospice care. Her daughter is ready to proceed with hospice care.   BP Readings from Last 3 Encounters:  07/06/23 (!) 146/78  06/24/23 131/61  06/21/23 (!) 140/60   Wt Readings from Last 3 Encounters:  07/06/23 146 lb (66.2 kg)  06/24/23 153 lb 14.1 oz (69.8 kg)  06/21/23 145 lb 1 oz (65.8 kg)      Review of Systems  Constitutional:  Negative for fever.  Musculoskeletal:  Positive for arthralgias.  Neurological:  Negative for dizziness and headaches.         Past Medical History:  Diagnosis Date   AAA (abdominal aortic aneurysm) (HCC) 01/03/13   3.5x3.3cm   Alzheimer's dementia (HCC)    "probably middle stage" (11/05/2014)   Anemia    "hx of chronic" (11/05/2014)   Aortic stenosis 03/29/2008   biological prosthetic replacement   Arthritis    "joints" (11/05/2014)   Asthma    Avascular necrosis of bone of hip, right (HCC) 07/28/2018   Chronic asthmatic bronchitis    "q fall and  winter" (11/05/2014)   Closed nondisplaced fracture of middle phalanx of lesser toe of right foot 01/05/2021   Coronary artery disease    CABG 03/29/08   Dyslipidemia    Exertional shortness of breath    Family history of adverse reaction to anesthesia    "daughter gets PONV too"   Heart murmur    Hypertension    Hypothyroidism    LBBB (left bundle branch block)    Migraines    "none in years' (11/05/2014)   Mobitz type 2 second degree AV block 10/31/2014   MVP (mitral valve prolapse)    with mild mitral insufficiency/notes 08/17/2013   Near syncope 08/17/2013   PONV (postoperative nausea and vomiting)    Presence of permanent cardiac pacemaker    Sinus pause 10/31/2014   Stroke (HCC) 03/2008   "2 light strokes"; denies residual on 11/05/2014   Urinary frequency     Social History   Socioeconomic History   Marital status: Divorced    Spouse name: Not on file   Number of children: 1   Years of education: Some college   Highest education level: Not on file  Occupational History   Occupation: Retired  Tobacco Use   Smoking status: Never   Smokeless tobacco: Never  Vaping Use   Vaping status: Never Used  Substance and Sexual Activity   Alcohol use: No    Alcohol/week: 0.0 standard drinks  of alcohol   Drug use: No   Sexual activity: Not Currently  Other Topics Concern   Not on file  Social History Narrative   Pt lives in single story home with her daughter and son-in-law   Has 1 child   Some college education   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 [employer]    Social Determinants of Health   Financial Resource Strain: Low Risk  (06/26/2019)   Overall Financial Resource Strain (CARDIA)    Difficulty of Paying Living Expenses: Not hard at all  Food Insecurity: No Food Insecurity (01/14/2023)   Hunger Vital Sign    Worried About Running Out of Food in the Last Year: Never true    Ran Out of Food in the Last Year: Never true  Transportation Needs: No  Transportation Needs (01/14/2023)   PRAPARE - Administrator, Civil Service (Medical): No    Lack of Transportation (Non-Medical): No  Physical Activity: Inactive (06/26/2019)   Exercise Vital Sign    Days of Exercise per Week: 0 days    Minutes of Exercise per Session: 0 min  Stress: No Stress Concern Present (06/26/2019)   [employer] of Occupational Health - Occupational Stress Questionnaire    Feeling of Stress : Not at all  Social Connections: Not on file  Intimate Partner Violence: Not At Risk (10/30/2022)   Humiliation, Afraid, Rape, and Kick questionnaire    Fear of Current or Ex-Partner: No    Emotionally Abused: No    Physically Abused: No    Sexually Abused: No    Past Surgical History:  Procedure Laterality Date   ABDOMINAL HYSTERECTOMY  1977   AORTIC VALVE REPLACEMENT  03/29/2008   PERICARDIAL TISSUE VALVE   APPENDECTOMY  ?1977   CARDIAC CATHETERIZATION  ~ 2009   CATARACT EXTRACTION W/ INTRAOCULAR LENS IMPLANT Left    CORONARY ARTERY BYPASS GRAFT  03/29/2008   LIMA TO LAD,SVG TO CX,SVG TO LEFT POSTEROLATERAL BRANCH   HIP FRACTURE SURGERY Right 06/2010   "3 metal screws"    INSERT / REPLACE / REMOVE PACEMAKER  11/05/2014   LOOP RECORDER EXPLANT N/A 11/05/2014   Procedure: LOOP RECORDER EXPLANT;  Surgeon: [name], MD;  Location: [facility] CATH [facility];  Service: Cardiovascular;  Laterality: N/A;   LOOP RECORDER IMPLANT N/A 09/17/2013   Procedure: LOOP RECORDER IMPLANT;  Surgeon: [name], MD;  Location: [facility] CATH [facility];  Service: Cardiovascular;  Laterality: N/A;   PERMANENT PACEMAKER INSERTION N/A 11/05/2014   Procedure: PERMANENT PACEMAKER INSERTION;  Surgeon: [name], MD;  Location: [facility] CATH [facility];  Service: Cardiovascular;  Laterality: N/A;   REFRACTIVE SURGERY Bilateral    /notes 04/28/2008 (08/17/2013)   RETINAL DETACHMENT SURGERY Left 1994   [name] 04/10/2008 (08/17/2013)   TIBIAL TUBERCLERPLASTY  11/05/2014   tooth pulled   06/2018   TOTAL HIP  ARTHROPLASTY Right 07/31/2018   Procedure: RIGHT TOTAL HIP ARTHROPLASTY ANTERIOR APPROACH;  Surgeon: Gean Birchwood, MD;  Location: [facility] ORS;  Service: Orthopedics;  Laterality: Right;   TOTAL HIP ARTHROPLASTY Left 10/31/2022   Procedure: TOTAL HIP ARTHROPLASTY ANTERIOR APPROACH;  Surgeon: Durene Romans, MD;  Location: [facility] OR;  Service: Orthopedics;  Laterality: Left;   UTERINE FIBROID SURGERY  1960's    Family History  Problem Relation Age of Onset   Heart failure Mother    Diabetes Mother    Hypertension Mother    Hyperlipidemia Mother    Melanoma Grandchild    Colon cancer Neg Hx  Allergies  Allergen Reactions   Codeine Nausea And Vomiting and Other (See Comments)    Patient gets violently ill   Morphine And Codeine Nausea And Vomiting and Other (See Comments)    Patient get violently ill    Current Outpatient Medications on File Prior to Visit  Medication Sig Dispense Refill   alendronate (FOSAMAX) 70 MG tablet TAKE 1 TABLET BY MOUTH ONCE A WEEK ON SUNDAYS. NO FOOD OR OTHER MEDICATIONS FOR 30 MIN. AVOID LAYING FLAT FOR 2 HOURS. 12 tablet 1   atorvastatin (LIPITOR) 40 MG tablet TAKE 1 TABLET BY MOUTH DAILY FOR CHOLESTEROL (Patient taking differently: Take 40 mg by mouth daily.) 90 tablet 2   brimonidine (ALPHAGAN) 0.2 % ophthalmic solution Place 1 drop into the left eye 2 (two) times daily.  3   Calcium Carbonate-Vit D-Min (CALTRATE 600+D PLUS MINERALS) 600-800 MG-UNIT TABS Take 1 tablet by mouth twice daily for calcium and vitamin D. 180 tablet 3   docusate sodium (COLACE) 100 MG capsule TAKE 1 CAPSULE BY MOUTH TWICE A DAY AS NEEDED FOR MILD CONSTIPATION 180 capsule 0   escitalopram (LEXAPRO) 5 MG tablet Take 1 tablet (5 mg total) by mouth daily. For anxiety and agitation 90 tablet 0   fluticasone (FLONASE) 50 MCG/ACT nasal spray PLACE 1 SPRAY INTO BOTH NOSTRILS DAILY 48 g 3   fluticasone-salmeterol (ADVAIR HFA) 45-21 MCG/ACT inhaler INHALE 1 PUFF INTO THE LUNGS TWICE A DAY 12 g 5    furosemide (LASIX) 20 MG tablet Take 1 tablet (20 mg total) by mouth daily. 90 tablet 3   levothyroxine (SYNTHROID) 50 MCG tablet TAKE 1 TABLET BY MOUTH EVERY MORNING ON AN EMPTY STOMACH WITH WATER ONLY. NO FOOD OR OTHER MEDICATIONS FOR 30 MINS. (Patient taking differently: Take 50 mcg by mouth daily before breakfast. TAKE 1 TABLET BY MOUTH EVERY MORNING ON AN EMPTY STOMACH WITH WATER ONLY. NO FOOD OR OTHER MEDICATIONS FOR 30 MINS.) 90 tablet 3   losartan (COZAAR) 50 MG tablet Take 1 tablet (50 mg total) by mouth daily. for blood pressure 90 tablet 3   potassium chloride (KLOR-CON M) 10 MEQ tablet 2 tablets by mouth once a day 180 tablet 3   No current facility-administered medications on file prior to visit.    BP (!) 146/78   Pulse 60   Temp 98.2 F (36.8 C) (Temporal)   Ht 5\' 9"  (1.753 m)   Wt 146 lb (66.2 kg)   SpO2 98%   BMI 21.56 kg/m  Objective:   Physical Exam Cardiovascular:     Rate and Rhythm: Normal rate and regular rhythm.  Pulmonary:     Effort: Pulmonary effort is normal.     Breath sounds: Normal breath sounds.  Musculoskeletal:     Cervical back: Neck supple.  Skin:    General: Skin is warm and dry.     Comments: Large hematoma to right buttocks and sacral region. Dark green, red, and blue bruising in various stages of healing. Skin intact.           Assessment & Plan:  Moderate dementia without behavioral disturbance, psychotic disturbance, mood disturbance, or anxiety, unspecified dementia type [facility]) Assessment & Plan: Progressing.  Long discussion today with patient and daughter regarding care moving forward. Both agree that hospice is next step.   Referral placed for hospice care.  Daughter and patient decline further labs at this time.   Orders: -     Ambulatory referral to Hospice  Recurrent falls Assessment &  Plan: Without major injury. Reviewed recent ED notes, labs, imaging.  Offered to repeat CBC, patient and daughter decline today  as the plan is to move forward with hopsice.  Referral placed for hospice services.          Doreene Nest, NP

## 2023-07-06 NOTE — Assessment & Plan Note (Signed)
Progressing.  Long discussion today with patient and daughter regarding care moving forward. Both agree that hospice is next step.   Referral placed for hospice care.  Daughter and patient decline further labs at this time.

## 2023-07-06 NOTE — Patient Instructions (Signed)
You will either be contacted via phone regarding your referral to hospice, or you may receive a letter on your MyChart portal from our referral team with instructions for scheduling an appointment. Please let us know if you have not been contacted by anyone within two weeks.  Stop your aspirin.  It was a pleasure to see you today!

## 2023-07-06 NOTE — Assessment & Plan Note (Signed)
Without major injury. Reviewed recent ED notes, labs, imaging.  Offered to repeat CBC, patient and daughter decline today as the plan is to move forward with hopsice.  Referral placed for hospice services.

## 2023-07-07 ENCOUNTER — Telehealth: Payer: Self-pay | Admitting: Primary Care

## 2023-07-07 NOTE — Telephone Encounter (Signed)
Please have hospice doctor with [facility] follow patient.

## 2023-07-07 NOTE — Telephone Encounter (Signed)
Left message with [name] from [facility] to return call to office.

## 2023-07-07 NOTE — Telephone Encounter (Signed)
[name] from [facility] called back returning [facility] call. Told her Clark's response, [name] had no question/concerns. Call back # [phone]

## 2023-07-07 NOTE — Telephone Encounter (Signed)
[name] from [name] called stating they received a request for hospice for pt from the facility she resides at. [facility] asked if [name] would serve as attending provider? Call back # [phone]

## 2023-07-09 ENCOUNTER — Other Ambulatory Visit: Payer: Self-pay | Admitting: Primary Care

## 2023-07-09 DIAGNOSIS — K5900 Constipation, unspecified: Secondary | ICD-10-CM

## 2023-08-16 DIAGNOSIS — Z515 Encounter for palliative care: Secondary | ICD-10-CM | POA: Diagnosis not present

## 2023-08-23 ENCOUNTER — Other Ambulatory Visit: Payer: Self-pay | Admitting: Primary Care

## 2023-08-23 DIAGNOSIS — M8580 Other specified disorders of bone density and structure, unspecified site: Secondary | ICD-10-CM

## 2023-08-29 ENCOUNTER — Ambulatory Visit (INDEPENDENT_AMBULATORY_CARE_PROVIDER_SITE_OTHER)

## 2023-08-29 DIAGNOSIS — I441 Atrioventricular block, second degree: Secondary | ICD-10-CM

## 2023-08-29 LAB — CUP PACEART REMOTE DEVICE CHECK
Battery Impedance: 791 Ohm
Battery Remaining Longevity: 87 mo
Battery Voltage: 2.77 V
Brady Statistic AP VP Percent: 0 %
Brady Statistic AP VS Percent: 19 %
Brady Statistic AS VP Percent: 0 %
Brady Statistic AS VS Percent: 80 %
Date Time Interrogation Session: 20241104151831
Implantable Lead Connection Status: 753985
Implantable Lead Connection Status: 753985
Implantable Lead Implant Date: 20160112
Implantable Lead Implant Date: 20160112
Implantable Lead Location: 753859
Implantable Lead Location: 753860
Implantable Lead Model: 5076
Implantable Lead Model: 5076
Implantable Pulse Generator Implant Date: 20160112
Lead Channel Impedance Value: 460 Ohm
Lead Channel Impedance Value: 504 Ohm
Lead Channel Pacing Threshold Amplitude: 0.75 V
Lead Channel Pacing Threshold Amplitude: 0.875 V
Lead Channel Pacing Threshold Pulse Width: 0.4 ms
Lead Channel Pacing Threshold Pulse Width: 0.4 ms
Lead Channel Setting Pacing Amplitude: 1.5 V
Lead Channel Setting Pacing Amplitude: 2 V
Lead Channel Setting Pacing Pulse Width: 0.4 ms
Lead Channel Setting Sensing Sensitivity: 2 mV
Zone Setting Status: 755011
Zone Setting Status: 755011

## 2023-09-05 ENCOUNTER — Other Ambulatory Visit: Payer: Self-pay | Admitting: Primary Care

## 2023-09-05 DIAGNOSIS — F03911 Unspecified dementia, unspecified severity, with agitation: Secondary | ICD-10-CM

## 2023-09-19 ENCOUNTER — Encounter: Payer: Self-pay | Admitting: Cardiovascular Disease

## 2023-09-19 NOTE — Progress Notes (Signed)
Remote pacemaker transmission.   

## 2023-11-14 ENCOUNTER — Other Ambulatory Visit: Payer: Self-pay | Admitting: Primary Care

## 2023-11-14 DIAGNOSIS — E039 Hypothyroidism, unspecified: Secondary | ICD-10-CM

## 2023-11-14 NOTE — Telephone Encounter (Signed)
Please call patient's daughter:  We never saw her in 2024 for her annual check up. This is because of her falls, etc. Can we get her in the office for updated labs? It's been a while since we've checked thyroid, blood sugar etc. No rush, just sometime within the next month.

## 2023-11-16 ENCOUNTER — Other Ambulatory Visit: Payer: Self-pay | Admitting: Primary Care

## 2023-11-16 DIAGNOSIS — K5900 Constipation, unspecified: Secondary | ICD-10-CM

## 2023-12-05 DIAGNOSIS — I872 Venous insufficiency (chronic) (peripheral): Secondary | ICD-10-CM | POA: Diagnosis not present

## 2023-12-05 DIAGNOSIS — I471 Supraventricular tachycardia, unspecified: Secondary | ICD-10-CM | POA: Diagnosis not present

## 2023-12-05 DIAGNOSIS — I2510 Atherosclerotic heart disease of native coronary artery without angina pectoris: Secondary | ICD-10-CM | POA: Diagnosis not present

## 2023-12-05 DIAGNOSIS — I714 Abdominal aortic aneurysm, without rupture, unspecified: Secondary | ICD-10-CM | POA: Diagnosis not present

## 2023-12-05 DIAGNOSIS — R238 Other skin changes: Secondary | ICD-10-CM | POA: Diagnosis not present

## 2023-12-05 DIAGNOSIS — I341 Nonrheumatic mitral (valve) prolapse: Secondary | ICD-10-CM | POA: Diagnosis not present

## 2023-12-05 DIAGNOSIS — M6281 Muscle weakness (generalized): Secondary | ICD-10-CM | POA: Diagnosis not present

## 2023-12-05 DIAGNOSIS — R262 Difficulty in walking, not elsewhere classified: Secondary | ICD-10-CM | POA: Diagnosis not present

## 2023-12-05 DIAGNOSIS — B351 Tinea unguium: Secondary | ICD-10-CM | POA: Diagnosis not present

## 2023-12-05 DIAGNOSIS — N189 Chronic kidney disease, unspecified: Secondary | ICD-10-CM | POA: Diagnosis not present

## 2024-01-02 ENCOUNTER — Other Ambulatory Visit: Payer: Self-pay | Admitting: Cardiovascular Disease

## 2024-01-02 DIAGNOSIS — I10 Essential (primary) hypertension: Secondary | ICD-10-CM

## 2024-01-12 ENCOUNTER — Encounter: Payer: Medicare Other | Admitting: Cardiovascular Disease

## 2024-05-28 DIAGNOSIS — E039 Hypothyroidism, unspecified: Secondary | ICD-10-CM | POA: Diagnosis not present

## 2024-05-31 DIAGNOSIS — G309 Alzheimer's disease, unspecified: Secondary | ICD-10-CM | POA: Diagnosis not present

## 2024-07-11 ENCOUNTER — Telehealth: Payer: Self-pay

## 2024-07-11 NOTE — Telephone Encounter (Signed)
 Pt daughter called in stating she received a msg from [employer] about pt monitor. I let her know to disregard as the pt is in hospice and no longer needs monitoring

## 2024-07-26 DIAGNOSIS — G309 Alzheimer's disease, unspecified: Secondary | ICD-10-CM | POA: Diagnosis not present

## 2024-08-30 ENCOUNTER — Telehealth: Payer: Self-pay

## 2024-08-30 NOTE — Telephone Encounter (Signed)
 Patient is overdue for an appointment. LMOM for patient to call and schedule.

## 2024-09-07 NOTE — Telephone Encounter (Signed)
 Reviewed chart notes: per cardiology, patient is in hospice.   Contacted patient dtr, Rosaline to confirm if patient is still in Hospice. Rosaline confirmed that patient is still in Hospice. No need for pcp visit.
